# Patient Record
Sex: Female | Born: 1965
Health system: Southern US, Community
[De-identification: ages and names within clinical notes are randomized; demographics above are authoritative.]

## PROBLEM LIST (undated history)

## (undated) ENCOUNTER — Encounter: Attending: Hematology & Oncology | Primary: Hematology & Oncology

## (undated) ENCOUNTER — Encounter

## (undated) ENCOUNTER — Encounter: Attending: Internal Medicine | Primary: Internal Medicine

## (undated) ENCOUNTER — Encounter: Attending: Obstetrics & Gynecology | Primary: Obstetrics & Gynecology

## (undated) ENCOUNTER — Telehealth

## (undated) ENCOUNTER — Encounter: Attending: Rheumatology | Primary: Rheumatology

## (undated) ENCOUNTER — Telehealth: Attending: Hematology & Oncology | Primary: Hematology & Oncology

## (undated) ENCOUNTER — Telehealth: Attending: Obstetrics & Gynecology | Primary: Obstetrics & Gynecology

## (undated) ENCOUNTER — Encounter: Attending: Adult Health | Primary: Adult Health

## (undated) ENCOUNTER — Encounter: Payer: MEDICARE | Attending: Pharmacist | Primary: Pharmacist

## (undated) ENCOUNTER — Encounter: Attending: Cardiovascular Disease | Primary: Cardiovascular Disease

## (undated) ENCOUNTER — Telehealth: Attending: Adult Health | Primary: Adult Health

## (undated) ENCOUNTER — Ambulatory Visit: Payer: MEDICARE

## (undated) ENCOUNTER — Encounter: Payer: MEDICARE | Attending: Hematology & Oncology | Primary: Hematology & Oncology

## (undated) ENCOUNTER — Encounter: Attending: Pharmacist | Primary: Pharmacist

## (undated) ENCOUNTER — Encounter: Payer: MEDICARE | Attending: Internal Medicine | Primary: Internal Medicine

## (undated) ENCOUNTER — Telehealth: Attending: Rheumatology | Primary: Rheumatology

## (undated) ENCOUNTER — Ambulatory Visit

## (undated) ENCOUNTER — Encounter: Payer: MEDICARE | Attending: Adult Health | Primary: Adult Health

## (undated) ENCOUNTER — Ambulatory Visit: Payer: MEDICARE | Attending: Internal Medicine | Primary: Internal Medicine

## (undated) ENCOUNTER — Encounter: Payer: MEDICARE | Attending: Physical Medicine & Rehabilitation | Primary: Physical Medicine & Rehabilitation

## (undated) ENCOUNTER — Encounter: Attending: Oncology | Primary: Oncology

## (undated) ENCOUNTER — Ambulatory Visit: Attending: Obstetrics & Gynecology | Primary: Obstetrics & Gynecology

## (undated) ENCOUNTER — Encounter: Attending: Nurse Practitioner | Primary: Nurse Practitioner

## (undated) ENCOUNTER — Ambulatory Visit: Payer: MEDICARE | Attending: Hematology & Oncology | Primary: Hematology & Oncology

## (undated) ENCOUNTER — Encounter: Payer: MEDICARE | Attending: Obstetrics & Gynecology | Primary: Obstetrics & Gynecology

## (undated) ENCOUNTER — Encounter: Payer: MEDICARE | Attending: Cardiovascular Disease | Primary: Cardiovascular Disease

## (undated) ENCOUNTER — Telehealth: Attending: Pharmacist | Primary: Pharmacist

## (undated) DIAGNOSIS — E049 Nontoxic goiter, unspecified: Secondary | ICD-10-CM

## (undated) DIAGNOSIS — C9590 Leukemia, unspecified not having achieved remission: Secondary | ICD-10-CM

## (undated) DIAGNOSIS — Z8619 Personal history of other infectious and parasitic diseases: Secondary | ICD-10-CM

## (undated) DIAGNOSIS — K219 Gastro-esophageal reflux disease without esophagitis: Secondary | ICD-10-CM

## (undated) DIAGNOSIS — K279 Peptic ulcer, site unspecified, unspecified as acute or chronic, without hemorrhage or perforation: Secondary | ICD-10-CM

## (undated) DIAGNOSIS — M329 Systemic lupus erythematosus, unspecified: Secondary | ICD-10-CM

## (undated) DIAGNOSIS — L409 Psoriasis, unspecified: Secondary | ICD-10-CM

## (undated) DIAGNOSIS — E119 Type 2 diabetes mellitus without complications: Secondary | ICD-10-CM

## (undated) DIAGNOSIS — M8580 Other specified disorders of bone density and structure, unspecified site: Secondary | ICD-10-CM

## (undated) DIAGNOSIS — C9210 Chronic myeloid leukemia, BCR/ABL-positive, not having achieved remission: Secondary | ICD-10-CM

## (undated) DIAGNOSIS — I10 Essential (primary) hypertension: Secondary | ICD-10-CM

## (undated) DIAGNOSIS — M069 Rheumatoid arthritis, unspecified: Secondary | ICD-10-CM

## (undated) HISTORY — DX: Psoriasis, unspecified: L40.9

## (undated) HISTORY — PX: BOTOX INJECTION: SHX5754

## (undated) HISTORY — DX: Rheumatoid arthritis, unspecified: M06.9

## (undated) HISTORY — DX: Type 2 diabetes mellitus without complications: E11.9

## (undated) HISTORY — DX: Nontoxic goiter, unspecified: E04.9

## (undated) HISTORY — DX: Systemic lupus erythematosus, unspecified: M32.9

## (undated) HISTORY — DX: Chronic myeloid leukemia, BCR/ABL-positive, not having achieved remission: C92.10

## (undated) HISTORY — DX: Other specified disorders of bone density and structure, unspecified site: M85.80

## (undated) HISTORY — DX: Gastro-esophageal reflux disease without esophagitis: K21.9

## (undated) HISTORY — DX: Essential (primary) hypertension: I10

## (undated) HISTORY — DX: Peptic ulcer, site unspecified, unspecified as acute or chronic, without hemorrhage or perforation: K27.9

## (undated) HISTORY — DX: Personal history of other infectious and parasitic diseases: Z86.19

## (undated) MED ORDER — CONJUGATED ESTROGENS 0.625 MG/GRAM VAGINAL CREAM: VAGINAL | 0 days | Status: SS

## (undated) MED ORDER — DOXYCYCLINE MONOHYDRATE 50 MG CAPSULE: Freq: Two times a day (BID) | ORAL | 0 days | Status: SS

## (undated) MED ORDER — TACROLIMUS 0.03 % TOPICAL OINTMENT: Freq: Two times a day (BID) | TOPICAL | 0 days | Status: SS

## (undated) MED ORDER — TRAMADOL 50 MG TABLET: Freq: Four times a day (QID) | ORAL | 0 days | Status: SS

## (undated) MED ORDER — KETOCONAZOLE 2 % SHAMPOO: TOPICAL | 0 days | Status: SS

## (undated) MED ORDER — TRIAMCINOLONE ACETONIDE 0.1 % TOPICAL OINTMENT: Freq: Two times a day (BID) | TOPICAL | 0 days | Status: SS

## (undated) MED ORDER — MEDROXYPROGESTERONE 10 MG TABLET: Freq: Every day | ORAL | 0 days | Status: SS

## (undated) MED ORDER — ALBUTEROL SULFATE HFA 90 MCG/ACTUATION AEROSOL INHALER: Freq: Four times a day (QID) | RESPIRATORY_TRACT | 0 days | Status: SS | PRN

---
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
## 1988-02-21 DIAGNOSIS — Z8619 Personal history of other infectious and parasitic diseases: Secondary | ICD-10-CM

## 1988-02-21 HISTORY — DX: Personal history of other infectious and parasitic diseases: Z86.19

## 2005-04-13 ENCOUNTER — Emergency Department: Payer: Self-pay | Admitting: Emergency Medicine

## 2005-04-14 ENCOUNTER — Other Ambulatory Visit: Payer: Self-pay

## 2006-02-20 HISTORY — PX: MYOMECTOMY: SHX85

## 2006-05-07 ENCOUNTER — Ambulatory Visit: Payer: Self-pay | Admitting: Unknown Physician Specialty

## 2006-05-10 ENCOUNTER — Ambulatory Visit: Payer: Self-pay | Admitting: Unknown Physician Specialty

## 2008-02-21 HISTORY — PX: OTHER SURGICAL HISTORY: SHX169

## 2008-05-31 ENCOUNTER — Emergency Department: Payer: Self-pay | Admitting: Emergency Medicine

## 2008-07-03 LAB — HM DEXA SCAN

## 2008-07-03 LAB — HM MAMMOGRAPHY

## 2008-11-25 ENCOUNTER — Emergency Department: Payer: Self-pay | Admitting: Emergency Medicine

## 2008-12-02 ENCOUNTER — Emergency Department: Payer: Self-pay

## 2008-12-29 ENCOUNTER — Ambulatory Visit: Payer: Self-pay | Admitting: Family Medicine

## 2009-02-20 DIAGNOSIS — C9210 Chronic myeloid leukemia, BCR/ABL-positive, not having achieved remission: Secondary | ICD-10-CM

## 2009-02-20 HISTORY — PX: OTHER SURGICAL HISTORY: SHX169

## 2009-02-20 HISTORY — DX: Chronic myeloid leukemia, BCR/ABL-positive, not having achieved remission: C92.10

## 2009-09-20 DIAGNOSIS — M329 Systemic lupus erythematosus, unspecified: Secondary | ICD-10-CM

## 2009-09-20 HISTORY — DX: Systemic lupus erythematosus, unspecified: M32.9

## 2009-10-14 ENCOUNTER — Encounter: Payer: Self-pay | Admitting: Family Medicine

## 2009-10-14 LAB — CONVERTED CEMR LAB
ALT: 28 units/L
AST: 24 units/L
Alkaline Phosphatase: 68 units/L
BUN: 7 mg/dL
CO2: 25 meq/L
Chloride: 112 meq/L
Creatinine, Ser: 0.87 mg/dL
HCT: 43.2 %
Hemoglobin: 13.6 g/dL
MCV: 74 fL
Platelets: 782 10*3/uL
Potassium: 5.1 meq/L
RBC: 5.81 M/uL
Sodium: 144 meq/L
Vit D, 25-Hydroxy: 35 ng/mL
WBC: 13.7 10*3/uL

## 2009-11-20 ENCOUNTER — Ambulatory Visit: Payer: Self-pay | Admitting: Internal Medicine

## 2009-12-16 ENCOUNTER — Ambulatory Visit: Payer: Self-pay | Admitting: Oncology

## 2009-12-21 ENCOUNTER — Ambulatory Visit: Payer: Self-pay | Admitting: Internal Medicine

## 2009-12-21 ENCOUNTER — Ambulatory Visit: Payer: Self-pay | Admitting: Oncology

## 2009-12-24 ENCOUNTER — Emergency Department: Payer: Self-pay | Admitting: Emergency Medicine

## 2010-01-20 ENCOUNTER — Ambulatory Visit: Payer: Self-pay | Admitting: Oncology

## 2010-01-20 ENCOUNTER — Ambulatory Visit: Payer: Self-pay | Admitting: Internal Medicine

## 2010-02-09 ENCOUNTER — Encounter: Payer: Self-pay | Admitting: Family Medicine

## 2010-02-20 ENCOUNTER — Ambulatory Visit: Payer: Self-pay | Admitting: Internal Medicine

## 2010-02-20 ENCOUNTER — Ambulatory Visit: Payer: Self-pay | Admitting: Oncology

## 2010-02-20 HISTORY — PX: TRANSTHORACIC ECHOCARDIOGRAM: SHX275

## 2010-03-23 ENCOUNTER — Ambulatory Visit: Payer: Self-pay | Admitting: Oncology

## 2010-03-23 ENCOUNTER — Encounter: Payer: Self-pay | Admitting: Family Medicine

## 2010-03-23 ENCOUNTER — Ambulatory Visit: Payer: Self-pay | Admitting: Internal Medicine

## 2010-03-23 LAB — CONVERTED CEMR LAB
ALT: 49 units/L
AST: 19 units/L
Albumin: 3.6 g/dL
Alkaline Phosphatase: 98 units/L
BUN: 14 mg/dL
CO2: 22 meq/L
Calcium: 8.9 mg/dL
Chloride: 104 meq/L
Creatinine, Ser: 0.9 mg/dL
Glucose, Bld: 95 mg/dL
HCT: 39.8 %
Hemoglobin: 12.7 g/dL
MCV: 76 fL
Platelets: 472 10*3/uL
Potassium: 4.5 meq/L
Sodium: 139 meq/L
Total Bilirubin: 0.1 mg/dL
Total Protein: 7.2 g/dL
WBC: 9 10*3/uL

## 2010-03-24 ENCOUNTER — Encounter: Payer: Self-pay | Admitting: Family Medicine

## 2010-03-24 ENCOUNTER — Ambulatory Visit (INDEPENDENT_AMBULATORY_CARE_PROVIDER_SITE_OTHER): Payer: PRIVATE HEALTH INSURANCE | Admitting: Family Medicine

## 2010-03-24 DIAGNOSIS — M069 Rheumatoid arthritis, unspecified: Secondary | ICD-10-CM | POA: Insufficient documentation

## 2010-03-24 DIAGNOSIS — C9210 Chronic myeloid leukemia, BCR/ABL-positive, not having achieved remission: Secondary | ICD-10-CM | POA: Insufficient documentation

## 2010-03-24 DIAGNOSIS — J45909 Unspecified asthma, uncomplicated: Secondary | ICD-10-CM

## 2010-03-24 DIAGNOSIS — R143 Flatulence: Secondary | ICD-10-CM

## 2010-03-24 DIAGNOSIS — R142 Eructation: Secondary | ICD-10-CM

## 2010-03-24 DIAGNOSIS — M329 Systemic lupus erythematosus, unspecified: Secondary | ICD-10-CM | POA: Insufficient documentation

## 2010-03-24 DIAGNOSIS — R141 Gas pain: Secondary | ICD-10-CM

## 2010-03-24 DIAGNOSIS — K219 Gastro-esophageal reflux disease without esophagitis: Secondary | ICD-10-CM | POA: Insufficient documentation

## 2010-03-30 NOTE — Assessment & Plan Note (Signed)
Summary: NEW PT TO EST/45 MIN/CLE [facility] MAILED NPP   Vital Signs:  Patient profile:   45 year old female Height:      65 inches Weight:      228.25 pounds BMI:     38.12 Temp:     98.2 degrees F oral Pulse rate:   72 / minute Pulse rhythm:   regular BP sitting:   144 / 84  (left arm) Cuff size:   large  Vitals Entered By: Selena Batten Dance CMA Duncan Dull) (March 24, 2010 10:32 AM) CC: New patient to establish care   History of Present Illness: CC: new patient, establish  Dr. Burnett Sheng at [facility] previous PCP.  wanted switch 2/2 all issues she's going through with medical problems.  concern today - keeping gas/bloating, feel like bubbles.  Tums and protonix daily not helping.  Has tried gasx which didn't help.  wonders if could be due to dairy ashas recently increased milk consumption corerlates with gassiness.  h/o RA, lupus, and CML about to change to gleevec and on predisone.  SLE not well controlled currently.  Knee pain.  To see Rheum 2/23 to discuss furhter treatments.  saw onc yesterday, about to change tusigna to gleevec.    told HTN due to pain, not really HTNive.  Sees Rheum, [facility], GYN  Preventative: tetanus - thinks >10 years ago, unsure. flu shot - had at [facility] Pap - 06/2009 at GYN, had low risk HPV, rpt 1 year. mammo - normal 07/2009, gets yearly.  Current Medications (verified): 1)  Lybrel 90-20 Mcg Tabs (Levonorgestrel-Ethinyl Estrad) .Marland Kitchen.. 1 By Mouth Once Daily 2)  Amiloride Hcl 5 Mg Tabs (Amiloride Hcl) .... 10mg  By Mouth Once Daily 3)  Pantoprazole Sodium 40 Mg Tbec (Pantoprazole Sodium) .Marland Kitchen.. 1 By Mouth Once Daily 4)  Generlac 10 Gm/56ml Soln (Lactulose Encephalopathy) .Marland KitchenMarland KitchenMarland Kitchen 15-30 Ml By Mouth At Bedtime 5)  Prednisone 20 Mg Tabs (Prednisone) .Marland Kitchen.. 1 By Mouth Once Daily 6)  Oxycodone Hcl 5 Mg Tabs (Oxycodone Hcl) .Marland Kitchen.. 1 By Mouth Every 4-6 As Needed For Pain 7)  Prochlorperazine Maleate 10 Mg Tabs (Prochlorperazine Maleate) .Marland Kitchen.. 1 By Mouth For  Nausea 8)  Vitamin D 1000 Unit Tabs (Cholecalciferol) .Marland Kitchen.. 1 By Mouth Once Daily 9)  Calcium 1200 1200-1000 Mg-Unit Chew (Calcium Carbonate-Vit D-Min) .Marland Kitchen.. 1 By Mouth Once Daily 10)  Ventolin Hfa 108 (90 Base) Mcg/act Aers (Albuterol Sulfate) .... 2 Puffs Q4 Hours As Needed Sob  Allergies (verified): 1)  ! Sulfa  Past History:  Past Medical History: Rheumatoid arthritis (1990s) Lupus (09/2009) CML (11/2009) currently undergoing chemotherapy Asthma GERD/PUD Thyroid goiter h/o syphilis s/p treatment (1990)  Rheum - Dr. Higinio Plan Onc - Dr. Orlie Dakin [facility] GYN - Dr. Genice Rouge Endo - Dr. 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  The patient denies anorexia, weight loss, decreased hearing, hoarseness, chest pain, syncope, dyspnea on exertion, prolonged cough, headaches, hemoptysis, abdominal pain, melena, hematochezia, hematuria, depression, and breast masses.         low grade fever part of rheum conditions  per pt. to see eye doc today. just had echo done, told ok.  Physical Exam  General:  Well-developed,well-nourished,in no acute distress; alert,appropriate and cooperative throughout examination Head:  Normocephalic and atraumatic without obvious abnormalities. No apparent alopecia or balding. Eyes:  PERRLA, EOMI, no injection Ears:  TMs clear bilaterally Nose:  nares clear bilaterally Mouth:  MMM, no pharyngeal erythema/edema Neck:  No deformities, masses, or tenderness noted.  no LAD appreciated Lungs:  Normal respiratory effort, chest expands  symmetrically. Lungs are clear to auscultation, no crackles or wheezes. Heart:  Normal rate and regular rhythm. S1 and S2 normal without gallop, murmur, click, rub or other extra sounds. Abdomen:  Bowel sounds positive,abdomen soft and non-tender without masses, organomegaly or hernias noted. Msk:  bilateral hands with boutonniere deformity, no warmth or pain of IPs Pulses:  2+ rad pulses Extremities:  no pedal edema Neurologic:  CN grossly intact.  slowed and antalgic gait.  station intact Skin:  Intact without suspicious lesions or rashes Psych:  full affect, pleasant and cooperative   Impression & Recommendations:  Problem # 1:  FLATULENCE ERUCTATION AND GAS PAIN (ICD-787.3) could very well be dairy products.  rec try beano first, if not better may need to slowly decrease amt dairy in diet.  already on calcium/vit D supplementation daily.  Problem # 2:  CML (ICD-205.10) requested records from previous provider as well as onc.  Problem # 3:  GERD (ICD-530.81)  continue current therapy. Her updated medication list for this problem includes:    Pantoprazole Sodium 40 Mg Tbec (Pantoprazole sodium) .Marland Kitchen... 1 by mouth once daily  Problem # 4:  ARTHRITIS, RHEUMATOID (ICD-714.0) requested records from rheum. Her updated medication list for this problem includes:    Prednisone 20 Mg Tabs (Prednisone) .Marland Kitchen... 1 by mouth once daily  Problem # 5:  SYSTEMIC LUPUS ERYTHEMATOSUS (ICD-710.0) requested records from rheum. Her updated medication list for this problem includes:    Prednisone 20 Mg Tabs (Prednisone) .Marland Kitchen... 1 by mouth once daily  Problem # 6:  ASTHMA (ICD-493.90) controlled on albuterol as needed, no controller med currently Her updated medication list for this problem includes:    Prednisone 20 Mg Tabs (Prednisone) .Marland Kitchen... 1 by mouth once daily    Ventolin Hfa 108 (90 Base) Mcg/act Aers (Albuterol sulfate) .Marland Kitchen... 2 puffs q4 hours as needed sob  Complete Medication List: 1)  Lybrel  90-20 Mcg Tabs (Levonorgestrel-ethinyl estrad) .Marland Kitchen.. 1 by mouth once daily 2)  Amiloride Hcl 5 Mg Tabs (Amiloride hcl) .... 10mg  by mouth once daily 3)  Pantoprazole Sodium 40 Mg Tbec (Pantoprazole sodium) .Marland Kitchen.. 1 by mouth once daily 4)  Generlac 10 Gm/61ml Soln (Lactulose encephalopathy) .Marland KitchenMarland KitchenMarland Kitchen 15-30 ml by mouth at bedtime 5)  Prednisone 20 Mg Tabs (Prednisone) .Marland Kitchen.. 1 by mouth once daily 6)  Oxycodone Hcl 5 Mg Tabs (Oxycodone hcl) .Marland Kitchen.. 1 by mouth every 4-6 as needed for pain 7)  Prochlorperazine Maleate 10 Mg Tabs (Prochlorperazine maleate) .Marland Kitchen.. 1 by mouth for nausea 8)  Vitamin D 1000 Unit Tabs (Cholecalciferol) .Marland Kitchen.. 1 by mouth once daily 9)  Calcium 1200 1200-1000 Mg-unit Chew (Calcium carbonate-vit d-min) .Marland Kitchen.. 1 by mouth once daily 10)  Ventolin Hfa 108 (90 Base) Mcg/act Aers (Albuterol sulfate) .... 2 puffs q4 hours as needed sob  Patient Instructions: 1)  Try beano for bloating/gas.  may be milk and may need to back down on this. 2)  We will get records from other doctors to review. 3)  Return as needed or  in 1 year for complete physical. 4)  good to meet you today, call clinic with questions.   Orders Added: 1)  New Patient Level III [04540]    Current Allergies (reviewed today): ! SULFA

## 2010-04-10 ENCOUNTER — Encounter: Payer: Self-pay | Admitting: Family Medicine

## 2010-04-19 NOTE — Miscellaneous (Signed)
Summary: new pt input  Clinical Lists Changes  Observations: Added new observation of PAST SURG HX: Fibroid removed 2007  echo - normal systolic/diastolic fxn, EF 04%, mild MR, normal PA pressures 02/2010 (04/10/2010 13:37) Added new observation of PLATELETK/UL: 472 K/uL (03/23/2010 13:37) Added new observation of MCV: 76 fL (03/23/2010 13:37) Added new observation of HCT: 39.8 % (03/23/2010 13:37) Added new observation of HGB: 12.7 g/dL (54/10/8117 14:78) Added new observation of WBC COUNT: 9.0 10*3/microliter (03/23/2010 13:37) Added new observation of CALCIUM: 8.9 mg/dL (29/56/2130 86:57) Added new observation of ALBUMIN: 3.6 g/dL (84/69/6295 28:41) Added new observation of PROTEIN, TOT: 7.2 g/dL (32/44/0102 72:53) Added new observation of SGPT (ALT): 49 units/L (03/23/2010 13:37) Added new observation of SGOT (AST): 19 units/L (03/23/2010 13:37) Added new observation of ALK PHOS: 98 units/L (03/23/2010 13:37) Added new observation of BILI TOTAL: 0.1 mg/dL (66/44/0347 42:59) Added new observation of CREATININE: 0.90 mg/dL (56/38/7564 33:29) Added new observation of BUN: 14 mg/dL (51/88/4166 06:30) Added new observation of BG RANDOM: 95 mg/dL (16/02/930 35:57) Added new observation of CO2 PLSM/SER: 22 meq/L (03/23/2010 13:37) Added new observation of CL SERUM: 104 meq/L (03/23/2010 13:37) Added new observation of K SERUM: 4.5 meq/L (03/23/2010 13:37) Added new observation of NA: 139 meq/L (03/23/2010 13:37)        Past History:  Past Surgical History: Fibroid removed 2007  echo - normal systolic/diastolic fxn, EF 32%, mild MR, normal PA pressures 02/2010

## 2010-04-21 ENCOUNTER — Ambulatory Visit: Payer: Self-pay | Admitting: Oncology

## 2010-04-21 ENCOUNTER — Ambulatory Visit: Payer: Self-pay | Admitting: Internal Medicine

## 2010-05-01 ENCOUNTER — Encounter: Payer: Self-pay | Admitting: Family Medicine

## 2010-05-11 DIAGNOSIS — M1710 Unilateral primary osteoarthritis, unspecified knee: Secondary | ICD-10-CM | POA: Insufficient documentation

## 2010-05-22 ENCOUNTER — Ambulatory Visit: Payer: Self-pay | Admitting: Internal Medicine

## 2010-05-22 ENCOUNTER — Ambulatory Visit: Payer: Self-pay | Admitting: Oncology

## 2010-05-22 HISTORY — PX: OTHER SURGICAL HISTORY: SHX169

## 2010-05-31 ENCOUNTER — Encounter: Payer: Self-pay | Admitting: Family Medicine

## 2010-05-31 LAB — LIPID PANEL
Cholesterol, Total: 187
HDL: 71 mg/dL — AB (ref 35–70)
LDL Cholesterol: 103 mg/dL
Triglycerides: 66

## 2010-06-01 ENCOUNTER — Encounter: Payer: Self-pay | Admitting: Family Medicine

## 2010-06-06 ENCOUNTER — Encounter: Payer: Self-pay | Admitting: Family Medicine

## 2010-06-07 ENCOUNTER — Encounter: Payer: Self-pay | Admitting: Family Medicine

## 2010-06-07 ENCOUNTER — Ambulatory Visit (INDEPENDENT_AMBULATORY_CARE_PROVIDER_SITE_OTHER): Payer: PRIVATE HEALTH INSURANCE | Admitting: Family Medicine

## 2010-06-07 DIAGNOSIS — F17200 Nicotine dependence, unspecified, uncomplicated: Secondary | ICD-10-CM | POA: Insufficient documentation

## 2010-06-07 DIAGNOSIS — Z720 Tobacco use: Secondary | ICD-10-CM | POA: Insufficient documentation

## 2010-06-07 DIAGNOSIS — R21 Rash and other nonspecific skin eruption: Secondary | ICD-10-CM

## 2010-06-07 DIAGNOSIS — R609 Edema, unspecified: Secondary | ICD-10-CM

## 2010-06-07 MED ORDER — CLOTRIMAZOLE 1 % EX CREA: TOPICAL_CREAM | Freq: Two times a day (BID) | CUTANEOUS | Status: DC

## 2010-06-07 MED ORDER — AMILORIDE HCL 5 MG PO TABS: 5.0000 mg | ORAL_TABLET | Freq: Two times a day (BID) | ORAL | Status: DC

## 2010-06-07 NOTE — Assessment & Plan Note (Signed)
Not interested in chantix, wellbutrin currently. Nicotine gum caused cough. Discussed sandwich bag method, pt seems motivated ot try this.

## 2010-06-07 NOTE — Progress Notes (Signed)
  Subjective:    Patient ID: Laura Chen, female    DOB: 1965/05/15, 45 y.o.   MRN: [id number]  HPI CC: here for 2 concerns.  1. Amiloride not working, takes twice daily.  States voiding equally with or without.  Feels like is retaining fluid all over, especially around middle.  Notes chnages 5 lbs in one day.  No SOB, leg swelling, chest pain/tightness, HA, vision changes, dizziness.  2. Skin spot check - bilateral abdomen, noticed for several weeks.  Dr. Sueanne Margarita unsure what it is.  Also separate rash on abdomen peeling which is improving on 50mg  prednisone.  Recently treated with diflucan x1.  Recent increase in prednisone to 50mg .  3. Smoking - 1/4 ppd.  Doesn't want chantix, doesn't want wellbutrin.  Has tried gum - made her cough.  Has not tried patch or lozenge.  Feels depressed.  Has put on significant amout of weight.    Stopped gleevec because of side effects.  Medications and allergies reviewed and updated as above. PMHx reviewed for relevance.  Review of Systems Per HPI    Objective:   Physical Exam  Constitutional: She appears well-developed and well-nourished. No distress.  HENT:  Head: Normocephalic and atraumatic.  Mouth/Throat: Oropharynx is clear and moist. No oropharyngeal exudate.  Eyes: Conjunctivae are normal. Pupils are equal, round, and reactive to light. No scleral icterus.  Neck: Normal range of motion. Neck supple.  Cardiovascular: Normal rate, regular rhythm, normal heart sounds and intact distal pulses.   No murmur heard. Pulmonary/Chest: Effort normal and breath sounds normal. No respiratory distress. She has no wheezes. She has no rales.  Abdominal: Soft. Bowel sounds are normal. She exhibits no distension and no mass. There is no tenderness. There is no rebound and no guarding.  Musculoskeletal: She exhibits no edema (no pedal edema bilaterally).  Lymphadenopathy:    She has no cervical adenopathy.  Skin: Skin is warm and dry. Rash (scaly rash mid  abdomen at beltline.  2nd rash on bilateral upper abdomen, annular about 2cm diameter, raised papules at  periphery and central clearing, mild scale) noted.          Assessment & Plan:

## 2010-06-07 NOTE — Assessment & Plan Note (Signed)
Seem like 2 separate rashes. One on upper abdomen possible tinea corporis, treat with clotrimazole (recently had diflucan x 1, should have helped). If not better, let us know.

## 2010-06-07 NOTE — Assessment & Plan Note (Signed)
Actually seems more due to obesity as no significant edema on exam - no pedal edema, no UE or periorbital edema. Discussed low salt, increase water. No change to diuretic currently (amiloride because sulfa allergy so no HCTZ).  Discussed lasix option, would want to hold off on this med for now, consider prn dosing in future.

## 2010-06-07 NOTE — Patient Instructions (Addendum)
Push water and try to limit salt. Try to incorporate more walking into the routine. If not getting better, let me know. Try cream for skin rash on abdomen, may be a bit of ringworm. Try sandwich bag method for cutting back slowly on smoking. Good to see you today.  Call us with questions.

## 2010-06-21 ENCOUNTER — Ambulatory Visit: Payer: Self-pay | Admitting: Oncology

## 2010-06-21 ENCOUNTER — Ambulatory Visit: Payer: Self-pay | Admitting: Internal Medicine

## 2010-06-23 ENCOUNTER — Encounter: Payer: Self-pay | Admitting: Family Medicine

## 2010-06-23 ENCOUNTER — Ambulatory Visit (INDEPENDENT_AMBULATORY_CARE_PROVIDER_SITE_OTHER): Payer: PRIVATE HEALTH INSURANCE | Admitting: Family Medicine

## 2010-06-23 DIAGNOSIS — R5383 Other fatigue: Secondary | ICD-10-CM

## 2010-06-23 DIAGNOSIS — R21 Rash and other nonspecific skin eruption: Secondary | ICD-10-CM

## 2010-06-23 DIAGNOSIS — C9210 Chronic myeloid leukemia, BCR/ABL-positive, not having achieved remission: Secondary | ICD-10-CM

## 2010-06-23 DIAGNOSIS — M791 Myalgia, unspecified site: Secondary | ICD-10-CM

## 2010-06-23 DIAGNOSIS — L299 Pruritus, unspecified: Secondary | ICD-10-CM

## 2010-06-23 DIAGNOSIS — IMO0001 Reserved for inherently not codable concepts without codable children: Secondary | ICD-10-CM

## 2010-06-23 DIAGNOSIS — R5381 Other malaise: Secondary | ICD-10-CM

## 2010-06-23 LAB — COMPREHENSIVE METABOLIC PANEL
ALT: 28 U/L (ref 0–35)
AST: 24 U/L (ref 0–37)
Albumin: 3.7 g/dL (ref 3.5–5.2)
Alkaline Phosphatase: 63 U/L (ref 39–117)
BUN: 10 mg/dL (ref 6–23)
CO2: 23 mEq/L (ref 19–32)
Calcium: 8.9 mg/dL (ref 8.4–10.5)
Chloride: 105 mEq/L (ref 96–112)
Creatinine, Ser: 0.8 mg/dL (ref 0.4–1.2)
GFR: 103.85 mL/min (ref >60.00)
Glucose, Bld: 111 mg/dL — ABNORMAL HIGH (ref 70–99)
Potassium: 4.5 mEq/L (ref 3.5–5.1)
Sodium: 138 mEq/L (ref 135–145)
Total Bilirubin: 0.2 mg/dL — ABNORMAL LOW (ref 0.3–1.2)
Total Protein: 6.5 g/dL (ref 6.0–8.3)

## 2010-06-23 LAB — CBC WITH DIFFERENTIAL/PLATELET
Basophils Absolute: 0 10*3/uL (ref 0.0–0.1)
Basophils Relative: 0.3 % (ref 0.0–3.0)
Eosinophils Absolute: 0 10*3/uL (ref 0.0–0.7)
Eosinophils Relative: 0.5 % (ref 0.0–5.0)
HCT: 35 % — ABNORMAL LOW (ref 36.0–46.0)
Hemoglobin: 11.5 g/dL — ABNORMAL LOW (ref 12.0–15.0)
Lymphocytes Relative: 14.1 % (ref 12.0–46.0)
Lymphs Abs: 1.2 10*3/uL (ref 0.7–4.0)
MCHC: 32.8 g/dL (ref 30.0–36.0)
MCV: 78.4 fl (ref 78.0–100.0)
Monocytes Absolute: 0.1 10*3/uL (ref 0.1–1.0)
Monocytes Relative: 1.6 % — ABNORMAL LOW (ref 3.0–12.0)
Neutro Abs: 6.9 10*3/uL (ref 1.4–7.7)
Neutrophils Relative %: 83.5 % — ABNORMAL HIGH (ref 43.0–77.0)
Platelets: 356 10*3/uL (ref 150.0–400.0)
RBC: 4.47 Mil/uL (ref 3.87–5.11)
RDW: 23.9 % — ABNORMAL HIGH (ref 11.5–14.6)
WBC: 8.2 10*3/uL (ref 4.5–10.5)

## 2010-06-23 LAB — TSH: TSH: 0.44 u[IU]/mL (ref 0.35–5.50)

## 2010-06-23 LAB — VITAMIN B12: Vitamin B-12: 452 pg/mL (ref 211–911)

## 2010-06-23 LAB — CK: Total CK: 140 U/L (ref 7–177)

## 2010-06-23 MED ORDER — CLOTRIMAZOLE-BETAMETHASONE 1-0.05 % EX CREA: TOPICAL_CREAM | Freq: Two times a day (BID) | CUTANEOUS | Status: DC

## 2010-06-23 NOTE — Assessment & Plan Note (Signed)
Doubt due to lyme disease as no noted tick per pt report, occurred 8 months ago. Check titers, if positive could be accounting for some of her sxs, would treat w doxy x 21d. Check other blood work as well.

## 2010-06-23 NOTE — Patient Instructions (Signed)
For skin rash - try lotrisone twice daily for 2 wks.  Give me an update if cream not helping skin rash.  For hands - continue moisturizing with curelle. I don't think you have lyme disease.   Blood work today to check on things. We will call you with results of blood work.

## 2010-06-23 NOTE — Progress Notes (Signed)
  Subjective:    Patient ID: Laura Chen, female    DOB: 1965/06/03, 45 y.o.   MRN: [id number]  HPI CC: check tick bite.  Last year 10/2009 thinks had tick bite.  Happened R posterior leg then lesion developed with rash around bite.  Actually only noticed red spot after itching/burning started.  No tick seen.  Co worker said looked like tick bite.    Worried about Lyme disease because since then doesn't feel well.  Gets fatigue spells.  Happening more frequently.  Previously about 2-3 x/mo, now 1-2 x/wk.  From awakening to bedtime (which is earlier than usual) feels very fatigued.  Episodes last from a few hours to 1 day.    Not due to gleevec because hasn't been on it in over a month.  Feeling nauseated every morning.  On loestrin daily.  + chills (99.9).  + joint pain.  + muscle pain (new).  + HA (more frequently).  + body itching throughout.  No fevers, abd pain.  No recent tick bites.  Thinks March blood work done at onc, told looking ok.  Skin rash - present for several months now.  On prednisone for years, but recently went down to 10mg  (05/2010) for RA and SLE (was up to 50mg  a few months back which seemed to help rash).  Rash noticed on back, underarms, patches on abd and dry skin on hands.  Initially treated with 2 wk course clotrimazole as though some tinea corporis, and abd rash initially scaly, now just hyperpigmented.  Continues pruritic.  Medications and allergies reviewed and updated as above. PMHx reviewed for relevance.   Review of Systems Per HPI    Objective:   Physical Exam  Constitutional: She appears well-developed and well-nourished. No distress.  HENT:  Head: Normocephalic and atraumatic.  Mouth/Throat: Oropharynx is clear and moist. No oropharyngeal exudate.  Eyes: Conjunctivae and EOM are normal. Pupils are equal, round, and reactive to light. No scleral icterus.  Neck: Normal range of motion. Neck supple.  Cardiovascular: Normal rate, regular rhythm, normal  heart sounds and intact distal pulses.   No murmur heard. Pulmonary/Chest: Effort normal and breath sounds normal. No respiratory distress. She has no wheezes. She has no rales.  Lymphadenopathy:    She has no cervical adenopathy.  Skin: Skin is warm and dry.       Dry patches dorsal hands at MCP joints and some on interdigital webs, hyperpigmented erythematous rash underarms and inferior to breasts, hyperpigmented pruritic macule R abdomen (no longer scaly), back with central hyperpigmented pruritic macule.  All are pruritic.  R posterior calf with nevus, no rash, no evidence of bite (months ago).  Psychiatric: She has a normal mood and affect.          Assessment & Plan:

## 2010-06-23 NOTE — Assessment & Plan Note (Addendum)
Below breasts pruritic, consistent with intertrigo, treat with lotrisone for 2 wk max, update me if not improved for referral to derm. On hands ? eczema although as pt on oral steroids would expect improvement.  Continue moisturizer, curelle.

## 2010-06-24 LAB — B. BURGDORFI ANTIBODIES: B burgdorferi Ab IgG+IgM: 0.39 {ISR}

## 2010-06-30 LAB — COMPREHENSIVE METABOLIC PANEL
ALT: 34 U/L (ref 7–35)
AST: 25 U/L
Alkaline Phosphatase: 98 U/L
CO2: 24 mmol/L
Creat: 0.74
Potassium: 4.9 mmol/L

## 2010-06-30 LAB — CBC AND DIFFERENTIAL
Hemoglobin: 11.6 g/dL — AB (ref 12.0–16.0)
WBC: 10.5
platelet count: 383

## 2010-06-30 LAB — VITAMIN D 25 HYDROXY (VIT D DEFICIENCY, FRACTURES): Vit D, 25-Hydroxy: 24

## 2010-06-30 LAB — C-REACTIVE PROTEIN: CRP: 4.7 mg/dL

## 2010-07-06 LAB — LACTATE DEHYDROGENASE: LDH: 893 U/L — AB (ref 80–250)

## 2010-07-22 ENCOUNTER — Ambulatory Visit: Payer: Self-pay | Admitting: Oncology

## 2010-07-22 ENCOUNTER — Ambulatory Visit: Payer: Self-pay | Admitting: Internal Medicine

## 2010-08-21 ENCOUNTER — Ambulatory Visit: Payer: Self-pay | Admitting: Oncology

## 2010-08-21 ENCOUNTER — Ambulatory Visit: Payer: Self-pay | Admitting: Internal Medicine

## 2010-08-29 ENCOUNTER — Emergency Department: Payer: Self-pay | Admitting: Emergency Medicine

## 2010-10-06 ENCOUNTER — Ambulatory Visit: Payer: Self-pay | Admitting: Oncology

## 2010-10-11 LAB — CBC AND DIFFERENTIAL
HCT: 28 %
Hemoglobin: 9.3 g/dL — AB (ref 12.0–16.0)
MCV: 81 fL
RDW: 18.9
WBC: 4.1
platelet count: 106

## 2010-10-11 LAB — COMPREHENSIVE METABOLIC PANEL
ALT: 58 U/L — AB (ref 7–35)
AST: 25 U/L
Albumin: 3.3
Alkaline Phosphatase: 73 U/L
BUN: 6 mg/dL (ref 4–21)
CO2: 25 mmol/L
Calcium: 8 mg/dL
Chloride: 106 mmol/L
Creat: 0.83
Glucose: 150
Potassium: 3.8 mmol/L
Sodium: 140 mmol/L (ref 137–147)
Total Bilirubin: 0.1 mg/dL
Total Protein: 6.8 g/dL

## 2010-10-12 ENCOUNTER — Encounter: Payer: Self-pay | Admitting: Family Medicine

## 2010-10-12 ENCOUNTER — Ambulatory Visit (INDEPENDENT_AMBULATORY_CARE_PROVIDER_SITE_OTHER): Payer: PRIVATE HEALTH INSURANCE | Admitting: Family Medicine

## 2010-10-12 ENCOUNTER — Ambulatory Visit (INDEPENDENT_AMBULATORY_CARE_PROVIDER_SITE_OTHER)
Admission: RE | Admit: 2010-10-12 | Discharge: 2010-10-12 | Disposition: A | Discharge status: Home / Self Care | Payer: PRIVATE HEALTH INSURANCE | Source: Ambulatory Visit | Attending: Family Medicine | Admitting: Family Medicine

## 2010-10-12 VITALS — BP 132/80 | HR 84 | Temp 98.3°F | Wt 235.0 lb

## 2010-10-12 DIAGNOSIS — R109 Unspecified abdominal pain: Secondary | ICD-10-CM

## 2010-10-12 LAB — POCT URINALYSIS DIPSTICK
Bilirubin, UA: NEGATIVE
Glucose, UA: NEGATIVE
Ketones, UA: NEGATIVE
Leukocytes, UA: NEGATIVE
Nitrite, UA: NEGATIVE
Protein, UA: NEGATIVE
Spec Grav, UA: 1.01
Urobilinogen, UA: 0.2
pH, UA: 6

## 2010-10-12 MED ORDER — TAMSULOSIN HCL 0.4 MG PO CAPS: 0.4000 mg | ORAL_CAPSULE | Freq: Every day | ORAL | Status: DC

## 2010-10-12 NOTE — Patient Instructions (Addendum)
I'd like to get records of blood work done yesterday - we will try and obtain.  After I review, may want more blood work or will contact you with what I think. Urine showing some blood.  I would like you to return in 2 weeks to recheck urine.  If not resolved, may set you up with urologist. Will treat for now as possible kidney stone.  I have also sent urine culture to rule out infection. For possible stone - take flomax for 5-7 days, strain urine. Return sooner if getting worse.

## 2010-10-12 NOTE — Progress Notes (Signed)
  Subjective:    Patient ID: Laura Chen, female    DOB: 1965-10-12, 45 y.o.   MRN: [id number]  HPI CC: R sided pain for months, recently worsening.  Pain in flank as well as right side and shooting down into groin.  Sharp pulsating pain.  Started months ago, recently worse this past week.  Hasn't tried anything for this.  Staying well hydrated.  Denies hematuria, dysuria.  + urgency and frequency.  Had some chills initially.  No fevers/chills, nausea/vomiting.  Pain constant, not positional.  When sitting feels pressure.  At its worse 6/10, currently 4/10.  Never had kidney stones.  No dizziness or bleeding from anywhere.  On SpryCell for leukemia, new in last few months. Off OCPs, previously for irregular cycles, LMP 09/25/2009.  Off since 03/2010. No longer has OBGYN, would like to do paps here from now on.  H/o HPV in past, none currently.  Last pap 06/2010 normal, HPV not checked.  Brings copy. To see [facility] Sept 24th, Rheum tomorrow.  Had blood work done yesterday.  Review of Systems Per HPI    Objective:   Physical Exam  Nursing note and vitals reviewed. Constitutional: She appears well-developed and well-nourished. No distress.       Stiff movement from RA  HENT:  Head: Normocephalic and atraumatic.  Mouth/Throat: Oropharynx is clear and moist. No oropharyngeal exudate.  Eyes: Conjunctivae and EOM are normal. Pupils are equal, round, and reactive to light. No scleral icterus.  Neck: Normal range of motion. Neck supple.  Cardiovascular: Normal rate, regular rhythm and intact distal pulses.   Murmur (3/6 SEM) heard. Pulmonary/Chest: Effort normal and breath sounds normal. No respiratory distress. She has no wheezes. She has no rales.  Abdominal: Soft. Bowel sounds are normal. She exhibits no distension and no mass. There is tenderness (mild). There is CVA tenderness (mild R sided). There is no rebound and no guarding.       Mild R sided abd tenderness, no guarding    Musculoskeletal: She exhibits no edema.  Lymphadenopathy:    She has no cervical adenopathy.  Skin: Skin is warm and dry. No rash noted.  Psychiatric: She has a normal mood and affect.          Assessment & Plan:

## 2010-10-12 NOTE — Assessment & Plan Note (Signed)
UA with blood, micro with 3-5 RBC/hpf. ?kidney stone, although atypical if going on for several months now. Will treat as such regardless with continued mobic, oxycodone prn, and add flomax for voiding.  Provided with strainer today. If not improving, return sooner otherwise return in 2 wks for rpt UA to ensure microhematuria resolved. Not positional, doubt MSK.  No RUQ pain.  Doubt hematoma but want to check Hgb. I will review blood work from yesterday and then notify pt if I want any futher labs.  KUB today - no evidence of stone.

## 2010-10-13 ENCOUNTER — Encounter: Payer: Self-pay | Admitting: Family Medicine

## 2010-10-13 ENCOUNTER — Telehealth: Payer: Self-pay | Admitting: Family Medicine

## 2010-10-13 DIAGNOSIS — C9210 Chronic myeloid leukemia, BCR/ABL-positive, not having achieved remission: Secondary | ICD-10-CM

## 2010-10-13 DIAGNOSIS — R109 Unspecified abdominal pain: Secondary | ICD-10-CM

## 2010-10-13 LAB — CBC AND DIFFERENTIAL
HCT: 32 %
Hemoglobin: 9.8 g/dL — AB (ref 12.0–16.0)
MCV: 83 fL
RDW: 19
WBC: 3.8
platelet count: 111

## 2010-10-13 LAB — C3 AND C4
C3 Complement: 122
C4 Complement: 24

## 2010-10-13 LAB — COMPREHENSIVE METABOLIC PANEL
ALT: 50 U/L — AB (ref 7–35)
AST: 29 U/L
Alkaline Phosphatase: 89 U/L
BUN: 9 mg/dL (ref 4–21)
Creat: 0.77

## 2010-10-13 LAB — C-REACTIVE PROTEIN: CRP: 2.7 mg/dL

## 2010-10-13 LAB — SEDIMENTATION RATE: Sed Rate: 36 mm

## 2010-10-13 NOTE — Telephone Encounter (Signed)
CBC, CMP obtained. WBC normal, HGB down at 9.3 from 11.5 3 mo ago, plt down to 106 from 356.    Please call and notify I reviewed blood work from this week.  Normal kidneys, liver.  Ensure eating well as protein level was a bit low.  If fasting sugar too high. Blood counts (red cells and platelets) down some, white count normal.  Could be sprycel - how long has she been on this agent?  Will want to recheck CBC when returns for rpt UA to ensure not dropping lower. How is abd pain doing?  If any worsening in meantime to let me know.

## 2010-10-13 NOTE — Telephone Encounter (Signed)
Called patient and she was at her appointment at [facility]. She will call me back for results. She did ask that I fax results to Dr. Terie Purser at [facility]. Results faxed as requested.

## 2010-10-14 LAB — URINE CULTURE
Colony Count: NO GROWTH
Organism ID, Bacteria: NO GROWTH

## 2010-10-14 NOTE — Telephone Encounter (Signed)
Noted. Thanks.

## 2010-10-14 NOTE — Telephone Encounter (Signed)
Advised pt of results, she has been on sprycel since June.  She states the abd pain is about the same.  Appt for repeat UA has been scheduled.  She will call if abd pain worsens before that lab appt.

## 2010-10-22 ENCOUNTER — Ambulatory Visit: Payer: Self-pay | Admitting: Oncology

## 2010-10-27 ENCOUNTER — Ambulatory Visit (INDEPENDENT_AMBULATORY_CARE_PROVIDER_SITE_OTHER): Payer: PRIVATE HEALTH INSURANCE | Admitting: Family Medicine

## 2010-10-27 ENCOUNTER — Encounter: Payer: Self-pay | Admitting: Family Medicine

## 2010-10-27 DIAGNOSIS — H60399 Other infective otitis externa, unspecified ear: Secondary | ICD-10-CM

## 2010-10-27 DIAGNOSIS — R319 Hematuria, unspecified: Secondary | ICD-10-CM

## 2010-10-27 DIAGNOSIS — R109 Unspecified abdominal pain: Secondary | ICD-10-CM

## 2010-10-27 DIAGNOSIS — N840 Polyp of corpus uteri: Secondary | ICD-10-CM | POA: Insufficient documentation

## 2010-10-27 DIAGNOSIS — D649 Anemia, unspecified: Secondary | ICD-10-CM

## 2010-10-27 DIAGNOSIS — C9210 Chronic myeloid leukemia, BCR/ABL-positive, not having achieved remission: Secondary | ICD-10-CM

## 2010-10-27 DIAGNOSIS — N920 Excessive and frequent menstruation with regular cycle: Secondary | ICD-10-CM | POA: Insufficient documentation

## 2010-10-27 DIAGNOSIS — H6090 Unspecified otitis externa, unspecified ear: Secondary | ICD-10-CM

## 2010-10-27 LAB — POCT URINALYSIS DIPSTICK
Bilirubin, UA: NEGATIVE
Glucose, UA: NEGATIVE
Ketones, UA: NEGATIVE
Leukocytes, UA: NEGATIVE
Nitrite, UA: NEGATIVE
Protein, UA: NEGATIVE
Spec Grav, UA: 1.005
Urobilinogen, UA: 0.2
pH, UA: 7.5

## 2010-10-27 LAB — CBC WITH DIFFERENTIAL/PLATELET
HCT: 27.9 % — ABNORMAL LOW (ref 36.0–46.0)
Hemoglobin: 8.8 g/dL — ABNORMAL LOW (ref 12.0–15.0)
MCHC: 31.7 g/dL (ref 30.0–36.0)
MCV: 82 fl (ref 78.0–100.0)
Platelets: 134 10*3/uL — ABNORMAL LOW (ref 150.0–400.0)
RBC: 3.4 Mil/uL — ABNORMAL LOW (ref 3.87–5.11)
RDW: 21.6 % — ABNORMAL HIGH (ref 11.5–14.6)
WBC: 4.6 10*3/uL (ref 4.5–10.5)

## 2010-10-27 MED ORDER — HYDROCORTISONE-ACETIC ACID 1-2 % OT SOLN: 4.0000 [drp] | Freq: Three times a day (TID) | OTIC | Status: AC

## 2010-10-27 NOTE — Patient Instructions (Addendum)
Urine overall clear today.  Blood work today to check on red cell count. For left ear, could be mild outer ear infection.  Use vosol hc several times a day for 5-7 days.  If not better, let me know. Let me know results of CT scan.  If no explanation for irregular periods, we will want to set you up with local OBGYN to follow irregular periods. Good to see you today, call us with questions.

## 2010-10-27 NOTE — Progress Notes (Signed)
Subjective:    Patient ID: Laura Chen, female    DOB: 10/18/1965, 45 y.o.   MRN: [id number]  HPI CC: f/u R flank pain  Pleasant 44yo with h/o CML followed by Dr. Haydee Monica, Rheumatoid arthritis and SLE followed by Dr. Terie Purser [facility]), smoker presents for 2wk f/u.  Seen 8/22 with progressively worsening R flank pain.  UA with blood, question of kidney stone so treated with flomax, mobic and oxycodone prn, strainer.  Also reviewed blood work from rheum - normal WBC, RBC dropped from 11.5 to 9.3.  Did start sprycell 07/2010.  Presents today to rpt UA and f/u.  States R sided abd/back pain some improved, now intermittent.  Today not painful at all.  Never passed stone.  Some concern for hematoma but overall improved, no skin changes.  In interim, saw [facility] Dr. Terie Purser (Rheum).  Tapering off prednisone.  Has been on sprycel since June 2012.  Since July 20th, endorsing period every 2 wks.  No spotting or bleeding in between.  Last saw Dr. Barnabas Lister OBGYN 06/2010.  Pelvic US/HSG done 05/2010 showing endometrial polyp, plan was to treat with OCPs and reassess in 8 wks, but in interim OBGYN left. Stopped OCPs for several months now, never had rpt [place].  Has been set up with abd/pelvic CT scan at [facility], to have this done Saturday 10/29/2010. DEXA scan set up for January 31, 2011.  2010 with DEXA showing osteopenia (chronic daily steroids).  Denies noting blood in urine.  No fevers/chills.  Fatigue remains.  No lightheadedness or SOB.  Also endorses L ear pain - constant throbbing pain in ear started 1 wk ago.  No tinnitus, fevers, discharge.  + pruritis.  + more congestion than usual.  Started allegra D.  No hearing changes, muffled hearing.  Medications and allergies reviewed and updated in chart.  Past histories reviewed and updated if relevant as below. Patient Active Problem List  Diagnoses  . CML  . ASTHMA  . GERD  . SYSTEMIC LUPUS ERYTHEMATOSUS  . ARTHRITIS, RHEUMATOID  . FLATULENCE ERUCTATION AND  GAS PAIN  . Skin rash  . Edema  . Tobacco abuse  . Fatigue  . Right flank pain  . External otitis   Past Medical History  Diagnosis Date  . Rheumatoid arthritis 1990s    Jacoud's arthropathy, previously treated with MTX, TNFa (remicade, enbrel, humira, orencia, rituximab, and actemra)  . Hypertension   . CML (chronic myeloid leukemia) 2011    (Dr. Yisroel Ramming) stopped gleevec 2/2 side effects  . History of shingles   . Osteopenia     due to prednisone use, was on reclast  . SLE (systemic lupus erythematosus) 09/2009    Rheum-Dr. Higinio Plan  . Asthma   . GERD (gastroesophageal reflux disease)   . PUD (peptic ulcer disease)   . Thyroid goiter     Endo-Dr. Renae Fickle  . History of syphilis 1990    s/p treatment   Past Surgical History  Procedure Date  . Us/hsg 05/2010    possible endometrial polyp, rec rpt 8 wks continue loestrin 24 [facility])  . Myomectomy 2008  . Dexa 2010    osteopenia  . Right ankle afo 2011  . Transthoracic echocardiogram 02/2010    nl systolic/diastolic fxn, EF 11%, mild MR, normal PA pressures   History  Substance Use Topics  . Smoking status: Current Everyday Smoker -- 0.3 packs/day    Types: Cigarettes  . Smokeless tobacco: Not on file   Comment: 6 cigarettes/day  .  Alcohol Use: No   Family History  Problem Relation Age of Onset  . Hypertension Mother   . Hypertension      Aunt  . Heart attack Maternal Grandmother   . Coronary artery disease Maternal Grandmother    Allergies  Allergen Reactions  . Sulfonamide Derivatives     REACTION: Burns to feet   Current Outpatient Prescriptions on File Prior to Visit  Medication Sig Dispense Refill  . albuterol (VENTOLIN HFA) 108 (90 BASE) MCG/ACT inhaler Inhale 2 puffs into the lungs every 4 (four) hours as needed.        Marland Kitchen aMILoride (MIDAMOR) 5 MG tablet Take 1 tablet (5 mg total) by mouth 2 (two) times daily.  60 tablet  6  . Calcium Carbonate-Vit D-Min (CALCIUM 1200) 1200-1000 MG-UNIT CHEW  Chew 1 tablet by mouth daily.        . Certolizumab Pegol (CIMZIA Orwin) Inject 400 mg into the skin every 30 (thirty) days.        . clotrimazole-betamethasone (LOTRISONE) cream Apply topically 2 (two) times daily. For 2 wks.  30 g  0  . lactulose, encephalopathy, (GENERLAC) 10 GM/15ML SOLN Take 10 g by mouth at bedtime.        . meloxicam (MOBIC) 15 MG tablet Take 1 tablet by mouth Daily.      Marland Kitchen oxyCODONE (OXY IR/ROXICODONE) 5 MG immediate release tablet Take 5 mg by mouth every 4 (four) hours as needed.        . predniSONE (DELTASONE) 10 MG tablet Take 12.5 mg by mouth daily.       Marland Kitchen PRESCRIPTION MEDICATION 1 tablet daily. Spry-Cell 100 mg       . prochlorperazine (COMPAZINE) 10 MG tablet Take 10 mg by mouth every 6 (six) hours as needed.        . VOLTAREN 1 % GEL Apply 1 application topically Twice daily as needed.      . pantoprazole (PROTONIX) 40 MG tablet Take 40 mg by mouth daily.         Review of Systems per HPI    Objective:   Physical Exam  Nursing note and vitals reviewed. Constitutional: She appears well-developed and well-nourished. No distress.       Stiff movements from RA  HENT:  Head: Normocephalic and atraumatic.  Right Ear: Hearing, tympanic membrane, external ear and ear canal normal.  Left Ear: Hearing, tympanic membrane and external ear normal.  Nose: Nose normal. No mucosal edema.  Mouth/Throat: Uvula is midline, oropharynx is clear and moist and mucous membranes are normal. No oropharyngeal exudate, posterior oropharyngeal edema, posterior oropharyngeal erythema or tonsillar abscesses.       Slight erythema L ear canal  Eyes: Conjunctivae and EOM are normal. Pupils are equal, round, and reactive to light. No scleral icterus.  Neck: Normal range of motion. Neck supple.  Cardiovascular: Normal rate, regular rhythm and intact distal pulses.   Murmur (mild SEM) heard. Pulmonary/Chest: Effort normal and breath sounds normal. No respiratory distress. She has no  wheezes. She has no rales.  Abdominal: Soft. Bowel sounds are normal. She exhibits no distension. There is no hepatosplenomegaly. There is no tenderness. There is no rigidity, no rebound, no guarding, no CVA tenderness and negative Murphy's sign.  Musculoskeletal:       Swollen hand joints, swan neck deformities  Skin: Skin is warm and dry. No rash noted.  Psychiatric: She has a normal mood and affect.  Assessment & Plan:

## 2010-10-27 NOTE — Assessment & Plan Note (Addendum)
On [place] 05/2010.  Discussed how majority of these are benign, but good idea to follow. Due for rpt [place].  Has pelvic CT scheduled Sat.  Await results, likely refer to set up with OBGYN to follow.

## 2010-10-27 NOTE — Assessment & Plan Note (Signed)
Treat with vosol HC for 1 wk.  Update if not improving.  Mild erythema deep to external canal.

## 2010-10-27 NOTE — Assessment & Plan Note (Signed)
R flank pain overall improving, currently intermittent, today pain free.  Never passed stone. Has abd/pelvic CT scheduled for this weekend.  Await results of that. micro today with 0-3 RBC/hpf. Recheck CBC today to follow recently decreasing cell lines (? due to sprycell).  Hgb 11.5 (06/2010) -> 9.3 (09/2010).

## 2010-10-27 NOTE — Assessment & Plan Note (Addendum)
See above.  With endometrial polyp found on [place] 05/2010.  Await CT results, then likely refer to OBGYN. Started by OBGYN on OCPs, currently off.

## 2010-10-28 ENCOUNTER — Telehealth: Payer: Self-pay | Admitting: Family Medicine

## 2010-10-28 DIAGNOSIS — D649 Anemia, unspecified: Secondary | ICD-10-CM

## 2010-10-28 DIAGNOSIS — C9210 Chronic myeloid leukemia, BCR/ABL-positive, not having achieved remission: Secondary | ICD-10-CM

## 2010-10-28 DIAGNOSIS — M069 Rheumatoid arthritis, unspecified: Secondary | ICD-10-CM

## 2010-10-28 DIAGNOSIS — M329 Systemic lupus erythematosus, unspecified: Secondary | ICD-10-CM

## 2010-10-28 DIAGNOSIS — N920 Excessive and frequent menstruation with regular cycle: Secondary | ICD-10-CM | POA: Insufficient documentation

## 2010-10-28 DIAGNOSIS — N840 Polyp of corpus uteri: Secondary | ICD-10-CM

## 2010-10-28 NOTE — Telephone Encounter (Signed)
Patient notified. She hasn't noticed any other bleeding. Shirlee Limerick has already gotten GYN appt scheduled. Patient will come by next week and pick up iFOB kit. She will notify us if any dizziness, SOB, etc. Labs faxed as directed.

## 2010-10-28 NOTE — Telephone Encounter (Signed)
Please notify red cells returned lower at 8.8.  Other than periods, has she noted any other bleeding?  I wonder if coming from sprycell?  I would like to set up with OBGYN referral for polymenorrhea and to follow endometrial polyp, as well as please fax over results to Dr. Duane Lope (see phone note). To notify us if any dizziness, shortness of breath, or other indication of symptoms from low blood count. I would also like to set her up with iFOB to check for blood in stool (letter in system).

## 2010-11-21 ENCOUNTER — Ambulatory Visit: Payer: Self-pay | Admitting: Oncology

## 2010-11-22 DIAGNOSIS — M20029 Boutonniere deformity of unspecified finger(s): Secondary | ICD-10-CM | POA: Insufficient documentation

## 2010-11-22 DIAGNOSIS — M25439 Effusion, unspecified wrist: Secondary | ICD-10-CM | POA: Insufficient documentation

## 2010-11-22 DIAGNOSIS — M25549 Pain in joints of unspecified hand: Secondary | ICD-10-CM | POA: Insufficient documentation

## 2010-12-30 ENCOUNTER — Ambulatory Visit: Payer: Self-pay | Admitting: Oncology

## 2011-01-21 ENCOUNTER — Ambulatory Visit: Payer: Self-pay | Admitting: Oncology

## 2011-01-23 ENCOUNTER — Emergency Department: Payer: Self-pay | Admitting: Emergency Medicine

## 2011-02-21 ENCOUNTER — Ambulatory Visit: Payer: Self-pay | Admitting: Oncology

## 2011-04-10 ENCOUNTER — Other Ambulatory Visit: Payer: Self-pay | Admitting: *Deleted

## 2011-04-10 MED ORDER — AMILORIDE HCL 5 MG PO TABS: 5.0000 mg | ORAL_TABLET | Freq: Two times a day (BID) | ORAL | Status: DC

## 2011-05-05 ENCOUNTER — Ambulatory Visit: Payer: Self-pay | Admitting: Oncology

## 2011-05-05 LAB — CBC CANCER CENTER
Bands: 11 %
HCT: 33.2 % — ABNORMAL LOW (ref 35.0–47.0)
HGB: 10.7 g/dL — ABNORMAL LOW (ref 12.0–16.0)
Lymphocytes: 33 %
MCH: 25.8 pg — ABNORMAL LOW (ref 26.0–34.0)
MCHC: 32.3 g/dL (ref 32.0–36.0)
MCV: 80 fL (ref 80–100)
Metamyelocyte: 2 %
Monocytes: 14 %
Platelet: 164 x10 3/mm (ref 150–440)
RBC: 4.16 10*6/uL (ref 3.80–5.20)
RDW: 22.8 % — ABNORMAL HIGH (ref 11.5–14.5)
Segmented Neutrophils: 40 %
WBC: 5.2 x10 3/mm (ref 3.6–11.0)

## 2011-05-05 LAB — COMPREHENSIVE METABOLIC PANEL
Albumin: 3.2 g/dL — ABNORMAL LOW (ref 3.4–5.0)
Alkaline Phosphatase: 115 U/L (ref 50–136)
Anion Gap: 9 (ref 7–16)
BUN: 10 mg/dL (ref 7–18)
Bilirubin,Total: 0.1 mg/dL — ABNORMAL LOW (ref 0.2–1.0)
Calcium, Total: 8.7 mg/dL (ref 8.5–10.1)
Chloride: 105 mmol/L (ref 98–107)
Co2: 25 mmol/L (ref 21–32)
Creatinine: 0.84 mg/dL (ref 0.60–1.30)
EGFR (African American): >60
EGFR (Non-African Amer.): >60
Glucose: 79 mg/dL (ref 65–99)
Osmolality: 276 (ref 275–301)
Potassium: 4.4 mmol/L (ref 3.5–5.1)
SGOT(AST): 58 U/L — ABNORMAL HIGH (ref 15–37)
SGPT (ALT): 119 U/L — ABNORMAL HIGH
Sodium: 139 mmol/L (ref 136–145)
Total Protein: 7.3 g/dL (ref 6.4–8.2)

## 2011-05-05 LAB — MAGNESIUM: Magnesium: 2.2 mg/dL

## 2011-05-05 LAB — PHOSPHORUS: Phosphorus: 2.8 mg/dL (ref 2.5–4.9)

## 2011-05-22 ENCOUNTER — Ambulatory Visit: Payer: Self-pay | Admitting: Oncology

## 2011-07-20 ENCOUNTER — Ambulatory Visit: Payer: Self-pay | Admitting: Internal Medicine

## 2011-07-24 ENCOUNTER — Other Ambulatory Visit: Payer: Self-pay | Admitting: Family Medicine

## 2011-07-27 ENCOUNTER — Ambulatory Visit: Payer: Self-pay | Admitting: Internal Medicine

## 2011-08-04 ENCOUNTER — Ambulatory Visit: Payer: Self-pay | Admitting: Oncology

## 2011-08-04 LAB — COMPREHENSIVE METABOLIC PANEL
Albumin: 3.5 g/dL (ref 3.4–5.0)
Alkaline Phosphatase: 97 U/L (ref 50–136)
Anion Gap: 9 (ref 7–16)
BUN: 16 mg/dL (ref 7–18)
Bilirubin,Total: 0.2 mg/dL (ref 0.2–1.0)
Calcium, Total: 9.4 mg/dL (ref 8.5–10.1)
Chloride: 109 mmol/L — ABNORMAL HIGH (ref 98–107)
Co2: 23 mmol/L (ref 21–32)
Creatinine: 0.79 mg/dL (ref 0.60–1.30)
EGFR (African American): >60
EGFR (Non-African Amer.): >60
Glucose: 88 mg/dL (ref 65–99)
Osmolality: 282 (ref 275–301)
Potassium: 4.5 mmol/L (ref 3.5–5.1)
SGOT(AST): 22 U/L (ref 15–37)
SGPT (ALT): 41 U/L
Sodium: 141 mmol/L (ref 136–145)
Total Protein: 7.1 g/dL (ref 6.4–8.2)

## 2011-08-04 LAB — MAGNESIUM: Magnesium: 1.9 mg/dL

## 2011-08-05 LAB — CBC CANCER CENTER
Comment - H1-Com3: NORMAL
HCT: 35.7 % (ref 35.0–47.0)
HGB: 11 g/dL — ABNORMAL LOW (ref 12.0–16.0)
Lymphocytes: 31 %
MCH: 27 pg (ref 26.0–34.0)
MCHC: 30.7 g/dL — ABNORMAL LOW (ref 32.0–36.0)
MCV: 88 fL (ref 80–100)
Monocytes: 9 %
Myelocyte: 1 %
NRBC/100 WBC: 2 /100
Platelet: 171 x10 3/mm (ref 150–440)
RBC: 4.05 10*6/uL (ref 3.80–5.20)
RDW: 18.6 % — ABNORMAL HIGH (ref 11.5–14.5)
Segmented Neutrophils: 59 %
WBC: 6.5 x10 3/mm (ref 3.6–11.0)

## 2011-08-21 ENCOUNTER — Ambulatory Visit: Payer: Self-pay | Admitting: Oncology

## 2011-09-19 DIAGNOSIS — M75120 Complete rotator cuff tear or rupture of unspecified shoulder, not specified as traumatic: Secondary | ICD-10-CM | POA: Insufficient documentation

## 2011-10-01 ENCOUNTER — Emergency Department: Payer: Self-pay | Admitting: Emergency Medicine

## 2011-10-01 LAB — COMPREHENSIVE METABOLIC PANEL
Albumin: 3.2 g/dL — ABNORMAL LOW (ref 3.4–5.0)
Alkaline Phosphatase: 128 U/L (ref 50–136)
Anion Gap: 11 (ref 7–16)
BUN: 11 mg/dL (ref 7–18)
Bilirubin,Total: 0.2 mg/dL (ref 0.2–1.0)
Calcium, Total: 9.2 mg/dL (ref 8.5–10.1)
Chloride: 106 mmol/L (ref 98–107)
Co2: 24 mmol/L (ref 21–32)
Creatinine: 0.97 mg/dL (ref 0.60–1.30)
EGFR (African American): >60
EGFR (Non-African Amer.): >60
Glucose: 108 mg/dL — ABNORMAL HIGH (ref 65–99)
Osmolality: 281 (ref 275–301)
Potassium: 3.9 mmol/L (ref 3.5–5.1)
SGOT(AST): 37 U/L (ref 15–37)
SGPT (ALT): 70 U/L (ref 12–78)
Sodium: 141 mmol/L (ref 136–145)
Total Protein: 7.4 g/dL (ref 6.4–8.2)

## 2011-10-01 LAB — TROPONIN I: Troponin-I: <0.02 ng/mL

## 2011-10-01 LAB — CBC
HCT: 37.8 % (ref 35.0–47.0)
HGB: 11.7 g/dL — ABNORMAL LOW (ref 12.0–16.0)
MCH: 26 pg (ref 26.0–34.0)
MCHC: 31 g/dL — ABNORMAL LOW (ref 32.0–36.0)
MCV: 84 fL (ref 80–100)
Platelet: 334 10*3/uL (ref 150–440)
RBC: 4.49 10*6/uL (ref 3.80–5.20)
RDW: 16.7 % — ABNORMAL HIGH (ref 11.5–14.5)
WBC: 8.2 10*3/uL (ref 3.6–11.0)

## 2011-10-09 ENCOUNTER — Ambulatory Visit: Payer: Self-pay | Admitting: Oncology

## 2011-10-09 LAB — COMPREHENSIVE METABOLIC PANEL
Albumin: 3.2 g/dL — ABNORMAL LOW (ref 3.4–5.0)
Alkaline Phosphatase: 146 U/L — ABNORMAL HIGH (ref 50–136)
Anion Gap: 11 (ref 7–16)
BUN: 15 mg/dL (ref 7–18)
Bilirubin,Total: 0.2 mg/dL (ref 0.2–1.0)
Calcium, Total: 9.3 mg/dL (ref 8.5–10.1)
Chloride: 103 mmol/L (ref 98–107)
Co2: 23 mmol/L (ref 21–32)
Creatinine: 1.18 mg/dL (ref 0.60–1.30)
EGFR (African American): >60
EGFR (Non-African Amer.): 56 — ABNORMAL LOW
Glucose: 126 mg/dL — ABNORMAL HIGH (ref 65–99)
Osmolality: 276 (ref 275–301)
Potassium: 4.6 mmol/L (ref 3.5–5.1)
SGOT(AST): 31 U/L (ref 15–37)
SGPT (ALT): 77 U/L (ref 12–78)
Sodium: 137 mmol/L (ref 136–145)
Total Protein: 7.4 g/dL (ref 6.4–8.2)

## 2011-10-09 LAB — CBC CANCER CENTER
Bands: 2 %
Eosinophil: 1 %
HCT: 37.2 % (ref 35.0–47.0)
HGB: 11.2 g/dL — ABNORMAL LOW (ref 12.0–16.0)
Lymphocytes: 18 %
MCH: 25.2 pg — ABNORMAL LOW (ref 26.0–34.0)
MCHC: 30 g/dL — ABNORMAL LOW (ref 32.0–36.0)
MCV: 84 fL (ref 80–100)
Monocytes: 6 %
Platelet: 295 x10 3/mm (ref 150–440)
RBC: 4.44 10*6/uL (ref 3.80–5.20)
RDW: 16.6 % — ABNORMAL HIGH (ref 11.5–14.5)
Segmented Neutrophils: 72 %
Variant Lymphocyte: 1 %
WBC: 9.1 x10 3/mm (ref 3.6–11.0)

## 2011-10-22 ENCOUNTER — Ambulatory Visit: Payer: Self-pay | Admitting: Oncology

## 2012-01-30 ENCOUNTER — Ambulatory Visit: Payer: Self-pay | Admitting: Oncology

## 2012-01-30 LAB — COMPREHENSIVE METABOLIC PANEL
Albumin: 3.2 g/dL — ABNORMAL LOW (ref 3.4–5.0)
Alkaline Phosphatase: 109 U/L (ref 50–136)
Anion Gap: 9 (ref 7–16)
BUN: 11 mg/dL (ref 7–18)
Bilirubin,Total: 0.1 mg/dL — ABNORMAL LOW (ref 0.2–1.0)
Calcium, Total: 8.9 mg/dL (ref 8.5–10.1)
Chloride: 105 mmol/L (ref 98–107)
Co2: 26 mmol/L (ref 21–32)
Creatinine: 1.08 mg/dL (ref 0.60–1.30)
EGFR (African American): >60
EGFR (Non-African Amer.): >60
Glucose: 147 mg/dL — ABNORMAL HIGH (ref 65–99)
Osmolality: 281 (ref 275–301)
Potassium: 3.9 mmol/L (ref 3.5–5.1)
SGOT(AST): 20 U/L (ref 15–37)
SGPT (ALT): 38 U/L (ref 12–78)
Sodium: 140 mmol/L (ref 136–145)
Total Protein: 6.8 g/dL (ref 6.4–8.2)

## 2012-01-30 LAB — CBC CANCER CENTER
Bands: 2 %
HCT: 29.7 % — ABNORMAL LOW (ref 35.0–47.0)
HGB: 9.1 g/dL — ABNORMAL LOW (ref 12.0–16.0)
Lymphocytes: 17 %
MCH: 25.4 pg — ABNORMAL LOW (ref 26.0–34.0)
MCHC: 30.8 g/dL — ABNORMAL LOW (ref 32.0–36.0)
MCV: 82 fL (ref 80–100)
Metamyelocyte: 1 %
Monocytes: 11 %
NRBC/100 WBC: 3 /100
Platelet: 183 x10 3/mm (ref 150–440)
RBC: 3.6 10*6/uL — ABNORMAL LOW (ref 3.80–5.20)
RDW: 22.2 % — ABNORMAL HIGH (ref 11.5–14.5)
Segmented Neutrophils: 69 %
Variant Lymphocyte: 1 %
WBC: 6.1 x10 3/mm (ref 3.6–11.0)

## 2012-01-30 LAB — T4, FREE: Free Thyroxine: 0.77 ng/dL (ref 0.76–1.46)

## 2012-01-30 LAB — FERRITIN: Ferritin (ARMC): 68 ng/mL (ref 8–388)

## 2012-01-30 LAB — LACTATE DEHYDROGENASE: LDH: 398 U/L — ABNORMAL HIGH (ref 81–246)

## 2012-01-30 LAB — IRON AND TIBC
Iron Bind.Cap.(Total): 302 ug/dL (ref 250–450)
Iron Saturation: 15 %
Iron: 45 ug/dL — ABNORMAL LOW (ref 50–170)
Unbound Iron-Bind.Cap.: 257 ug/dL

## 2012-01-30 LAB — TSH: Thyroid Stimulating Horm: 0.463 u[IU]/mL

## 2012-02-20 LAB — CBC CANCER CENTER
Bands: 2 %
HCT: 34.2 % — ABNORMAL LOW (ref 35.0–47.0)
HGB: 10.8 g/dL — ABNORMAL LOW (ref 12.0–16.0)
Lymphocytes: 22 %
MCH: 25.7 pg — ABNORMAL LOW (ref 26.0–34.0)
MCHC: 31.6 g/dL — ABNORMAL LOW (ref 32.0–36.0)
MCV: 81 fL (ref 80–100)
Metamyelocyte: 1 %
Monocytes: 7 %
Platelet: 173 x10 3/mm (ref 150–440)
RBC: 4.2 10*6/uL (ref 3.80–5.20)
RDW: 20.9 % — ABNORMAL HIGH (ref 11.5–14.5)
Segmented Neutrophils: 66 %
Variant Lymphocyte: 2 %
WBC: 7.5 x10 3/mm (ref 3.6–11.0)

## 2012-02-21 ENCOUNTER — Ambulatory Visit: Payer: Self-pay | Admitting: Oncology

## 2012-04-20 ENCOUNTER — Ambulatory Visit: Payer: Self-pay | Admitting: Oncology

## 2012-05-08 DIAGNOSIS — K581 Irritable bowel syndrome with constipation: Secondary | ICD-10-CM | POA: Insufficient documentation

## 2012-05-08 DIAGNOSIS — K5900 Constipation, unspecified: Secondary | ICD-10-CM | POA: Insufficient documentation

## 2012-05-20 LAB — CBC CANCER CENTER
Basophil #: 0.2 x10 3/mm — ABNORMAL HIGH (ref 0.0–0.1)
Basophil %: 1.7 %
Eosinophil #: 0.1 x10 3/mm (ref 0.0–0.7)
Eosinophil %: 1.3 %
HCT: 30.3 % — ABNORMAL LOW (ref 35.0–47.0)
HGB: 9.3 g/dL — ABNORMAL LOW (ref 12.0–16.0)
Lymphocyte #: 0.8 x10 3/mm — ABNORMAL LOW (ref 1.0–3.6)
Lymphocyte %: 8.2 %
MCH: 27.2 pg (ref 26.0–34.0)
MCHC: 30.9 g/dL — ABNORMAL LOW (ref 32.0–36.0)
MCV: 88 fL (ref 80–100)
Monocyte #: 0.5 x10 3/mm (ref 0.2–0.9)
Monocyte %: 5 %
Neutrophil #: 7.7 x10 3/mm — ABNORMAL HIGH (ref 1.4–6.5)
Neutrophil %: 83.8 %
Platelet: 145 x10 3/mm — ABNORMAL LOW (ref 150–440)
RBC: 3.43 10*6/uL — ABNORMAL LOW (ref 3.80–5.20)
RDW: 20.6 % — ABNORMAL HIGH (ref 11.5–14.5)
WBC: 9.2 x10 3/mm (ref 3.6–11.0)

## 2012-05-20 LAB — IRON AND TIBC
Iron Bind.Cap.(Total): 278 ug/dL (ref 250–450)
Iron Saturation: 13 %
Iron: 37 ug/dL — ABNORMAL LOW (ref 50–170)
Unbound Iron-Bind.Cap.: 241 ug/dL

## 2012-05-20 LAB — FERRITIN: Ferritin (ARMC): 196 ng/mL (ref 8–388)

## 2012-05-21 ENCOUNTER — Ambulatory Visit: Payer: Self-pay | Admitting: Oncology

## 2012-06-17 ENCOUNTER — Ambulatory Visit: Payer: Self-pay | Admitting: Physician Assistant

## 2012-06-19 ENCOUNTER — Ambulatory Visit: Payer: Self-pay | Admitting: Internal Medicine

## 2012-07-22 ENCOUNTER — Ambulatory Visit: Payer: Self-pay | Admitting: Oncology

## 2012-07-23 ENCOUNTER — Ambulatory Visit: Payer: Self-pay | Admitting: Oncology

## 2012-07-30 ENCOUNTER — Ambulatory Visit: Payer: Self-pay | Admitting: Internal Medicine

## 2012-07-30 LAB — HCG, QUANTITATIVE, PREGNANCY: Beta Hcg, Quant.: <1 m[IU]/mL — ABNORMAL LOW

## 2012-08-01 ENCOUNTER — Ambulatory Visit: Payer: Self-pay | Admitting: Obstetrics and Gynecology

## 2012-08-01 LAB — BASIC METABOLIC PANEL
Anion Gap: 7 (ref 7–16)
BUN: 14 mg/dL (ref 7–18)
Calcium, Total: 8.7 mg/dL (ref 8.5–10.1)
Chloride: 108 mmol/L — ABNORMAL HIGH (ref 98–107)
Co2: 22 mmol/L (ref 21–32)
Creatinine: 1.25 mg/dL (ref 0.60–1.30)
EGFR (African American): >60
EGFR (Non-African Amer.): 52 — ABNORMAL LOW
Glucose: 107 mg/dL — ABNORMAL HIGH (ref 65–99)
Osmolality: 275 (ref 275–301)
Potassium: 4.8 mmol/L (ref 3.5–5.1)
Sodium: 137 mmol/L (ref 136–145)

## 2012-08-01 LAB — CBC
HCT: 29.4 % — ABNORMAL LOW (ref 35.0–47.0)
HGB: 8.8 g/dL — ABNORMAL LOW (ref 12.0–16.0)
MCH: 25 pg — ABNORMAL LOW (ref 26.0–34.0)
MCHC: 29.9 g/dL — ABNORMAL LOW (ref 32.0–36.0)
MCV: 84 fL (ref 80–100)
Platelet: 168 10*3/uL (ref 150–440)
RBC: 3.52 10*6/uL — ABNORMAL LOW (ref 3.80–5.20)
RDW: 21.5 % — ABNORMAL HIGH (ref 11.5–14.5)
WBC: 11.2 10*3/uL — ABNORMAL HIGH (ref 3.6–11.0)

## 2012-08-01 LAB — BRONCHIAL WASH CULTURE

## 2012-08-06 ENCOUNTER — Ambulatory Visit: Payer: Self-pay | Admitting: Obstetrics and Gynecology

## 2012-08-09 LAB — PATHOLOGY REPORT

## 2012-08-12 LAB — CBC CANCER CENTER
Bands: 2 %
Comment - H1-Com3: NORMAL
HCT: 27.1 % — ABNORMAL LOW (ref 35.0–47.0)
HGB: 8.3 g/dL — ABNORMAL LOW (ref 12.0–16.0)
Lymphocytes: 10 %
MCH: 25.6 pg — ABNORMAL LOW (ref 26.0–34.0)
MCHC: 30.8 g/dL — ABNORMAL LOW (ref 32.0–36.0)
MCV: 83 fL (ref 80–100)
Metamyelocyte: 1 %
Monocytes: 3 %
Platelet: 166 x10 3/mm (ref 150–440)
RBC: 3.25 10*6/uL — ABNORMAL LOW (ref 3.80–5.20)
RDW: 21.8 % — ABNORMAL HIGH (ref 11.5–14.5)
Segmented Neutrophils: 84 %
WBC: 10.8 x10 3/mm (ref 3.6–11.0)

## 2012-08-20 ENCOUNTER — Ambulatory Visit: Payer: Self-pay | Admitting: Oncology

## 2012-08-20 LAB — CULTURE, FUNGUS WITHOUT SMEAR

## 2012-09-05 ENCOUNTER — Inpatient Hospital Stay: Payer: Self-pay

## 2012-09-05 LAB — HEPATIC FUNCTION PANEL A (ARMC)
Albumin: 3 g/dL — ABNORMAL LOW (ref 3.4–5.0)
Alkaline Phosphatase: 341 U/L — ABNORMAL HIGH (ref 50–136)
Bilirubin, Direct: <0.1 mg/dL (ref 0.00–0.20)
Bilirubin,Total: 0.2 mg/dL (ref 0.2–1.0)
SGOT(AST): 81 U/L — ABNORMAL HIGH (ref 15–37)
SGPT (ALT): 124 U/L — ABNORMAL HIGH (ref 12–78)
Total Protein: 7.8 g/dL (ref 6.4–8.2)

## 2012-09-05 LAB — LIPASE, BLOOD: Lipase: 57 U/L — ABNORMAL LOW (ref 73–393)

## 2012-09-05 LAB — CK TOTAL AND CKMB (NOT AT ARMC)
CK, Total: 54 U/L (ref 21–215)
CK-MB: 3.5 ng/mL (ref 0.5–3.6)

## 2012-09-05 LAB — BASIC METABOLIC PANEL
Anion Gap: 13 (ref 7–16)
BUN: 17 mg/dL (ref 7–18)
Calcium, Total: 9.5 mg/dL (ref 8.5–10.1)
Chloride: 108 mmol/L — ABNORMAL HIGH (ref 98–107)
Co2: 19 mmol/L — ABNORMAL LOW (ref 21–32)
Creatinine: 1.58 mg/dL — ABNORMAL HIGH (ref 0.60–1.30)
EGFR (African American): 46 — ABNORMAL LOW
EGFR (Non-African Amer.): 39 — ABNORMAL LOW
Glucose: 134 mg/dL — ABNORMAL HIGH (ref 65–99)
Osmolality: 283 (ref 275–301)
Potassium: 4.4 mmol/L (ref 3.5–5.1)
Sodium: 140 mmol/L (ref 136–145)

## 2012-09-05 LAB — CBC
HCT: 30.4 % — ABNORMAL LOW (ref 35.0–47.0)
HGB: 8.9 g/dL — ABNORMAL LOW (ref 12.0–16.0)
MCH: 23.2 pg — ABNORMAL LOW (ref 26.0–34.0)
MCHC: 29.4 g/dL — ABNORMAL LOW (ref 32.0–36.0)
MCV: 79 fL — ABNORMAL LOW (ref 80–100)
Platelet: 227 10*3/uL (ref 150–440)
RBC: 3.84 10*6/uL (ref 3.80–5.20)
RDW: 22.1 % — ABNORMAL HIGH (ref 11.5–14.5)
WBC: 15.5 10*3/uL — ABNORMAL HIGH (ref 3.6–11.0)

## 2012-09-05 LAB — TROPONIN I: Troponin-I: <0.02 ng/mL

## 2012-09-05 LAB — APTT: Activated PTT: 28.7 secs (ref 23.6–35.9)

## 2012-09-05 LAB — LACTATE DEHYDROGENASE: LDH: 437 U/L — ABNORMAL HIGH (ref 81–246)

## 2012-09-05 LAB — TSH: Thyroid Stimulating Horm: 1.89 u[IU]/mL

## 2012-09-06 LAB — BASIC METABOLIC PANEL
Anion Gap: 6 — ABNORMAL LOW (ref 7–16)
BUN: 15 mg/dL (ref 7–18)
Calcium, Total: 8.9 mg/dL (ref 8.5–10.1)
Chloride: 108 mmol/L — ABNORMAL HIGH (ref 98–107)
Co2: 24 mmol/L (ref 21–32)
Creatinine: 1.38 mg/dL — ABNORMAL HIGH (ref 0.60–1.30)
EGFR (African American): 54 — ABNORMAL LOW
EGFR (Non-African Amer.): 46 — ABNORMAL LOW
Glucose: 124 mg/dL — ABNORMAL HIGH (ref 65–99)
Osmolality: 278 (ref 275–301)
Potassium: 4.2 mmol/L (ref 3.5–5.1)
Sodium: 138 mmol/L (ref 136–145)

## 2012-09-06 LAB — PREGNANCY, URINE: Pregnancy Test, Urine: NEGATIVE m[IU]/mL

## 2012-09-06 LAB — CBC WITH DIFFERENTIAL/PLATELET
HCT: 28.5 % — ABNORMAL LOW (ref 35.0–47.0)
HGB: 8.6 g/dL — ABNORMAL LOW (ref 12.0–16.0)
Lymphocytes: 41 %
MCH: 23.8 pg — ABNORMAL LOW (ref 26.0–34.0)
MCHC: 30 g/dL — ABNORMAL LOW (ref 32.0–36.0)
MCV: 79 fL — ABNORMAL LOW (ref 80–100)
Monocytes: 15 %
Platelet: 221 10*3/uL (ref 150–440)
RBC: 3.6 10*6/uL — ABNORMAL LOW (ref 3.80–5.20)
RDW: 21.9 % — ABNORMAL HIGH (ref 11.5–14.5)
Segmented Neutrophils: 44 %
WBC: 13.9 10*3/uL — ABNORMAL HIGH (ref 3.6–11.0)

## 2012-09-07 LAB — COMPREHENSIVE METABOLIC PANEL
Albumin: 2.2 g/dL — ABNORMAL LOW (ref 3.4–5.0)
Alkaline Phosphatase: 220 U/L — ABNORMAL HIGH (ref 50–136)
Anion Gap: 6 — ABNORMAL LOW (ref 7–16)
BUN: 12 mg/dL (ref 7–18)
Bilirubin,Total: 0.3 mg/dL (ref 0.2–1.0)
Calcium, Total: 8.4 mg/dL — ABNORMAL LOW (ref 8.5–10.1)
Chloride: 111 mmol/L — ABNORMAL HIGH (ref 98–107)
Co2: 23 mmol/L (ref 21–32)
Creatinine: 1.2 mg/dL (ref 0.60–1.30)
EGFR (African American): >60
EGFR (Non-African Amer.): 55 — ABNORMAL LOW
Glucose: 155 mg/dL — ABNORMAL HIGH (ref 65–99)
Osmolality: 282 (ref 275–301)
Potassium: 4.7 mmol/L (ref 3.5–5.1)
SGOT(AST): 40 U/L — ABNORMAL HIGH (ref 15–37)
SGPT (ALT): 63 U/L (ref 12–78)
Sodium: 140 mmol/L (ref 136–145)
Total Protein: 6.3 g/dL — ABNORMAL LOW (ref 6.4–8.2)

## 2012-09-07 LAB — CBC WITH DIFFERENTIAL/PLATELET
Bands: 7 %
HCT: 26.4 % — ABNORMAL LOW (ref 35.0–47.0)
HGB: 7.2 g/dL — ABNORMAL LOW (ref 12.0–16.0)
Lymphocytes: 4 %
MCH: 21.5 pg — ABNORMAL LOW (ref 26.0–34.0)
MCHC: 27.3 g/dL — ABNORMAL LOW (ref 32.0–36.0)
MCV: 79 fL — ABNORMAL LOW (ref 80–100)
Metamyelocyte: 1 %
Monocytes: 5 %
NRBC/100 WBC: 1 /
Platelet: 189 10*3/uL (ref 150–440)
RBC: 3.35 10*6/uL — ABNORMAL LOW (ref 3.80–5.20)
RDW: 22.3 % — ABNORMAL HIGH (ref 11.5–14.5)
Segmented Neutrophils: 83 %
WBC: 14.2 10*3/uL — ABNORMAL HIGH (ref 3.6–11.0)

## 2012-09-10 LAB — CBC WITH DIFFERENTIAL/PLATELET
Bands: 2 %
HCT: 22.2 % — ABNORMAL LOW (ref 35.0–47.0)
HGB: 6.5 g/dL — ABNORMAL LOW (ref 12.0–16.0)
Lymphocytes: 9 %
MCH: 23 pg — ABNORMAL LOW (ref 26.0–34.0)
MCHC: 29.3 g/dL — ABNORMAL LOW (ref 32.0–36.0)
MCV: 79 fL — ABNORMAL LOW (ref 80–100)
Metamyelocyte: 4 %
Monocytes: 6 %
Myelocyte: 1 %
NRBC/100 WBC: 2 /
Platelet: 164 10*3/uL (ref 150–440)
RBC: 2.82 10*6/uL — ABNORMAL LOW (ref 3.80–5.20)
RDW: 22.1 % — ABNORMAL HIGH (ref 11.5–14.5)
Segmented Neutrophils: 78 %
WBC: 9.2 10*3/uL (ref 3.6–11.0)

## 2012-09-10 LAB — WOUND CULTURE

## 2012-09-10 LAB — PATHOLOGY REPORT

## 2012-09-11 LAB — HEMOGLOBIN: HGB: 8.4 g/dL — ABNORMAL LOW (ref 12.0–16.0)

## 2012-09-20 ENCOUNTER — Ambulatory Visit: Payer: Self-pay | Admitting: Oncology

## 2012-09-25 LAB — CULTURE, FUNGUS WITHOUT SMEAR

## 2012-09-26 LAB — CULTURE, FUNGUS WITHOUT SMEAR

## 2012-10-05 ENCOUNTER — Inpatient Hospital Stay: Payer: Self-pay | Admitting: Internal Medicine

## 2012-10-05 LAB — COMPREHENSIVE METABOLIC PANEL
Albumin: 2.3 g/dL — ABNORMAL LOW (ref 3.4–5.0)
Alkaline Phosphatase: 248 U/L — ABNORMAL HIGH (ref 50–136)
Anion Gap: 8 (ref 7–16)
BUN: 12 mg/dL (ref 7–18)
Bilirubin,Total: 0.5 mg/dL (ref 0.2–1.0)
Calcium, Total: 8.9 mg/dL (ref 8.5–10.1)
Chloride: 100 mmol/L (ref 98–107)
Co2: 30 mmol/L (ref 21–32)
Creatinine: 1.06 mg/dL (ref 0.60–1.30)
EGFR (African American): >60
EGFR (Non-African Amer.): >60
Glucose: 138 mg/dL — ABNORMAL HIGH (ref 65–99)
Osmolality: 278 (ref 275–301)
Potassium: 3.7 mmol/L (ref 3.5–5.1)
SGOT(AST): 65 U/L — ABNORMAL HIGH (ref 15–37)
SGPT (ALT): 83 U/L — ABNORMAL HIGH (ref 12–78)
Sodium: 138 mmol/L (ref 136–145)
Total Protein: 7.1 g/dL (ref 6.4–8.2)

## 2012-10-05 LAB — CBC
HCT: 28.8 % — ABNORMAL LOW (ref 35.0–47.0)
HGB: 8.9 g/dL — ABNORMAL LOW (ref 12.0–16.0)
MCH: 23.6 pg — ABNORMAL LOW (ref 26.0–34.0)
MCHC: 30.9 g/dL — ABNORMAL LOW (ref 32.0–36.0)
MCV: 76 fL — ABNORMAL LOW (ref 80–100)
Platelet: 143 10*3/uL — ABNORMAL LOW (ref 150–440)
RBC: 3.78 10*6/uL — ABNORMAL LOW (ref 3.80–5.20)
RDW: 22.2 % — ABNORMAL HIGH (ref 11.5–14.5)
WBC: 11 10*3/uL (ref 3.6–11.0)

## 2012-10-05 LAB — CK TOTAL AND CKMB (NOT AT ARMC)
CK, Total: 114 U/L (ref 21–215)
CK-MB: 2.3 ng/mL (ref 0.5–3.6)

## 2012-10-05 LAB — PROTIME-INR
INR: 1
Prothrombin Time: 13.1 secs (ref 11.5–14.7)

## 2012-10-05 LAB — HEMATOCRIT: HCT: 28.9 % — ABNORMAL LOW (ref 35.0–47.0)

## 2012-10-05 LAB — HEMOGLOBIN: HGB: 9.1 g/dL — ABNORMAL LOW (ref 12.0–16.0)

## 2012-10-05 LAB — TROPONIN I: Troponin-I: <0.02 ng/mL

## 2012-10-05 LAB — PREGNANCY, URINE: Pregnancy Test, Urine: NEGATIVE m[IU]/mL

## 2012-10-06 LAB — BASIC METABOLIC PANEL
Anion Gap: 8 (ref 7–16)
BUN: 9 mg/dL (ref 7–18)
Calcium, Total: 9.1 mg/dL (ref 8.5–10.1)
Chloride: 99 mmol/L (ref 98–107)
Co2: 31 mmol/L (ref 21–32)
Creatinine: 0.91 mg/dL (ref 0.60–1.30)
EGFR (African American): >60
EGFR (Non-African Amer.): >60
Glucose: 82 mg/dL (ref 65–99)
Osmolality: 273 (ref 275–301)
Potassium: 2.4 mmol/L — CL (ref 3.5–5.1)
Sodium: 138 mmol/L (ref 136–145)

## 2012-10-06 LAB — CBC WITH DIFFERENTIAL/PLATELET
Basophil #: 0.1 10*3/uL (ref 0.0–0.1)
Basophil %: 0.9 %
Eosinophil #: 0.1 10*3/uL (ref 0.0–0.7)
Eosinophil %: 0.5 %
HCT: 26.4 % — ABNORMAL LOW (ref 35.0–47.0)
HGB: 8.3 g/dL — ABNORMAL LOW (ref 12.0–16.0)
Lymphocyte #: 2.7 10*3/uL (ref 1.0–3.6)
Lymphocyte %: 26.8 %
MCH: 23.9 pg — ABNORMAL LOW (ref 26.0–34.0)
MCHC: 31.5 g/dL — ABNORMAL LOW (ref 32.0–36.0)
MCV: 76 fL — ABNORMAL LOW (ref 80–100)
Monocyte #: 1 x10 3/mm — ABNORMAL HIGH (ref 0.2–0.9)
Monocyte %: 10 %
Neutrophil #: 6.3 10*3/uL (ref 1.4–6.5)
Neutrophil %: 61.8 %
Platelet: 132 10*3/uL — ABNORMAL LOW (ref 150–440)
RBC: 3.47 10*6/uL — ABNORMAL LOW (ref 3.80–5.20)
RDW: 22.7 % — ABNORMAL HIGH (ref 11.5–14.5)
WBC: 10.1 10*3/uL (ref 3.6–11.0)

## 2012-10-06 LAB — POTASSIUM: Potassium: 3.4 mmol/L — ABNORMAL LOW (ref 3.5–5.1)

## 2012-10-06 LAB — SEDIMENTATION RATE: Erythrocyte Sed Rate: >140 mm/hr — ABNORMAL HIGH (ref 0–20)

## 2012-10-07 LAB — BASIC METABOLIC PANEL
Anion Gap: 6 — ABNORMAL LOW (ref 7–16)
BUN: 8 mg/dL (ref 7–18)
Calcium, Total: 9.1 mg/dL (ref 8.5–10.1)
Chloride: 101 mmol/L (ref 98–107)
Co2: 30 mmol/L (ref 21–32)
Creatinine: 0.88 mg/dL (ref 0.60–1.30)
EGFR (African American): >60
EGFR (Non-African Amer.): >60
Glucose: 88 mg/dL (ref 65–99)
Osmolality: 272 (ref 275–301)
Potassium: 3 mmol/L — ABNORMAL LOW (ref 3.5–5.1)
Sodium: 137 mmol/L (ref 136–145)

## 2012-10-10 LAB — CULTURE, BLOOD (SINGLE)

## 2012-11-02 DIAGNOSIS — R042 Hemoptysis: Secondary | ICD-10-CM | POA: Insufficient documentation

## 2012-11-11 ENCOUNTER — Ambulatory Visit: Payer: Self-pay | Admitting: Oncology

## 2012-11-11 LAB — CBC CANCER CENTER
Basophil #: 0 x10 3/mm (ref 0.0–0.1)
Basophil %: 0.4 %
Eosinophil #: 0 x10 3/mm (ref 0.0–0.7)
Eosinophil %: 0.2 %
HCT: 33.3 % — ABNORMAL LOW (ref 35.0–47.0)
HGB: 10 g/dL — ABNORMAL LOW (ref 12.0–16.0)
Lymphocyte #: 2.2 x10 3/mm (ref 1.0–3.6)
Lymphocyte %: 20.7 %
MCH: 22.5 pg — ABNORMAL LOW (ref 26.0–34.0)
MCHC: 30.1 g/dL — ABNORMAL LOW (ref 32.0–36.0)
MCV: 75 fL — ABNORMAL LOW (ref 80–100)
Monocyte #: 0.4 x10 3/mm (ref 0.2–0.9)
Monocyte %: 3.8 %
Neutrophil #: 8.1 x10 3/mm — ABNORMAL HIGH (ref 1.4–6.5)
Neutrophil %: 74.9 %
Platelet: 156 x10 3/mm (ref 150–440)
RBC: 4.47 10*6/uL (ref 3.80–5.20)
RDW: 24.7 % — ABNORMAL HIGH (ref 11.5–14.5)
WBC: 10.8 x10 3/mm (ref 3.6–11.0)

## 2012-11-11 LAB — COMPREHENSIVE METABOLIC PANEL
Albumin: 2.9 g/dL — ABNORMAL LOW (ref 3.4–5.0)
Alkaline Phosphatase: 317 U/L — ABNORMAL HIGH (ref 50–136)
Anion Gap: 10 (ref 7–16)
BUN: 10 mg/dL (ref 7–18)
Bilirubin,Total: 0.2 mg/dL (ref 0.2–1.0)
Calcium, Total: 9.2 mg/dL (ref 8.5–10.1)
Chloride: 102 mmol/L (ref 98–107)
Co2: 27 mmol/L (ref 21–32)
Creatinine: 1.19 mg/dL (ref 0.60–1.30)
EGFR (African American): >60
EGFR (Non-African Amer.): 55 — ABNORMAL LOW
Glucose: 149 mg/dL — ABNORMAL HIGH (ref 65–99)
Osmolality: 279 (ref 275–301)
Potassium: 4.5 mmol/L (ref 3.5–5.1)
SGOT(AST): 51 U/L — ABNORMAL HIGH (ref 15–37)
SGPT (ALT): 91 U/L — ABNORMAL HIGH (ref 12–78)
Sodium: 139 mmol/L (ref 136–145)
Total Protein: 7.5 g/dL (ref 6.4–8.2)

## 2012-11-11 LAB — IRON AND TIBC
Iron Bind.Cap.(Total): 330 ug/dL (ref 250–450)
Iron Saturation: 15 %
Iron: 50 ug/dL (ref 50–170)
Unbound Iron-Bind.Cap.: 280 ug/dL

## 2012-11-11 LAB — FERRITIN: Ferritin (ARMC): 132 ng/mL (ref 8–388)

## 2012-11-20 ENCOUNTER — Ambulatory Visit: Payer: Self-pay | Admitting: Oncology

## 2013-02-05 ENCOUNTER — Ambulatory Visit: Payer: Self-pay | Admitting: Internal Medicine

## 2013-03-03 ENCOUNTER — Ambulatory Visit: Payer: Self-pay | Admitting: Oncology

## 2013-03-03 LAB — CBC CANCER CENTER
Basophil #: 0.1 x10 3/mm (ref 0.0–0.1)
Basophil %: 1 %
Eosinophil #: 0 x10 3/mm (ref 0.0–0.7)
Eosinophil %: 0.1 %
HCT: 35.5 % (ref 35.0–47.0)
HGB: 10.6 g/dL — ABNORMAL LOW (ref 12.0–16.0)
Lymphocyte #: 2.3 x10 3/mm (ref 1.0–3.6)
Lymphocyte %: 22.2 %
MCH: 24.8 pg — ABNORMAL LOW (ref 26.0–34.0)
MCHC: 29.8 g/dL — ABNORMAL LOW (ref 32.0–36.0)
MCV: 83 fL (ref 80–100)
Monocyte #: 0.8 x10 3/mm (ref 0.2–0.9)
Monocyte %: 7.8 %
Neutrophil #: 7 x10 3/mm — ABNORMAL HIGH (ref 1.4–6.5)
Neutrophil %: 68.9 %
Platelet: 136 x10 3/mm — ABNORMAL LOW (ref 150–440)
RBC: 4.26 10*6/uL (ref 3.80–5.20)
RDW: 21.1 % — ABNORMAL HIGH (ref 11.5–14.5)
WBC: 10.2 x10 3/mm (ref 3.6–11.0)

## 2013-03-03 LAB — IRON AND TIBC
Iron Bind.Cap.(Total): 203 ug/dL — ABNORMAL LOW (ref 250–450)
Iron Saturation: 21 %
Iron: 43 ug/dL — ABNORMAL LOW (ref 50–170)
Unbound Iron-Bind.Cap.: 160 ug/dL

## 2013-03-03 LAB — FERRITIN: Ferritin (ARMC): 642 ng/mL — ABNORMAL HIGH (ref 8–388)

## 2013-03-17 ENCOUNTER — Ambulatory Visit: Payer: Self-pay | Admitting: Internal Medicine

## 2013-03-23 ENCOUNTER — Ambulatory Visit: Payer: Self-pay | Admitting: Oncology

## 2013-04-30 DIAGNOSIS — H209 Unspecified iridocyclitis: Secondary | ICD-10-CM | POA: Insufficient documentation

## 2013-04-30 DIAGNOSIS — H40043 Steroid responder, bilateral: Secondary | ICD-10-CM | POA: Insufficient documentation

## 2013-06-27 ENCOUNTER — Ambulatory Visit: Payer: Self-pay | Admitting: Oncology

## 2013-06-27 LAB — CBC CANCER CENTER
Basophil #: 0.2 x10 3/mm — ABNORMAL HIGH (ref 0.0–0.1)
Basophil %: 1.3 %
Eosinophil #: 0.1 x10 3/mm (ref 0.0–0.7)
Eosinophil %: 0.4 %
HCT: 43.8 % (ref 35.0–47.0)
HGB: 13.4 g/dL (ref 12.0–16.0)
Lymphocyte #: 2.2 x10 3/mm (ref 1.0–3.6)
Lymphocyte %: 18.4 %
MCH: 25.2 pg — ABNORMAL LOW (ref 26.0–34.0)
MCHC: 30.6 g/dL — ABNORMAL LOW (ref 32.0–36.0)
MCV: 82 fL (ref 80–100)
Monocyte #: 0.7 x10 3/mm (ref 0.2–0.9)
Monocyte %: 6.1 %
Neutrophil #: 9 x10 3/mm — ABNORMAL HIGH (ref 1.4–6.5)
Neutrophil %: 73.8 %
Platelet: 187 x10 3/mm (ref 150–440)
RBC: 5.32 10*6/uL — ABNORMAL HIGH (ref 3.80–5.20)
RDW: 20 % — ABNORMAL HIGH (ref 11.5–14.5)
WBC: 12.1 x10 3/mm — ABNORMAL HIGH (ref 3.6–11.0)

## 2013-07-21 ENCOUNTER — Ambulatory Visit: Payer: Self-pay | Admitting: Oncology

## 2013-10-02 ENCOUNTER — Ambulatory Visit: Payer: Self-pay | Admitting: Oncology

## 2013-10-02 LAB — CBC CANCER CENTER
Basophil #: 0.1 x10 3/mm (ref 0.0–0.1)
Basophil %: 1 %
Eosinophil #: 0.2 x10 3/mm (ref 0.0–0.7)
Eosinophil %: 1.5 %
HCT: 42.5 % (ref 35.0–47.0)
HGB: 13.1 g/dL (ref 12.0–16.0)
Lymphocyte #: 2.5 x10 3/mm (ref 1.0–3.6)
Lymphocyte %: 20.5 %
MCH: 25.8 pg — ABNORMAL LOW (ref 26.0–34.0)
MCHC: 30.8 g/dL — ABNORMAL LOW (ref 32.0–36.0)
MCV: 84 fL (ref 80–100)
Monocyte #: 1.6 x10 3/mm — ABNORMAL HIGH (ref 0.2–0.9)
Monocyte %: 12.8 %
Neutrophil #: 8 x10 3/mm — ABNORMAL HIGH (ref 1.4–6.5)
Neutrophil %: 64.2 %
Platelet: 227 x10 3/mm (ref 150–440)
RBC: 5.07 10*6/uL (ref 3.80–5.20)
RDW: 21.4 % — ABNORMAL HIGH (ref 11.5–14.5)
WBC: 12.4 x10 3/mm — ABNORMAL HIGH (ref 3.6–11.0)

## 2013-10-06 ENCOUNTER — Emergency Department: Payer: Self-pay | Admitting: Student

## 2013-10-06 LAB — URINALYSIS, COMPLETE
Bacteria: NONE SEEN
Bilirubin,UR: NEGATIVE
Blood: NEGATIVE
Glucose,UR: NEGATIVE mg/dL (ref 0–75)
Ketone: NEGATIVE
Leukocyte Esterase: NEGATIVE
Nitrite: NEGATIVE
Ph: 7 (ref 4.5–8.0)
Protein: NEGATIVE
RBC,UR: <1 /HPF (ref 0–5)
Specific Gravity: 1.013 (ref 1.003–1.030)
Squamous Epithelial: 6
WBC UR: 1 /HPF (ref 0–5)

## 2013-10-06 LAB — COMPREHENSIVE METABOLIC PANEL
Albumin: 3.4 g/dL (ref 3.4–5.0)
Alkaline Phosphatase: 292 U/L — ABNORMAL HIGH
Anion Gap: 10 (ref 7–16)
BUN: 6 mg/dL — ABNORMAL LOW (ref 7–18)
Bilirubin,Total: 0.4 mg/dL (ref 0.2–1.0)
Calcium, Total: 9.5 mg/dL (ref 8.5–10.1)
Chloride: 97 mmol/L — ABNORMAL LOW (ref 98–107)
Co2: 30 mmol/L (ref 21–32)
Creatinine: 0.93 mg/dL (ref 0.60–1.30)
EGFR (African American): >60
EGFR (Non-African Amer.): >60
Glucose: 119 mg/dL — ABNORMAL HIGH (ref 65–99)
Osmolality: 273 (ref 275–301)
Potassium: 2.9 mmol/L — ABNORMAL LOW (ref 3.5–5.1)
SGOT(AST): 47 U/L — ABNORMAL HIGH (ref 15–37)
SGPT (ALT): 75 U/L — ABNORMAL HIGH
Sodium: 137 mmol/L (ref 136–145)
Total Protein: 7.5 g/dL (ref 6.4–8.2)

## 2013-10-06 LAB — PROTIME-INR
INR: 1
Prothrombin Time: 12.9 secs (ref 11.5–14.7)

## 2013-10-06 LAB — CBC
HCT: 44.9 % (ref 35.0–47.0)
HGB: 13.7 g/dL (ref 12.0–16.0)
MCH: 25.9 pg — ABNORMAL LOW (ref 26.0–34.0)
MCHC: 30.6 g/dL — ABNORMAL LOW (ref 32.0–36.0)
MCV: 85 fL (ref 80–100)
Platelet: 233 10*3/uL (ref 150–440)
RBC: 5.3 10*6/uL — ABNORMAL HIGH (ref 3.80–5.20)
RDW: 21.4 % — ABNORMAL HIGH (ref 11.5–14.5)
WBC: 11.9 10*3/uL — ABNORMAL HIGH (ref 3.6–11.0)

## 2013-10-06 LAB — APTT: Activated PTT: 33.2 secs (ref 23.6–35.9)

## 2013-10-06 LAB — TROPONIN I
Troponin-I: <0.02 ng/mL
Troponin-I: <0.02 ng/mL

## 2013-10-06 LAB — PREGNANCY, URINE: Pregnancy Test, Urine: NEGATIVE m[IU]/mL

## 2013-10-06 LAB — LIPASE, BLOOD: Lipase: 42 U/L — ABNORMAL LOW (ref 73–393)

## 2013-10-21 ENCOUNTER — Ambulatory Visit: Payer: Self-pay | Admitting: Oncology

## 2014-01-02 ENCOUNTER — Ambulatory Visit: Payer: Self-pay | Admitting: Oncology

## 2014-01-02 LAB — COMPREHENSIVE METABOLIC PANEL
Albumin: 3.2 g/dL — ABNORMAL LOW (ref 3.4–5.0)
Alkaline Phosphatase: 183 U/L — ABNORMAL HIGH
Anion Gap: 11 (ref 7–16)
BUN: 12 mg/dL (ref 7–18)
Bilirubin,Total: 0.2 mg/dL (ref 0.2–1.0)
Calcium, Total: 8.9 mg/dL (ref 8.5–10.1)
Chloride: 99 mmol/L (ref 98–107)
Co2: 30 mmol/L (ref 21–32)
Creatinine: 1.22 mg/dL (ref 0.60–1.30)
EGFR (African American): >60
EGFR (Non-African Amer.): 50 — ABNORMAL LOW
Glucose: 105 mg/dL — ABNORMAL HIGH (ref 65–99)
Osmolality: 280 (ref 275–301)
Potassium: 2.5 mmol/L — CL (ref 3.5–5.1)
SGOT(AST): 34 U/L (ref 15–37)
SGPT (ALT): 83 U/L — ABNORMAL HIGH
Sodium: 140 mmol/L (ref 136–145)
Total Protein: 7.1 g/dL (ref 6.4–8.2)

## 2014-01-02 LAB — CBC CANCER CENTER
Basophil #: 0.1 x10 3/mm (ref 0.0–0.1)
Basophil %: 1 %
Eosinophil #: 0.2 x10 3/mm (ref 0.0–0.7)
Eosinophil %: 1.7 %
HCT: 42 % (ref 35.0–47.0)
HGB: 13.2 g/dL (ref 12.0–16.0)
Lymphocyte #: 1.7 x10 3/mm (ref 1.0–3.6)
Lymphocyte %: 16.9 %
MCH: 25.5 pg — ABNORMAL LOW (ref 26.0–34.0)
MCHC: 31.4 g/dL — ABNORMAL LOW (ref 32.0–36.0)
MCV: 81 fL (ref 80–100)
Monocyte #: 0.8 x10 3/mm (ref 0.2–0.9)
Monocyte %: 8.3 %
Neutrophil #: 7.1 x10 3/mm — ABNORMAL HIGH (ref 1.4–6.5)
Neutrophil %: 72.1 %
Platelet: 257 x10 3/mm (ref 150–440)
RBC: 5.18 10*6/uL (ref 3.80–5.20)
RDW: 19 % — ABNORMAL HIGH (ref 11.5–14.5)
WBC: 9.8 x10 3/mm (ref 3.6–11.0)

## 2014-01-02 LAB — PHOSPHORUS: Phosphorus: 3 mg/dL (ref 2.5–4.9)

## 2014-01-02 LAB — MAGNESIUM: Magnesium: 2.1 mg/dL

## 2014-01-20 ENCOUNTER — Ambulatory Visit: Payer: Self-pay | Admitting: Oncology

## 2014-03-27 DIAGNOSIS — R918 Other nonspecific abnormal finding of lung field: Secondary | ICD-10-CM | POA: Diagnosis not present

## 2014-03-27 DIAGNOSIS — M05742 Rheumatoid arthritis with rheumatoid factor of left hand without organ or systems involvement: Secondary | ICD-10-CM | POA: Diagnosis not present

## 2014-03-27 DIAGNOSIS — Z7952 Long term (current) use of systemic steroids: Secondary | ICD-10-CM | POA: Diagnosis not present

## 2014-03-27 DIAGNOSIS — I5030 Unspecified diastolic (congestive) heart failure: Secondary | ICD-10-CM | POA: Diagnosis not present

## 2014-03-27 DIAGNOSIS — M329 Systemic lupus erythematosus, unspecified: Secondary | ICD-10-CM | POA: Diagnosis not present

## 2014-03-27 DIAGNOSIS — M0510 Rheumatoid lung disease with rheumatoid arthritis of unspecified site: Secondary | ICD-10-CM | POA: Diagnosis not present

## 2014-03-27 DIAGNOSIS — E785 Hyperlipidemia, unspecified: Secondary | ICD-10-CM | POA: Diagnosis not present

## 2014-03-27 DIAGNOSIS — M810 Age-related osteoporosis without current pathological fracture: Secondary | ICD-10-CM | POA: Diagnosis not present

## 2014-03-27 DIAGNOSIS — I10 Essential (primary) hypertension: Secondary | ICD-10-CM | POA: Diagnosis not present

## 2014-03-27 DIAGNOSIS — M05741 Rheumatoid arthritis with rheumatoid factor of right hand without organ or systems involvement: Secondary | ICD-10-CM | POA: Diagnosis not present

## 2014-03-27 DIAGNOSIS — M179 Osteoarthritis of knee, unspecified: Secondary | ICD-10-CM | POA: Diagnosis not present

## 2014-04-03 DIAGNOSIS — M329 Systemic lupus erythematosus, unspecified: Secondary | ICD-10-CM | POA: Diagnosis not present

## 2014-04-03 DIAGNOSIS — Z79899 Other long term (current) drug therapy: Secondary | ICD-10-CM | POA: Diagnosis not present

## 2014-04-03 DIAGNOSIS — C9290 Myeloid leukemia, unspecified, not having achieved remission: Secondary | ICD-10-CM | POA: Diagnosis not present

## 2014-04-03 DIAGNOSIS — C9210 Chronic myeloid leukemia, BCR/ABL-positive, not having achieved remission: Secondary | ICD-10-CM | POA: Diagnosis not present

## 2014-04-03 DIAGNOSIS — M351 Other overlap syndromes: Secondary | ICD-10-CM | POA: Diagnosis not present

## 2014-04-03 DIAGNOSIS — M069 Rheumatoid arthritis, unspecified: Secondary | ICD-10-CM | POA: Diagnosis not present

## 2014-04-09 DIAGNOSIS — M0579 Rheumatoid arthritis with rheumatoid factor of multiple sites without organ or systems involvement: Secondary | ICD-10-CM | POA: Diagnosis not present

## 2014-04-16 DIAGNOSIS — K5900 Constipation, unspecified: Secondary | ICD-10-CM | POA: Diagnosis not present

## 2014-04-16 DIAGNOSIS — I10 Essential (primary) hypertension: Secondary | ICD-10-CM | POA: Diagnosis not present

## 2014-04-16 DIAGNOSIS — F321 Major depressive disorder, single episode, moderate: Secondary | ICD-10-CM | POA: Diagnosis not present

## 2014-04-16 DIAGNOSIS — C9210 Chronic myeloid leukemia, BCR/ABL-positive, not having achieved remission: Secondary | ICD-10-CM | POA: Diagnosis not present

## 2014-04-16 DIAGNOSIS — M329 Systemic lupus erythematosus, unspecified: Secondary | ICD-10-CM | POA: Diagnosis not present

## 2014-04-23 DIAGNOSIS — Z79899 Other long term (current) drug therapy: Secondary | ICD-10-CM | POA: Diagnosis not present

## 2014-04-23 DIAGNOSIS — M0579 Rheumatoid arthritis with rheumatoid factor of multiple sites without organ or systems involvement: Secondary | ICD-10-CM | POA: Diagnosis not present

## 2014-06-01 DIAGNOSIS — I10 Essential (primary) hypertension: Secondary | ICD-10-CM | POA: Diagnosis not present

## 2014-06-01 DIAGNOSIS — M329 Systemic lupus erythematosus, unspecified: Secondary | ICD-10-CM | POA: Diagnosis not present

## 2014-06-01 DIAGNOSIS — Z0001 Encounter for general adult medical examination with abnormal findings: Secondary | ICD-10-CM | POA: Diagnosis not present

## 2014-06-01 DIAGNOSIS — Z124 Encounter for screening for malignant neoplasm of cervix: Secondary | ICD-10-CM | POA: Diagnosis not present

## 2014-06-01 DIAGNOSIS — R300 Dysuria: Secondary | ICD-10-CM | POA: Diagnosis not present

## 2014-06-01 DIAGNOSIS — C9210 Chronic myeloid leukemia, BCR/ABL-positive, not having achieved remission: Secondary | ICD-10-CM | POA: Diagnosis not present

## 2014-06-01 DIAGNOSIS — M150 Primary generalized (osteo)arthritis: Secondary | ICD-10-CM | POA: Diagnosis not present

## 2014-06-01 DIAGNOSIS — F321 Major depressive disorder, single episode, moderate: Secondary | ICD-10-CM | POA: Diagnosis not present

## 2014-06-08 ENCOUNTER — Ambulatory Visit
Admit: 2014-06-08 | Disposition: A | Discharge status: Home / Self Care | Payer: Self-pay | Attending: Oncology | Admitting: Oncology

## 2014-06-08 DIAGNOSIS — E042 Nontoxic multinodular goiter: Secondary | ICD-10-CM | POA: Diagnosis not present

## 2014-06-08 DIAGNOSIS — I10 Essential (primary) hypertension: Secondary | ICD-10-CM | POA: Diagnosis not present

## 2014-06-08 DIAGNOSIS — M069 Rheumatoid arthritis, unspecified: Secondary | ICD-10-CM | POA: Diagnosis not present

## 2014-06-08 DIAGNOSIS — Z8711 Personal history of peptic ulcer disease: Secondary | ICD-10-CM | POA: Diagnosis not present

## 2014-06-08 DIAGNOSIS — C9210 Chronic myeloid leukemia, BCR/ABL-positive, not having achieved remission: Secondary | ICD-10-CM | POA: Diagnosis not present

## 2014-06-08 DIAGNOSIS — M329 Systemic lupus erythematosus, unspecified: Secondary | ICD-10-CM | POA: Diagnosis not present

## 2014-06-08 DIAGNOSIS — Z7952 Long term (current) use of systemic steroids: Secondary | ICD-10-CM | POA: Diagnosis not present

## 2014-06-08 DIAGNOSIS — Z8541 Personal history of malignant neoplasm of cervix uteri: Secondary | ICD-10-CM | POA: Diagnosis not present

## 2014-06-08 DIAGNOSIS — Z79899 Other long term (current) drug therapy: Secondary | ICD-10-CM | POA: Diagnosis not present

## 2014-06-08 DIAGNOSIS — R918 Other nonspecific abnormal finding of lung field: Secondary | ICD-10-CM | POA: Diagnosis not present

## 2014-06-08 DIAGNOSIS — E876 Hypokalemia: Secondary | ICD-10-CM | POA: Diagnosis not present

## 2014-06-08 DIAGNOSIS — Z1231 Encounter for screening mammogram for malignant neoplasm of breast: Secondary | ICD-10-CM | POA: Diagnosis not present

## 2014-06-08 LAB — LIPID PANEL
Cholesterol: 253 mg/dL — ABNORMAL HIGH
HDL Cholesterol: 71 mg/dL
Ldl Cholesterol, Calc: 155 mg/dL — ABNORMAL HIGH
Triglycerides: 134 mg/dL
VLDL Cholesterol, Calc: 27 mg/dL

## 2014-06-08 LAB — COMPREHENSIVE METABOLIC PANEL
Albumin: 3.7 g/dL
Alkaline Phosphatase: 118 U/L
Anion Gap: 8 (ref 7–16)
BUN: 13 mg/dL
Bilirubin,Total: 0.5 mg/dL
Calcium, Total: 8.8 mg/dL — ABNORMAL LOW
Chloride: 97 mmol/L — ABNORMAL LOW
Co2: 30 mmol/L
Creatinine: 1.12 mg/dL — ABNORMAL HIGH
EGFR (African American): >60
EGFR (Non-African Amer.): 59 — ABNORMAL LOW
Glucose: 120 mg/dL — ABNORMAL HIGH
Potassium: 2.9 mmol/L — ABNORMAL LOW
SGOT(AST): 27 U/L
SGPT (ALT): 39 U/L
Sodium: 135 mmol/L
Total Protein: 6.9 g/dL

## 2014-06-08 LAB — CBC CANCER CENTER
Basophil #: 0.1 x10 3/mm (ref 0.0–0.1)
Basophil %: 1.4 %
Eosinophil #: 0.1 x10 3/mm (ref 0.0–0.7)
Eosinophil %: 1 %
HCT: 43 % (ref 35.0–47.0)
HGB: 13.7 g/dL (ref 12.0–16.0)
Lymphocyte #: 1.5 x10 3/mm (ref 1.0–3.6)
Lymphocyte %: 16.2 %
MCH: 25.6 pg — ABNORMAL LOW (ref 26.0–34.0)
MCHC: 31.9 g/dL — ABNORMAL LOW (ref 32.0–36.0)
MCV: 80 fL (ref 80–100)
Monocyte #: 0.9 x10 3/mm (ref 0.2–0.9)
Monocyte %: 9.3 %
Neutrophil #: 6.7 x10 3/mm — ABNORMAL HIGH (ref 1.4–6.5)
Neutrophil %: 72.1 %
Platelet: 242 x10 3/mm (ref 150–440)
RBC: 5.37 10*6/uL — ABNORMAL HIGH (ref 3.80–5.20)
RDW: 19.9 % — ABNORMAL HIGH (ref 11.5–14.5)
WBC: 9.3 x10 3/mm (ref 3.6–11.0)

## 2014-06-08 LAB — MAGNESIUM: Magnesium: 2.1 mg/dL

## 2014-06-08 LAB — TSH: Thyroid Stimulating Horm: 1.196 u[IU]/mL

## 2014-06-08 LAB — PHOSPHORUS: Phosphorus: 2.7 mg/dL

## 2014-06-12 NOTE — Op Note (Signed)
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  PROCEDURE:  1.  Wide local excision of left vulva and mass and laser ablation of clitoral hood, HPV.  2.  Colposcopic evaluation of the vulvar and outer vaginal area.    ESTIMATED BLOOD LOSS: Approximately 100 mL.   SURGEON: Delsa Sale, M.D.   ASSISTANT: Jacquelyne Balint, MD   FINDINGS: On colposcopy,  some acetowhite areas on the vulva of minor infection with a large, approximately 3 cm mass on the left labia and then the clitoral hood, with wide excision of approximately 12 cm x 4 cm cut, removed and approximated.   DESCRIPTION OF PROCEDURE: The patient was taken to the operating room and placed in supine position. After adequate general endotracheal anesthesia was instilled, the patient was prepped and draped in the usual sterile fashion. The patient's vagina was prepped and the perineum was prepped and acetic acid was placed on the perineum and on soaked 4 x 4's, which were allowed to sit in the vagina and on the skin. After these soaked pads had been there for approximately 5 minutes, they were removed and a colposcope was used to look at the area.  The areas of infection were identified and a scalpel was then used to do wide local excision on the left labia, missing the clitoral hood to the vulva to the internal hymenal ring, out back over the edge with undermining of the tissues to remove the underlying subcutaneous fat. Multiple sutures were used to approximate the underlying tissue as well as the skin edges, in interrupted fashion. The laser was then turned on and the clitoral hood was lasered on continuous stream with intermittent pulses to be sure to burn the tissue. Good hemostasis was identified where the skin edges were  approximated.  No other areas were seen that needed attention. The patient was then laid supine and taken to recovery after having tolerated the procedure well.    ____________________________ Delsa Sale, MD cck:cc D: 08/08/2012 15:54:00 ET T: 08/08/2012 21:58:24 ET JOB#: [id number]  cc: Delsa Sale, MD, <Dictator> Delsa Sale MD ELECTRONICALLY SIGNED 08/24/2012 10:04

## 2014-06-12 NOTE — H&P (Signed)
PATIENT NAME:  Laura Chen, Laura Chen MR#:  [id number] DATE OF BIRTH:  04/06/1965  DATE OF ADMISSION:  09/05/2012  CHIEF COMPLAINT: Right chest pain and shortness of breath.   HISTORY OF PRESENT ILLNESS: I was called to the Emergency Room emergently for placement of a chest tube in a patient with a complete pneumothorax. She is a 49 year old obese female patient with multiple medical problems including CML and rheumatoid arthritis who presented with acute onset of chest pain and shortness of breath approximately 12 hours prior to coming to the Emergency Room. I was told that she was hemodynamically stable by Dr. Robet Leu. Dr. Robet Leu was not able to place a chest tube and she asked me to come to the Emergency Room and place a chest tube, which was performed.  See separate dictation.   PAST MEDICAL HISTORY: Rheumatoid arthritis, hypertension, morbid obesity, CML.   PAST SURGICAL HISTORY: None.   ALLERGIES: SULFA.   MEDICATIONS: Multiple, see chart.   FAMILY HISTORY: Noncontributory.   SOCIAL HISTORY: The patient stopped smoking several years ago. Does not drink alcohol.   REVIEW OF SYSTEMS:  Ten system review was performed and negative with the exception of that mentioned in the HPI.  PHYSICAL EXAMINATION: GENERAL: Morbidly obese, African American female patient with a BMI of 41, 248 pounds, 65 inches tall.  VITAL SIGNS: Temperature of 98.7, pulse 78, respirations 18, blood pressure 151/77, 97% room air room sats and pain scale of 5.  HEENT: No scleral icterus.  NECK: No palpable neck nodes.  LUNGS:  Chest is clear on the left side, near absent breath sounds on the right.  HEART:  Regular rate and rhythm.  ABDOMEN: Soft, nontender.  EXTREMITIES: Without edema.  NEUROLOGIC: Grossly intact.  INTEGUMENT: No jaundice.  EXTREMITIES: Calves are nontender. She is morbidly obese.   LABORATORY AND DIAGNOSTICS:  Chest x-ray is personally reviewed showing a right near complete pneumothorax with  slight shift of the mediastinum.   White blood cell count is 15.5, H and H of 8.9 and 30.4, creatinine 1.58 and a CO2 of 19.   ASSESSMENT AND PLAN: This is a patient who required emergency placement of a chest tube on the right side for near complete collapse of the right lobe.   Review of her records shows that she has seen Dr. Genevive Bi in the past for cavitary lesions, and I called Dr. Genevive Bi and asked him to see the patient, which he will do concerning the pneumothorax and the cavitary lesions. She is to be admitted to the hospital on suction and repeat chest x-rays will be performed. ____________________________ Jerrol Banana. Burt Knack, MD rec:sb D: 09/06/2012 14:44:39 ET T: 09/06/2012 15:32:29 ET JOB#: [id number]  cc: Jerrol Banana. Burt Knack, MD, <Dictator> Florene Glen MD ELECTRONICALLY SIGNED 09/07/2012 7:04

## 2014-06-12 NOTE — Consult Note (Signed)
PATIENT NAME:  Laura Chen, Laura Chen MR#:  [id number] DATE OF BIRTH:  1966/01/03  DATE OF CONSULTATION:  09/05/2012  REQUESTING PHYSICIAN: Dr. Jackquline Bosch.   CONSULTING PHYSICIAN: Louis Matte, M.D.   REASON FOR CONSULTATION: Management of chest tube and right lung pneumothorax.   I have personally seen and examined the patient. I have discussed her care with Dr. Delight Hoh her primary oncologist. I have discussed her care with Dr. Jackquline Bosch.   The patient is a 49 year old African American woman with a long-standing history of rheumatoid arthritis and a recent diagnosis of multiple enlarging bilateral pulmonary nodules. She was seen by me approximately two weeks ago in the office where we had recommend that she undergo lung biopsy for definitive diagnosis. She also has a history of CML and there was some concern that these may represent leukemic deposits in the lung or some opportunistic infection secondary to her protracted immunosuppressive therapy. In any event, she was thinking about her options when she experienced acute onset of shortness of breath. She came into the Emergency Department and Dr. Jackquline Bosch urgently placed a right-sided chest tube with full expansion of her right lung.   PHYSICAL EXAMINATION: She is an obese [place] American female in no distress at this time.   LUNGS: Markedly diminished bilaterally.   HEART: Regular.   ABDOMEN: Soft and nontender. She does have trace pedal edema bilaterally.   Her chest tube has a small air leak and is appropriately positioned on x-ray. Interrogation of the system reveals all connectors to be intact.   ASSESSMENT AND PLAN: I believe that she most likely suffered a pneumothorax from rupture of one of these parenchymal cavitary lesions. I explained to the patient the indications and risks of right thoracoscopy, probable thoracotomy with lung biopsy and talc pleurodesis. I told her that we would send the specimen for all the necessary  materials to rule out any infectious or malignant process. The risks of bleeding, infection, air leak and death were reviewed in the past. She would like [place] to proceed.   Thank you very much your kind referral.    ____________________________ Lew Dawes. Genevive Bi, MD teo:rw D: 09/05/2012 17:20:14 ET T: 09/05/2012 17:57:19 ET JOB#: [id number]  cc: Christia Reading E. Genevive Bi, MD, <Dictator> Louis Matte MD ELECTRONICALLY SIGNED 09/13/2012 8:08

## 2014-06-12 NOTE — Consult Note (Signed)
Chief Complaint:  Subjective/Chief Complaint Laura Chen seen by Dr Mortimer Fries over the weekend. She is known to me from the office and has a h/o cavitary lung nodules. i did a bronch on her which was negative and cultures for AFB were also negative she came in for PTX and has had a Chest tube placed and peurodesis done at this time   VITAL SIGNS/ANCILLARY NOTES: **Vital Signs.:   22-Jul-14 14:46  Vital Signs Type Routine  Temperature Temperature (F) 98  Celsius 36.6  Temperature Source oral  Pulse Pulse 62  Respirations Respirations 18  Systolic BP Systolic BP 627  Diastolic BP (mmHg) Diastolic BP (mmHg) 64  Mean BP 81  Pulse Ox % Pulse Ox % 100  Pulse Ox Activity Level  At rest  Oxygen Delivery 2L  *Intake and Output.:   Shift 22-Jul-14 15:00  Grand Totals Intake:  120 Output:  650    Net:  -530 24 Hr.:  -530  Oral Intake      In:  120  Urine ml     Out:  650  Length of Stay Totals Intake:  8235.53 Output:  4871    Net:  3364.53   Brief Assessment:  GEN no acute distress, obese   Cardiac Regular  -- thrills  -- JVD   Respiratory normal resp effort  clear BS  no use of accessory muscles   Gastrointestinal details normal Soft  Bowel sounds normal  No organomegaly   EXTR negative cyanosis/clubbing   Lab Results: Hepatic:  19-Jul-14 04:05   Bilirubin, Total 0.3  Alkaline Phosphatase  220  SGPT (ALT) 63  SGOT (AST)  40  Total Protein, Serum  6.3  Albumin, Serum  2.2  Routine Chem:  19-Jul-14 04:05   Glucose, Serum  155  BUN 12  Creatinine (comp) 1.20  Sodium, Serum 140  Potassium, Serum 4.7  Chloride, Serum  111  CO2, Serum 23  Calcium (Total), Serum  8.4  Osmolality (calc) 282  eGFR (African American) >60  eGFR (Non-African American)  55 (eGFR values <86mL/min/1.73 m2 may be an indication of chronic kidney disease (CKD). Calculated eGFR is useful in patients with stable renal function. The eGFR calculation will not be reliable in acutely ill patients when  serum creatinine is changing rapidly. It is not useful in  patients on dialysis. The eGFR calculation may not be applicable to patients at the low and high extremes of body sizes, pregnant women, and vegetarians.)  Anion Gap  6  Routine Hem:  19-Jul-14 04:05   WBC (CBC)  14.2  RBC (CBC)  3.35  Hemoglobin (CBC)  7.2  Hematocrit (CBC)  26.4  Platelet Count (CBC) 189 (Result(s) reported on 07 Sep 2012 at 05:21AM.)  MCV  79  MCH  21.5  MCHC  27.3  RDW  22.3  Bands 7  Segmented Neutrophils 83  Lymphocytes 4  Monocytes 5  Metamyelocyte 1  NRBC 1  Diff Comment 1 ANISOCYTOSIS  Diff Comment 2 POLYCHROMASIA  Diff Comment 3 MACROCYTES PRESENT  Diff Comment 4 TOXIC GRANULATION  Diff Comment 5 PLTS VARIED IN SIZE  Result(s) reported on 07 Sep 2012 at 05:21AM.   Radiology Results: XRay:    22-Jul-14 11:11, Chest PA and Lateral  Chest PA and Lateral   REASON FOR EXAM:    assess for pleural effusion  COMMENTS:       PROCEDURE: DXR - DXR CHEST PA (OR AP) AND LATERAL  - Sep 10 2012 11:11AM  RESULT: Comparison is made to the study of September 09, 2012.    The chest tube is unchanged in position. There is no evidence of a   significant residual pneumothorax. There remain increased perihilar lung   markings and increased density just above the right hemidiaphragm. The   left lung is hypoinflated. There may be minimal atelectasis just above   the hemidiaphragm. The cardiac silhouette is top normal in size and the   pulmonary vascularity is indistinct.    IMPRESSION:  I do not see evidence of a significant pleural effusion or     pneumothorax on the right. The chest tube is unchanged in position.   Overall there has not been significant interval change in the appearance   of the chest since yesterday's study.     Dictation Site: 2        Verified By: DAVID A. Martinique, M.D., MD   Assessment/Plan:  Assessment/Plan:  Assessment 1. Cavitary pulmonary nodules.  -PTX likely related to  rupture of the underlying cavitary lesion. She still has a leak with cough seems to be more pronounced. -she is back on steroids -also has some dark sputum noted. will send this for cultures -if leak persists would be concerned about BP fistula   Electronic Signatures: Allyne Gee (MD)  (Signed 22-Jul-14 15:08)  Authored: Chief Complaint, VITAL SIGNS/ANCILLARY NOTES, Brief Assessment, Lab Results, Radiology Results, Assessment/Plan   Last Updated: 22-Jul-14 15:08 by Allyne Gee (MD)

## 2014-06-12 NOTE — Consult Note (Signed)
Chief Complaint:  Subjective/Chief Complaint she is sitting up in chair. denies any pain. the leak seems to be much improved. No fevers   VITAL SIGNS/ANCILLARY NOTES: **Vital Signs.:   23-Jul-14 10:25  Pulse Pulse 71    14:49  Vital Signs Type Routine  Temperature Temperature (F) 98.2  Celsius 36.7  Temperature Source oral  Pulse Pulse 66  Respirations Respirations 18  Systolic BP Systolic BP 734  Diastolic BP (mmHg) Diastolic BP (mmHg) 79  Mean BP 99  Pulse Ox % Pulse Ox % 98  Pulse Ox Activity Level  At rest  Oxygen Delivery 2L  *Intake and Output.:   Shift 23-Jul-14 15:00  Grand Totals Intake:   Output:  400    Net:  -400 24 Hr.:  -400  Urine ml     Out:  400  Length of Stay Totals Intake:  9285.53 Output:  1937    Net:  3114.53   Brief Assessment:  GEN no acute distress, obese   Cardiac Regular  -- thrills  -- JVD   Respiratory normal resp effort  clear BS  no use of accessory muscles   Gastrointestinal details normal Soft  Nontender  Bowel sounds normal   EXTR negative cyanosis/clubbing   Lab Results: Routine Chem:  23-Jul-14 05:15   Result Comment NRBCs - NRBCs seen on scan. Order an AUTOMATED  - DIFFERENTIAL IF CLINICALLY INDICATED.  - TPL  Result(s) reported on 11 Sep 2012 at 05:54AM.  Routine Hem:  23-Jul-14 05:15   Hemoglobin (CBC)  8.4   Radiology Results: XRay:    23-Jul-14 06:14, Chest Portable Single View  Chest Portable Single View   REASON FOR EXAM:    assess for pleural effusion  COMMENTS:       PROCEDURE: DXR - DXR PORTABLE CHEST SINGLE VIEW  - Sep 11 2012  6:14AM     RESULT: Comparison: 09/10/2012, 09/09/2012    Findings:     Single portable AP chest radiograph is provided. There is a right-sided   chest tube directed towards the apex. There is no right pneumothorax. The   left lung is clear. There is mild right lung atelectasis. There is no   pleural effusion. Normal cardiomediastinal silhouette. The osseous   structures are  unremarkable.  IMPRESSION:     Right-sided chest tube without pneumothorax or pleural effusion.    Dictation Site: 1        Verified By: Jennette Banker, M.D., MD   Assessment/Plan:  Assessment/Plan:  Assessment 1. Cavitary pulmonary nodules with Pneumothorax -she is clinically doing better.  -it is encouraging that there is no leak today on water seal -f/u CXR looks good hopefully tube can be taken out soon   Electronic Signatures: Allyne Gee (MD)  (Signed 23-Jul-14 15:06)  Authored: Chief Complaint, VITAL SIGNS/ANCILLARY NOTES, Brief Assessment, Lab Results, Radiology Results, Assessment/Plan   Last Updated: 23-Jul-14 15:06 by Allyne Gee (MD)

## 2014-06-12 NOTE — H&P (Signed)
PATIENT NAME:  Laura Chen, Laura Chen MR#:  [id number] DATE OF BIRTH:  [birth year]  DATE OF ADMISSION:  10/05/2012  PRIMARY CARE PROVIDER: Dr. Clayborn Bigness   ED REFERRING PHYSICIAN:  Dr. Benjaman Lobe.   CHIEF COMPLAINT: Coughing up blood.   HISTORY OF PRESENT ILLNESS: The patient is a 49 year old African American female with a history of rheumatoid arthritis. History of lupus, history of shingles history of peptic ulcer disease, CML, history of having multiple nodules in the right lower lung felt to be due to rheumatoid arthritis, who was hospitalized here a few months prior with pneumothorax. She states that she was in good health up until this morning. When she got up, she started coughing, which is new for her and she started off coughing up bright red blood. She reports that she had a few episodes filling up a half cup of blood.  Then by the time she got to the ED it stopped. She reports that she has not been coughing recently, has not had any fevers or chills. Is chronically on 2 liters of oxygen. Her rheumatoid arthritis continues to be under labile control. She otherwise denies any chest pains, palpitations. No syncope. No abdominal pain, nausea, vomiting or diarrhea or any urinary symptoms.   PAST MEDICAL HISTORY:   1.  Significant for rheumatoid arthritis.  2.  Lupus, history of shingles.  3.  History of for bleeding peptic ulcer.  4.  History of multinodular goiter.  5.  History of CML. Currently on oral chemotherapy on a daily basis.  6.  History of fibroid resection.  7.  VIN-3 status post resection.   ALLERGIES: SULFA DRUGS, TYLENOL AND TAPE.  CURRENT MEDICATIONS: Aspirin 81 mg 1 tab p.o. daily, atenolol 25 p.o. daily, bumetanide 1 mg 1 tab p.o. b.i.d., calcium carbonate 600, 1 tab p.o. b.i.d., estradiol norephedrine 0.5 to -0.1 mg 1 tab p.o. daily, fexofenadine 180 daily, ibuprofen 800 one tab p.o. t.i.d., Iclusig 15 mg daily, omeprazole 20 daily, oxycodone 10 mg 1 tab p.o. t.i.d., prednisone  15 mg daily, tramadol 50, two tabs 3 times a day, vitamin D3 2000 international units daily, Voltaren topical 1% apply to affected area 2 times a day.   SOCIAL HISTORY: History of smoking in the past,  quit 2 years ago. Denies any alcohol or drug use.   FAMILY HISTORY: Positive for hypertension and heart disease.   REVIEW OF SYSTEMS:    CONSTITUTIONAL: Denies any fevers. Complains of chronic fatigueness, weakness, pain related to her rheumatoid arthritis. Complains of weight loss.  EYES: No blurred or double vision. No pain. No redness. No inflammation. No glaucoma. No cataracts.  ENT: No tinnitus. No ear pain. No hearing loss. No seasonal or year-round allergies. No epistaxis. No nasal discharge. No snoring. No postnasal drip. No difficulty swallowing.  RESPIRATORY: Complains of cough, but no wheezing complains any hemoptysis. Complains of dyspnea. No COPD or TB.  CARDIOVASCULAR: Denies any chest pain or orthopnea. Has chronic edema related to her prednisone. She reports no arrhythmia. No palpitations.  GASTROINTESTINAL: No nausea, vomiting, diarrhea. No abdominal pain. No hematemesis. No melena. No OBS. No jaundice.  GENITOURINARY: Denies any dysuria, hematuria, renal calculus or frequency.  ENDOCRINE: Denies any polyuria, nocturia or thyroid problems. HEMATOLOGIC/LYMPHATIC: Has history of iron deficiency anemia requiring transfusion in the past. Denies any easy bruising of bleeding.  SKIN: No acne. No rash. No changes in mole, hair or skin.  MUSCULOSKELETAL: Relates pain related to her rheumatoid arthritis.  NEUROLOGIC:  No numbness. No  CVA. No TIA. No seizures.  PSYCHIATRIC: No anxiety. No insomnia. No ADD. No OCD.   PHYSICAL EXAMINATION: VITAL SIGNS: Temperature 99.5, pulse 77, respirations 18, blood pressure 120/59, O2 100% on 2 liters.  GENERAL: The patient is an obese [place] American female who appears comfortable, in no acute distress.  HEENT: Head atraumatic, normocephalic. Pupils  are equal, round, reactive to light and accommodation. There is no conjunctival pallor. No scleral icterus. Extraocular movements intact. Nasal exam shows no lesion or drainage.  Ears:  There is no drainage or external lesions. Mouth: No exudates.   NECK: Supple and symmetric. No masses. Thyroid midline, not enlarged. No JVD.  RESPIRATORY: She has bilateral rhonchi throughout both lungs. There is no wheezing. No rales or crackles. No accessory muscle usage.  CARDIOVASCULAR: Regular rate and rhythm. No murmurs, gallops, clicks or heaves. PMI is not displaced. ABDOMEN: Soft, nontender, nondistended. Positive bowel sounds x4. No hepatosplenomegaly.  GENITOURINARY: Deferred.  MUSCULOSKELETAL: She has arthritic changes related to her rheumatoid arthritis without any active swelling or erythema.  SKIN: There is no rash.  LYMPHATICS: No lymph nodes palpable in the cervical lymph nodes.  VASCULAR: Good DP, PT pulses.  NEUROLOGICAL: Cranial nerves II through XII grossly intact. No focal deficits. Strength 5 out of 5 in both extremities.  PSYCHIATRIC: Not anxious or depressed.   Evaluations of CT scan of the chest showed abnormal appearance in the right lower lobe bronchus, multiple cavitary lesions. No PE. Urine pregnancy negative. BMP: Glucose 138, BUN 12, creatinine 1.06, sodium 138, potassium 3.7, chloride 100, CO2 is 30, calcium 8.9. LFTs: Bilirubin total 0.5, alkaline phosphatase of 248, ALT is 83, AST is 65, total protein 7.1, albumin is 2.3. WBC 11.0, hemoglobin 8.9, platelet count is 143. INR is 1.0.   ASSESSMENT AND PLAN: The patient is a 49 year old African American female with rheumatoid lung disease, lupus, history of chronic myelogenous leukemia, peptic ulcer disease presents with coughing up blood.  1.  Hemoptysis likely related to her rheumatoid lung disease. The patient has had a lung biopsy in the past, which confirmed rheumatoid lung.  At that time a few months prior, AFB from the lung  biopsy was negative as well as findings were negative for malignancy, so this could be just hemoptysis related to her rheumatoid lung. At this time, we will monitor her hematocrit and monitor and monitor for any large volume hemoptysis. We will hold off on her aspirin and ibuprofen. I will start on empiric Levaquin. This is unlikely tuberculosis, but in light of her having new cough and hemoptysis, we will place her on isolation until seen by Dr. Humphrey Rolls, who is her regular pulmonologist.  2.  Rheumatoid, arthritis. We will continue prednisone as taking at home.  3.  Chronic myelogenous leukemia. We will continue her oral regimen, that she has been taking at home.  4.  Gastroesophageal reflux disease. We will continue omeprazole.  5.  In light of her hemoptysis, I will hold any type of anticoagulation for deep vein thrombosis prophylaxis.   NOTE:  50 minutes spent on this H and P.  ____________________________ Lafonda Mosses. Posey Pronto, MD shp:dp D: 10/05/2012 13:50:44 ET T: 10/05/2012 14:19:58 ET JOB#: [id number]  cc: Nishawn Rotan H. Posey Pronto, MD, <Dictator> Alric Seton MD ELECTRONICALLY SIGNED 10/07/2012 16:18

## 2014-06-12 NOTE — Discharge Summary (Signed)
PATIENT NAME:  Laura Chen, Laura Chen MR#:  [id number] DATE OF BIRTH:  October 10, 1965  DATE OF ADMISSION:  09/05/2012 DATE OF DISCHARGE:  09/13/2012  ADMITTING DIAGNOSIS:  Spontaneous pneumothorax, right side.   DISCHARGE DIAGNOSES:  1.  Spontaneous pneumothorax, right side.  2.  Rheumatoid arthritis.  3.  Chronic myelogenous leukemia.  4.  Morbid obesity.  5.  Lupus erythematosus.  6.  Asthma.   OPERATION PERFORMED:  1.  Insertion of right-sided chest tube.  2.  Right thoracotomy with biopsy of right lower lobe and talc pleurodesis.   HOSPITAL COURSE:  Laura Chen is a 49 year old African American woman with a long-standing history of rheumatoid arthritis.  She has had multiple pulmonary nodules develop over the last several months and she was being worked up as an outpatient for this.  Many of these were cavitary and subpleural in location and she was admitted to the hospital on the 17th of July after sustaining a spontaneous pneumothorax.  The patient presented with shortness of breath and a chest tube was inserted with prompt re-expansion of the lung.  The patient was taken to the operating room the next day where she underwent a right thoracoscopy with lung biopsy and talc pleurodesis.  The final pathology was all consistent with rheumatoid lung disease without any evidence of malignancy or granulomatous infectious process.  The patient's hospital course was essentially uncomplicated.  She had a small air leak for several days, but this resolved.  On the day of her discharge, her chest tube was clamped for 6 hours and a chest x-ray was obtained prior to and after clamping the tube.  There was no evidence of a pneumothorax and the chest tube was removed.  At the time of discharge, her incisions were healing as expected.  Her discharge medications are as listed.  A single prescription was written for oxycodone 10 mg tablets to take 10 mg by mouth every 8 hours around the clock, #60, without refills  was prescribed.  The patient's wounds were free of any infection and she was given instructions on wound care management.  She will come back to see me in the clinic in 5 to 7 days for a routine follow-up.  She will also see Dr. Grayland Ormond at that time.   Thank you very much for allowing me to participate in her care today.    ____________________________ Lew Dawes. Genevive Bi, MD teo:ea D: 09/13/2012 15:46:41 ET T: 09/13/2012 22:46:31 ET JOB#: [id number]  cc: Christia Reading E. Genevive Bi, MD, <Dictator> Louis Matte MD ELECTRONICALLY SIGNED 09/19/2012 8:46

## 2014-06-12 NOTE — Discharge Summary (Signed)
PATIENT NAME:  Laura Chen, Laura Chen MR#:  [id number] DATE OF BIRTH:  07/30/1965  ADMITTING PHYSICIAN: Dr. Dustin Flock.   DISCHARGING PHYSICIAN: Dr. Gladstone Lighter, M.D.   PRIMARY CARE PHYSICIAN: Dr. Clayborn Bigness.   CONSULTATIONS IN THE HOSPITAL: Pulmonary consultation with Dr. Devona Konig.   DISCHARGE DIAGNOSES:  1.  Hemoptysis.  2.  Progressively worsening rheumatoid lung disease with multiple cavitary lung lesions.  3.  Rheumatoid arthritis, seems refractory.  4.  History of chronic myeloblastic leukemia.  5.  Anemia of chronic disease.   DISCHARGE MEDICATIONS:  1.  Vitamin D3, 2000 international units p.o. daily.  2.  Tramadol 100 mg p.o. t.i.d.  3.  Oxycodone 10 mg p.o. t.i.d.  4.  Calcium carbonate 600 mg p.o. b.i.d.  5.  Prednisone 15 mg p.o. daily.  6.  Bumex 1 mg p.o. b.i.d.  7.  Atenolol 25 mg p.o. daily.  8.  Estradiol norephedrine 0.5 mg/0.1 mg p.o. daily.  9.  Fexofenadine 180 mg p.o. daily.  10.  Iclusig 15 mg p.o. daily.  11.  Omeprazole 20 mg p.o. daily.  12.  Voltaren gel topically twice a day as needed for pain.  13.  Augmentin 875 mg tablet 1 tablet p.o. b.i.d. for five more days.   DISCHARGE DIET: Regular diet.   DISCHARGE ACTIVITY: As tolerated.    FOLLOWUP INSTRUCTIONS:  1.  Follow up with [facility] pulmonary medicine in two weeks.  2.  Follow up with the rheumatologist at [facility], Dr. Sarina Ill, at first available appointment.  3.  Oncology follow-up with Dr. Grayland Ormond in 3 to 4 weeks.   LABORATORY AND IMAGING STUDIES PRIOR TO DISCHARGE: Sodium 137, potassium 3.0, chloride 101, bicarbonate 30, BUN 8, creatinine 0.88, glucose 88, calcium of 9.1. ESR was greater than 140. WBC 10.1, hemoglobin 8.3, hematocrit 26.4, platelet count 132.   Blood cultures remain negative.   CT of the chest showing abnormal appearance of the right lower lobe bronchus with multiple cavitary lesions. Infectious or inflammatory process also  possible. However, the cavitary lesions are similar  to her old CT scan done from June 2014.   Troponins remain negative and INR is 1.0.  BRIEF HOSPITAL COURSE: Laura Chen is an unfortunate 49 year old obese African American female with multiple medical problems including refractory rheumatoid arthritis, worsening rheumatoid lung disease, CML refractory to multiple treatments, who presents to the hospital secondary to hemoptysis. The patient has a recent hospitalization about a month ago for  spontaneous pneumothorax at which time bronchial biopsies have revealed rheumatoid lung disease.  She was supposed to follow up with her rheumatologist and she was in the process of being started on a new study drug as she has failed multiple immunomodulator treatments in the past.    Hemoptysis. Initially placed in isolation until TB was ruled out. However, seen by Dr. Humphrey Rolls from pulmonary who said that recent bronch cultures have been negative for AFB stain and also cultures. However, the patient already had 2 AFB sputums which were negative during this hospitalization. Isolation was discontinued. Most likely cause of her hemoptysis is secondary ti  worsening of her rheumatoid lung disease. She had a CT chest which showed multiple cavitary lesions and which was stable from previous CT from June 2014. The patient was taking aspirin at home for her CML chemo to prevent clots.   Also of note, she mentioned that she was taking more ibuprofen to help with her rheumatoid arthritis pain. So it could be a combination of ibuprofen with aspirin  and already having cavitary  lesions in the lung that could have precipitated her hemoptysis. She had only 1 or 2 episodes of hemoptysis on the day of admission here in the hospital and later improved.   All her other home medications were being continued. She was advised to stay away from ibuprofen but she will need to take the aspirin for her CML medicine Iclusig. She was advised to follow up with Dr. Grayland Ormond for the same. Also  strongly advised to follow up with her rheumatologist, Dr. Sarina Ill at [facility], at the first available visit. Dr. Humphrey Rolls recommended that [facility] with her worsening rheumatologic lung disease.   Her course has been otherwise uneventful in hospital.   DISCHARGE CONDITION: Stable.   DISCHARGE DISPOSITION: Home.   TIME SPENT ON DISCHARGE: 45 minutes.    ____________________________ Gladstone Lighter, MD rk:np D: 10/09/2012 14:53:19 ET T: 10/09/2012 20:07:21 ET JOB#: [id number]  cc: Gladstone Lighter, MD, <Dictator> Kathlene November. Grayland Ormond, MD  Gladstone Lighter MD ELECTRONICALLY SIGNED 10/14/2012 22:16

## 2014-06-12 NOTE — Op Note (Signed)
PATIENT NAME:  Laura Chen, Laura Chen MR#:  [id number] DATE OF BIRTH:  May 17, 1965  DATE OF PROCEDURE:  09/06/2012  SURGEON:  Christia Reading E. Genevive Bi, M.D.   ASSISTANT:  Jackquline Bosch, M.D.   PREOPERATIVE DIAGNOSIS:  Multiple bilateral pulmonary nodules with spontaneous pneumothorax, right side.   POSTOPERATIVE DIAGNOSIS:  Multiple bilateral pulmonary nodules with spontaneous pneumothorax, right side (frozen section consistent with rheumatoid nodule).   OPERATION PERFORMED: 1.  Preoperative bronchoscopy to assess endobronchial anatomy.  2.  Right thoracoscopy with conversion to right thoracotomy and wedge resection of two right lower lobe lesions.  3.  Talc pleurodesis.   INDICATIONS FOR PROCEDURE:  Ms. Metsker is a 49 year old African American woman with a history of CML and rheumatoid arthritis.  She has had enlarging bilateral pulmonary nodules and presented to the Emergency Department with a tension pneumothorax yesterday.  The patient had been previously seen by me two weeks prior and she was apprised of the indications and risks of thoracoscopy, possible thoracotomy and lung biopsy.  She gave her informed consent.   DESCRIPTION OF PROCEDURE:  The patient was brought to the operating suite and placed in a supine position.  General endotracheal anesthesia was given with a double-lumen tube.  Preoperative bronchoscopy was carried out.  The endobronchial anatomy was normal.  There were some white mucoid material scattered throughout the major airways.  The distal airways were clean.  There was no obvious pneumonia and there was no blood.  The patient was then turned for a right thoracoscopy.  All pressure points were carefully padded.  The patient was prepped and draped in the usual sterile fashion.  The prior chest tube had been removed.  A skin incision was made superior to the prior chest tube incision and extended for a distance of approximately 4 cm.  We attempted to do get down to the chest wall, but because  of the patient's very large size, we had to make a much longer skin incision which ultimately was approximately 10 cm in size.  The latissimus and serratus muscles were divided and ultimately the chest was entered.  A thoracoscope was introduced and we could see that there were extensive adhesions within the pleural space already.  These were swept down with a gauzed pledget.  We attempted to identify the posterior portion of the lung, but the incision was so difficult to work through we had to extend it a little bit further.  We then placed a small retractor to spread the ribs sufficiently to get the stapler in.  The inferior aspect of the lower lobe had two nodules on the periphery.  These were grasped and were excised.  Portions were sent for appropriate culture and for permanent section.  A frozen section was consistent with rheumatoid lung nodules.  The chest was copiously irrigated and the pleural space had some fibrinous debris.  This was also sent for culture as well.  The chest was then drained with a 32 French chest tubes inserted inferiorly.  The ribs were reapproximated with #2 Vicryl.  Before the ribs were brought together completely a wet pad was placed in the depths of the wound and approximately 4 grams of sterile talc were then insufflated under direct visualization coating the entire pleural space.  The ribs were then closed.  The muscles of the chest wall were closed with #2 Vicryl, the subcutaneous tissues with 2-0 Vicryl and the skin with skin clips.  The patient was then dressed with sterile dressings and was rolled  in the supine position.  She was extubated and taken to the recovery room in stable condition.     ____________________________ Lew Dawes Genevive Bi, MD teo:ea D: 09/06/2012 16:44:44 ET T: 09/06/2012 17:02:04 ET JOB#: [id number]  cc: Christia Reading E. Genevive Bi, MD, <Dictator> Allyne Gee, MD Kathlene November. Grayland Ormond, MD Louis Matte MD ELECTRONICALLY SIGNED 09/13/2012 8:08

## 2014-06-12 NOTE — Consult Note (Signed)
Brief Consult Note: Diagnosis: pneumothorax.   Patient was seen by consultant.   Consult note dictated.   Orders entered.   Comments: 49 yo female with Lupus and RA, CML,  -pneumothorax. s/p CT placement  Dr. Genevive Bi take for surgey tomorrow  npo,  -AKI: due to iv vol deplession IVF at 40ml/hr. -acute resp failure sec to pneumithorax. was on non rebreather now 4L [place]. wean off.  Electronic Signatures: James Ivanoff, Roselie Awkward (MD)  (Signed 17-Jul-14 17:58)  Authored: Brief Consult Note   Last Updated: 17-Jul-14 17:58 by James Ivanoff, Roselie Awkward (MD)

## 2014-06-12 NOTE — Op Note (Signed)
PATIENT NAME:  Laura Chen, Laura Chen MR#:  [id number] DATE OF BIRTH:  Feb 09, 1966  DATE OF PROCEDURE:  09/05/2012.  PREOPERATIVE DIAGNOSIS:  Right pneumothorax.   POSTOPERATIVE DIAGNOSIS:  Right pneumothorax.  PROCEDURE:  Right chest tube placement.   SURGEON:  Petrona Wyeth E. Burt Knack, M.D.   ANESTHESIA:  Local anesthetic.   INDICATIONS:  This is a patient with a right pneumothorax. She has multiple medical problems and presented short of breath. She requires emergent chest tube placement. She has been seen by Dr. Genevive Bi in the past for cavitary lesions of the lung, possibly related to her CML.   The risks were discussed with her. She understood and agreed to proceed.   FINDINGS:  Postoperative chest film demonstrated good chest tube placement with complete reinflation of her right lung and a large air leak.   DESCRIPTION OF PROCEDURE:  The patient was rolled to her left side and prepped and draped in a sterile fashion. Her morbid obesity made this procedure somewhat difficult. The local anesthetic was infiltrated in the skin and subcutaneous tissues. The needle would not reach the pleural space. An incision was made and the 20-French trocar-type chest tube was advanced until it entered the pleural space, and a large gush of air returned, and the trocar was removed. The chest tube was advanced and connected to the [facility] drain system. An air leak was noted. The catheter was sutured to the skin of the chest with multiple 2-0 and 0 silks, and a sterile dressing was placed.   A postoperative chest film demonstrated the above. The patient tolerated the procedure well. She was left in stable condition in the ICU to be admitted to the hospital for observation. Dr. Genevive Bi will be consulted as well.  ____________________________ Jerrol Banana. Burt Knack, MD rec:jm D: 09/05/2012 16:30:21 ET T: 09/05/2012 17:24:36 ET JOB#: [id number]  cc: Jerrol Banana. Burt Knack, MD, <Dictator> Florene Glen MD ELECTRONICALLY  SIGNED 09/06/2012 9:16

## 2014-06-13 NOTE — Consult Note (Signed)
PATIENT NAME:  Laura Chen, Laura Chen MR#:  [id number] DATE OF BIRTH:  [birth year]  DATE OF CONSULTATION:  09/05/2012  REFERRING PHYSICIAN:  Dr. Phoebe Perch  CONSULTING PHYSICIAN:  [place] Sink, MD  PRIMARY CARE PHYSICIAN:  Dr. Clayborn Bigness.   CHIEF COMPLAINT:  Chest pain and shortness of breath.   HISTORY OF PRESENT ILLNESS:  Ms. Laura Chen is a very nice 49 year old female who has history of multiple medical problems including CML currently on treatment by Dr. Grayland Ormond with Iclusig.  She also has history of rheumatoid arthritis, lupus, history of previous bleeding ulcers, peptic ulcer disease and a nodular goiter.  The patient comes today with a history of sudden onset chest pain and sudden onset shortness of breath last night while she was just sitting down watching TV.  She was not able to sleep very well.  She was very short of breath and the problem was not getting any better, for what she decided to come here to the ER today.  Apparently, the patient had never had any problems like this before.  She occasionally gets short of breath due to her weight whenever she gets around, but not as severe as this.  Her chest pain was mostly on the right side of her chest.  She had 10 out of 10 intensity when it happened, then it slowed down as she rested and for the most before the chest tube was placed she was having pain of 7 to 8 out of 10 and the pain was a pressure-like, very piercing.  No radiation.  Not getting better with changes in position, worse with deep breath.  The patient was evaluated in the ER.  Chest x-ray showed a pneumothorax, near to total of the right lung for what Dr. Burt Knack was consulted.  Dr. Burt Knack came up and put a chest tube in the patient and her oxygen levels were starting to get better.  Her oxygen saturation was 95 on the nonrebreather.  Her heart rate was around 100 and she was breathing above 20 a minute.  The patient is better now after the chest tube, just  complaining of soreness.  I was able to evaluate the patient after her procedure and I was asked to do that just for medical management of chronic conditions.   The patient is going to be taken for a thoracoscopy and possible thoracotomy with a lung biopsy tomorrow due to multiple lung nodules and cavitary lesions.   REVIEW OF SYSTEMS:  A 12 system review of systems is done.  CONSTITUTIONAL:  The patient denies any fever.  Positive fatigue.  Positive weakness on a regular basis.  No significant weight loss or weight gain.  EYES:  No double vision, blurry vision or inflammation.  EARS, NOSE, THROAT:  No difficulty swallowing, no tinnitus.  No sinus pain.  RESPIRATION:  As in H and P.  Positive shortness of breath.  Positive chest pain with respiration.  The patient denies any COPD.  She used to smoke.  No TB in the past.  No significant cough.  GASTROINTESTINAL:  No nausea, vomiting, abdominal pain, constipation or diarrhea.  GENITOURINARY:  No dysuria, hematuria, changes in frequency.   BREAST:  No breast masses.  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  ENDOCRINOLOGY:  No polyuria, polydipsia, polyphagia, cold or heat intolerance.  The patient has a goiter, apparently has been asymptomatic.  MUSCULOSKELETAL:  No significant neck pain, back pain, shoulder pain or gout.  NEUROLOGIC:  No numbness, tingling.  No CVAs, TIAs or ataxia.  PSYCHIATRIC:  No significant depression or anxiety.   SKIN:  No rashes, petechiae or new lesions.  HEMATOLOGY AND ONCOLOGY:  The patient had a history of CML for what she is currently on chemotherapy, followed by Dr. Grayland Ormond.   PAST MEDICAL HISTORY: 1.  Rheumatoid arthritis.  2.  Lupus.  3.  History of shingles.  4.  Peptic ulcer, status post GI bleeding.  5.  Multinodular goiter.  6.  Bulbar dysplasia.  7.  Steroid dependent.  8.  Hypertension.   ALLERGIES:  SULFA DRUGS GIVE HER HIVES.  OCCASIONAL  TYLENOL GIVES HER ABDOMINAL UPSET.   SOCIAL HISTORY:  The patient used to smoke.  She quit about 2012.  She used to smoke  of a pack a day for over 15 years.  She denies any alcohol use.  She is on disability.  FAMILY HISTORY:  Positive for hypertension, heart disease in parents.    PAST SURGICAL HISTORY:  Lung biopsy in the past, bulbar biopsy and fibroma removed from her uterus.   MEDICATIONS:  Oxycodone 10 mg 3 times daily as needed for pain, amiloride 5 mg, take 4 tablets once a day, vitamin D 2000 units once a day, tramadol 50 mg take 2 tablets 3 times a day, ibuprofen 800 mg 3 times a day, Allegra 24 hours once a day, MiraLAX once daily, aspirin 81 mg once daily, calcium carbonate 600 mg twice daily, prednisone 5 mg take 3 tablets once a day,  budesonide 1 mg twice daily, atenolol 25 mg once a day, Iclusig 15 mg once a day, omeprazole 20 mg once daily, Activella 1 mg/0.5 mg once a day.   PHYSICAL EXAMINATION: VITAL SIGNS:  Blood pressure 147/68, pulse 81, respirations 18, temperature 98.7.  GENERAL:  The patient is alert, oriented x 3, no acute distress.  No respiratory distress.  Hemodynamically stable.  HEENT:  Her pupils are equal and reactive.  Extraocular movements are intact.  Mucosa are moist.  Anicteric sclerae.  Pink conjunctivae.  No oral lesions.  No oropharyngeal exudates.  NECK:  Supple.  No JVD.  No thyromegaly.  No adenopathy.  Obese.  No masses.  No rigidity.  The patient's has history of goiter.  I cannot palpate it right now.  CARDIOVASCULAR:  Regular rate and rhythm.  No murmurs, rubs or gallops.  No displacement of PMI.  LUNGS:  Respiratory sounds are decreased on right lung.  Chest tube placed, no significant leak.  No use of accessory muscles at this moment.  No dullness to percussion.  Left lung sounds are clear.  ABDOMEN:  Soft, nontender, nondistended.  No hepatosplenomegaly.  No masses.  Bowel sounds are positive, obese.  GENITAL:  Negative for external lesions.   EXTREMITIES:  Positive edema of the lower extremities, +2 pitting edema which is chronic for what the patient takes budesonide.  SKIN:  Without any rashes or petechiae.  MUSCULOSKELETAL:  No significant joint abnormalities on the lower extremity.  Upper extremity has beginnings of a swan deformity from her rheumatoid arthritis and there is some mild swelling of the interphalangic and carpal phalangic joints.  LYMPHATIC:  Negative for lymphadenopathies in the neck or supraclavicular areas.  VASCULAR:  Good pulses, capillary refill less than 3 seconds.   LABORATORY, DIAGNOSTIC AND RADIOLOGICAL DATA:  EKG, normal sinus rhythm.  No ST depression or elevation.  Chest x-ray, as mentioned above, pneumothorax of the right lung.  Glucose 134, BUN  17, creatinine 1.58.  Her last creatinine was 1.25 and that was on June 12th, chloride 108, CO2 19, GFR 39, lipase is 57.  Total protein 7.8, albumin 3, alkaline phosphatase 341, AST is 81, ALT 124, white count is 15.5, hemoglobin 8.9, platelet count was 227.  The patient has had acid fast cultures done in the past; they were negative.  A biopsy of the vulva shows high-grade dysplasia.  Bronchial washing done in June showed no malignant cells, just increased macrophages.   ASSESSMENT AND PLAN:  This is a 49 year old female which I am being asked to see as a consultation to manage medical problems.  The patient comes with severe chest pain and shortness of breath.  She is found to have a pneumothorax.  Chest tube placed.  1.  Acute respiratory failure.  The patient is admitted with increased oxygen demand and decreased oxygen supply to the lungs.  The patient is being evaluated by Dr. Burt Knack, had a pneumothorax and she previously was on a nonrebreather.  After the chest tube placement she is on 4 liters nasal cannula and titrated down.  There is no leak in the tube.  2.  Pneumothorax status post chest tube placement, likely temporary.  Dr. Genevive Bi has been consulted and he  is going to take the patient tomorrow for surgery for a thoracoscopy, possible thoracotomy and lung biopsy due to her nodules and cavitary lesions.  3.  Presurgical clearance.  The patient is moderate risk for surgery, but she needs to have the procedure done.  The patient has good control of her blood pressure.  She is on a beta-blocker.  Continue beta-blocker along the surgery.  The patient is going to be nothing by mouth after midnight.  We are going to hold on on chemical prophylaxis for DVT as the patient has surgery in the morning and she has history of severe peptic ulcer bleeding.  4.  Acute kidney injury.  The patient has good elevation of her creatinine more than 4 points from baseline.  We are going to start her on IV fluids and monitor constantly.  She does have history of chronic edema for what she takes budesonide.  We are going to hold the diuretic, give her IV fluids as she looks intravascular depleted and monitor her edema.  No history of congestive heart failure.  5.  Anemia of chronic disease, stable at baseline.  6.  Increased white blood cells, likely secondary to her pneumothorax distress.  7.  No need for antibiotics at this moment unless clinically indicated.  8.  Chronic lymphocytic leukemia, stable.  9.  Lupus, rheumatoid arthritis, is stable.  10.  Continue steroids at oral regimen.   I spent about 40 minutes with this patient on medical consult.     ____________________________ [place] Sink, MD rsg:ea D: 09/05/2012 18:18:10 ET T: 09/05/2012 22:20:22 ET JOB#: [id number]  cc:  Sink, MD, <Dictator> Caia Lofaro America Brown MD ELECTRONICALLY SIGNED 09/15/2012 0:33

## 2014-07-01 DIAGNOSIS — H53451 Other localized visual field defect, right eye: Secondary | ICD-10-CM | POA: Diagnosis not present

## 2014-07-01 DIAGNOSIS — G894 Chronic pain syndrome: Secondary | ICD-10-CM | POA: Diagnosis not present

## 2014-07-01 DIAGNOSIS — H268 Other specified cataract: Secondary | ICD-10-CM | POA: Diagnosis not present

## 2014-07-22 DIAGNOSIS — I10 Essential (primary) hypertension: Secondary | ICD-10-CM | POA: Diagnosis not present

## 2014-07-22 DIAGNOSIS — R7301 Impaired fasting glucose: Secondary | ICD-10-CM | POA: Diagnosis not present

## 2014-07-22 DIAGNOSIS — M069 Rheumatoid arthritis, unspecified: Secondary | ICD-10-CM | POA: Diagnosis not present

## 2014-07-22 DIAGNOSIS — C9210 Chronic myeloid leukemia, BCR/ABL-positive, not having achieved remission: Secondary | ICD-10-CM | POA: Diagnosis not present

## 2014-07-22 DIAGNOSIS — E876 Hypokalemia: Secondary | ICD-10-CM | POA: Diagnosis not present

## 2014-07-29 DIAGNOSIS — H209 Unspecified iridocyclitis: Secondary | ICD-10-CM | POA: Diagnosis not present

## 2014-07-29 DIAGNOSIS — M069 Rheumatoid arthritis, unspecified: Secondary | ICD-10-CM | POA: Diagnosis not present

## 2014-07-29 DIAGNOSIS — C9290 Myeloid leukemia, unspecified, not having achieved remission: Secondary | ICD-10-CM | POA: Diagnosis not present

## 2014-07-29 DIAGNOSIS — H2513 Age-related nuclear cataract, bilateral: Secondary | ICD-10-CM | POA: Diagnosis not present

## 2014-08-16 IMAGING — CR DG CHEST 1V PORT
1 series · 1 of 1 positions shown · non-contrast
Comparison: none

REASON FOR EXAM: PTX
COMMENTS:

[ap]
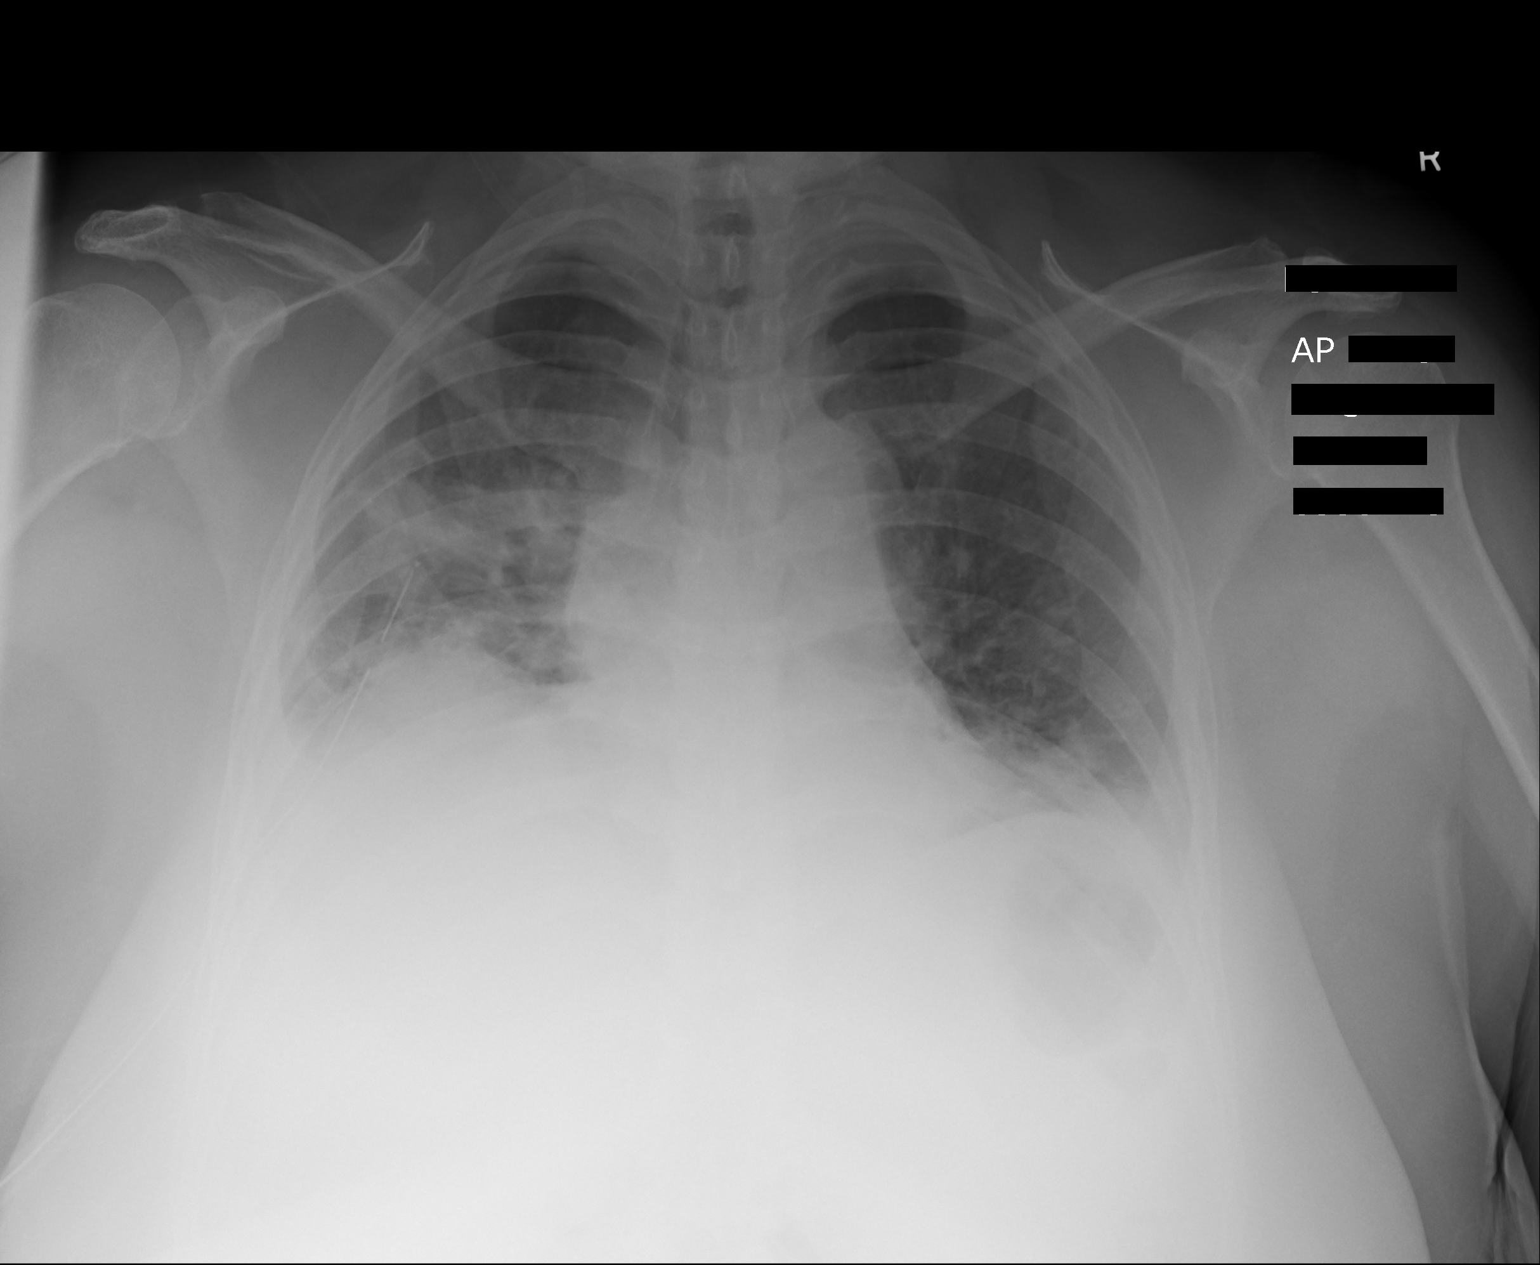

[1 of 1 positions shown; findings below may reference images not displayed]

PROCEDURE:     DXR - DXR PORTABLE CHEST SINGLE VIEW  - September 06, 2012  [DATE]

RESULT:     Comparison is made to study September 05, 2012.

The lungs remain hypoinflated. The pneumothorax on the right is not clearly
evident. The chest tube tip is at the level of the undersurface of the right
sixth rib. There are coarse lung markings at the left lung base and in the
right mid and lower lung consistent with atelectasis the The cardiac
silhouette is largely obscured. The pulmonary vascularity is not clearly
engorged. The visualized portions of the bony thorax appear normal.
IMPRESSION: There is persistent bilateral hypoinflation. Increased
interstitial markings at the left lung base and in the right mid and lower
lung likely reflect atelectasis. No residual pneumothorax is demonstrated on
the right. with no residual pneumothorax is demonstrated on the right.

Followup films with deep inspiration would be of value.

[REDACTED]

## 2014-08-25 DIAGNOSIS — R918 Other nonspecific abnormal finding of lung field: Secondary | ICD-10-CM | POA: Diagnosis not present

## 2014-08-25 DIAGNOSIS — M810 Age-related osteoporosis without current pathological fracture: Secondary | ICD-10-CM | POA: Diagnosis not present

## 2014-08-25 DIAGNOSIS — M069 Rheumatoid arthritis, unspecified: Secondary | ICD-10-CM | POA: Diagnosis not present

## 2014-08-25 DIAGNOSIS — M0609 Rheumatoid arthritis without rheumatoid factor, multiple sites: Secondary | ICD-10-CM | POA: Diagnosis not present

## 2014-08-25 DIAGNOSIS — I5032 Chronic diastolic (congestive) heart failure: Secondary | ICD-10-CM | POA: Diagnosis not present

## 2014-08-25 DIAGNOSIS — Z87891 Personal history of nicotine dependence: Secondary | ICD-10-CM | POA: Diagnosis not present

## 2014-08-25 DIAGNOSIS — I10 Essential (primary) hypertension: Secondary | ICD-10-CM | POA: Diagnosis not present

## 2014-08-25 DIAGNOSIS — M05741 Rheumatoid arthritis with rheumatoid factor of right hand without organ or systems involvement: Secondary | ICD-10-CM | POA: Diagnosis not present

## 2014-08-25 DIAGNOSIS — M170 Bilateral primary osteoarthritis of knee: Secondary | ICD-10-CM | POA: Diagnosis not present

## 2014-08-25 DIAGNOSIS — M329 Systemic lupus erythematosus, unspecified: Secondary | ICD-10-CM | POA: Diagnosis not present

## 2014-08-26 DIAGNOSIS — R911 Solitary pulmonary nodule: Secondary | ICD-10-CM | POA: Insufficient documentation

## 2014-09-03 DIAGNOSIS — Z01818 Encounter for other preprocedural examination: Secondary | ICD-10-CM | POA: Diagnosis not present

## 2014-09-03 DIAGNOSIS — M069 Rheumatoid arthritis, unspecified: Secondary | ICD-10-CM | POA: Diagnosis not present

## 2014-09-03 DIAGNOSIS — K219 Gastro-esophageal reflux disease without esophagitis: Secondary | ICD-10-CM | POA: Diagnosis not present

## 2014-09-03 DIAGNOSIS — C9290 Myeloid leukemia, unspecified, not having achieved remission: Secondary | ICD-10-CM | POA: Diagnosis not present

## 2014-09-14 DIAGNOSIS — I700 Atherosclerosis of aorta: Secondary | ICD-10-CM | POA: Diagnosis not present

## 2014-09-14 DIAGNOSIS — M329 Systemic lupus erythematosus, unspecified: Secondary | ICD-10-CM | POA: Diagnosis not present

## 2014-09-14 DIAGNOSIS — J479 Bronchiectasis, uncomplicated: Secondary | ICD-10-CM | POA: Diagnosis not present

## 2014-09-14 DIAGNOSIS — J90 Pleural effusion, not elsewhere classified: Secondary | ICD-10-CM | POA: Diagnosis not present

## 2014-09-14 DIAGNOSIS — R918 Other nonspecific abnormal finding of lung field: Secondary | ICD-10-CM | POA: Diagnosis not present

## 2014-09-15 DIAGNOSIS — C9210 Chronic myeloid leukemia, BCR/ABL-positive, not having achieved remission: Secondary | ICD-10-CM | POA: Diagnosis not present

## 2014-09-15 DIAGNOSIS — I5030 Unspecified diastolic (congestive) heart failure: Secondary | ICD-10-CM | POA: Diagnosis not present

## 2014-09-15 DIAGNOSIS — M810 Age-related osteoporosis without current pathological fracture: Secondary | ICD-10-CM | POA: Diagnosis not present

## 2014-09-15 DIAGNOSIS — E785 Hyperlipidemia, unspecified: Secondary | ICD-10-CM | POA: Diagnosis not present

## 2014-09-15 DIAGNOSIS — M069 Rheumatoid arthritis, unspecified: Secondary | ICD-10-CM | POA: Diagnosis not present

## 2014-09-15 DIAGNOSIS — Z79899 Other long term (current) drug therapy: Secondary | ICD-10-CM | POA: Diagnosis not present

## 2014-09-15 DIAGNOSIS — H2511 Age-related nuclear cataract, right eye: Secondary | ICD-10-CM | POA: Diagnosis not present

## 2014-09-15 DIAGNOSIS — K219 Gastro-esophageal reflux disease without esophagitis: Secondary | ICD-10-CM | POA: Diagnosis not present

## 2014-09-15 DIAGNOSIS — H268 Other specified cataract: Secondary | ICD-10-CM | POA: Diagnosis not present

## 2014-09-15 DIAGNOSIS — Z7982 Long term (current) use of aspirin: Secondary | ICD-10-CM | POA: Diagnosis not present

## 2014-09-15 DIAGNOSIS — I110 Hypertensive heart disease with heart failure: Secondary | ICD-10-CM | POA: Diagnosis not present

## 2014-09-15 DIAGNOSIS — Z87891 Personal history of nicotine dependence: Secondary | ICD-10-CM | POA: Diagnosis not present

## 2014-09-15 DIAGNOSIS — F329 Major depressive disorder, single episode, unspecified: Secondary | ICD-10-CM | POA: Diagnosis not present

## 2014-09-29 DIAGNOSIS — M150 Primary generalized (osteo)arthritis: Secondary | ICD-10-CM | POA: Diagnosis not present

## 2014-09-29 DIAGNOSIS — Z7982 Long term (current) use of aspirin: Secondary | ICD-10-CM | POA: Diagnosis not present

## 2014-09-29 DIAGNOSIS — Z882 Allergy status to sulfonamides status: Secondary | ICD-10-CM | POA: Diagnosis not present

## 2014-09-29 DIAGNOSIS — M069 Rheumatoid arthritis, unspecified: Secondary | ICD-10-CM | POA: Diagnosis not present

## 2014-09-29 DIAGNOSIS — C9210 Chronic myeloid leukemia, BCR/ABL-positive, not having achieved remission: Secondary | ICD-10-CM | POA: Diagnosis not present

## 2014-09-29 DIAGNOSIS — K5900 Constipation, unspecified: Secondary | ICD-10-CM | POA: Diagnosis not present

## 2014-09-29 DIAGNOSIS — I110 Hypertensive heart disease with heart failure: Secondary | ICD-10-CM | POA: Diagnosis not present

## 2014-09-29 DIAGNOSIS — I10 Essential (primary) hypertension: Secondary | ICD-10-CM | POA: Diagnosis not present

## 2014-09-29 DIAGNOSIS — E785 Hyperlipidemia, unspecified: Secondary | ICD-10-CM | POA: Diagnosis not present

## 2014-09-29 DIAGNOSIS — M810 Age-related osteoporosis without current pathological fracture: Secondary | ICD-10-CM | POA: Diagnosis not present

## 2014-09-29 DIAGNOSIS — K219 Gastro-esophageal reflux disease without esophagitis: Secondary | ICD-10-CM | POA: Diagnosis not present

## 2014-09-29 DIAGNOSIS — H25042 Posterior subcapsular polar age-related cataract, left eye: Secondary | ICD-10-CM | POA: Diagnosis not present

## 2014-09-29 DIAGNOSIS — H268 Other specified cataract: Secondary | ICD-10-CM | POA: Diagnosis not present

## 2014-09-29 DIAGNOSIS — M329 Systemic lupus erythematosus, unspecified: Secondary | ICD-10-CM | POA: Diagnosis not present

## 2014-09-29 DIAGNOSIS — I5032 Chronic diastolic (congestive) heart failure: Secondary | ICD-10-CM | POA: Diagnosis not present

## 2014-09-29 DIAGNOSIS — R601 Generalized edema: Secondary | ICD-10-CM | POA: Diagnosis not present

## 2014-09-29 DIAGNOSIS — Z87891 Personal history of nicotine dependence: Secondary | ICD-10-CM | POA: Diagnosis not present

## 2014-10-05 DIAGNOSIS — Z5111 Encounter for antineoplastic chemotherapy: Secondary | ICD-10-CM | POA: Diagnosis not present

## 2014-10-05 DIAGNOSIS — M069 Rheumatoid arthritis, unspecified: Secondary | ICD-10-CM | POA: Diagnosis not present

## 2014-10-08 DIAGNOSIS — Z79899 Other long term (current) drug therapy: Secondary | ICD-10-CM | POA: Diagnosis not present

## 2014-10-08 DIAGNOSIS — C9290 Myeloid leukemia, unspecified, not having achieved remission: Secondary | ICD-10-CM | POA: Diagnosis not present

## 2014-10-19 DIAGNOSIS — M069 Rheumatoid arthritis, unspecified: Secondary | ICD-10-CM | POA: Diagnosis not present

## 2014-11-10 DIAGNOSIS — B354 Tinea corporis: Secondary | ICD-10-CM | POA: Diagnosis not present

## 2014-11-10 DIAGNOSIS — I10 Essential (primary) hypertension: Secondary | ICD-10-CM | POA: Diagnosis not present

## 2014-11-10 DIAGNOSIS — M150 Primary generalized (osteo)arthritis: Secondary | ICD-10-CM | POA: Diagnosis not present

## 2014-11-10 DIAGNOSIS — E782 Mixed hyperlipidemia: Secondary | ICD-10-CM | POA: Diagnosis not present

## 2014-11-10 DIAGNOSIS — I7090 Unspecified atherosclerosis: Secondary | ICD-10-CM | POA: Diagnosis not present

## 2014-12-02 DIAGNOSIS — I7090 Unspecified atherosclerosis: Secondary | ICD-10-CM | POA: Diagnosis not present

## 2014-12-02 DIAGNOSIS — I2510 Atherosclerotic heart disease of native coronary artery without angina pectoris: Secondary | ICD-10-CM | POA: Diagnosis not present

## 2014-12-17 DIAGNOSIS — E559 Vitamin D deficiency, unspecified: Secondary | ICD-10-CM | POA: Diagnosis not present

## 2014-12-17 DIAGNOSIS — I10 Essential (primary) hypertension: Secondary | ICD-10-CM | POA: Diagnosis not present

## 2014-12-17 DIAGNOSIS — R7301 Impaired fasting glucose: Secondary | ICD-10-CM | POA: Diagnosis not present

## 2014-12-17 DIAGNOSIS — Z0001 Encounter for general adult medical examination with abnormal findings: Secondary | ICD-10-CM | POA: Diagnosis not present

## 2014-12-17 DIAGNOSIS — E782 Mixed hyperlipidemia: Secondary | ICD-10-CM | POA: Diagnosis not present

## 2014-12-22 DIAGNOSIS — C9210 Chronic myeloid leukemia, BCR/ABL-positive, not having achieved remission: Secondary | ICD-10-CM | POA: Diagnosis not present

## 2014-12-22 DIAGNOSIS — R7301 Impaired fasting glucose: Secondary | ICD-10-CM | POA: Diagnosis not present

## 2014-12-22 DIAGNOSIS — Z23 Encounter for immunization: Secondary | ICD-10-CM | POA: Diagnosis not present

## 2014-12-22 DIAGNOSIS — M329 Systemic lupus erythematosus, unspecified: Secondary | ICD-10-CM | POA: Diagnosis not present

## 2014-12-22 DIAGNOSIS — J019 Acute sinusitis, unspecified: Secondary | ICD-10-CM | POA: Diagnosis not present

## 2014-12-22 DIAGNOSIS — M150 Primary generalized (osteo)arthritis: Secondary | ICD-10-CM | POA: Diagnosis not present

## 2014-12-29 DIAGNOSIS — M0579 Rheumatoid arthritis with rheumatoid factor of multiple sites without organ or systems involvement: Secondary | ICD-10-CM | POA: Diagnosis not present

## 2015-01-12 ENCOUNTER — Other Ambulatory Visit: Payer: Self-pay | Admitting: *Deleted

## 2015-01-12 DIAGNOSIS — C9210 Chronic myeloid leukemia, BCR/ABL-positive, not having achieved remission: Secondary | ICD-10-CM

## 2015-01-18 ENCOUNTER — Inpatient Hospital Stay (HOSPITAL_BASED_OUTPATIENT_CLINIC_OR_DEPARTMENT_OTHER): Payer: Medicare Other | Admitting: Oncology

## 2015-01-18 ENCOUNTER — Inpatient Hospital Stay: Payer: Medicare Other | Attending: Oncology

## 2015-01-18 VITALS — BP 124/85 | HR 70 | Temp 98.2°F | Wt 222.2 lb

## 2015-01-18 DIAGNOSIS — F17210 Nicotine dependence, cigarettes, uncomplicated: Secondary | ICD-10-CM | POA: Diagnosis not present

## 2015-01-18 DIAGNOSIS — Z7982 Long term (current) use of aspirin: Secondary | ICD-10-CM | POA: Insufficient documentation

## 2015-01-18 DIAGNOSIS — E876 Hypokalemia: Secondary | ICD-10-CM | POA: Diagnosis not present

## 2015-01-18 DIAGNOSIS — M329 Systemic lupus erythematosus, unspecified: Secondary | ICD-10-CM | POA: Insufficient documentation

## 2015-01-18 DIAGNOSIS — K219 Gastro-esophageal reflux disease without esophagitis: Secondary | ICD-10-CM | POA: Diagnosis not present

## 2015-01-18 DIAGNOSIS — I10 Essential (primary) hypertension: Secondary | ICD-10-CM

## 2015-01-18 DIAGNOSIS — Z79899 Other long term (current) drug therapy: Secondary | ICD-10-CM

## 2015-01-18 DIAGNOSIS — C9210 Chronic myeloid leukemia, BCR/ABL-positive, not having achieved remission: Secondary | ICD-10-CM | POA: Insufficient documentation

## 2015-01-18 DIAGNOSIS — M069 Rheumatoid arthritis, unspecified: Secondary | ICD-10-CM | POA: Diagnosis not present

## 2015-01-18 DIAGNOSIS — J45909 Unspecified asthma, uncomplicated: Secondary | ICD-10-CM | POA: Diagnosis not present

## 2015-01-18 LAB — COMPREHENSIVE METABOLIC PANEL
ALT: 32 U/L (ref 14–54)
AST: 28 U/L (ref 15–41)
Albumin: 3.7 g/dL (ref 3.5–5.0)
Alkaline Phosphatase: 126 U/L (ref 38–126)
Anion gap: 12 (ref 5–15)
BUN: 14 mg/dL (ref 6–20)
CO2: 27 mmol/L (ref 22–32)
Calcium: 9 mg/dL (ref 8.9–10.3)
Chloride: 98 mmol/L — ABNORMAL LOW (ref 101–111)
Creatinine, Ser: 1.25 mg/dL — ABNORMAL HIGH (ref 0.44–1.00)
GFR calc Af Amer: 58 mL/min — ABNORMAL LOW (ref >60)
GFR calc non Af Amer: 50 mL/min — ABNORMAL LOW (ref >60)
Glucose, Bld: 140 mg/dL — ABNORMAL HIGH (ref 65–99)
Potassium: 2.6 mmol/L — CL (ref 3.5–5.1)
Sodium: 137 mmol/L (ref 135–145)
Total Bilirubin: 0.4 mg/dL (ref 0.3–1.2)
Total Protein: 6.9 g/dL (ref 6.5–8.1)

## 2015-01-18 LAB — PHOSPHORUS: Phosphorus: 2.9 mg/dL (ref 2.5–4.6)

## 2015-01-18 LAB — CBC WITH DIFFERENTIAL/PLATELET
Basophils Absolute: 0.3 10*3/uL — ABNORMAL HIGH (ref 0–0.1)
Basophils Relative: 3 %
Eosinophils Absolute: 0.2 10*3/uL (ref 0–0.7)
Eosinophils Relative: 2 %
HCT: 46.1 % (ref 35.0–47.0)
Hemoglobin: 14.4 g/dL (ref 12.0–16.0)
Lymphocytes Relative: 29 %
Lymphs Abs: 2.6 10*3/uL (ref 1.0–3.6)
MCH: 25.1 pg — ABNORMAL LOW (ref 26.0–34.0)
MCHC: 31.3 g/dL — ABNORMAL LOW (ref 32.0–36.0)
MCV: 80.3 fL (ref 80.0–100.0)
Monocytes Absolute: 1 10*3/uL — ABNORMAL HIGH (ref 0.2–0.9)
Monocytes Relative: 11 %
Neutro Abs: 4.7 10*3/uL (ref 1.4–6.5)
Neutrophils Relative %: 55 %
Platelets: 223 10*3/uL (ref 150–440)
RBC: 5.74 MIL/uL — ABNORMAL HIGH (ref 3.80–5.20)
RDW: 19 % — ABNORMAL HIGH (ref 11.5–14.5)
WBC: 8.8 10*3/uL (ref 3.6–11.0)

## 2015-01-18 LAB — MAGNESIUM: Magnesium: 1.9 mg/dL (ref 1.7–2.4)

## 2015-01-24 NOTE — Progress Notes (Signed)
[facility]  [phone] [phone]  ID: Laura Chen OB: [birth year]  MR#: [id number]  [id number]  Patient Care Team: Ria Bush, MD as PCP - General (Family Medicine)  CHIEF COMPLAINT:  Chief Complaint  Patient presents with  . CML    INTERVAL HISTORY: Patient returns to clinic today for repeat laboratory work and further evaluation.  She continues to tolerate Iclusig without significant side effects. She denies any fevers, night sweats, or weight loss.  She does continue to have pain from her rheumatoid arthritis, but this is stable and unchanged. Her bilateral knee pain is worse and she is considering knee replacement surgery. She denies any chest pain or shortness of breath. She has a fair appetite. She has no nausea, vomiting, constipation, or diarrhea.  She has no urinary complaints. She offers no further specific complaints today.  REVIEW OF SYSTEMS:   Review of Systems  Constitutional: Positive for malaise/fatigue.  Respiratory: Negative.   Cardiovascular: Negative.   Gastrointestinal: Negative.   Musculoskeletal: Positive for joint pain.  Neurological: Positive for weakness.    As per HPI. Otherwise, a complete review of systems is negatve.  PAST MEDICAL HISTORY: Past Medical History  Diagnosis Date  . Rheumatoid arthritis 1990s    Jacoud's arthropathy, previously treated with MTX, TNFa (remicade, enbrel, humira, orencia, rituximab, and actemra)  . Hypertension   . CML (chronic myeloid leukemia) 2011    (Dr. Alyse Low) stopped gleevec 2/2 side effects  . History of shingles   . Osteopenia     due to prednisone use, was on reclast  . SLE (systemic lupus erythematosus) 09/2009    Rheum-Dr. Joan Mayans  . Asthma   . GERD (gastroesophageal reflux disease)   . PUD (peptic ulcer disease)   . Thyroid goiter     Endo-Dr. Eddie Dibbles  . History of syphilis 1990    s/p treatment    PAST SURGICAL HISTORY: Past Surgical  History  Procedure Laterality Date  . Us/hsg  05/2010    possible endometrial polyp, rec rpt 8 wks continue loestrin 24 [facility])  . Myomectomy  2008  . Dexa  2010    osteopenia  . Right ankle afo  2011  . Transthoracic echocardiogram  123456    nl systolic/diastolic fxn, EF [id number], mild MR, normal PA pressures    FAMILY HISTORY Family History  Problem Relation Age of Onset  . Hypertension Mother   . Hypertension      Aunt  . Heart attack Maternal Grandmother   . Coronary artery disease Maternal Grandmother        ADVANCED DIRECTIVES:    HEALTH MAINTENANCE: Social History  Substance Use Topics  . Smoking status: Current Every Day Smoker -- 0.30 packs/day    Types: Cigarettes  . Smokeless tobacco: Not on file     Comment: 6 cigarettes/day  . Alcohol Use: No     Colonoscopy:  PAP:  Bone density:  Lipid panel:  Allergies  Allergen Reactions  . Ibuprofen Swelling  . Sulfonamide Derivatives     REACTION: Burns to feet  . Tape Other (See Comments) and Itching    skin reaction r/t tegaderm and tape  . Sulfa Antibiotics Rash    Other reaction(s): RASH  . Thiazide-Type Diuretics Rash    Other reaction(s): RASH    Current Outpatient Prescriptions  Medication Sig Dispense Refill  . albuterol (VENTOLIN HFA) 108 (90 BASE) MCG/ACT inhaler Inhale 2 puffs into the lungs every 4 (four) hours as needed.      Marland Kitchen  AMETHYST 90-20 MCG tablet TK UTD  5  . aspirin 81 MG tablet Take 81 mg by mouth.    . Calcium Carbonate-Vit D-Min (CALCIUM 1200) 1200-1000 MG-UNIT CHEW Chew 1 tablet by mouth daily.      . Certolizumab Pegol (CIMZIA [place]) Inject 400 mg into the skin every 30 (thirty) days.      . clotrimazole (LOTRIMIN) 1 % cream APP EXT AA BID FOR 2 WKS FOR RASH THEN PRN  0  . clotrimazole-betamethasone (LOTRISONE) cream Apply topically 2 (two) times daily. For 2 wks. 30 g 0  . DULoxetine (CYMBALTA) 20 MG capsule     . DULoxetine (CYMBALTA) 20 MG capsule TK 1 C PO D FOR DEPRESSION  3   . fluticasone (FLONASE) 50 MCG/ACT nasal spray 2 sprays daily.    . hydrocortisone cream 1 % APPLY TO ALL AFFECTED AREAS 3 TO 4 TIMES D PRN FOR RASH/ITCHING  0  . ICLUSIG 15 MG tablet     . lactulose, encephalopathy, (GENERLAC) 10 GM/15ML SOLN Take 10 g by mouth at bedtime.      Marland Kitchen LINZESS 290 MCG CAPS capsule TK 1 C PO QD  3  . metolazone (ZAROXOLYN) 10 MG tablet TK 1 T PO QOD ALTERNATING WITH 2 TS QOD  3  . mupirocin ointment (BACTROBAN) 2 % Apply topically.    Marland Kitchen omeprazole (PRILOSEC) 20 MG capsule Take 20 mg by mouth.    . oxyCODONE (OXY IR/ROXICODONE) 5 MG immediate release tablet Take 5 mg by mouth every 4 (four) hours as needed.      . pantoprazole (PROTONIX) 40 MG tablet Take 40 mg by mouth daily.      . potassium chloride (K-DUR) 10 MEQ tablet TK 2 TS PO BID  3  . predniSONE (DELTASONE) 5 MG tablet Take 5 mg by mouth daily with breakfast.    . prochlorperazine (COMPAZINE) 10 MG tablet Take 10 mg by mouth every 6 (six) hours as needed.      . rosuvastatin (CRESTOR) 5 MG tablet TK 1 T PO QD WITH SUPPER  3  . traMADol (ULTRAM) 50 MG tablet TK 2 TS PO TID PRF PAIN  3  . VOLTAREN 1 % GEL Apply 1 application topically Twice daily as needed.    Marland Kitchen aMILoride (MIDAMOR) 5 MG tablet TAKE 1 TABLET BY MOUTH TWICE DAILY (Patient not taking: Reported on 01/18/2015) 180 tablet 1  . fexofenadine-pseudoephedrine (ALLEGRA-D) 60-120 MG per tablet Take 1 tablet by mouth daily.      . meloxicam (MOBIC) 15 MG tablet Take 1 tablet by mouth Daily.    . predniSONE (DELTASONE) 10 MG tablet Take 12.5 mg by mouth daily.     Marland Kitchen PRESCRIPTION MEDICATION 1 tablet daily. Spry-Cell 100 mg      No current facility-administered medications for this visit.    OBJECTIVE: Filed Vitals:   01/18/15 1459  BP: 124/85  Pulse: 70  Temp: 98.2 F (36.8 C)     Body mass index is 36.98 kg/(m^2).    ECOG FS:1 - Symptomatic but completely ambulatory  General: Well-developed, well-nourished, no acute distress. Eyes: Pink  conjunctiva, anicteric sclera. Lungs: Clear to auscultation bilaterally. Heart: Regular rate and rhythm. No rubs, murmurs, or gallops. Abdomen: Soft, nontender, nondistended. No organomegaly noted, normoactive bowel sounds. Musculoskeletal: No edema, cyanosis, or clubbing. Neuro: Alert, answering all questions appropriately. Cranial nerves grossly intact. Skin: No rashes or petechiae noted. Psych: Normal affect.   LAB RESULTS:  Lab Results  Component Value Date  NA 137 01/18/2015   K 2.6* 01/18/2015   CL 98* 01/18/2015   CO2 27 01/18/2015   GLUCOSE 140* 01/18/2015   BUN 14 01/18/2015   CREATININE 1.25* 01/18/2015   CALCIUM 9.0 01/18/2015   PROT 6.9 01/18/2015   ALBUMIN 3.7 01/18/2015   AST 28 01/18/2015   ALT 32 01/18/2015   ALKPHOS 126 01/18/2015   BILITOT 0.4 01/18/2015   GFRNONAA 50* 01/18/2015   GFRAA 58* 01/18/2015    Lab Results  Component Value Date   WBC 8.8 01/18/2015   NEUTROABS 4.7 01/18/2015   HGB 14.4 01/18/2015   HCT 46.1 01/18/2015   MCV 80.3 01/18/2015   PLT 223 01/18/2015     STUDIES: No results found.  ASSESSMENT: CML  PLAN:    1.  CML:  Previously, patient could not tolerate Gleevec, Sprycel, Tasigna, and Bosulif.  She is able to tolerate Ponatinib (Iclusig), which she will continue with this daily or until progression of disease.  She has acknowledged the blackbox warning and risks of this medication.  Recent BCR-ABL transcript recorded at [facility] was improved. Today's result is pending. Return to clinic in 6 months with repeat laboratory work and further evaluation. This scheduling will allow her to alternate visits with Dr. Neldon Labella from [facility] and [facility] every 3 months.  2.  Rheumatoid arthritis: Rituxan every 6 months per [facility], Dr. Stann Mainland.   3.  Hypertension: Blood pressure is within normal limits today, continue current treatment regimen. 4.  Cavitary lung lesions: Rheumatologic nodules, treatment as above. 5.  Hypokalemia:  Continue oral potassium supplementation. Patient does not wish potassium infusion today. 6. Knee replacement: Okay to proceed with bilateral knee placement from a hematologic standpoint. Patient does not require interruption in her treatment for CML.   Patient expressed understanding and was in agreement with this plan. She also understands that She can call clinic at any time with any questions, concerns, or complaints.     Lloyd Huger, MD   01/24/2015 8:19 AM

## 2015-02-25 ENCOUNTER — Ambulatory Visit
Admission: RE | Admit: 2015-02-25 | Discharge: 2015-02-25 | Disposition: A | Discharge status: Home / Self Care | Payer: Medicare Other | Source: Ambulatory Visit | Attending: Internal Medicine | Admitting: Internal Medicine

## 2015-02-25 ENCOUNTER — Other Ambulatory Visit: Payer: Self-pay | Admitting: Internal Medicine

## 2015-02-25 DIAGNOSIS — R918 Other nonspecific abnormal finding of lung field: Secondary | ICD-10-CM | POA: Insufficient documentation

## 2015-02-25 DIAGNOSIS — R05 Cough: Secondary | ICD-10-CM | POA: Diagnosis not present

## 2015-02-25 DIAGNOSIS — Z87891 Personal history of nicotine dependence: Secondary | ICD-10-CM | POA: Diagnosis not present

## 2015-02-25 DIAGNOSIS — R0989 Other specified symptoms and signs involving the circulatory and respiratory systems: Secondary | ICD-10-CM

## 2015-02-25 DIAGNOSIS — R059 Cough, unspecified: Secondary | ICD-10-CM

## 2015-02-25 DIAGNOSIS — R2681 Unsteadiness on feet: Secondary | ICD-10-CM | POA: Diagnosis not present

## 2015-02-25 DIAGNOSIS — M0549 Rheumatoid myopathy with rheumatoid arthritis of multiple sites: Secondary | ICD-10-CM | POA: Diagnosis not present

## 2015-02-25 DIAGNOSIS — J9690 Respiratory failure, unspecified, unspecified whether with hypoxia or hypercapnia: Secondary | ICD-10-CM | POA: Diagnosis not present

## 2015-02-25 DIAGNOSIS — J45909 Unspecified asthma, uncomplicated: Secondary | ICD-10-CM | POA: Insufficient documentation

## 2015-02-25 DIAGNOSIS — J029 Acute pharyngitis, unspecified: Secondary | ICD-10-CM | POA: Diagnosis not present

## 2015-02-25 DIAGNOSIS — J45991 Cough variant asthma: Secondary | ICD-10-CM | POA: Diagnosis not present

## 2015-03-01 DIAGNOSIS — M0579 Rheumatoid arthritis with rheumatoid factor of multiple sites without organ or systems involvement: Secondary | ICD-10-CM | POA: Diagnosis not present

## 2015-03-01 DIAGNOSIS — M069 Rheumatoid arthritis, unspecified: Secondary | ICD-10-CM | POA: Diagnosis not present

## 2015-03-01 DIAGNOSIS — Z23 Encounter for immunization: Secondary | ICD-10-CM | POA: Diagnosis not present

## 2015-03-01 DIAGNOSIS — M329 Systemic lupus erythematosus, unspecified: Secondary | ICD-10-CM | POA: Diagnosis not present

## 2015-03-01 DIAGNOSIS — M05742 Rheumatoid arthritis with rheumatoid factor of left hand without organ or systems involvement: Secondary | ICD-10-CM | POA: Diagnosis not present

## 2015-03-01 DIAGNOSIS — M05741 Rheumatoid arthritis with rheumatoid factor of right hand without organ or systems involvement: Secondary | ICD-10-CM | POA: Diagnosis not present

## 2015-03-10 DIAGNOSIS — M069 Rheumatoid arthritis, unspecified: Secondary | ICD-10-CM | POA: Diagnosis not present

## 2015-03-10 DIAGNOSIS — Z7952 Long term (current) use of systemic steroids: Secondary | ICD-10-CM | POA: Diagnosis not present

## 2015-03-10 DIAGNOSIS — M351 Other overlap syndromes: Secondary | ICD-10-CM | POA: Diagnosis not present

## 2015-03-10 DIAGNOSIS — C9210 Chronic myeloid leukemia, BCR/ABL-positive, not having achieved remission: Secondary | ICD-10-CM | POA: Diagnosis not present

## 2015-03-10 DIAGNOSIS — M329 Systemic lupus erythematosus, unspecified: Secondary | ICD-10-CM | POA: Diagnosis not present

## 2015-03-10 DIAGNOSIS — R0602 Shortness of breath: Secondary | ICD-10-CM | POA: Diagnosis not present

## 2015-03-10 DIAGNOSIS — M25442 Effusion, left hand: Secondary | ICD-10-CM | POA: Diagnosis not present

## 2015-03-10 DIAGNOSIS — M25441 Effusion, right hand: Secondary | ICD-10-CM | POA: Diagnosis not present

## 2015-03-10 DIAGNOSIS — M2548 Effusion, other site: Secondary | ICD-10-CM | POA: Diagnosis not present

## 2015-03-10 DIAGNOSIS — M25561 Pain in right knee: Secondary | ICD-10-CM | POA: Diagnosis not present

## 2015-03-10 DIAGNOSIS — M25562 Pain in left knee: Secondary | ICD-10-CM | POA: Diagnosis not present

## 2015-03-10 DIAGNOSIS — Z79899 Other long term (current) drug therapy: Secondary | ICD-10-CM | POA: Diagnosis not present

## 2015-03-10 DIAGNOSIS — R140 Abdominal distension (gaseous): Secondary | ICD-10-CM | POA: Diagnosis not present

## 2015-03-18 DIAGNOSIS — M069 Rheumatoid arthritis, unspecified: Secondary | ICD-10-CM | POA: Diagnosis not present

## 2015-03-24 DIAGNOSIS — M150 Primary generalized (osteo)arthritis: Secondary | ICD-10-CM | POA: Diagnosis not present

## 2015-03-24 DIAGNOSIS — K5900 Constipation, unspecified: Secondary | ICD-10-CM | POA: Diagnosis not present

## 2015-03-24 DIAGNOSIS — M329 Systemic lupus erythematosus, unspecified: Secondary | ICD-10-CM | POA: Diagnosis not present

## 2015-03-24 DIAGNOSIS — R7301 Impaired fasting glucose: Secondary | ICD-10-CM | POA: Diagnosis not present

## 2015-03-24 DIAGNOSIS — C9210 Chronic myeloid leukemia, BCR/ABL-positive, not having achieved remission: Secondary | ICD-10-CM | POA: Diagnosis not present

## 2015-03-24 DIAGNOSIS — I10 Essential (primary) hypertension: Secondary | ICD-10-CM | POA: Diagnosis not present

## 2015-04-19 ENCOUNTER — Emergency Department: Payer: Medicare Other

## 2015-04-19 ENCOUNTER — Encounter: Payer: Self-pay | Admitting: Emergency Medicine

## 2015-04-19 ENCOUNTER — Emergency Department
Admission: EM | Admit: 2015-04-19 | Discharge: 2015-04-19 | Disposition: A | Discharge status: Home / Self Care | Payer: Medicare Other | Attending: Emergency Medicine | Admitting: Emergency Medicine

## 2015-04-19 DIAGNOSIS — R059 Cough, unspecified: Secondary | ICD-10-CM

## 2015-04-19 DIAGNOSIS — R05 Cough: Secondary | ICD-10-CM

## 2015-04-19 DIAGNOSIS — Z87891 Personal history of nicotine dependence: Secondary | ICD-10-CM | POA: Insufficient documentation

## 2015-04-19 DIAGNOSIS — I10 Essential (primary) hypertension: Secondary | ICD-10-CM | POA: Diagnosis not present

## 2015-04-19 DIAGNOSIS — J45909 Unspecified asthma, uncomplicated: Secondary | ICD-10-CM | POA: Diagnosis not present

## 2015-04-19 DIAGNOSIS — R509 Fever, unspecified: Secondary | ICD-10-CM | POA: Diagnosis not present

## 2015-04-19 HISTORY — DX: Leukemia, unspecified not having achieved remission: C95.90

## 2015-04-19 MED ORDER — HYDROCOD POLST-CPM POLST ER 10-8 MG/5ML PO SUER: 5.0000 mL | Freq: Two times a day (BID) | ORAL | Status: DC

## 2015-04-19 MED ORDER — HYDROCOD POLST-CPM POLST ER 10-8 MG/5ML PO SUER
5.0000 mL | Freq: Once | ORAL | Status: AC
  Administered 2015-04-19: 5 mL via ORAL
  Filled 2015-04-19: qty 5

## 2015-04-19 MED ORDER — AZITHROMYCIN 250 MG PO TABS: ORAL_TABLET | ORAL | Status: DC

## 2015-04-19 NOTE — ED Notes (Signed)
States has cough since yesterday, currently getting oral chemo for leukemia.

## 2015-04-19 NOTE — Discharge Instructions (Signed)

## 2015-04-19 NOTE — ED Provider Notes (Signed)
[facility] Emergency Department Provider Note     Time seen: ----------------------------------------- 3:39 PM on 04/19/2015 -----------------------------------------    I have reviewed the triage vital signs and the nursing notes.   HISTORY  Chief Complaint Cough    HPI Laura Chen is a 50 y.o. female who presents the ER for cough since chest today. She denies fevers, chills, chest pain, vomiting or diarrhea. Patient states she is on oral chemotherapy for leukemia and she has rheumatoid arthritis.   Past Medical History  Diagnosis Date  . Rheumatoid arthritis(714.0) 1990s    Jacoud's arthropathy, previously treated with MTX, TNFa (remicade, enbrel, humira, orencia, rituximab, and actemra)  . Hypertension   . CML (chronic myeloid leukemia) ([place]) 2011    (Dr. Alyse Low) stopped gleevec 2/2 side effects  . History of shingles   . Osteopenia     due to prednisone use, was on reclast  . SLE (systemic lupus erythematosus) ([place]) 09/2009    Rheum-Dr. Joan Mayans  . Asthma   . GERD (gastroesophageal reflux disease)   . PUD (peptic ulcer disease)   . Thyroid goiter     Endo-Dr. Eddie Dibbles  . History of syphilis 1990    s/p treatment  . Leukemia [facility])     Patient Active Problem List   Diagnosis Date Noted  . Polymenorrhea 10/28/2010  . Anemia 10/28/2010  . External otitis 10/27/2010  . Endometrial polyp 10/27/2010  . Menorrhagia 10/27/2010  . Right flank pain 10/12/2010  . Fatigue 06/23/2010  . Skin rash 06/07/2010  . Edema 06/07/2010  . Tobacco abuse 06/07/2010  . Chronic myeloid leukemia ([place]) 03/24/2010  . ASTHMA 03/24/2010  . GERD 03/24/2010  . SYSTEMIC LUPUS ERYTHEMATOSUS 03/24/2010  . ARTHRITIS, RHEUMATOID 03/24/2010  . FLATULENCE ERUCTATION AND GAS PAIN 03/24/2010    Past Surgical History  Procedure Laterality Date  . Us/hsg  05/2010    possible endometrial polyp, rec rpt 8 wks continue loestrin 24 [facility])  . Myomectomy   2008  . Dexa  2010    osteopenia  . Right ankle afo  2011  . Transthoracic echocardiogram  123456    nl systolic/diastolic fxn, EF XX123456, mild MR, normal PA pressures    Allergies Ibuprofen; Sulfonamide derivatives; Tape; Sulfa antibiotics; and Thiazide-type diuretics  Social History Social History  Substance Use Topics  . Smoking status: Former Smoker -- 0.30 packs/day    Types: Cigarettes  . Smokeless tobacco: None     Comment: 6 cigarettes/day  . Alcohol Use: No    Review of Systems Constitutional: Negative for fever. Eyes: Negative for visual changes. ENT: Negative for sore throat. Cardiovascular: Negative for chest pain. Respiratory: Positive for cough Gastrointestinal: Negative for abdominal pain, vomiting and diarrhea. Genitourinary: Negative for dysuria. Musculoskeletal: Negative for back pain. Skin: Negative for rash. Neurological: Negative for headaches, focal weakness or numbness.  10-point ROS otherwise negative.  ____________________________________________   PHYSICAL EXAM:  VITAL SIGNS: ED Triage Vitals  Enc Vitals Group     BP 04/19/15 1441 134/101 mmHg     Pulse Rate 04/19/15 1441 112     Resp 04/19/15 1441 20     Temp 04/19/15 1441 98.7 F (37.1 C)     Temp Source 04/19/15 1441 Oral     SpO2 04/19/15 1441 100 %     Weight 04/19/15 1441 221 lb (100.245 kg)     Height 04/19/15 1441 5\' 5"  (1.651 m)     Head Cir --      Peak Flow --  Pain Score 04/19/15 1442 4     Pain Loc --      Pain Edu? --      Excl. in [place]? --     Constitutional: Alert and oriented. Well appearing and in no distress. Eyes: Conjunctivae are normal. PERRL. Normal extraocular movements. ENT   Head: Normocephalic and atraumatic.   Nose: No congestion/rhinnorhea.   Mouth/Throat: Mucous membranes are moist.   Neck: No stridor. Cardiovascular: Normal rate, regular rhythm. Normal and symmetric distal pulses are present in all extremities. No murmurs, rubs, or  gallops. Respiratory: Normal respiratory effort without tachypnea nor retractions. Breath sounds are clear and equal bilaterally. No wheezes/rales/rhonchi. Gastrointestinal: Soft and nontender. No distention. No abdominal bruits.  Musculoskeletal: Nontender with normal range of motion in all extremities. No joint effusions.  No lower extremity tenderness nor edema. Neurologic:  Normal speech and language. No gross focal neurologic deficits are appreciated. Speech is normal. No gait instability. Skin:  Skin is warm, dry and intact. No rash noted. Psychiatric: Mood and affect are normal. Speech and behavior are normal. Patient exhibits appropriate insight and judgment. ____________________________________________  ED COURSE:  Pertinent labs & imaging results that were available during my care of the patient were reviewed by me and considered in my medical decision making (see chart for details). Patient is in no acute distress, does not appear dyspneic. We'll obtain chest x-ray and reevaluate. ____________________________________________   RADIOLOGY Images were viewed by me  Chest x-ray is unremarkable  ____________________________________________  FINAL ASSESSMENT AND PLAN  Cough  Plan: Patient with imaging as dictated above. Patient is in no acute distress, she does states it hurts when she coughs. She does not have any shortness of breath or pleuritic pain to suggest PE. Because she is prone to infection and will place her on a Z-Pak and give her Tussionex for cough. She stable for outpatient follow-up with her doctor.   Earleen Newport, MD   Earleen Newport, MD 04/19/15 [phone]

## 2015-04-23 DIAGNOSIS — Z888 Allergy status to other drugs, medicaments and biological substances status: Secondary | ICD-10-CM | POA: Diagnosis not present

## 2015-04-23 DIAGNOSIS — M069 Rheumatoid arthritis, unspecified: Secondary | ICD-10-CM | POA: Diagnosis not present

## 2015-04-23 DIAGNOSIS — M810 Age-related osteoporosis without current pathological fracture: Secondary | ICD-10-CM | POA: Diagnosis not present

## 2015-04-23 DIAGNOSIS — C9210 Chronic myeloid leukemia, BCR/ABL-positive, not having achieved remission: Secondary | ICD-10-CM | POA: Diagnosis not present

## 2015-04-23 DIAGNOSIS — M329 Systemic lupus erythematosus, unspecified: Secondary | ICD-10-CM | POA: Diagnosis not present

## 2015-04-23 DIAGNOSIS — I110 Hypertensive heart disease with heart failure: Secondary | ICD-10-CM | POA: Diagnosis not present

## 2015-04-23 DIAGNOSIS — K219 Gastro-esophageal reflux disease without esophagitis: Secondary | ICD-10-CM | POA: Diagnosis not present

## 2015-04-23 DIAGNOSIS — E876 Hypokalemia: Secondary | ICD-10-CM | POA: Diagnosis not present

## 2015-04-23 DIAGNOSIS — N179 Acute kidney failure, unspecified: Secondary | ICD-10-CM | POA: Diagnosis not present

## 2015-04-23 DIAGNOSIS — G8929 Other chronic pain: Secondary | ICD-10-CM | POA: Diagnosis not present

## 2015-04-23 DIAGNOSIS — E871 Hypo-osmolality and hyponatremia: Secondary | ICD-10-CM | POA: Diagnosis not present

## 2015-04-23 DIAGNOSIS — Z7951 Long term (current) use of inhaled steroids: Secondary | ICD-10-CM | POA: Diagnosis not present

## 2015-04-23 DIAGNOSIS — Z7982 Long term (current) use of aspirin: Secondary | ICD-10-CM | POA: Diagnosis not present

## 2015-04-23 DIAGNOSIS — Z79899 Other long term (current) drug therapy: Secondary | ICD-10-CM | POA: Diagnosis not present

## 2015-04-23 DIAGNOSIS — Z79891 Long term (current) use of opiate analgesic: Secondary | ICD-10-CM | POA: Diagnosis not present

## 2015-04-23 DIAGNOSIS — E785 Hyperlipidemia, unspecified: Secondary | ICD-10-CM | POA: Diagnosis not present

## 2015-04-23 DIAGNOSIS — B370 Candidal stomatitis: Secondary | ICD-10-CM | POA: Diagnosis not present

## 2015-04-23 DIAGNOSIS — J101 Influenza due to other identified influenza virus with other respiratory manifestations: Secondary | ICD-10-CM | POA: Diagnosis not present

## 2015-04-23 DIAGNOSIS — R7989 Other specified abnormal findings of blood chemistry: Secondary | ICD-10-CM | POA: Diagnosis not present

## 2015-04-23 DIAGNOSIS — Z7952 Long term (current) use of systemic steroids: Secondary | ICD-10-CM | POA: Diagnosis not present

## 2015-04-23 DIAGNOSIS — J45909 Unspecified asthma, uncomplicated: Secondary | ICD-10-CM | POA: Diagnosis not present

## 2015-04-23 DIAGNOSIS — Z882 Allergy status to sulfonamides status: Secondary | ICD-10-CM | POA: Diagnosis not present

## 2015-04-23 DIAGNOSIS — Z87891 Personal history of nicotine dependence: Secondary | ICD-10-CM | POA: Diagnosis not present

## 2015-04-23 DIAGNOSIS — I5030 Unspecified diastolic (congestive) heart failure: Secondary | ICD-10-CM | POA: Diagnosis not present

## 2015-04-24 DIAGNOSIS — R7989 Other specified abnormal findings of blood chemistry: Secondary | ICD-10-CM | POA: Insufficient documentation

## 2015-04-24 DIAGNOSIS — E871 Hypo-osmolality and hyponatremia: Secondary | ICD-10-CM | POA: Diagnosis not present

## 2015-04-24 DIAGNOSIS — J101 Influenza due to other identified influenza virus with other respiratory manifestations: Secondary | ICD-10-CM | POA: Insufficient documentation

## 2015-04-24 DIAGNOSIS — B370 Candidal stomatitis: Secondary | ICD-10-CM | POA: Insufficient documentation

## 2015-04-24 DIAGNOSIS — N179 Acute kidney failure, unspecified: Secondary | ICD-10-CM | POA: Diagnosis not present

## 2015-04-24 DIAGNOSIS — J9811 Atelectasis: Secondary | ICD-10-CM | POA: Diagnosis not present

## 2015-04-24 DIAGNOSIS — R918 Other nonspecific abnormal finding of lung field: Secondary | ICD-10-CM | POA: Diagnosis not present

## 2015-04-24 DIAGNOSIS — B379 Candidiasis, unspecified: Secondary | ICD-10-CM | POA: Diagnosis not present

## 2015-04-24 DIAGNOSIS — R05 Cough: Secondary | ICD-10-CM | POA: Diagnosis not present

## 2015-04-24 DIAGNOSIS — R945 Abnormal results of liver function studies: Secondary | ICD-10-CM

## 2015-04-25 DIAGNOSIS — B379 Candidiasis, unspecified: Secondary | ICD-10-CM | POA: Diagnosis not present

## 2015-04-25 DIAGNOSIS — J101 Influenza due to other identified influenza virus with other respiratory manifestations: Secondary | ICD-10-CM | POA: Diagnosis not present

## 2015-04-25 DIAGNOSIS — N179 Acute kidney failure, unspecified: Secondary | ICD-10-CM | POA: Diagnosis not present

## 2015-04-25 DIAGNOSIS — E871 Hypo-osmolality and hyponatremia: Secondary | ICD-10-CM | POA: Diagnosis not present

## 2015-04-30 ENCOUNTER — Encounter: Payer: Self-pay | Admitting: Emergency Medicine

## 2015-04-30 ENCOUNTER — Inpatient Hospital Stay
Admission: EM | Admit: 2015-04-30 | Discharge: 2015-05-02 | DRG: 194 | Disposition: A | Discharge status: Home / Self Care | Payer: Medicare Other | Attending: Internal Medicine | Admitting: Internal Medicine

## 2015-04-30 ENCOUNTER — Emergency Department: Payer: Medicare Other

## 2015-04-30 DIAGNOSIS — I10 Essential (primary) hypertension: Secondary | ICD-10-CM | POA: Diagnosis present

## 2015-04-30 DIAGNOSIS — M069 Rheumatoid arthritis, unspecified: Secondary | ICD-10-CM | POA: Diagnosis present

## 2015-04-30 DIAGNOSIS — Z7982 Long term (current) use of aspirin: Secondary | ICD-10-CM

## 2015-04-30 DIAGNOSIS — Z8249 Family history of ischemic heart disease and other diseases of the circulatory system: Secondary | ICD-10-CM

## 2015-04-30 DIAGNOSIS — R1111 Vomiting without nausea: Secondary | ICD-10-CM | POA: Diagnosis not present

## 2015-04-30 DIAGNOSIS — Z791 Long term (current) use of non-steroidal anti-inflammatories (NSAID): Secondary | ICD-10-CM | POA: Diagnosis not present

## 2015-04-30 DIAGNOSIS — Z886 Allergy status to analgesic agent status: Secondary | ICD-10-CM | POA: Diagnosis not present

## 2015-04-30 DIAGNOSIS — Z91048 Other nonmedicinal substance allergy status: Secondary | ICD-10-CM | POA: Diagnosis not present

## 2015-04-30 DIAGNOSIS — Z8711 Personal history of peptic ulcer disease: Secondary | ICD-10-CM | POA: Diagnosis not present

## 2015-04-30 DIAGNOSIS — M329 Systemic lupus erythematosus, unspecified: Secondary | ICD-10-CM | POA: Diagnosis present

## 2015-04-30 DIAGNOSIS — R05 Cough: Secondary | ICD-10-CM | POA: Diagnosis not present

## 2015-04-30 DIAGNOSIS — C9590 Leukemia, unspecified not having achieved remission: Secondary | ICD-10-CM | POA: Diagnosis present

## 2015-04-30 DIAGNOSIS — Z882 Allergy status to sulfonamides status: Secondary | ICD-10-CM | POA: Diagnosis not present

## 2015-04-30 DIAGNOSIS — E876 Hypokalemia: Secondary | ICD-10-CM | POA: Diagnosis not present

## 2015-04-30 DIAGNOSIS — Z87891 Personal history of nicotine dependence: Secondary | ICD-10-CM | POA: Diagnosis not present

## 2015-04-30 DIAGNOSIS — K219 Gastro-esophageal reflux disease without esophagitis: Secondary | ICD-10-CM | POA: Diagnosis not present

## 2015-04-30 DIAGNOSIS — J189 Pneumonia, unspecified organism: Secondary | ICD-10-CM | POA: Diagnosis not present

## 2015-04-30 DIAGNOSIS — J45909 Unspecified asthma, uncomplicated: Secondary | ICD-10-CM | POA: Diagnosis present

## 2015-04-30 DIAGNOSIS — Z8619 Personal history of other infectious and parasitic diseases: Secondary | ICD-10-CM

## 2015-04-30 DIAGNOSIS — Z7952 Long term (current) use of systemic steroids: Secondary | ICD-10-CM | POA: Diagnosis not present

## 2015-04-30 DIAGNOSIS — Z888 Allergy status to other drugs, medicaments and biological substances status: Secondary | ICD-10-CM | POA: Diagnosis not present

## 2015-04-30 DIAGNOSIS — Z79899 Other long term (current) drug therapy: Secondary | ICD-10-CM

## 2015-04-30 DIAGNOSIS — M8580 Other specified disorders of bone density and structure, unspecified site: Secondary | ICD-10-CM | POA: Diagnosis not present

## 2015-04-30 DIAGNOSIS — R000 Tachycardia, unspecified: Secondary | ICD-10-CM | POA: Diagnosis present

## 2015-04-30 DIAGNOSIS — R112 Nausea with vomiting, unspecified: Secondary | ICD-10-CM | POA: Diagnosis not present

## 2015-04-30 LAB — COMPREHENSIVE METABOLIC PANEL
ALT: 46 U/L (ref 14–54)
AST: 44 U/L — ABNORMAL HIGH (ref 15–41)
Albumin: 3.6 g/dL (ref 3.5–5.0)
Alkaline Phosphatase: 172 U/L — ABNORMAL HIGH (ref 38–126)
Anion gap: 15 (ref 5–15)
BUN: 7 mg/dL (ref 6–20)
CO2: 23 mmol/L (ref 22–32)
Calcium: 9.1 mg/dL (ref 8.9–10.3)
Chloride: 101 mmol/L (ref 101–111)
Creatinine, Ser: 1.01 mg/dL — ABNORMAL HIGH (ref 0.44–1.00)
GFR calc Af Amer: >60 mL/min (ref >60)
GFR calc non Af Amer: >60 mL/min (ref >60)
Glucose, Bld: 104 mg/dL — ABNORMAL HIGH (ref 65–99)
Potassium: 2.7 mmol/L — CL (ref 3.5–5.1)
Sodium: 139 mmol/L (ref 135–145)
Total Bilirubin: 0.8 mg/dL (ref 0.3–1.2)
Total Protein: 6.9 g/dL (ref 6.5–8.1)

## 2015-04-30 LAB — URINALYSIS COMPLETE WITH MICROSCOPIC (ARMC ONLY)
Bilirubin Urine: NEGATIVE
Glucose, UA: NEGATIVE mg/dL
Hgb urine dipstick: NEGATIVE
Leukocytes, UA: NEGATIVE
Nitrite: NEGATIVE
Protein, ur: NEGATIVE mg/dL
Specific Gravity, Urine: 1.006 (ref 1.005–1.030)
pH: 7 (ref 5.0–8.0)

## 2015-04-30 LAB — CBC
HCT: 43.1 % (ref 35.0–47.0)
Hemoglobin: 13.5 g/dL (ref 12.0–16.0)
MCH: 24.4 pg — ABNORMAL LOW (ref 26.0–34.0)
MCHC: 31.4 g/dL — ABNORMAL LOW (ref 32.0–36.0)
MCV: 77.8 fL — ABNORMAL LOW (ref 80.0–100.0)
Platelets: 333 10*3/uL (ref 150–440)
RBC: 5.54 MIL/uL — ABNORMAL HIGH (ref 3.80–5.20)
RDW: 19.9 % — ABNORMAL HIGH (ref 11.5–14.5)
WBC: 7.5 10*3/uL (ref 3.6–11.0)

## 2015-04-30 LAB — LIPASE, BLOOD: Lipase: 34 U/L (ref 11–51)

## 2015-04-30 LAB — INFLUENZA PANEL BY PCR (TYPE A & B)
H1N1 flu by pcr: NOT DETECTED
Influenza A By PCR: NEGATIVE
Influenza B By PCR: NEGATIVE

## 2015-04-30 LAB — TROPONIN I: Troponin I: <0.03 ng/mL (ref <0.031)

## 2015-04-30 MED ORDER — BISACODYL 5 MG PO TBEC: 5.0000 mg | DELAYED_RELEASE_TABLET | Freq: Every day | ORAL | Status: DC | PRN

## 2015-04-30 MED ORDER — LEVOFLOXACIN 750 MG PO TABS
750.0000 mg | ORAL_TABLET | Freq: Every day | ORAL | Status: DC
  Administered 2015-05-01: 750 mg via ORAL
  Filled 2015-04-30: qty 1

## 2015-04-30 MED ORDER — SODIUM CHLORIDE 0.9 % IV BOLUS (SEPSIS)
1000.0000 mL | Freq: Once | INTRAVENOUS | Status: AC
  Administered 2015-04-30: 1000 mL via INTRAVENOUS

## 2015-04-30 MED ORDER — ONDANSETRON HCL 4 MG PO TABS: 4.0000 mg | ORAL_TABLET | Freq: Three times a day (TID) | ORAL | Status: DC | PRN

## 2015-04-30 MED ORDER — LEVOFLOXACIN 750 MG PO TABS: 750.0000 mg | ORAL_TABLET | Freq: Every day | ORAL | Status: DC

## 2015-04-30 MED ORDER — MEDROXYPROGESTERONE ACETATE 10 MG PO TABS
10.0000 mg | ORAL_TABLET | Freq: Every evening | ORAL | Status: DC
  Administered 2015-04-30 – 2015-05-01 (×2): 10 mg via ORAL
  Filled 2015-04-30 (×3): qty 1

## 2015-04-30 MED ORDER — IPRATROPIUM-ALBUTEROL 0.5-2.5 (3) MG/3ML IN SOLN
3.0000 mL | Freq: Once | RESPIRATORY_TRACT | Status: AC
  Administered 2015-04-30: 3 mL via RESPIRATORY_TRACT
  Filled 2015-04-30: qty 3

## 2015-04-30 MED ORDER — POTASSIUM CHLORIDE 10 MEQ/100ML IV SOLN
10.0000 meq | Freq: Once | INTRAVENOUS | Status: DC
  Filled 2015-04-30: qty 100

## 2015-04-30 MED ORDER — FLUTICASONE PROPIONATE 50 MCG/ACT NA SUSP
2.0000 | Freq: Every day | NASAL | Status: DC
  Administered 2015-05-01 – 2015-05-02 (×2): 2 via NASAL
  Filled 2015-04-30: qty 16

## 2015-04-30 MED ORDER — ONDANSETRON 4 MG PO TBDP
4.0000 mg | ORAL_TABLET | Freq: Two times a day (BID) | ORAL | Status: DC | PRN
  Filled 2015-04-30: qty 1

## 2015-04-30 MED ORDER — POTASSIUM CHLORIDE CRYS ER 20 MEQ PO TBCR
40.0000 meq | EXTENDED_RELEASE_TABLET | ORAL | Status: AC
  Administered 2015-04-30: 40 meq via ORAL
  Filled 2015-04-30: qty 2

## 2015-04-30 MED ORDER — PANTOPRAZOLE SODIUM 40 MG PO TBEC
40.0000 mg | DELAYED_RELEASE_TABLET | Freq: Every day | ORAL | Status: DC
  Administered 2015-04-30 – 2015-05-02 (×3): 40 mg via ORAL
  Filled 2015-04-30 (×3): qty 1

## 2015-04-30 MED ORDER — POTASSIUM CHLORIDE CRYS ER 20 MEQ PO TBCR
40.0000 meq | EXTENDED_RELEASE_TABLET | Freq: Once | ORAL | Status: AC
  Administered 2015-04-30: 40 meq via ORAL
  Filled 2015-04-30: qty 2

## 2015-04-30 MED ORDER — PREDNISONE 5 MG PO TABS
5.0000 mg | ORAL_TABLET | Freq: Every day | ORAL | Status: DC
  Administered 2015-05-01 – 2015-05-02 (×2): 5 mg via ORAL
  Filled 2015-04-30 (×3): qty 1

## 2015-04-30 MED ORDER — AMILORIDE HCL 5 MG PO TABS
5.0000 mg | ORAL_TABLET | Freq: Two times a day (BID) | ORAL | Status: DC
  Filled 2015-04-30 (×5): qty 1

## 2015-04-30 MED ORDER — LORATADINE 10 MG PO TABS
10.0000 mg | ORAL_TABLET | Freq: Every day | ORAL | Status: DC
  Administered 2015-04-30 – 2015-05-02 (×3): 10 mg via ORAL
  Filled 2015-04-30 (×3): qty 1

## 2015-04-30 MED ORDER — ACETAMINOPHEN 325 MG PO TABS: 650.0000 mg | ORAL_TABLET | Freq: Four times a day (QID) | ORAL | Status: DC | PRN

## 2015-04-30 MED ORDER — PONATINIB HCL 15 MG PO TABS
15.0000 mg | ORAL_TABLET | Freq: Every day | ORAL | Status: DC
  Administered 2015-05-01: 15 mg via ORAL
  Filled 2015-04-30: qty 1

## 2015-04-30 MED ORDER — ASPIRIN EC 81 MG PO TBEC
81.0000 mg | DELAYED_RELEASE_TABLET | Freq: Every evening | ORAL | Status: DC
  Administered 2015-04-30 – 2015-05-01 (×2): 81 mg via ORAL
  Filled 2015-04-30 (×2): qty 1

## 2015-04-30 MED ORDER — HYDROCODONE-ACETAMINOPHEN 5-325 MG PO TABS: 1.0000 | ORAL_TABLET | ORAL | Status: DC | PRN

## 2015-04-30 MED ORDER — IPRATROPIUM-ALBUTEROL 0.5-2.5 (3) MG/3ML IN SOLN
3.0000 mL | Freq: Once | RESPIRATORY_TRACT | Status: AC
  Administered 2015-04-30: 3 mL via RESPIRATORY_TRACT
  Filled 2015-04-30: qty 6

## 2015-04-30 MED ORDER — ONDANSETRON HCL 4 MG/2ML IJ SOLN
4.0000 mg | Freq: Once | INTRAMUSCULAR | Status: AC | PRN
  Administered 2015-04-30: 4 mg via INTRAVENOUS
  Filled 2015-04-30: qty 2

## 2015-04-30 MED ORDER — DULOXETINE HCL 20 MG PO CPEP
20.0000 mg | ORAL_CAPSULE | Freq: Every day | ORAL | Status: DC
  Administered 2015-04-30 – 2015-05-02 (×3): 20 mg via ORAL
  Filled 2015-04-30 (×3): qty 1

## 2015-04-30 MED ORDER — OXYCODONE HCL 5 MG PO TABS: 10.0000 mg | ORAL_TABLET | Freq: Three times a day (TID) | ORAL | Status: DC | PRN

## 2015-04-30 MED ORDER — ALUM & MAG HYDROXIDE-SIMETH 200-200-20 MG/5ML PO SUSP: 30.0000 mL | Freq: Four times a day (QID) | ORAL | Status: DC | PRN

## 2015-04-30 MED ORDER — ONDANSETRON 4 MG PO TBDP
4.0000 mg | ORAL_TABLET | Freq: Once | ORAL | Status: AC
  Administered 2015-04-30: 4 mg via ORAL
  Filled 2015-04-30: qty 1

## 2015-04-30 MED ORDER — TRAZODONE HCL 50 MG PO TABS: 25.0000 mg | ORAL_TABLET | Freq: Every evening | ORAL | Status: DC | PRN

## 2015-04-30 MED ORDER — ONDANSETRON HCL 4 MG/2ML IJ SOLN: 4.0000 mg | Freq: Four times a day (QID) | INTRAMUSCULAR | Status: DC | PRN

## 2015-04-30 MED ORDER — TRAMADOL HCL 50 MG PO TABS
100.0000 mg | ORAL_TABLET | Freq: Two times a day (BID) | ORAL | Status: DC
Start: 2015-04-30 — End: 2015-05-02
  Administered 2015-04-30 – 2015-05-02 (×4): 100 mg via ORAL
  Filled 2015-04-30 (×5): qty 2

## 2015-04-30 MED ORDER — ROSUVASTATIN CALCIUM 10 MG PO TABS
5.0000 mg | ORAL_TABLET | Freq: Every day | ORAL | Status: DC
  Administered 2015-05-01: 5 mg via ORAL
  Filled 2015-04-30: qty 1

## 2015-04-30 MED ORDER — ACETAMINOPHEN 650 MG RE SUPP: 650.0000 mg | Freq: Four times a day (QID) | RECTAL | Status: DC | PRN

## 2015-04-30 MED ORDER — SODIUM CHLORIDE 0.9 % IV SOLN: INTRAVENOUS | Status: DC

## 2015-04-30 MED ORDER — HYDROCOD POLST-CPM POLST ER 10-8 MG/5ML PO SUER
5.0000 mL | Freq: Two times a day (BID) | ORAL | Status: DC
Start: 2015-04-30 — End: 2015-05-02
  Administered 2015-04-30 – 2015-05-02 (×4): 5 mL via ORAL
  Filled 2015-04-30 (×4): qty 5

## 2015-04-30 MED ORDER — LINACLOTIDE 290 MCG PO CAPS
290.0000 ug | ORAL_CAPSULE | Freq: Every day | ORAL | Status: DC
  Administered 2015-04-30 – 2015-05-02 (×3): 290 ug via ORAL
  Filled 2015-04-30 (×3): qty 1

## 2015-04-30 MED ORDER — DOCUSATE SODIUM 100 MG PO CAPS
100.0000 mg | ORAL_CAPSULE | Freq: Two times a day (BID) | ORAL | Status: DC
  Administered 2015-05-01 – 2015-05-02 (×3): 100 mg via ORAL
  Filled 2015-04-30 (×3): qty 1

## 2015-04-30 MED ORDER — LEVOFLOXACIN 750 MG PO TABS
750.0000 mg | ORAL_TABLET | Freq: Once | ORAL | Status: AC
  Administered 2015-04-30: 750 mg via ORAL
  Filled 2015-04-30: qty 1

## 2015-04-30 MED ORDER — ONDANSETRON 8 MG PO TBDP
8.0000 mg | ORAL_TABLET | Freq: Once | ORAL | Status: DC
  Filled 2015-04-30: qty 1

## 2015-04-30 MED ORDER — ONDANSETRON HCL 4 MG PO TABS: 4.0000 mg | ORAL_TABLET | Freq: Four times a day (QID) | ORAL | Status: DC | PRN

## 2015-04-30 MED ORDER — POTASSIUM CHLORIDE 10 MEQ/100ML IV SOLN
10.0000 meq | Freq: Once | INTRAVENOUS | Status: AC
  Administered 2015-04-30: 10 meq via INTRAVENOUS
  Filled 2015-04-30: qty 100

## 2015-04-30 MED ORDER — ENOXAPARIN SODIUM 40 MG/0.4ML ~~LOC~~ SOLN
40.0000 mg | SUBCUTANEOUS | Status: DC
  Administered 2015-04-30 – 2015-05-01 (×2): 40 mg via SUBCUTANEOUS
  Filled 2015-04-30 (×2): qty 0.4

## 2015-04-30 MED ORDER — SODIUM CHLORIDE 0.9 % IV SOLN
INTRAVENOUS | Status: DC
  Administered 2015-04-30 – 2015-05-02 (×3): via INTRAVENOUS

## 2015-04-30 MED ORDER — VITAMIN D (ERGOCALCIFEROL) 1.25 MG (50000 UNIT) PO CAPS: 50000.0000 [IU] | ORAL_CAPSULE | ORAL | Status: DC

## 2015-04-30 NOTE — H&P (Signed)
[place] [facility] [place] NAME: Laura Chen    MR#:  [id number]  DATE OF BIRTH:  1965-05-22  DATE OF ADMISSION:  04/30/2015  PRIMARY CARE PHYSICIAN: Lavera Guise, MD   REQUESTING/REFERRING PHYSICIAN: Dr. Lisa Roca  CHIEF COMPLAINT: cough   Chief Complaint  Patient presents with  . Emesis    HISTORY OF PRESENT ILLNESS:  Laura Chen  is a 50 y.o. female with a known history of  recent diagnosis of flu came in because of nausea, vomiting unable to keep anything down. Patient continues to have cough. Patient was at [facility]  Two days ago and was diagnosed with flu and was given Tamiflu. Patient comes in here today with the nausea, vomiting persistently feeling weak, cough and low-grade temperature. Chest x-ray showed right-sided pneumonia. Patient the not hypoxic but that she is persistently tachycardic with heart rate up to 1 14 bpm. Show persistent hypokalemia. She has history of CML, lupus, rheumatoid arthritis.  PAST MEDICAL HISTORY:   Past Medical History  Diagnosis Date  . Rheumatoid arthritis(714.0) 1990s    Jacoud's arthropathy, previously treated with MTX, TNFa (remicade, enbrel, humira, orencia, rituximab, and actemra)  . Hypertension   . CML (chronic myeloid leukemia) ([place]) 2011    (Dr. Alyse Low) stopped gleevec 2/2 side effects  . History of shingles   . Osteopenia     due to prednisone use, was on reclast  . SLE (systemic lupus erythematosus) ([place]) 09/2009    Rheum-Dr. Joan Mayans  . Asthma   . GERD (gastroesophageal reflux disease)   . PUD (peptic ulcer disease)   . Thyroid goiter     Endo-Dr. Eddie Dibbles  . History of syphilis 1990    s/p treatment  . Leukemia ([place])     PAST SURGICAL HISTOIRY:   Past Surgical History  Procedure Laterality Date  . Us/hsg  05/2010    possible endometrial polyp, rec rpt 8 wks continue loestrin 24 [facility])  . Myomectomy  2008  . Dexa  2010    osteopenia  . Right  ankle afo  2011  . Transthoracic echocardiogram  123456    nl systolic/diastolic fxn, EF [id number], mild MR, normal PA pressures    SOCIAL HISTORY:   Social History  Substance Use Topics  . Smoking status: Former Smoker -- 0.30 packs/day    Types: Cigarettes  . Smokeless tobacco: Not on file     Comment: 6 cigarettes/day  . Alcohol Use: No    FAMILY HISTORY:   Family History  Problem Relation Age of Onset  . Hypertension Mother   . Hypertension      Aunt  . Heart attack Maternal Grandmother   . Coronary artery disease Maternal Grandmother     DRUG ALLERGIES:   Allergies  Allergen Reactions  . Ibuprofen Swelling  . Sulfa Antibiotics Rash and Other (See Comments)    Reaction:  Burning of feet   . Tape Itching and Rash  . Thiazide-Type Diuretics Rash    REVIEW OF SYSTEMS:  CONSTITUTIONAL: low Grade temperature, fatigue.S: No blurred or double vision.  EARS, NOSE, AND THROAT: No tinnitus or ear pain.  RESPIRATORY:Has some cough., shortness of breath, wheezing or hemoptysis.  CARDIOVASCULAR: No chest pain, orthopnea, edema.  GASTROINTESTINAL: No nausea, vomiting, diarrhea or abdominal pain.  GENITOURINARY: No dysuria, hematuria.  ENDOCRINE: No polyuria, nocturia,  HEMATOLOGY: No anemia, easy bruising or bleeding SKIN: No rash or lesion. MUSCULOSKELETAL: No joint pain or arthritis.  NEUROLOGIC: No tingling, numbness, weakness.  PSYCHIATRY: No anxiety or depression.   MEDICATIONS AT HOME:   Prior to Admission medications   Medication Sig Start Date End Date Taking? Authorizing Provider  aspirin EC 81 MG tablet Take 81 mg by mouth every evening.    Yes Historical Provider, MD  DULoxetine (CYMBALTA) 20 MG capsule Take 20 mg by mouth daily.    Yes Historical Provider, MD  ICLUSIG 15 MG tablet Take 15 mg by mouth at bedtime.    Yes Historical Provider, MD  omeprazole (PRILOSEC) 20 MG capsule Take 20 mg by mouth daily.    Yes Historical Provider, MD  potassium chloride  (K-DUR,KLOR-CON) 10 MEQ tablet Take 20 mEq by mouth 2 (two) times daily.   Yes Historical Provider, MD  predniSONE (DELTASONE) 5 MG tablet Take 5 mg by mouth daily with breakfast.   Yes Historical Provider, MD  rosuvastatin (CRESTOR) 5 MG tablet Take 5 mg by mouth daily with supper.   Yes Historical Provider, MD  traMADol (ULTRAM) 50 MG tablet Take 100 mg by mouth 2 (two) times daily.   Yes Historical Provider, MD  albuterol (VENTOLIN HFA) 108 (90 BASE) MCG/ACT inhaler Inhale 2 puffs into the lungs every 4 (four) hours as needed.      Historical Provider, MD  aMILoride (MIDAMOR) 5 MG tablet TAKE 1 TABLET BY MOUTH TWICE DAILY Patient not taking: Reported on 01/18/2015 07/24/11   Ria Bush, MD  Calcium Carbonate-Vit D-Min (CALCIUM 1200) 1200-1000 MG-UNIT CHEW Chew 1 tablet by mouth daily.      Historical Provider, MD  Certolizumab Pegol (CIMZIA [place]) Inject 400 mg into the skin every 30 (thirty) days.      Historical Provider, MD  chlorpheniramine-HYDROcodone (TUSSIONEX PENNKINETIC ER) 10-8 MG/5ML SUER Take 5 mLs by mouth 2 (two) times daily. Patient not taking: Reported on 04/30/2015 04/19/15   Earleen Newport, MD  fexofenadine-pseudoephedrine (ALLEGRA-D) 60-120 MG per tablet Take 1 tablet by mouth daily.      Historical Provider, MD  fluticasone (FLONASE) 50 MCG/ACT nasal spray 2 sprays daily. 10/29/13   Historical Provider, MD  hydrocortisone cream 1 % APPLY TO ALL AFFECTED AREAS 3 TO 4 TIMES D PRN FOR RASH/ITCHING 12/22/14   Historical Provider, MD  lactulose, encephalopathy, (GENERLAC) 10 GM/15ML SOLN Take 10 g by mouth at bedtime.      Historical Provider, MD  levofloxacin (LEVAQUIN) 750 MG tablet Take 1 tablet (750 mg total) by mouth daily. 04/30/15   Lisa Roca, MD  levonorgestrel-ethinyl estradiol (LYBREL,AMETHYST) 90-20 MCG tablet Take 1 tablet by mouth daily.    Historical Provider, MD  LINZESS 290 MCG CAPS capsule TK 1 C PO QD 01/01/15   Historical Provider, MD  meloxicam (MOBIC) 15 MG  tablet Take 1 tablet by mouth Daily. 05/10/10   Historical Provider, MD  mupirocin ointment (BACTROBAN) 2 % Apply topically. 03/18/14   Historical Provider, MD  ondansetron (ZOFRAN) 4 MG tablet Take 1 tablet (4 mg total) by mouth every 8 (eight) hours as needed for nausea or vomiting. 04/30/15   Lisa Roca, MD  oxyCODONE (OXY IR/ROXICODONE) 5 MG immediate release tablet Take 5 mg by mouth every 4 (four) hours as needed.      Historical Provider, MD  pantoprazole (PROTONIX) 40 MG tablet Take 40 mg by mouth daily.      Historical Provider, MD  prochlorperazine (COMPAZINE) 10 MG tablet Take 10 mg by mouth every 6 (six) hours as needed.      Historical Provider, MD  VOLTAREN 1 % GEL Apply 1 application topically Twice daily as needed. 03/28/10   Historical Provider, MD      VITAL SIGNS:  Blood pressure 129/46, pulse 97, temperature 98.3 F (36.8 C), temperature source Oral, resp. rate 16, height 5\' 5"  (1.651 m), weight 100.699 kg (222 lb), SpO2 100 %.  PHYSICAL EXAMINATION:  GENERAL:  51 y.o.-year-old patient lying in the bed with no acute distress.  EYES: Pupils equal, round, reactive to light and accommodation. No scleral icterus. Extraocular muscles intact.  HEENT: Head atraumatic, normocephalic. Oropharynx and nasopharynx clear.  NECK:  Supple, no jugular venous distention. No thyroid enlargement, no tenderness.  LUNGS: Normal breath sounds bilaterally, no wheezing, rales,rhonchi or crepitation. No use of accessory muscles of respiration.  CARDIOVASCULAR: S1, S2 normal. No murmurs, rubs, or gallops.  ABDOMEN: Soft, nontender, nondistended. Bowel sounds present. No organomegaly or mass.  EXTREMITIES: No pedal edema, cyanosis, or clubbing.  NEUROLOGIC: Cranial nerves II through XII are intact. Muscle strength 5/5 in all extremities. Sensation intact. Gait not checked.  PSYCHIATRIC: The patient is alert and oriented x 3.  SKIN: No obvious rash, lesion, or ulcer.   LABORATORY PANEL:    CBC  Recent Labs Lab 04/30/15 1322  WBC 7.5  HGB 13.5  HCT 43.1  PLT 333   ------------------------------------------------------------------------------------------------------------------  Chemistries   Recent Labs Lab 04/30/15 1322  NA 139  K 2.7*  CL 101  CO2 23  GLUCOSE 104*  BUN 7  CREATININE 1.01*  CALCIUM 9.1  AST 44*  ALT 46  ALKPHOS 172*  BILITOT 0.8   ------------------------------------------------------------------------------------------------------------------  Cardiac Enzymes  Recent Labs Lab 04/30/15 1322  TROPONINI <0.03   ------------------------------------------------------------------------------------------------------------------  RADIOLOGY:  Dg Chest 2 View  04/30/2015  CLINICAL DATA:  Cough and congestion. Pneumonia and bronchitis. Leukemia. EXAM: CHEST - 2 VIEW COMPARISON:  Two-view chest x-ray a 04/19/2015. FINDINGS: The heart size is normal. Chronic elevation of the right hemidiaphragm is stable. Mild density projected posteriorly is concerning for early right lower lobe infection. The left lung is clear. The visualized soft tissues and bony thorax are unremarkable. IMPRESSION: 1. New right lower lobe airspace disease concerning for pneumonia. 2. Stable elevation of the right hemidiaphragm. Electronically Signed   By: San Morelle M.D.   On: 04/30/2015 15:17    EKG:   Orders placed or performed during the hospital encounter of 04/30/15  . EKG 12-Lead  . EKG 12-Lead   sinus tachycardia at a rate 1 10 bpm no ST-T changes.  IMPRESSION AND PLAN:   #1 nausea and vomiting, cough likely secondary to right-sided pneumonia: Continue Levaquin, IV hydration, IV nausea medicine. #2 hypokalemia replace the potassium. She  has chronic hypokalemia with potassium baseline is 2.6. Oral potassium supplements. #3 recent diagnosis of flu: Patient may have post influenza fatigue at this time. 4..tachyardiac likely secondary to cough and  pneumonia. Monitor  On off unit telemetry continue hydration and, continue cough medicine with  Tussionex. . #5 CML: Follows up Dr. Grayland Ormond. #6 rheumatoid arthritis: follows up with the [facility] rheumatology Hypertension: Continue current medicine #7 cavitary lung lesion: rheumatological  lung nodules. Medical  records are reviewed and case discussed with ED provider. Management plans discussed with the patient, family and they are in agreement.  CODE STATUS: full  TOTAL TIME TAKING CARE OF THIS PATIENT: 55 minutes.    Epifanio Lesches M.D on 04/30/2015 at 7:54 PM  Between 7am to 6pm - Pager - [phone]  After 6pm go to [email or web address] - password [email or web address]  [facility]  Office  [phone]  CC: Primary care physician; Lavera Guise, MD  Note: This dictation was prepared with Dragon dictation along with smaller phrase technology. Any transcriptional errors that result from this process are unintentional.

## 2015-04-30 NOTE — Discharge Instructions (Signed)
You were evaluated for vomiting and treated with nausea medication called Zofran. You're being discharged with a prescription for Zofran. Your chest x-ray showed a right-sided pneumonia you're being placed on antibiotic, Levaquin.  Return to the emergency department for any worsening condition including trouble breathing, fever, concern for dehydration, or any other symptoms concerning to you.     Nausea and Vomiting Nausea means you feel sick to your stomach. Throwing up (vomiting) is a reflex where stomach contents come out of your mouth. HOME CARE   Take medicine as told by your doctor.  Do not force yourself to eat. However, you do need to drink fluids.  If you feel like eating, eat a normal diet as told by your doctor.  Eat rice, wheat, potatoes, bread, lean meats, yogurt, fruits, and vegetables.  Avoid high-fat foods.  Drink enough fluids to keep your pee (urine) clear or pale yellow.  Ask your doctor how to replace body fluid losses (rehydrate). Signs of body fluid loss (dehydration) include:  Feeling very thirsty.  Dry lips and mouth.  Feeling dizzy.  Dark pee.  Peeing less than normal.  Feeling confused.  Fast breathing or heart rate. GET HELP RIGHT AWAY IF:   You have blood in your throw up.  You have black or bloody poop (stool).  You have a bad headache or stiff neck.  You feel confused.  You have bad belly (abdominal) pain.  You have chest pain or trouble breathing.  You do not pee at least once every 8 hours.  You have cold, clammy skin.  You keep throwing up after 24 to 48 hours.  You have a fever. MAKE SURE YOU:   Understand these instructions.  Will watch your condition.  Will get help right away if you are not doing well or get worse.   This information is not intended to replace advice given to you by your health care provider. Make sure you discuss any questions you have with your health care provider.   Document Released:  07/26/2007 Document Revised: 05/01/2011 Document Reviewed: 07/08/2010 Elsevier Interactive Patient Education 2016 [place] Pneumonia, Adult Pneumonia is an infection of the lungs. One type of pneumonia can happen while a person is in a hospital. A different type can happen when a person is not in a hospital (community-acquired pneumonia). It is easy for this kind to spread from person to person. It can spread to you if you breathe near an infected person who coughs or sneezes. Some symptoms include:  A dry cough.  A wet (productive) cough.  Fever.  Sweating.  Chest pain. HOME CARE  Take over-the-counter and prescription medicines only as told by your doctor.  Only take cough medicine if you are losing sleep.  If you were prescribed an antibiotic medicine, take it as told by your doctor. Do not stop taking the antibiotic even if you start to feel better.  Sleep with your head and neck raised (elevated). You can do this by putting a few pillows under your head, or you can sleep in a recliner.  Do not use tobacco products. These include cigarettes, chewing tobacco, and e-cigarettes. If you need help quitting, ask your doctor.  Drink enough water to keep your pee (urine) clear or pale yellow. A shot (vaccine) can help prevent pneumonia. Shots are often suggested for:  People older than 48 years of age.  People older than 48 years of age:  Who are having cancer treatment.  Who have long-term (  chronic) lung disease.  Who have problems with their body's defense system (immune system). You may also prevent pneumonia if you take these actions:  Get the flu (influenza) shot every year.  Go to the dentist as often as told.  Wash your hands often. If soap and water are not available, use hand sanitizer. GET HELP IF:  You have a fever.  You lose sleep because your cough medicine does not help. GET HELP RIGHT AWAY IF:  You are short of breath and it  gets worse.  You have more chest pain.  Your sickness gets worse. This is very serious if:  You are an older adult.  Your body's defense system is weak.  You cough up blood.   This information is not intended to replace advice given to you by your health care provider. Make sure you discuss any questions you have with your health care provider.   Document Released: 07/26/2007 Document Revised: 10/28/2014 Document Reviewed: 06/03/2014 Elsevier Interactive Patient Education [employer].

## 2015-04-30 NOTE — ED Notes (Signed)
Seen in ED a few days ago and diagnosed with flu.  Patient was given medicine for flu, but could not keep anything down.  Patient then went to [facility] and was admitted for 2 days.  Patient states that she is still vomiting and unable to tolerate PO intake.

## 2015-04-30 NOTE — ED Notes (Signed)
Patient finished ED sandwich tray. No nausea or vomiting noted.

## 2015-04-30 NOTE — ED Provider Notes (Cosign Needed)
[facility] Emergency Department Provider Note   ____________________________________________  Time seen:  I have reviewed the triage vital signs and the triage nursing note.  HISTORY  Chief Complaint Emesis   Historian Patient  HPI Laura Chen is a 50 y.o. female with a history of CML, lupus, rheumatoid arthritis, is here in the EDfor vomiting and not tolerating by mouth for about 2 days. She states that she actually has been feeling bad all over for about a week. She was preceded diagnosed with upper respiratory infection and completed a course of azithromycin. She later was admitted to [facility] for 2 days for what sounds like influenza per the patient.  She's been at home since Sunday and has just had essentially muscle aches and body aches. No fever today. She reports that she is has had a bit of cough but nonproductive.  The main reason she came in is vomiting. No specific abdominal pain.  Nothing makes it worse or better. Symptoms are moderate.    Past Medical History  Diagnosis Date  . Rheumatoid arthritis(714.0) 1990s    Jacoud's arthropathy, previously treated with MTX, TNFa (remicade, enbrel, humira, orencia, rituximab, and actemra)  . Hypertension   . CML (chronic myeloid leukemia) ([place]) 2011    (Dr. Alyse Low) stopped gleevec 2/2 side effects  . History of shingles   . Osteopenia     due to prednisone use, was on reclast  . SLE (systemic lupus erythematosus) ([place]) 09/2009    Rheum-Dr. Joan Mayans  . Asthma   . GERD (gastroesophageal reflux disease)   . PUD (peptic ulcer disease)   . Thyroid goiter     Endo-Dr. Eddie Dibbles  . History of syphilis 1990    s/p treatment  . Leukemia [facility])     Patient Active Problem List   Diagnosis Date Noted  . Polymenorrhea 10/28/2010  . Anemia 10/28/2010  . External otitis 10/27/2010  . Endometrial polyp 10/27/2010  . Menorrhagia 10/27/2010  . Right flank pain 10/12/2010  . Fatigue  06/23/2010  . Skin rash 06/07/2010  . Edema 06/07/2010  . Tobacco abuse 06/07/2010  . Chronic myeloid leukemia ([place]) 03/24/2010  . ASTHMA 03/24/2010  . GERD 03/24/2010  . SYSTEMIC LUPUS ERYTHEMATOSUS 03/24/2010  . ARTHRITIS, RHEUMATOID 03/24/2010  . FLATULENCE ERUCTATION AND GAS PAIN 03/24/2010    Past Surgical History  Procedure Laterality Date  . Us/hsg  05/2010    possible endometrial polyp, rec rpt 8 wks continue loestrin 24 [facility])  . Myomectomy  2008  . Dexa  2010    osteopenia  . Right ankle afo  2011  . Transthoracic echocardiogram  123456    nl systolic/diastolic fxn, EF [id number], mild MR, normal PA pressures    Current Outpatient Rx  Name  Route  Sig  Dispense  Refill  . aspirin EC 81 MG tablet   Oral   Take 81 mg by mouth daily.         . ICLUSIG 15 MG tablet   Oral   Take 15 mg by mouth at bedtime.            Dispense as written.   Marland Kitchen levonorgestrel-ethinyl estradiol (LYBREL,AMETHYST) 90-20 MCG tablet   Oral   Take 1 tablet by mouth daily.         . rosuvastatin (CRESTOR) 5 MG tablet   Oral   Take 5 mg by mouth daily with supper.         . traMADol (ULTRAM) 50 MG tablet  Oral   Take 100 mg by mouth 2 (two) times daily.         Marland Kitchen albuterol (VENTOLIN HFA) 108 (90 BASE) MCG/ACT inhaler   Inhalation   Inhale 2 puffs into the lungs every 4 (four) hours as needed.           Marland Kitchen aMILoride (MIDAMOR) 5 MG tablet      TAKE 1 TABLET BY MOUTH TWICE DAILY Patient not taking: Reported on 01/18/2015   180 tablet   1   . Calcium Carbonate-Vit D-Min (CALCIUM 1200) 1200-1000 MG-UNIT CHEW   Oral   Chew 1 tablet by mouth daily.           . Certolizumab Pegol (CIMZIA [place])   Subcutaneous   Inject 400 mg into the skin every 30 (thirty) days.           . chlorpheniramine-HYDROcodone (TUSSIONEX PENNKINETIC ER) 10-8 MG/5ML SUER   Oral   Take 5 mLs by mouth 2 (two) times daily. Patient not taking: Reported on 04/30/2015   140 mL   0   . DULoxetine  (CYMBALTA) 20 MG capsule               . fexofenadine-pseudoephedrine (ALLEGRA-D) 60-120 MG per tablet   Oral   Take 1 tablet by mouth daily.           . fluticasone (FLONASE) 50 MCG/ACT nasal spray      2 sprays daily.         . hydrocortisone cream 1 %      APPLY TO ALL AFFECTED AREAS 3 TO 4 TIMES D PRN FOR RASH/ITCHING      0   . lactulose, encephalopathy, (GENERLAC) 10 GM/15ML SOLN   Oral   Take 10 g by mouth at bedtime.           Marland Kitchen levofloxacin (LEVAQUIN) 750 MG tablet   Oral   Take 1 tablet (750 mg total) by mouth daily.   4 tablet   0   . LINZESS 290 MCG CAPS capsule      TK 1 C PO QD      3     Dispense as written.   . meloxicam (MOBIC) 15 MG tablet   Oral   Take 1 tablet by mouth Daily.         . metolazone (ZAROXOLYN) 10 MG tablet      TK 1 T PO QOD ALTERNATING WITH 2 TS QOD      3   . mupirocin ointment (BACTROBAN) 2 %   Topical   Apply topically.         Marland Kitchen omeprazole (PRILOSEC) 20 MG capsule   Oral   Take 20 mg by mouth.         . ondansetron (ZOFRAN) 4 MG tablet   Oral   Take 1 tablet (4 mg total) by mouth every 8 (eight) hours as needed for nausea or vomiting.   10 tablet   0   . oxyCODONE (OXY IR/ROXICODONE) 5 MG immediate release tablet   Oral   Take 5 mg by mouth every 4 (four) hours as needed.           . pantoprazole (PROTONIX) 40 MG tablet   Oral   Take 40 mg by mouth daily.           . predniSONE (DELTASONE) 10 MG tablet   Oral   Take 12.5 mg by mouth daily.          Marland Kitchen  predniSONE (DELTASONE) 5 MG tablet   Oral   Take 5 mg by mouth daily with breakfast.         . PRESCRIPTION MEDICATION      1 tablet daily. Spry-Cell 100 mg          . prochlorperazine (COMPAZINE) 10 MG tablet   Oral   Take 10 mg by mouth every 6 (six) hours as needed.           . VOLTAREN 1 % GEL   Topical   Apply 1 application topically Twice daily as needed.           Allergies Ibuprofen; Sulfa antibiotics;  Tape; and Thiazide-type diuretics  Family History  Problem Relation Age of Onset  . Hypertension Mother   . Hypertension      Aunt  . Heart attack Maternal Grandmother   . Coronary artery disease Maternal Grandmother     Social History Social History  Substance Use Topics  . Smoking status: Former Smoker -- 0.30 packs/day    Types: Cigarettes  . Smokeless tobacco: None     Comment: 6 cigarettes/day  . Alcohol Use: No    Review of Systems  Constitutional: Negative for fever. Eyes: Negative for visual changes. ENT: Negative for sore throat. Cardiovascular: Negative for chest pain. Respiratory: Positive for mild cough.. Gastrointestinal: Negative for diarrhea Genitourinary: Negative for dysuria. Musculoskeletal: Negative for back pain. Skin: Negative for rash. Neurological: Negative for headache. 10 point Review of Systems otherwise negative ____________________________________________   PHYSICAL EXAM:  VITAL SIGNS: ED Triage Vitals  Enc Vitals Group     BP 04/30/15 1318 111/40 mmHg     Pulse Rate 04/30/15 1318 118     Resp 04/30/15 1318 22     Temp 04/30/15 1318 98.3 F (36.8 C)     Temp Source 04/30/15 1318 Oral     SpO2 04/30/15 1318 97 %     Weight 04/30/15 1318 222 lb (100.699 kg)     Height 04/30/15 1318 5\' 5"  (1.651 m)     Head Cir --      Peak Flow --      Pain Score 04/30/15 1320 0     Pain Loc --      Pain Edu? --      Excl. in [place]? --      Constitutional: Alert and oriented. Well appearing and in no distress. HEENT   Head: Normocephalic and atraumatic.      Eyes: Conjunctivae are normal. PERRL. Normal extraocular movements.      Ears:         Nose: No congestion/rhinnorhea.   Mouth/Throat: Mucous membranes are mildly dry.   Neck: No stridor. Cardiovascular/Chest: Normal rate, regular rhythm.  No murmurs, rubs, or gallops. Respiratory: Normal respiratory effort without tachypnea nor retractions. Breath sounds are clear and equal  bilaterally. No wheezes/rales/rhonchi. Gastrointestinal: Soft. No distention, no guarding, no rebound. Nontender.    Genitourinary/rectal:Deferred Musculoskeletal: Nontender with normal range of motion in all extremities. No joint effusions.  No lower extremity tenderness.  No edema. Neurologic:  Normal speech and language. No gross or focal neurologic deficits are appreciated. Skin:  Skin is warm, dry and intact. No rash noted. Psychiatric: Mood and affect are normal. Speech and behavior are normal. Patient exhibits appropriate insight and judgment.  ____________________________________________   EKG I, Lisa Roca, MD, the attending physician have personally viewed and interpreted all ECGs.  Sinus tachycardia. Narrow QRS. Normal axis. Nonspecific ST and T-wave ____________________________________________  LABS (pertinent positives/negatives)  Lipase 34 Comprehensive metabolic panel significant for potassium 2.7 and cranny 1.01 and was without significant abnormality White blood count 7.5, hemoglobin 13.5 and platelet count 233 Troponin less than 0.03  ____________________________________________  RADIOLOGY All Xrays were viewed by me. Imaging interpreted by Radiologist.  Chest x-ray two-view:   IMPRESSION: 1. New right lower lobe airspace disease concerning for pneumonia. 2. Stable elevation of the right hemidiaphragm. __________________________________________  PROCEDURES  Procedure(s) performed: None  Critical Care performed: None  ____________________________________________   ED COURSE / ASSESSMENT AND PLAN  Pertinent labs & imaging results that were available during my care of the patient were reviewed by me and considered in my medical decision making (see chart for details).   Patient's initial concern was vomiting and inability to tolerate by mouth. Fluids and Zofran seemed to help with this. She gives a history though of being ill with upper respiratory  symptoms for almost a week, previously treated with a five-day course of azithromycin, then followed by a positive flu test and hospitalization for 2 days at [facility], followed by for 5 days at home before coming back here to the emergency department today.  Chest x-ray shows new right lower lobe airspace disease concerning for pneumonia. Patient is coughing and she states this is new from about 2 days ago. No fever here. No hypotension. She has had some intermittent tachycardia. Even after fluids she is still tachycardic to about 117 when she stands up. She also becomes a little short of breath. I think she does warrant observation overnight with the hypokalemia, and the pneumonia with persistent tachycardia.   CONSULTATIONS:   Dr. Vianne Bulls, hospitalist for observation admission   Patient / Family / Caregiver informed of clinical course, medical decision-making process, and agree with plan.     ___________________________________________   FINAL CLINICAL IMPRESSION(S) / ED DIAGNOSES   Final diagnoses:  Pneumonia involving right lung, unspecified part of lung  Non-intractable vomiting without nausea, vomiting of unspecified type  Hypokalemia  Tachycardia              Note: This dictation was prepared with Dragon dictation. Any transcriptional errors that result from this process are unintentional   Lisa Roca, MD 04/30/15 [id number]

## 2015-04-30 NOTE — ED Notes (Signed)
Patient was given ED sandwich tray when given medication. NAD.

## 2015-04-30 NOTE — ED Notes (Signed)
Pt. Was checked on and pt asked for a beverage. Pt was given item as requested. Nothing else was need by staff member per pt.

## 2015-04-30 NOTE — ED Notes (Signed)
Pt. Asked for a drink and with Rn permission, patient was given one. Pt. Verbalized that they did not need anything at this time from staff.

## 2015-04-30 NOTE — ED Notes (Signed)
Patient transported to x-ray. ?

## 2015-05-01 LAB — BASIC METABOLIC PANEL
Anion gap: 9 (ref 5–15)
BUN: 6 mg/dL (ref 6–20)
CO2: 24 mmol/L (ref 22–32)
Calcium: 8.4 mg/dL — ABNORMAL LOW (ref 8.9–10.3)
Chloride: 108 mmol/L (ref 101–111)
Creatinine, Ser: 0.92 mg/dL (ref 0.44–1.00)
GFR calc Af Amer: >60 mL/min (ref >60)
GFR calc non Af Amer: >60 mL/min (ref >60)
Glucose, Bld: 105 mg/dL — ABNORMAL HIGH (ref 65–99)
Potassium: 3.7 mmol/L (ref 3.5–5.1)
Sodium: 141 mmol/L (ref 135–145)

## 2015-05-01 LAB — EXPECTORATED SPUTUM ASSESSMENT W REFEX TO RESP CULTURE: Special Requests: NORMAL

## 2015-05-01 LAB — CBC
HCT: 36 % (ref 35.0–47.0)
Hemoglobin: 11.4 g/dL — ABNORMAL LOW (ref 12.0–16.0)
MCH: 24.6 pg — ABNORMAL LOW (ref 26.0–34.0)
MCHC: 31.8 g/dL — ABNORMAL LOW (ref 32.0–36.0)
MCV: 77.2 fL — ABNORMAL LOW (ref 80.0–100.0)
Platelets: 271 10*3/uL (ref 150–440)
RBC: 4.66 MIL/uL (ref 3.80–5.20)
RDW: 19.9 % — ABNORMAL HIGH (ref 11.5–14.5)
WBC: 6.6 10*3/uL (ref 3.6–11.0)

## 2015-05-01 LAB — GLUCOSE, CAPILLARY: Glucose-Capillary: 99 mg/dL (ref 65–99)

## 2015-05-01 LAB — EXPECTORATED SPUTUM ASSESSMENT W GRAM STAIN, RFLX TO RESP C

## 2015-05-01 NOTE — Progress Notes (Signed)
[place] [facility] [place] NAME: Laura Chen    MR#:  [id number]  DATE OF BIRTH:  05-23-1965  SUBJECTIVE:  CHIEF COMPLAINT:   Chief Complaint  Patient presents with  . Emesis    3 days ago was admitted to [facility] and found to have Flu- given tamiflu. Still had cough and nausea,found to have right lower lobe infiltrate.  REVIEW OF SYSTEMS:  CONSTITUTIONAL: No fever,positive for fatigue or weakness.  EYES: No blurred or double vision.  EARS, NOSE, AND THROAT: No tinnitus or ear pain.  RESPIRATORY: positive for cough, shortness of breath, no wheezing or hemoptysis.  CARDIOVASCULAR: No chest pain, orthopnea, edema.  GASTROINTESTINAL: No nausea, vomiting, diarrhea or abdominal pain.  GENITOURINARY: No dysuria, hematuria.  ENDOCRINE: No polyuria, nocturia,  HEMATOLOGY: No anemia, easy bruising or bleeding SKIN: No rash or lesion. MUSCULOSKELETAL: No joint pain or arthritis.   NEUROLOGIC: No tingling, numbness, weakness.  PSYCHIATRY: No anxiety or depression.   ROS  DRUG ALLERGIES:   Allergies  Allergen Reactions  . Ibuprofen Swelling  . Sulfa Antibiotics Rash and Other (See Comments)    Reaction:  Burning of feet   . Tape Itching and Rash  . Thiazide-Type Diuretics Rash    VITALS:  Blood pressure 112/60, pulse 91, temperature 98.2 F (36.8 C), temperature source Oral, resp. rate 16, height 5\' 5"  (1.651 m), weight 98.158 kg (216 lb 6.4 oz), SpO2 100 %.  PHYSICAL EXAMINATION:  GENERAL:  50 y.o.-year-old patient lying in the bed with no acute distress.  EYES: Pupils equal, round, reactive to light and accommodation. No scleral icterus. Extraocular muscles intact.  HEENT: Head atraumatic, normocephalic. Oropharynx and nasopharynx clear.  NECK:  Supple, no jugular venous distention. No thyroid enlargement, no tenderness.  LUNGS: Normal breath sounds bilaterally, no wheezing, some crepitation. No use of accessory muscles of respiration.   CARDIOVASCULAR: S1, S2 normal. No murmurs, rubs, or gallops.  ABDOMEN: Soft, nontender, nondistended. Bowel sounds present. No organomegaly or mass.  EXTREMITIES: No pedal edema, cyanosis, or clubbing.  NEUROLOGIC: Cranial nerves II through XII are intact. Muscle strength 5/5 in all extremities. Sensation intact. Gait not checked.  PSYCHIATRIC: The patient is alert and oriented x 3.  SKIN: No obvious rash, lesion, or ulcer.   Physical Exam LABORATORY PANEL:   CBC  Recent Labs Lab 05/01/15 0348  WBC 6.6  HGB 11.4*  HCT 36.0  PLT 271   ------------------------------------------------------------------------------------------------------------------  Chemistries   Recent Labs Lab 04/30/15 1322 05/01/15 0348  NA 139 141  K 2.7* 3.7  CL 101 108  CO2 23 24  GLUCOSE 104* 105*  BUN 7 6  CREATININE 1.01* 0.92  CALCIUM 9.1 8.4*  AST 44*  --   ALT 46  --   ALKPHOS 172*  --   BILITOT 0.8  --    ------------------------------------------------------------------------------------------------------------------  Cardiac Enzymes  Recent Labs Lab 04/30/15 1322  TROPONINI <0.03   ------------------------------------------------------------------------------------------------------------------  RADIOLOGY:  Dg Chest 2 View  04/30/2015  CLINICAL DATA:  Cough and congestion. Pneumonia and bronchitis. Leukemia. EXAM: CHEST - 2 VIEW COMPARISON:  Two-view chest x-ray a 04/19/2015. FINDINGS: The heart size is normal. Chronic elevation of the right hemidiaphragm is stable. Mild density projected posteriorly is concerning for early right lower lobe infection. The left lung is clear. The visualized soft tissues and bony thorax are unremarkable. IMPRESSION: 1. New right lower lobe airspace disease concerning for pneumonia. 2. Stable elevation of the right hemidiaphragm. Electronically Signed  By: San Morelle M.D.   On: 04/30/2015 15:17    ASSESSMENT AND PLAN:   Active  Problems:   Pneumonia  #1 nausea and vomiting, cough likely secondary to right-sided pneumonia: Continue Levaquin, IV hydration, IV nausea medicine. #2 hypokalemia replace the potassium. She has chronic hypokalemia with potassium baseline is 2.6. Oral potassium supplements. #3 recent diagnosis of flu: Patient may have post influenza fatigue at this time. 4..tachyardiac likely secondary to cough and pneumonia. Monitor On off unit telemetry continue hydration and, continue cough medicine with Tussionex. . #5 CML: Follows up Dr. Grayland Ormond. #6 rheumatoid arthritis: follows up with the [facility] rheumatology Hypertension: Continue current medicine #7 cavitary lung lesion: rheumatological lung nodules.   All the records are reviewed and case discussed with Care Management/Social Workerr. Management plans discussed with the patient, family and they are in agreement.  CODE STATUS: full  TOTAL TIME TAKING CARE OF THIS PATIENT: 35 minutes.   POSSIBLE D/C IN 1-2 DAYS, DEPENDING ON CLINICAL CONDITION.   Vaughan Basta M.D on 05/01/2015   Between 7am to 6pm - Pager - [phone]  After 6pm go to [email or web address] - password [email or web address] [facility]  Office  [phone]  CC: Primary care physician; Lavera Guise, MD  Note: This dictation was prepared with Dragon dictation along with smaller phrase technology. Any transcriptional errors that result from this process are unintentional.

## 2015-05-02 LAB — GLUCOSE, CAPILLARY: Glucose-Capillary: 79 mg/dL (ref 65–99)

## 2015-05-02 MED ORDER — HYDROCODONE-ACETAMINOPHEN 5-325 MG PO TABS: 1.0000 | ORAL_TABLET | Freq: Four times a day (QID) | ORAL | Status: DC | PRN

## 2015-05-02 MED ORDER — LEVOFLOXACIN 750 MG PO TABS: 750.0000 mg | ORAL_TABLET | Freq: Every day | ORAL | Status: DC

## 2015-05-02 NOTE — Discharge Summary (Signed)
[place] [facility] [place] NAME: Laura Chen    MR#:  [id number]  DATE OF BIRTH:  September 21, 1965  DATE OF ADMISSION:  04/30/2015 ADMITTING PHYSICIAN: Epifanio Lesches, MD  DATE OF DISCHARGE: 05/02/2015  PRIMARY CARE PHYSICIAN: Lavera Guise, MD    ADMISSION DIAGNOSIS:  Hypokalemia [E87.6] Tachycardia [R00.0] Pneumonia involving right lung, unspecified part of lung [J18.9] Non-intractable vomiting without nausea, vomiting of unspecified type [R11.11]  DISCHARGE DIAGNOSIS:  Active Problems:   Pneumonia   SECONDARY DIAGNOSIS:   Past Medical History  Diagnosis Date  . Rheumatoid arthritis(714.0) 1990s    Jacoud's arthropathy, previously treated with MTX, TNFa (remicade, enbrel, humira, orencia, rituximab, and actemra)  . Hypertension   . CML (chronic myeloid leukemia) ([place]) 2011    (Dr. Alyse Low) stopped gleevec 2/2 side effects  . History of shingles   . Osteopenia     due to prednisone use, was on reclast  . SLE (systemic lupus erythematosus) ([place]) 09/2009    Rheum-Dr. Joan Mayans  . Asthma   . GERD (gastroesophageal reflux disease)   . PUD (peptic ulcer disease)   . Thyroid goiter     Endo-Dr. Eddie Dibbles  . History of syphilis 1990    s/p treatment  . Leukemia [facility])     HOSPITAL COURSE:   #1 nausea and vomiting, cough likely secondary to right-sided pneumonia: Continue Levaquin, IV hydration, IV nausea medicine. fgelt much better , give 4 more days oral levaquin. #2 hypokalemia replace the potassium. She has chronic hypokalemia with potassium baseline is 2.6. Oral potassium supplements. #3 recent diagnosis of flu: Patient may have post influenza fatigue at this time. 4..tachyardiac likely secondary to cough and pneumonia. Monitor On off unit telemetry continue hydration and, continue cough medicine with Tussionex. . #5 CML: Follows up Dr. Grayland Ormond. #6 rheumatoid arthritis: follows up with the [facility]  rheumatology Hypertension: Continue current medicine #7 cavitary lung lesion: rheumatological lung nodules.   DISCHARGE CONDITIONS:   Stable.  CONSULTS OBTAINED:     DRUG ALLERGIES:   Allergies  Allergen Reactions  . Ibuprofen Swelling  . Sulfa Antibiotics Rash and Other (See Comments)    Reaction:  Burning of feet   . Tape Itching and Rash  . Thiazide-Type Diuretics Rash    DISCHARGE MEDICATIONS:   Current Discharge Medication List    START taking these medications   Details  HYDROcodone-acetaminophen (NORCO/VICODIN) 5-325 MG tablet Take 1 tablet by mouth every 6 (six) hours as needed for moderate pain or severe pain. Qty: 10 tablet, Refills: 0    !! levofloxacin (LEVAQUIN) 750 MG tablet Take 1 tablet (750 mg total) by mouth daily. Qty: 4 tablet, Refills: 0    !! levofloxacin (LEVAQUIN) 750 MG tablet Take 1 tablet (750 mg total) by mouth daily. Qty: 4 tablet, Refills: 0    ondansetron (ZOFRAN) 4 MG tablet Take 1 tablet (4 mg total) by mouth every 8 (eight) hours as needed for nausea or vomiting. Qty: 10 tablet, Refills: 0     !! - Potential duplicate medications found. Please discuss with provider.    CONTINUE these medications which have NOT CHANGED   Details  aspirin EC 81 MG tablet Take 81 mg by mouth every evening.     DULoxetine (CYMBALTA) 20 MG capsule Take 20 mg by mouth daily.     ICLUSIG 15 MG tablet Take 15 mg by mouth at bedtime.     Linaclotide (LINZESS) 290 MCG CAPS capsule Take 290 mcg by  mouth daily.    medroxyPROGESTERone (PROVERA) 10 MG tablet Take 10 mg by mouth every evening.    metolazone (ZAROXOLYN) 10 MG tablet Take 10-20 mg by mouth daily. Pt alternates between one and two tablets on a every other day basis.    omeprazole (PRILOSEC) 20 MG capsule Take 20 mg by mouth daily.     ondansetron (ZOFRAN-ODT) 4 MG disintegrating tablet Take 4 mg by mouth every 12 (twelve) hours as needed for nausea or vomiting.    Oxycodone HCl 10 MG TABS  Take 10 mg by mouth 3 (three) times daily as needed (for pain).    potassium chloride (K-DUR,KLOR-CON) 10 MEQ tablet Take 20 mEq by mouth 2 (two) times daily.    predniSONE (DELTASONE) 5 MG tablet Take 5 mg by mouth daily with breakfast.    rosuvastatin (CRESTOR) 5 MG tablet Take 5 mg by mouth daily with supper.    traMADol (ULTRAM) 50 MG tablet Take 100 mg by mouth 2 (two) times daily.    Vitamin D, Ergocalciferol, (DRISDOL) 50000 units CAPS capsule Take 50,000 Units by mouth every 7 (seven) days. Pt takes on Thursday.    aMILoride (MIDAMOR) 5 MG tablet TAKE 1 TABLET BY MOUTH TWICE DAILY Qty: 180 tablet, Refills: 1    chlorpheniramine-HYDROcodone (TUSSIONEX PENNKINETIC ER) 10-8 MG/5ML SUER Take 5 mLs by mouth 2 (two) times daily. Qty: 140 mL, Refills: 0      STOP taking these medications     hydrocortisone cream 1 %      azithromycin (ZITHROMAX Z-PAK) 250 MG tablet          DISCHARGE INSTRUCTIONS:    Follow with PMD in 1-2 weeks.  If you experience worsening of your admission symptoms, develop shortness of breath, life threatening emergency, suicidal or homicidal thoughts you must seek medical attention immediately by calling 911 or calling your MD immediately  if symptoms less severe.  You Must read complete instructions/literature along with all the possible adverse reactions/side effects for all the Medicines you take and that have been prescribed to you. Take any new Medicines after you have completely understood and accept all the possible adverse reactions/side effects.   Please note  You were cared for by a hospitalist during your hospital stay. If you have any questions about your discharge medications or the care you received while you were in the hospital after you are discharged, you can call the unit and asked to speak with the hospitalist on call if the hospitalist that took care of you is not available. Once you are discharged, your primary care physician will  handle any further medical issues. Please note that NO REFILLS for any discharge medications will be authorized once you are discharged, as it is imperative that you return to your primary care physician (or establish a relationship with a primary care physician if you do not have one) for your aftercare needs so that they can reassess your need for medications and monitor your lab values.    Today   CHIEF COMPLAINT:   Chief Complaint  Patient presents with  . Emesis    HISTORY OF PRESENT ILLNESS:  Laura Chen  is a 50 y.o. female with a known history of recent diagnosis of flu came in because of nausea, vomiting unable to keep anything down. Patient continues to have cough. Patient was at [facility] days ago and was diagnosed with flu and was given Tamiflu. Patient comes in here today with the nausea, vomiting persistently  feeling weak, cough and low-grade temperature. Chest x-ray showed right-sided pneumonia. Patient the not hypoxic but that she is persistently tachycardic with heart rate up to 1 14 bpm. Show persistent hypokalemia. She has history of CML, lupus, rheumatoid arthritis.   VITAL SIGNS:  Blood pressure 100/65, pulse 78, temperature 98.2 F (36.8 C), temperature source Oral, resp. rate 16, height 5\' 5"  (1.651 m), weight 102.785 kg (226 lb 9.6 oz), SpO2 99 %.  I/O:   Intake/Output Summary (Last 24 hours) at 05/02/15 1213 Last data filed at 05/02/15 1206  Gross per 24 hour  Intake 2496.75 ml  Output    600 ml  Net 1896.75 ml    PHYSICAL EXAMINATION:   GENERAL: 50 y.o.-year-old patient lying in the bed with no acute distress.  EYES: Pupils equal, round, reactive to light and accommodation. No scleral icterus. Extraocular muscles intact.  HEENT: Head atraumatic, normocephalic. Oropharynx and nasopharynx clear.  NECK: Supple, no jugular venous distention. No thyroid enlargement, no tenderness.  LUNGS: Normal breath sounds bilaterally, no wheezing, some  crepitation. No use of accessory muscles of respiration.  CARDIOVASCULAR: S1, S2 normal. No murmurs, rubs, or gallops.  ABDOMEN: Soft, nontender, nondistended. Bowel sounds present. No organomegaly or mass.  EXTREMITIES: No pedal edema, cyanosis, or clubbing.  NEUROLOGIC: Cranial nerves II through XII are intact. Muscle strength 5/5 in all extremities. Sensation intact. Gait not checked.  PSYCHIATRIC: The patient is alert and oriented x 3.  SKIN: No obvious rash, lesion, or ulcer.   DATA REVIEW:   CBC  Recent Labs Lab 05/01/15 0348  WBC 6.6  HGB 11.4*  HCT 36.0  PLT 271    Chemistries   Recent Labs Lab 04/30/15 1322 05/01/15 0348  NA 139 141  K 2.7* 3.7  CL 101 108  CO2 23 24  GLUCOSE 104* 105*  BUN 7 6  CREATININE 1.01* 0.92  CALCIUM 9.1 8.4*  AST 44*  --   ALT 46  --   ALKPHOS 172*  --   BILITOT 0.8  --     Cardiac Enzymes  Recent Labs Lab 04/30/15 1322  TROPONINI <0.03    Microbiology Results  Results for orders placed or performed during the hospital encounter of 04/30/15  Culture, blood (routine x 2)     Status: None (Preliminary result)   Collection Time: 04/30/15  8:03 PM  Result Value Ref Range Status   Specimen Description BLOOD RIGHT HAND  Final   Special Requests BOTTLES DRAWN AEROBIC AND ANAEROBIC [place]  Final   Culture NO GROWTH 2 DAYS  Final   Report Status PENDING  Incomplete  Culture, blood (routine x 2)     Status: None (Preliminary result)   Collection Time: 04/30/15  8:15 PM  Result Value Ref Range Status   Specimen Description BLOOD RIGHT HAND  Final   Special Requests BOTTLES DRAWN AEROBIC AND ANAEROBIC [place]  Final   Culture NO GROWTH 2 DAYS  Final   Report Status PENDING  Incomplete  Culture, expectorated sputum-assessment     Status: None   Collection Time: 05/01/15  8:02 AM  Result Value Ref Range Status   Specimen Description INDUCED SPUTUM  Final   Special Requests Normal  Final   Sputum  evaluation THIS SPECIMEN IS ACCEPTABLE FOR SPUTUM CULTURE  Final   Report Status 05/01/2015 FINAL  Final  Culture, respiratory (NON-Expectorated)     Status: None (Preliminary result)   Collection Time: 05/01/15  8:02 AM  Result Value Ref Range Status  Specimen Description INDUCED SPUTUM  Final   Special Requests Normal Reflexed from [id number]  Final   Gram Stain PENDING  Incomplete   Culture Consistent with normal respiratory flora.  Final   Report Status PENDING  Incomplete    RADIOLOGY:  Dg Chest 2 View  04/30/2015  CLINICAL DATA:  Cough and congestion. Pneumonia and bronchitis. Leukemia. EXAM: CHEST - 2 VIEW COMPARISON:  Two-view chest x-ray a 04/19/2015. FINDINGS: The heart size is normal. Chronic elevation of the right hemidiaphragm is stable. Mild density projected posteriorly is concerning for early right lower lobe infection. The left lung is clear. The visualized soft tissues and bony thorax are unremarkable. IMPRESSION: 1. New right lower lobe airspace disease concerning for pneumonia. 2. Stable elevation of the right hemidiaphragm. Electronically Signed   By: San Morelle M.D.   On: 04/30/2015 15:17      Management plans discussed with the patient, family and they are in agreement.  CODE STATUS:     Code Status Orders        Start     Ordered   04/30/15 1952  Full code   Continuous     04/30/15 1954    Code Status History    Date Active Date Inactive Code Status Order ID Comments User Context   This patient has a current code status but no historical code status.      TOTAL TIME TAKING CARE OF THIS PATIENT: 35 minutes.    Vaughan Basta M.D on 05/02/2015 at 12:13 PM  Between 7am to 6pm - Pager - [phone]  After 6pm go to [email or web address] - password [email or web address] [place] [facility]  Office  [phone]  CC: Primary care physician; Lavera Guise, MD   Note: This dictation was prepared with Dragon dictation along with smaller phrase  technology. Any transcriptional errors that result from this process are unintentional.

## 2015-05-02 NOTE — Progress Notes (Signed)
Patient's home medication returned back  - ponatinib.

## 2015-05-02 NOTE — Progress Notes (Signed)
Order to discharge patient to home today, mom at the bedside.  Discharge instructions given per MD order, home and new medication reviewed, rx.slip given for norco and levaquin.  Patient verbalized understanding of discharge instructions given.  Discharge via wheelchair

## 2015-05-03 LAB — CULTURE, RESPIRATORY W GRAM STAIN
Culture: NORMAL
Special Requests: NORMAL

## 2015-05-05 LAB — CULTURE, BLOOD (ROUTINE X 2)
Culture: NO GROWTH
Culture: NO GROWTH

## 2015-05-18 DIAGNOSIS — J309 Allergic rhinitis, unspecified: Secondary | ICD-10-CM | POA: Diagnosis not present

## 2015-05-18 DIAGNOSIS — M329 Systemic lupus erythematosus, unspecified: Secondary | ICD-10-CM | POA: Diagnosis not present

## 2015-05-18 DIAGNOSIS — J168 Pneumonia due to other specified infectious organisms: Secondary | ICD-10-CM | POA: Diagnosis not present

## 2015-05-18 DIAGNOSIS — B370 Candidal stomatitis: Secondary | ICD-10-CM | POA: Diagnosis not present

## 2015-05-18 DIAGNOSIS — C9210 Chronic myeloid leukemia, BCR/ABL-positive, not having achieved remission: Secondary | ICD-10-CM | POA: Diagnosis not present

## 2015-06-11 DIAGNOSIS — E782 Mixed hyperlipidemia: Secondary | ICD-10-CM | POA: Diagnosis not present

## 2015-06-11 DIAGNOSIS — M069 Rheumatoid arthritis, unspecified: Secondary | ICD-10-CM | POA: Diagnosis not present

## 2015-06-11 DIAGNOSIS — C9210 Chronic myeloid leukemia, BCR/ABL-positive, not having achieved remission: Secondary | ICD-10-CM | POA: Diagnosis not present

## 2015-06-11 DIAGNOSIS — Z124 Encounter for screening for malignant neoplasm of cervix: Secondary | ICD-10-CM | POA: Diagnosis not present

## 2015-06-11 DIAGNOSIS — R87810 Cervical high risk human papillomavirus (HPV) DNA test positive: Secondary | ICD-10-CM | POA: Diagnosis not present

## 2015-06-11 DIAGNOSIS — M329 Systemic lupus erythematosus, unspecified: Secondary | ICD-10-CM | POA: Diagnosis not present

## 2015-06-11 DIAGNOSIS — I10 Essential (primary) hypertension: Secondary | ICD-10-CM | POA: Diagnosis not present

## 2015-06-11 DIAGNOSIS — Z0001 Encounter for general adult medical examination with abnormal findings: Secondary | ICD-10-CM | POA: Diagnosis not present

## 2015-06-17 DIAGNOSIS — N939 Abnormal uterine and vaginal bleeding, unspecified: Secondary | ICD-10-CM | POA: Diagnosis not present

## 2015-06-23 DIAGNOSIS — Z1231 Encounter for screening mammogram for malignant neoplasm of breast: Secondary | ICD-10-CM | POA: Diagnosis not present

## 2015-06-24 DIAGNOSIS — N858 Other specified noninflammatory disorders of uterus: Secondary | ICD-10-CM | POA: Diagnosis not present

## 2015-06-24 DIAGNOSIS — N888 Other specified noninflammatory disorders of cervix uteri: Secondary | ICD-10-CM | POA: Diagnosis not present

## 2015-06-24 DIAGNOSIS — N939 Abnormal uterine and vaginal bleeding, unspecified: Secondary | ICD-10-CM | POA: Diagnosis not present

## 2015-06-24 DIAGNOSIS — R938 Abnormal findings on diagnostic imaging of other specified body structures: Secondary | ICD-10-CM | POA: Diagnosis not present

## 2015-07-07 DIAGNOSIS — N939 Abnormal uterine and vaginal bleeding, unspecified: Secondary | ICD-10-CM | POA: Diagnosis not present

## 2015-07-07 DIAGNOSIS — N888 Other specified noninflammatory disorders of cervix uteri: Secondary | ICD-10-CM | POA: Diagnosis not present

## 2015-07-12 DIAGNOSIS — M069 Rheumatoid arthritis, unspecified: Secondary | ICD-10-CM | POA: Diagnosis not present

## 2015-07-12 DIAGNOSIS — C9210 Chronic myeloid leukemia, BCR/ABL-positive, not having achieved remission: Secondary | ICD-10-CM | POA: Diagnosis not present

## 2015-07-12 DIAGNOSIS — M170 Bilateral primary osteoarthritis of knee: Secondary | ICD-10-CM | POA: Diagnosis not present

## 2015-07-12 DIAGNOSIS — M329 Systemic lupus erythematosus, unspecified: Secondary | ICD-10-CM | POA: Diagnosis not present

## 2015-07-12 DIAGNOSIS — Z79899 Other long term (current) drug therapy: Secondary | ICD-10-CM | POA: Diagnosis not present

## 2015-07-12 DIAGNOSIS — M810 Age-related osteoporosis without current pathological fracture: Secondary | ICD-10-CM | POA: Diagnosis not present

## 2015-07-12 DIAGNOSIS — M25559 Pain in unspecified hip: Secondary | ICD-10-CM | POA: Diagnosis not present

## 2015-07-20 ENCOUNTER — Ambulatory Visit: Payer: Medicare Other | Admitting: Oncology

## 2015-07-20 ENCOUNTER — Other Ambulatory Visit: Payer: Medicare Other

## 2015-07-26 ENCOUNTER — Inpatient Hospital Stay (HOSPITAL_BASED_OUTPATIENT_CLINIC_OR_DEPARTMENT_OTHER): Payer: Medicare Other | Admitting: Oncology

## 2015-07-26 ENCOUNTER — Inpatient Hospital Stay: Payer: Medicare Other | Attending: Oncology

## 2015-07-26 VITALS — BP 117/81 | HR 86 | Temp 98.6°F | Resp 16 | Wt 221.1 lb

## 2015-07-26 DIAGNOSIS — M329 Systemic lupus erythematosus, unspecified: Secondary | ICD-10-CM | POA: Diagnosis not present

## 2015-07-26 DIAGNOSIS — Z202 Contact with and (suspected) exposure to infections with a predominantly sexual mode of transmission: Secondary | ICD-10-CM | POA: Diagnosis not present

## 2015-07-26 DIAGNOSIS — M8580 Other specified disorders of bone density and structure, unspecified site: Secondary | ICD-10-CM | POA: Diagnosis not present

## 2015-07-26 DIAGNOSIS — K219 Gastro-esophageal reflux disease without esophagitis: Secondary | ICD-10-CM | POA: Diagnosis not present

## 2015-07-26 DIAGNOSIS — Z79899 Other long term (current) drug therapy: Secondary | ICD-10-CM

## 2015-07-26 DIAGNOSIS — I10 Essential (primary) hypertension: Secondary | ICD-10-CM | POA: Diagnosis not present

## 2015-07-26 DIAGNOSIS — K279 Peptic ulcer, site unspecified, unspecified as acute or chronic, without hemorrhage or perforation: Secondary | ICD-10-CM | POA: Insufficient documentation

## 2015-07-26 DIAGNOSIS — Z87891 Personal history of nicotine dependence: Secondary | ICD-10-CM | POA: Diagnosis not present

## 2015-07-26 DIAGNOSIS — Z8619 Personal history of other infectious and parasitic diseases: Secondary | ICD-10-CM | POA: Insufficient documentation

## 2015-07-26 DIAGNOSIS — M069 Rheumatoid arthritis, unspecified: Secondary | ICD-10-CM | POA: Insufficient documentation

## 2015-07-26 DIAGNOSIS — E876 Hypokalemia: Secondary | ICD-10-CM | POA: Diagnosis not present

## 2015-07-26 DIAGNOSIS — C9210 Chronic myeloid leukemia, BCR/ABL-positive, not having achieved remission: Secondary | ICD-10-CM | POA: Diagnosis not present

## 2015-07-26 LAB — MAGNESIUM: Magnesium: 2.1 mg/dL (ref 1.7–2.4)

## 2015-07-26 LAB — CBC WITH DIFFERENTIAL/PLATELET
Basophils Absolute: 0.1 10*3/uL (ref 0–0.1)
Basophils Relative: 1 %
Eosinophils Absolute: 0.2 10*3/uL (ref 0–0.7)
Eosinophils Relative: 2 %
HCT: 41.1 % (ref 35.0–47.0)
Hemoglobin: 13.1 g/dL (ref 12.0–16.0)
Lymphocytes Relative: 24 %
Lymphs Abs: 2.3 10*3/uL (ref 1.0–3.6)
MCH: 25.5 pg — ABNORMAL LOW (ref 26.0–34.0)
MCHC: 31.8 g/dL — ABNORMAL LOW (ref 32.0–36.0)
MCV: 80.2 fL (ref 80.0–100.0)
Monocytes Absolute: 0.8 10*3/uL (ref 0.2–0.9)
Monocytes Relative: 9 %
Neutro Abs: 6.1 10*3/uL (ref 1.4–6.5)
Neutrophils Relative %: 64 %
Platelets: 253 10*3/uL (ref 150–440)
RBC: 5.13 MIL/uL (ref 3.80–5.20)
RDW: 20.3 % — ABNORMAL HIGH (ref 11.5–14.5)
WBC: 9.6 10*3/uL (ref 3.6–11.0)

## 2015-07-26 LAB — COMPREHENSIVE METABOLIC PANEL
ALT: 84 U/L — ABNORMAL HIGH (ref 14–54)
AST: 42 U/L — ABNORMAL HIGH (ref 15–41)
Albumin: 3.9 g/dL (ref 3.5–5.0)
Alkaline Phosphatase: 261 U/L — ABNORMAL HIGH (ref 38–126)
Anion gap: 8 (ref 5–15)
BUN: 12 mg/dL (ref 6–20)
CO2: 30 mmol/L (ref 22–32)
Calcium: 8.7 mg/dL — ABNORMAL LOW (ref 8.9–10.3)
Chloride: 99 mmol/L — ABNORMAL LOW (ref 101–111)
Creatinine, Ser: 1 mg/dL (ref 0.44–1.00)
GFR calc Af Amer: >60 mL/min (ref >60)
GFR calc non Af Amer: >60 mL/min (ref >60)
Glucose, Bld: 192 mg/dL — ABNORMAL HIGH (ref 65–99)
Potassium: 2.4 mmol/L — CL (ref 3.5–5.1)
Sodium: 137 mmol/L (ref 135–145)
Total Bilirubin: 0.4 mg/dL (ref 0.3–1.2)
Total Protein: 7 g/dL (ref 6.5–8.1)

## 2015-07-26 MED ORDER — SODIUM CHLORIDE 0.9 % IV SOLN: Freq: Once | INTRAVENOUS | Status: DC

## 2015-07-26 NOTE — Progress Notes (Signed)
Patient does not offer any problems today.  

## 2015-07-26 NOTE — Progress Notes (Signed)
[place]  [phone] [phone]) [phone]  ID: [name] OB: 1965/08/08  MR#: [id number]  [id number]  Patient Care Team: Lavera Guise, MD as PCP - General (Internal Medicine)  CHIEF COMPLAINT:  Chief Complaint  Patient presents with  . CML    INTERVAL HISTORY: Patient returns to clinic today for repeat laboratory work and further evaluation.  She continues to tolerate Iclusig without significant side effects. She denies any fevers, night sweats, or weight loss.  She does not complain of pain today. She denies any chest pain or shortness of breath. She has a fair appetite. She has no nausea, vomiting, constipation, or diarrhea.  She has no urinary complaints. She offers no further specific complaints today.  REVIEW OF SYSTEMS:   Review of Systems  Constitutional: Negative for malaise/fatigue.  Respiratory: Negative.   Cardiovascular: Negative.   Gastrointestinal: Negative.   Musculoskeletal: Negative for joint pain.  Neurological: Negative for weakness.    As per HPI. Otherwise, a complete review of systems is negatve.  PAST MEDICAL HISTORY: Past Medical History  Diagnosis Date  . Rheumatoid arthritis(714.0) 1990s    Jacoud's arthropathy, previously treated with MTX, TNFa (remicade, enbrel, humira, orencia, rituximab, and actemra)  . Hypertension   . CML (chronic myeloid leukemia) ([place]) 2011    (Dr. Alyse Low) stopped gleevec 2/2 side effects  . History of shingles   . Osteopenia     due to prednisone use, was on reclast  . SLE (systemic lupus erythematosus) ([place]) 09/2009    Rheum-Dr. Joan Mayans  . Asthma   . GERD (gastroesophageal reflux disease)   . PUD (peptic ulcer disease)   . Thyroid goiter     Endo-Dr. Eddie Dibbles  . History of syphilis 1990    s/p treatment  . Leukemia ([place])     PAST SURGICAL HISTORY: Past Surgical History  Procedure Laterality Date  . Us/hsg  05/2010    possible endometrial polyp, rec rpt 8 wks  continue loestrin 24 [facility])  . Myomectomy  2008  . Dexa  2010    osteopenia  . Right ankle afo  2011  . Transthoracic echocardiogram  123456    nl systolic/diastolic fxn, EF [id number], mild MR, normal PA pressures    FAMILY HISTORY Family History  Problem Relation Age of Onset  . Hypertension Mother   . Hypertension      Aunt  . Heart attack Maternal Grandmother   . Coronary artery disease Maternal Grandmother        ADVANCED DIRECTIVES:    HEALTH MAINTENANCE: Social History  Substance Use Topics  . Smoking status: Former Smoker -- 0.30 packs/day    Types: Cigarettes  . Smokeless tobacco: Not on file     Comment: 6 cigarettes/day  . Alcohol Use: No    Allergies  Allergen Reactions  . Ibuprofen Swelling  . Sulfa Antibiotics Rash and Other (See Comments)    Reaction:  Burning of feet   . Tape Itching and Rash  . Thiazide-Type Diuretics Rash    Current Outpatient Prescriptions  Medication Sig Dispense Refill  . aspirin EC 81 MG tablet Take 81 mg by mouth every evening.     . DULoxetine (CYMBALTA) 20 MG capsule Take 20 mg by mouth daily.     Marland Kitchen HYDROcodone-acetaminophen (NORCO/VICODIN) 5-325 MG tablet Take 1 tablet by mouth every 6 (six) hours as needed for moderate pain or severe pain. 10 tablet 0  . ICLUSIG 15 MG tablet Take 15 mg by mouth at  bedtime.     . Linaclotide (LINZESS) 290 MCG CAPS capsule Take 290 mcg by mouth daily.    . medroxyPROGESTERone (PROVERA) 10 MG tablet Take 10 mg by mouth every evening.    . metolazone (ZAROXOLYN) 10 MG tablet Take 10-20 mg by mouth daily. Pt alternates between one and two tablets on a every other day basis.    Marland Kitchen omeprazole (PRILOSEC) 20 MG capsule Take 20 mg by mouth daily.     . Oxycodone HCl 10 MG TABS Take 10 mg by mouth 3 (three) times daily as needed (for pain).    . potassium chloride (K-DUR,KLOR-CON) 10 MEQ tablet Take 20 mEq by mouth 2 (two) times daily.    . predniSONE (DELTASONE) 5 MG tablet Take 5 mg by mouth daily  with breakfast.    . rosuvastatin (CRESTOR) 5 MG tablet Take 5 mg by mouth daily with supper.    . traMADol (ULTRAM) 50 MG tablet Take 100 mg by mouth 2 (two) times daily.    . Vitamin D, Ergocalciferol, (DRISDOL) 50000 units CAPS capsule Take 50,000 Units by mouth every 7 (seven) days. Pt takes on Thursday.     Current Facility-Administered Medications  Medication Dose Route Frequency Provider Last Rate Last Dose  . [START ON 07/27/2015] sodium chloride 0.9 % 250 mL with potassium chloride 40 mEq infusion   Intravenous Once Mayra Reel, NP        OBJECTIVE: Filed Vitals:   07/26/15 1517  BP: 117/81  Pulse: 86  Temp: 98.6 F (37 C)  Resp: 16     Body mass index is 36.8 kg/(m^2).    ECOG FS:1 - Symptomatic but completely ambulatory  General: Well-developed, well-nourished, no acute distress. Eyes: Pink conjunctiva, anicteric sclera. Lungs: Clear to auscultation bilaterally. Heart: Regular rate and rhythm. No rubs, murmurs, or gallops. Abdomen: Soft, nontender, nondistended. No organomegaly noted, normoactive bowel sounds. Musculoskeletal: No edema, cyanosis, or clubbing. Neuro: Alert, answering all questions appropriately. Cranial nerves grossly intact. Skin: No rashes or petechiae noted. Psych: Normal affect.   LAB RESULTS:  Lab Results  Component Value Date   NA 137 07/26/2015   K 2.4* 07/26/2015   CL 99* 07/26/2015   CO2 30 07/26/2015   GLUCOSE 192* 07/26/2015   BUN 12 07/26/2015   CREATININE 1.00 07/26/2015   CALCIUM 8.7* 07/26/2015   PROT 7.0 07/26/2015   ALBUMIN 3.9 07/26/2015   AST 42* 07/26/2015   ALT 84* 07/26/2015   ALKPHOS 261* 07/26/2015   BILITOT 0.4 07/26/2015   GFRNONAA >60 07/26/2015   GFRAA >60 07/26/2015    Lab Results  Component Value Date   WBC 9.6 07/26/2015   NEUTROABS 6.1 07/26/2015   HGB 13.1 07/26/2015   HCT 41.1 07/26/2015   MCV 80.2 07/26/2015   PLT 253 07/26/2015     STUDIES: No results found.  ASSESSMENT: CML  PLAN:     1.  CML:  Previously, patient could not tolerate Gleevec, Sprycel, Tasigna, and Bosulif.  She is able to tolerate Ponatinib (Iclusig), which she will continue with this daily or until progression of disease.  She has acknowledged the blackbox warning and risks of this medication.  Recent BCR-ABL transcript recorded at [facility] was improved. Today's result is pending. Return to clinic in 6 months with repeat laboratory work and further evaluation. This scheduling will allow her to alternate visits with Dr. Neldon Labella from [facility] every 3 months.  2.  Rheumatoid arthritis: Rituxan every 6 months per [facility], Dr.  Stann Mainland.   3.  Hypertension: Blood pressure is within normal limits today, continue current treatment regimen. 4.  Cavitary lung lesions: Rheumatologic nodules, treatment as above. 5.  Hypokalemia: 2.4 today. It is too late to start IV potassium today. Patient will RTC tomorrow for IV potassium and she will increase her oral potassium supplementation from her current 20 meq BID to 40 meq BID. She will RTC Friday for repeat lab work to check potassium level and possible IV potassium.   Patient expressed understanding and was in agreement with this plan. She also understands that She can call clinic at any time with any questions, concerns, or complaints.     Mayra Reel, NP   07/26/2015 3:54 PM

## 2015-07-27 DIAGNOSIS — M05742 Rheumatoid arthritis with rheumatoid factor of left hand without organ or systems involvement: Secondary | ICD-10-CM | POA: Diagnosis not present

## 2015-07-27 DIAGNOSIS — Z7952 Long term (current) use of systemic steroids: Secondary | ICD-10-CM | POA: Diagnosis not present

## 2015-07-27 DIAGNOSIS — M05741 Rheumatoid arthritis with rheumatoid factor of right hand without organ or systems involvement: Secondary | ICD-10-CM | POA: Diagnosis not present

## 2015-07-27 DIAGNOSIS — M859 Disorder of bone density and structure, unspecified: Secondary | ICD-10-CM | POA: Diagnosis not present

## 2015-07-27 DIAGNOSIS — M85862 Other specified disorders of bone density and structure, left lower leg: Secondary | ICD-10-CM | POA: Diagnosis not present

## 2015-07-28 ENCOUNTER — Inpatient Hospital Stay: Payer: Medicare Other

## 2015-07-28 ENCOUNTER — Other Ambulatory Visit: Payer: Self-pay | Admitting: Oncology

## 2015-07-28 VITALS — BP 123/83 | HR 77 | Temp 98.1°F | Resp 18

## 2015-07-28 DIAGNOSIS — K279 Peptic ulcer, site unspecified, unspecified as acute or chronic, without hemorrhage or perforation: Secondary | ICD-10-CM | POA: Diagnosis not present

## 2015-07-28 DIAGNOSIS — E876 Hypokalemia: Secondary | ICD-10-CM

## 2015-07-28 DIAGNOSIS — Z8619 Personal history of other infectious and parasitic diseases: Secondary | ICD-10-CM | POA: Diagnosis not present

## 2015-07-28 DIAGNOSIS — I10 Essential (primary) hypertension: Secondary | ICD-10-CM | POA: Diagnosis not present

## 2015-07-28 DIAGNOSIS — M069 Rheumatoid arthritis, unspecified: Secondary | ICD-10-CM | POA: Diagnosis not present

## 2015-07-28 DIAGNOSIS — M8580 Other specified disorders of bone density and structure, unspecified site: Secondary | ICD-10-CM | POA: Diagnosis not present

## 2015-07-28 DIAGNOSIS — M329 Systemic lupus erythematosus, unspecified: Secondary | ICD-10-CM | POA: Diagnosis not present

## 2015-07-28 DIAGNOSIS — Z79899 Other long term (current) drug therapy: Secondary | ICD-10-CM | POA: Diagnosis not present

## 2015-07-28 DIAGNOSIS — Z87891 Personal history of nicotine dependence: Secondary | ICD-10-CM | POA: Diagnosis not present

## 2015-07-28 DIAGNOSIS — K219 Gastro-esophageal reflux disease without esophagitis: Secondary | ICD-10-CM | POA: Diagnosis not present

## 2015-07-28 DIAGNOSIS — C9210 Chronic myeloid leukemia, BCR/ABL-positive, not having achieved remission: Secondary | ICD-10-CM | POA: Diagnosis not present

## 2015-07-28 MED ORDER — SODIUM CHLORIDE 0.9 % IV SOLN
Freq: Once | INTRAVENOUS | Status: AC
  Administered 2015-07-28: 11:00:00 via INTRAVENOUS
  Filled 2015-07-28: qty 20

## 2015-07-29 LAB — BCR-ABL1, CML/ALL, PCR, QUANT: b3a2 transcript: 0.01 %

## 2015-07-30 ENCOUNTER — Inpatient Hospital Stay: Payer: Medicare Other

## 2015-07-30 ENCOUNTER — Other Ambulatory Visit: Payer: Self-pay | Admitting: Oncology

## 2015-07-30 ENCOUNTER — Other Ambulatory Visit: Payer: Self-pay | Admitting: *Deleted

## 2015-07-30 DIAGNOSIS — E876 Hypokalemia: Secondary | ICD-10-CM

## 2015-07-30 DIAGNOSIS — Z8619 Personal history of other infectious and parasitic diseases: Secondary | ICD-10-CM | POA: Diagnosis not present

## 2015-07-30 DIAGNOSIS — M329 Systemic lupus erythematosus, unspecified: Secondary | ICD-10-CM | POA: Diagnosis not present

## 2015-07-30 DIAGNOSIS — M069 Rheumatoid arthritis, unspecified: Secondary | ICD-10-CM | POA: Diagnosis not present

## 2015-07-30 DIAGNOSIS — M8580 Other specified disorders of bone density and structure, unspecified site: Secondary | ICD-10-CM | POA: Diagnosis not present

## 2015-07-30 DIAGNOSIS — K219 Gastro-esophageal reflux disease without esophagitis: Secondary | ICD-10-CM | POA: Diagnosis not present

## 2015-07-30 DIAGNOSIS — Z79899 Other long term (current) drug therapy: Secondary | ICD-10-CM | POA: Diagnosis not present

## 2015-07-30 DIAGNOSIS — C9210 Chronic myeloid leukemia, BCR/ABL-positive, not having achieved remission: Secondary | ICD-10-CM | POA: Diagnosis not present

## 2015-07-30 DIAGNOSIS — I10 Essential (primary) hypertension: Secondary | ICD-10-CM | POA: Diagnosis not present

## 2015-07-30 DIAGNOSIS — K279 Peptic ulcer, site unspecified, unspecified as acute or chronic, without hemorrhage or perforation: Secondary | ICD-10-CM | POA: Diagnosis not present

## 2015-07-30 DIAGNOSIS — Z87891 Personal history of nicotine dependence: Secondary | ICD-10-CM | POA: Diagnosis not present

## 2015-07-30 LAB — POTASSIUM: Potassium: 3 mmol/L — ABNORMAL LOW (ref 3.5–5.1)

## 2015-07-30 MED ORDER — SODIUM CHLORIDE 0.9 % IV SOLN
INTRAVENOUS | Status: DC
  Administered 2015-07-30: 11:00:00 via INTRAVENOUS
  Filled 2015-07-30: qty 1000

## 2015-07-30 MED ORDER — POTASSIUM CHLORIDE CRYS ER 10 MEQ PO TBCR: 20.0000 meq | EXTENDED_RELEASE_TABLET | Freq: Two times a day (BID) | ORAL | Status: DC

## 2015-07-30 MED ORDER — SODIUM CHLORIDE 0.9 % IV SOLN
Freq: Once | INTRAVENOUS | Status: AC
  Administered 2015-07-30: 11:00:00 via INTRAVENOUS
  Filled 2015-07-30: qty 10

## 2015-07-30 NOTE — Telephone Encounter (Signed)
Do you want to refill Potassium for her?

## 2015-08-02 MED ORDER — POTASSIUM CHLORIDE CRYS ER 20 MEQ PO TBCR: 40.0000 meq | EXTENDED_RELEASE_TABLET | Freq: Two times a day (BID) | ORAL | Status: DC

## 2015-08-02 NOTE — Addendum Note (Signed)
Addended by: Betti Cruz on: 08/02/2015 02:53 PM   Modules accepted: Orders

## 2015-08-03 NOTE — Progress Notes (Signed)
07/26/2015 visit - Dr. Grayland Ormond was available for consultation and review of plan of care for this patient

## 2015-09-21 DIAGNOSIS — M069 Rheumatoid arthritis, unspecified: Secondary | ICD-10-CM | POA: Diagnosis not present

## 2015-09-21 DIAGNOSIS — Z5112 Encounter for antineoplastic immunotherapy: Secondary | ICD-10-CM | POA: Diagnosis not present

## 2015-09-21 DIAGNOSIS — C9210 Chronic myeloid leukemia, BCR/ABL-positive, not having achieved remission: Secondary | ICD-10-CM | POA: Diagnosis not present

## 2015-09-24 DIAGNOSIS — C9210 Chronic myeloid leukemia, BCR/ABL-positive, not having achieved remission: Secondary | ICD-10-CM | POA: Diagnosis not present

## 2015-09-24 DIAGNOSIS — M069 Rheumatoid arthritis, unspecified: Secondary | ICD-10-CM | POA: Diagnosis not present

## 2015-09-24 DIAGNOSIS — M329 Systemic lupus erythematosus, unspecified: Secondary | ICD-10-CM | POA: Diagnosis not present

## 2015-10-05 DIAGNOSIS — M320 Drug-induced systemic lupus erythematosus: Secondary | ICD-10-CM | POA: Diagnosis not present

## 2015-10-05 DIAGNOSIS — M069 Rheumatoid arthritis, unspecified: Secondary | ICD-10-CM | POA: Diagnosis not present

## 2015-10-07 DIAGNOSIS — M069 Rheumatoid arthritis, unspecified: Secondary | ICD-10-CM | POA: Diagnosis not present

## 2015-10-07 DIAGNOSIS — E876 Hypokalemia: Secondary | ICD-10-CM | POA: Diagnosis not present

## 2015-10-07 DIAGNOSIS — J309 Allergic rhinitis, unspecified: Secondary | ICD-10-CM | POA: Diagnosis not present

## 2015-10-07 DIAGNOSIS — E782 Mixed hyperlipidemia: Secondary | ICD-10-CM | POA: Diagnosis not present

## 2015-10-07 DIAGNOSIS — C9210 Chronic myeloid leukemia, BCR/ABL-positive, not having achieved remission: Secondary | ICD-10-CM | POA: Diagnosis not present

## 2015-11-05 DIAGNOSIS — C9210 Chronic myeloid leukemia, BCR/ABL-positive, not having achieved remission: Secondary | ICD-10-CM | POA: Diagnosis not present

## 2015-11-15 DIAGNOSIS — Z7951 Long term (current) use of inhaled steroids: Secondary | ICD-10-CM | POA: Diagnosis not present

## 2015-11-15 DIAGNOSIS — E119 Type 2 diabetes mellitus without complications: Secondary | ICD-10-CM | POA: Diagnosis not present

## 2015-11-15 DIAGNOSIS — M069 Rheumatoid arthritis, unspecified: Secondary | ICD-10-CM | POA: Diagnosis not present

## 2015-11-15 DIAGNOSIS — M7072 Other bursitis of hip, left hip: Secondary | ICD-10-CM | POA: Diagnosis not present

## 2015-11-15 DIAGNOSIS — Z7902 Long term (current) use of antithrombotics/antiplatelets: Secondary | ICD-10-CM | POA: Diagnosis not present

## 2015-11-15 DIAGNOSIS — R740 Nonspecific elevation of levels of transaminase and lactic acid dehydrogenase [LDH]: Secondary | ICD-10-CM | POA: Diagnosis not present

## 2015-11-15 DIAGNOSIS — I110 Hypertensive heart disease with heart failure: Secondary | ICD-10-CM | POA: Diagnosis not present

## 2015-11-15 DIAGNOSIS — M1711 Unilateral primary osteoarthritis, right knee: Secondary | ICD-10-CM | POA: Diagnosis not present

## 2015-11-15 DIAGNOSIS — M810 Age-related osteoporosis without current pathological fracture: Secondary | ICD-10-CM | POA: Diagnosis not present

## 2015-11-15 DIAGNOSIS — Z87891 Personal history of nicotine dependence: Secondary | ICD-10-CM | POA: Diagnosis not present

## 2015-11-15 DIAGNOSIS — M7071 Other bursitis of hip, right hip: Secondary | ICD-10-CM | POA: Diagnosis not present

## 2015-11-15 DIAGNOSIS — C9211 Chronic myeloid leukemia, BCR/ABL-positive, in remission: Secondary | ICD-10-CM | POA: Diagnosis not present

## 2015-11-15 DIAGNOSIS — R918 Other nonspecific abnormal finding of lung field: Secondary | ICD-10-CM | POA: Diagnosis not present

## 2015-11-15 DIAGNOSIS — M7062 Trochanteric bursitis, left hip: Secondary | ICD-10-CM | POA: Diagnosis not present

## 2015-11-15 DIAGNOSIS — I5030 Unspecified diastolic (congestive) heart failure: Secondary | ICD-10-CM | POA: Diagnosis not present

## 2015-11-15 DIAGNOSIS — M329 Systemic lupus erythematosus, unspecified: Secondary | ICD-10-CM | POA: Diagnosis not present

## 2015-11-15 DIAGNOSIS — M7061 Trochanteric bursitis, right hip: Secondary | ICD-10-CM | POA: Diagnosis not present

## 2015-11-15 DIAGNOSIS — E785 Hyperlipidemia, unspecified: Secondary | ICD-10-CM | POA: Diagnosis not present

## 2015-11-15 DIAGNOSIS — M05741 Rheumatoid arthritis with rheumatoid factor of right hand without organ or systems involvement: Secondary | ICD-10-CM | POA: Diagnosis not present

## 2015-11-15 DIAGNOSIS — M1712 Unilateral primary osteoarthritis, left knee: Secondary | ICD-10-CM | POA: Diagnosis not present

## 2015-11-15 DIAGNOSIS — E876 Hypokalemia: Secondary | ICD-10-CM | POA: Diagnosis not present

## 2015-11-15 DIAGNOSIS — Z886 Allergy status to analgesic agent status: Secondary | ICD-10-CM | POA: Diagnosis not present

## 2015-11-15 DIAGNOSIS — K219 Gastro-esophageal reflux disease without esophagitis: Secondary | ICD-10-CM | POA: Diagnosis not present

## 2015-11-15 DIAGNOSIS — Z882 Allergy status to sulfonamides status: Secondary | ICD-10-CM | POA: Diagnosis not present

## 2015-11-15 DIAGNOSIS — M05742 Rheumatoid arthritis with rheumatoid factor of left hand without organ or systems involvement: Secondary | ICD-10-CM | POA: Diagnosis not present

## 2015-11-15 DIAGNOSIS — M0579 Rheumatoid arthritis with rheumatoid factor of multiple sites without organ or systems involvement: Secondary | ICD-10-CM | POA: Diagnosis not present

## 2015-11-22 DIAGNOSIS — M069 Rheumatoid arthritis, unspecified: Secondary | ICD-10-CM | POA: Diagnosis not present

## 2015-11-22 DIAGNOSIS — R918 Other nonspecific abnormal finding of lung field: Secondary | ICD-10-CM | POA: Diagnosis not present

## 2015-11-22 DIAGNOSIS — B354 Tinea corporis: Secondary | ICD-10-CM | POA: Diagnosis not present

## 2015-11-22 DIAGNOSIS — K760 Fatty (change of) liver, not elsewhere classified: Secondary | ICD-10-CM | POA: Diagnosis not present

## 2015-11-22 DIAGNOSIS — J9811 Atelectasis: Secondary | ICD-10-CM | POA: Diagnosis not present

## 2015-11-22 DIAGNOSIS — C9210 Chronic myeloid leukemia, BCR/ABL-positive, not having achieved remission: Secondary | ICD-10-CM | POA: Diagnosis not present

## 2015-11-22 DIAGNOSIS — R911 Solitary pulmonary nodule: Secondary | ICD-10-CM | POA: Diagnosis not present

## 2015-11-22 DIAGNOSIS — J479 Bronchiectasis, uncomplicated: Secondary | ICD-10-CM | POA: Diagnosis not present

## 2015-11-22 DIAGNOSIS — K5900 Constipation, unspecified: Secondary | ICD-10-CM | POA: Diagnosis not present

## 2015-11-22 DIAGNOSIS — R21 Rash and other nonspecific skin eruption: Secondary | ICD-10-CM | POA: Diagnosis not present

## 2015-11-22 DIAGNOSIS — E876 Hypokalemia: Secondary | ICD-10-CM | POA: Diagnosis not present

## 2015-12-29 DIAGNOSIS — R7989 Other specified abnormal findings of blood chemistry: Secondary | ICD-10-CM | POA: Diagnosis not present

## 2015-12-29 DIAGNOSIS — C9210 Chronic myeloid leukemia, BCR/ABL-positive, not having achieved remission: Secondary | ICD-10-CM | POA: Diagnosis not present

## 2016-01-21 ENCOUNTER — Other Ambulatory Visit: Payer: Self-pay | Admitting: *Deleted

## 2016-01-21 DIAGNOSIS — C9210 Chronic myeloid leukemia, BCR/ABL-positive, not having achieved remission: Secondary | ICD-10-CM

## 2016-01-25 NOTE — Progress Notes (Signed)
Laura Chen  Telephone:(336) [phone] Fax:(336) [phone]  ID: Laura Chen OB: [birth year]  MR#: [id number]  [id number]  Patient Care Team: Lavera Guise, MD as PCP - General (Internal Medicine)  CHIEF COMPLAINT: CML  INTERVAL HISTORY: Patient returns to clinic today for repeat laboratory work and further evaluation.  She continues to tolerate Iclusig without significant side effects. She denies any fevers, night sweats, or weight loss.  She has chronic pain from her arthritis. She denies any chest pain or shortness of breath. She has a fair appetite. She has no nausea, vomiting, constipation, or diarrhea.  She has no urinary complaints. She offers no further specific complaints today.  REVIEW OF SYSTEMS:   Review of Systems  Constitutional: Negative.  Negative for fever, malaise/fatigue and weight loss.  Respiratory: Negative.  Negative for cough and shortness of breath.   Cardiovascular: Negative.  Negative for chest pain and leg swelling.  Gastrointestinal: Negative.  Negative for abdominal pain.  Genitourinary: Negative.   Musculoskeletal: Positive for back pain, joint pain and neck pain.  Neurological: Positive for focal weakness. Negative for weakness.  Psychiatric/Behavioral: Negative.  The patient is not nervous/anxious.     As per HPI. Otherwise, a complete review of systems is negative.  PAST MEDICAL HISTORY: Past Medical History:  Diagnosis Date  . Asthma   . CML (chronic myeloid leukemia) ([place]) 2011   (Dr. Alyse Low) stopped gleevec 2/2 side effects  . GERD (gastroesophageal reflux disease)   . History of shingles   . History of syphilis 1990   s/p treatment  . Hypertension   . Leukemia ([place])   . Osteopenia    due to prednisone use, was on reclast  . PUD (peptic ulcer disease)   . Rheumatoid arthritis(714.0) 1990s   Jacoud's arthropathy, previously treated with MTX, TNFa (remicade, enbrel, humira, orencia, rituximab, and actemra)  .  SLE (systemic lupus erythematosus) ([place]) 09/2009   Rheum-Dr. Joan Mayans  . Thyroid goiter    Endo-Dr. Eddie Dibbles    PAST SURGICAL HISTORY: Past Surgical History:  Procedure Laterality Date  . DEXA  2010   osteopenia  . MYOMECTOMY  2008  . right ankle AFO  2011  . TRANSTHORACIC ECHOCARDIOGRAM  123456   nl systolic/diastolic fxn, EF [id number], mild MR, normal PA pressures  . US/HSG  05/2010   possible endometrial polyp, rec rpt 8 wks continue loestrin 24 ([place])    FAMILY HISTORY Family History  Problem Relation Age of Onset  . Hypertension Mother   . Hypertension      Aunt  . Heart attack Maternal Grandmother   . Coronary artery disease Maternal Grandmother        ADVANCED DIRECTIVES:    HEALTH MAINTENANCE: Social History  Substance Use Topics  . Smoking status: Former Smoker    Packs/day: 0.30    Types: Cigarettes  . Smokeless tobacco: Not on file     Comment: 6 cigarettes/day  . Alcohol use No    Allergies  Allergen Reactions  . Ibuprofen Swelling  . Sulfa Antibiotics Rash and Other (See Comments)    Reaction:  Burning of feet   . Tape Itching and Rash  . Thiazide-Type Diuretics Rash    Current Outpatient Prescriptions  Medication Sig Dispense Refill  . aspirin EC 81 MG tablet Take 81 mg by mouth every evening.     . DULoxetine (CYMBALTA) 20 MG capsule Take 20 mg by mouth daily.     . ICLUSIG 15 MG tablet Take 15 mg  by mouth at bedtime.     . Linaclotide (LINZESS) 290 MCG CAPS capsule Take 290 mcg by mouth daily.    . medroxyPROGESTERone (PROVERA) 10 MG tablet Take 10 mg by mouth every evening.    . metolazone (ZAROXOLYN) 10 MG tablet Take 10-20 mg by mouth daily. Pt alternates between one and two tablets on a every other day basis.    Marland Kitchen omeprazole (PRILOSEC) 20 MG capsule Take 20 mg by mouth daily.     . Oxycodone HCl 10 MG TABS Take 10 mg by mouth 3 (three) times daily as needed (for pain).    . potassium chloride SA (K-DUR,KLOR-CON) 20 MEQ tablet Take 2  tablets (40 mEq total) by mouth 2 (two) times daily. 120 tablet 0  . predniSONE (DELTASONE) 5 MG tablet Take 5 mg by mouth daily with breakfast.    . rosuvastatin (CRESTOR) 5 MG tablet Take 5 mg by mouth daily with supper.    . traMADol (ULTRAM) 50 MG tablet Take 100 mg by mouth 2 (two) times daily.    . Vitamin D, Ergocalciferol, (DRISDOL) 50000 units CAPS capsule Take 50,000 Units by mouth every 7 (seven) days. Pt takes on Thursday.     No current facility-administered medications for this visit.     OBJECTIVE: Vitals:   01/26/16 1116  BP: (!) 151/77  Pulse: 81  Resp: 18  Temp: 98.6 F (37 C)     Body mass index is 38.98 kg/m.    ECOG FS:1 - Symptomatic but completely ambulatory  General: Well-developed, well-nourished, no acute distress. Eyes: Pink conjunctiva, anicteric sclera. Lungs: Clear to auscultation bilaterally. Heart: Regular rate and rhythm. No rubs, murmurs, or gallops. Abdomen: Soft, nontender, nondistended. No organomegaly noted, normoactive bowel sounds. Musculoskeletal: No edema, cyanosis, or clubbing. Neuro: Alert, answering all questions appropriately. Cranial nerves grossly intact. Skin: No rashes or petechiae noted. Psych: Normal affect.   LAB RESULTS:  Lab Results  Component Value Date   NA 138 01/26/2016   K 2.7 (LL) 01/26/2016   CL 99 (L) 01/26/2016   CO2 31 01/26/2016   GLUCOSE 165 (H) 01/26/2016   BUN 13 01/26/2016   CREATININE 1.24 (H) 01/26/2016   CALCIUM 8.3 (L) 01/26/2016   PROT 6.4 (L) 01/26/2016   ALBUMIN 3.5 01/26/2016   AST 43 (H) 01/26/2016   ALT 60 (H) 01/26/2016   ALKPHOS 214 (H) 01/26/2016   BILITOT 0.3 01/26/2016   GFRNONAA 51 (L) 01/26/2016   GFRAA 59 (L) 01/26/2016    Lab Results  Component Value Date   WBC 9.6 01/26/2016   NEUTROABS 5.6 01/26/2016   HGB 13.1 01/26/2016   HCT 41.7 01/26/2016   MCV 81.5 01/26/2016   PLT 240 01/26/2016     STUDIES: No results found.  ASSESSMENT: CML  PLAN:    1.  CML:   Previously, patient could not tolerate Gleevec, Sprycel, Tasigna, and Bosulif.  She is able to tolerate Ponatinib (Iclusig), which she will continue with this daily or until progression of disease.  She has acknowledged the blackbox warning and risks of this medication.  Recent BCR-ABL transcript recorded at [facility] was improved. Today's result is pending. Return to clinic in 6 months with repeat laboratory work and further evaluation. This scheduling will allow her to alternate visits with Dr. Neldon Labella [facility] and [facility] every 3 months.  2.  Rheumatoid arthritis: Rituxan every 6 months per [facility].   3.  Hypertension: Blood pressure is elevated today, continue current treatment regimen. 4.  Cavitary lung lesions: Rheumatologic nodules, treatment as above. 5.  Hypokalemia: 2.7 today. Patient does not wish to have IV potassium today. Continue 40 meq oral supplementation BID.   Patient expressed understanding and was in agreement with this plan. She also understands that She can call clinic at any time with any questions, concerns, or complaints.     Lloyd Huger, MD   01/26/2016 11:41 AM

## 2016-01-26 ENCOUNTER — Inpatient Hospital Stay (HOSPITAL_BASED_OUTPATIENT_CLINIC_OR_DEPARTMENT_OTHER): Payer: Medicare Other | Admitting: Oncology

## 2016-01-26 ENCOUNTER — Inpatient Hospital Stay: Payer: Medicare Other | Attending: Oncology

## 2016-01-26 VITALS — BP 151/77 | HR 81 | Temp 98.6°F | Resp 18 | Wt 234.2 lb

## 2016-01-26 DIAGNOSIS — Z7982 Long term (current) use of aspirin: Secondary | ICD-10-CM | POA: Insufficient documentation

## 2016-01-26 DIAGNOSIS — E876 Hypokalemia: Secondary | ICD-10-CM | POA: Diagnosis not present

## 2016-01-26 DIAGNOSIS — Z8619 Personal history of other infectious and parasitic diseases: Secondary | ICD-10-CM | POA: Diagnosis not present

## 2016-01-26 DIAGNOSIS — C9210 Chronic myeloid leukemia, BCR/ABL-positive, not having achieved remission: Secondary | ICD-10-CM | POA: Diagnosis not present

## 2016-01-26 DIAGNOSIS — M329 Systemic lupus erythematosus, unspecified: Secondary | ICD-10-CM | POA: Insufficient documentation

## 2016-01-26 DIAGNOSIS — Z79899 Other long term (current) drug therapy: Secondary | ICD-10-CM | POA: Diagnosis not present

## 2016-01-26 DIAGNOSIS — Z048 Encounter for examination and observation for other specified reasons: Secondary | ICD-10-CM | POA: Insufficient documentation

## 2016-01-26 DIAGNOSIS — R918 Other nonspecific abnormal finding of lung field: Secondary | ICD-10-CM

## 2016-01-26 DIAGNOSIS — K219 Gastro-esophageal reflux disease without esophagitis: Secondary | ICD-10-CM | POA: Diagnosis not present

## 2016-01-26 DIAGNOSIS — I10 Essential (primary) hypertension: Secondary | ICD-10-CM | POA: Insufficient documentation

## 2016-01-26 DIAGNOSIS — Z87891 Personal history of nicotine dependence: Secondary | ICD-10-CM | POA: Diagnosis not present

## 2016-01-26 DIAGNOSIS — J45909 Unspecified asthma, uncomplicated: Secondary | ICD-10-CM | POA: Diagnosis not present

## 2016-01-26 DIAGNOSIS — M069 Rheumatoid arthritis, unspecified: Secondary | ICD-10-CM

## 2016-01-26 DIAGNOSIS — Z8711 Personal history of peptic ulcer disease: Secondary | ICD-10-CM | POA: Insufficient documentation

## 2016-01-26 LAB — CBC WITH DIFFERENTIAL/PLATELET
Basophils Absolute: 0.1 10*3/uL (ref 0–0.1)
Basophils Relative: 1 %
Eosinophils Absolute: 0.2 10*3/uL (ref 0–0.7)
Eosinophils Relative: 2 %
HCT: 41.7 % (ref 35.0–47.0)
Hemoglobin: 13.1 g/dL (ref 12.0–16.0)
Lymphocytes Relative: 23 %
Lymphs Abs: 2.2 10*3/uL (ref 1.0–3.6)
MCH: 25.7 pg — ABNORMAL LOW (ref 26.0–34.0)
MCHC: 31.5 g/dL — ABNORMAL LOW (ref 32.0–36.0)
MCV: 81.5 fL (ref 80.0–100.0)
Monocytes Absolute: 1.5 10*3/uL — ABNORMAL HIGH (ref 0.2–0.9)
Monocytes Relative: 16 %
Neutro Abs: 5.6 10*3/uL (ref 1.4–6.5)
Neutrophils Relative %: 58 %
Platelets: 240 10*3/uL (ref 150–440)
RBC: 5.12 MIL/uL (ref 3.80–5.20)
RDW: 19.8 % — ABNORMAL HIGH (ref 11.5–14.5)
WBC: 9.6 10*3/uL (ref 3.6–11.0)

## 2016-01-26 LAB — COMPREHENSIVE METABOLIC PANEL
ALT: 60 U/L — ABNORMAL HIGH (ref 14–54)
AST: 43 U/L — ABNORMAL HIGH (ref 15–41)
Albumin: 3.5 g/dL (ref 3.5–5.0)
Alkaline Phosphatase: 214 U/L — ABNORMAL HIGH (ref 38–126)
Anion gap: 8 (ref 5–15)
BUN: 13 mg/dL (ref 6–20)
CO2: 31 mmol/L (ref 22–32)
Calcium: 8.3 mg/dL — ABNORMAL LOW (ref 8.9–10.3)
Chloride: 99 mmol/L — ABNORMAL LOW (ref 101–111)
Creatinine, Ser: 1.24 mg/dL — ABNORMAL HIGH (ref 0.44–1.00)
GFR calc Af Amer: 59 mL/min — ABNORMAL LOW (ref >60)
GFR calc non Af Amer: 51 mL/min — ABNORMAL LOW (ref >60)
Glucose, Bld: 165 mg/dL — ABNORMAL HIGH (ref 65–99)
Potassium: 2.7 mmol/L — CL (ref 3.5–5.1)
Sodium: 138 mmol/L (ref 135–145)
Total Bilirubin: 0.3 mg/dL (ref 0.3–1.2)
Total Protein: 6.4 g/dL — ABNORMAL LOW (ref 6.5–8.1)

## 2016-01-26 LAB — MAGNESIUM: Magnesium: 2 mg/dL (ref 1.7–2.4)

## 2016-01-26 NOTE — Progress Notes (Signed)
Complains of chronic arthritis pain.

## 2016-02-03 LAB — BCR-ABL1, CML/ALL, PCR, QUANT

## 2016-02-07 DIAGNOSIS — M329 Systemic lupus erythematosus, unspecified: Secondary | ICD-10-CM | POA: Diagnosis not present

## 2016-02-07 DIAGNOSIS — C9210 Chronic myeloid leukemia, BCR/ABL-positive, not having achieved remission: Secondary | ICD-10-CM | POA: Diagnosis not present

## 2016-02-07 DIAGNOSIS — M069 Rheumatoid arthritis, unspecified: Secondary | ICD-10-CM | POA: Diagnosis not present

## 2016-02-07 DIAGNOSIS — K5900 Constipation, unspecified: Secondary | ICD-10-CM | POA: Diagnosis not present

## 2016-02-07 DIAGNOSIS — E876 Hypokalemia: Secondary | ICD-10-CM | POA: Diagnosis not present

## 2016-03-13 DIAGNOSIS — K760 Fatty (change of) liver, not elsewhere classified: Secondary | ICD-10-CM | POA: Diagnosis not present

## 2016-03-13 DIAGNOSIS — Z7951 Long term (current) use of inhaled steroids: Secondary | ICD-10-CM | POA: Diagnosis not present

## 2016-03-13 DIAGNOSIS — R740 Nonspecific elevation of levels of transaminase and lactic acid dehydrogenase [LDH]: Secondary | ICD-10-CM | POA: Diagnosis not present

## 2016-03-13 DIAGNOSIS — M329 Systemic lupus erythematosus, unspecified: Secondary | ICD-10-CM | POA: Diagnosis not present

## 2016-03-13 DIAGNOSIS — C9210 Chronic myeloid leukemia, BCR/ABL-positive, not having achieved remission: Secondary | ICD-10-CM | POA: Diagnosis not present

## 2016-03-13 DIAGNOSIS — M1711 Unilateral primary osteoarthritis, right knee: Secondary | ICD-10-CM | POA: Diagnosis not present

## 2016-03-13 DIAGNOSIS — K219 Gastro-esophageal reflux disease without esophagitis: Secondary | ICD-10-CM | POA: Diagnosis not present

## 2016-03-13 DIAGNOSIS — M8580 Other specified disorders of bone density and structure, unspecified site: Secondary | ICD-10-CM | POA: Diagnosis not present

## 2016-03-13 DIAGNOSIS — E785 Hyperlipidemia, unspecified: Secondary | ICD-10-CM | POA: Diagnosis not present

## 2016-03-13 DIAGNOSIS — Z7952 Long term (current) use of systemic steroids: Secondary | ICD-10-CM | POA: Diagnosis not present

## 2016-03-13 DIAGNOSIS — L659 Nonscarring hair loss, unspecified: Secondary | ICD-10-CM | POA: Diagnosis not present

## 2016-03-13 DIAGNOSIS — Z7982 Long term (current) use of aspirin: Secondary | ICD-10-CM | POA: Diagnosis not present

## 2016-03-13 DIAGNOSIS — Z882 Allergy status to sulfonamides status: Secondary | ICD-10-CM | POA: Diagnosis not present

## 2016-03-13 DIAGNOSIS — R918 Other nonspecific abnormal finding of lung field: Secondary | ICD-10-CM | POA: Diagnosis not present

## 2016-03-13 DIAGNOSIS — I110 Hypertensive heart disease with heart failure: Secondary | ICD-10-CM | POA: Diagnosis not present

## 2016-03-13 DIAGNOSIS — M810 Age-related osteoporosis without current pathological fracture: Secondary | ICD-10-CM | POA: Diagnosis not present

## 2016-03-13 DIAGNOSIS — Q681 Congenital deformity of finger(s) and hand: Secondary | ICD-10-CM | POA: Diagnosis not present

## 2016-03-13 DIAGNOSIS — Z8249 Family history of ischemic heart disease and other diseases of the circulatory system: Secondary | ICD-10-CM | POA: Diagnosis not present

## 2016-03-13 DIAGNOSIS — I5030 Unspecified diastolic (congestive) heart failure: Secondary | ICD-10-CM | POA: Diagnosis not present

## 2016-03-13 DIAGNOSIS — M0579 Rheumatoid arthritis with rheumatoid factor of multiple sites without organ or systems involvement: Secondary | ICD-10-CM | POA: Diagnosis not present

## 2016-03-13 DIAGNOSIS — E119 Type 2 diabetes mellitus without complications: Secondary | ICD-10-CM | POA: Diagnosis not present

## 2016-03-13 DIAGNOSIS — Z87891 Personal history of nicotine dependence: Secondary | ICD-10-CM | POA: Diagnosis not present

## 2016-03-20 DIAGNOSIS — N390 Urinary tract infection, site not specified: Secondary | ICD-10-CM | POA: Diagnosis not present

## 2016-03-20 DIAGNOSIS — J019 Acute sinusitis, unspecified: Secondary | ICD-10-CM | POA: Diagnosis not present

## 2016-03-20 DIAGNOSIS — M329 Systemic lupus erythematosus, unspecified: Secondary | ICD-10-CM | POA: Diagnosis not present

## 2016-03-30 DIAGNOSIS — M059 Rheumatoid arthritis with rheumatoid factor, unspecified: Secondary | ICD-10-CM | POA: Diagnosis not present

## 2016-03-30 DIAGNOSIS — Z79899 Other long term (current) drug therapy: Secondary | ICD-10-CM | POA: Diagnosis not present

## 2016-04-13 DIAGNOSIS — M059 Rheumatoid arthritis with rheumatoid factor, unspecified: Secondary | ICD-10-CM | POA: Diagnosis not present

## 2016-04-26 DIAGNOSIS — R7989 Other specified abnormal findings of blood chemistry: Secondary | ICD-10-CM | POA: Diagnosis not present

## 2016-04-26 DIAGNOSIS — R110 Nausea: Secondary | ICD-10-CM | POA: Diagnosis not present

## 2016-04-26 DIAGNOSIS — R0602 Shortness of breath: Secondary | ICD-10-CM | POA: Diagnosis not present

## 2016-04-26 DIAGNOSIS — M069 Rheumatoid arthritis, unspecified: Secondary | ICD-10-CM | POA: Diagnosis not present

## 2016-04-26 DIAGNOSIS — M329 Systemic lupus erythematosus, unspecified: Secondary | ICD-10-CM | POA: Diagnosis not present

## 2016-04-26 DIAGNOSIS — Z856 Personal history of leukemia: Secondary | ICD-10-CM | POA: Diagnosis not present

## 2016-04-26 DIAGNOSIS — R609 Edema, unspecified: Secondary | ICD-10-CM | POA: Diagnosis not present

## 2016-04-26 DIAGNOSIS — R1011 Right upper quadrant pain: Secondary | ICD-10-CM | POA: Diagnosis not present

## 2016-04-26 DIAGNOSIS — C9210 Chronic myeloid leukemia, BCR/ABL-positive, not having achieved remission: Secondary | ICD-10-CM | POA: Diagnosis not present

## 2016-05-23 DIAGNOSIS — L03115 Cellulitis of right lower limb: Secondary | ICD-10-CM | POA: Diagnosis not present

## 2016-05-23 DIAGNOSIS — I10 Essential (primary) hypertension: Secondary | ICD-10-CM | POA: Diagnosis not present

## 2016-05-24 ENCOUNTER — Encounter: Payer: Self-pay | Admitting: *Deleted

## 2016-05-30 ENCOUNTER — Ambulatory Visit: Payer: Self-pay | Admitting: General Surgery

## 2016-06-01 ENCOUNTER — Encounter: Payer: Self-pay | Admitting: *Deleted

## 2016-06-09 DIAGNOSIS — M329 Systemic lupus erythematosus, unspecified: Secondary | ICD-10-CM | POA: Diagnosis not present

## 2016-06-09 DIAGNOSIS — I10 Essential (primary) hypertension: Secondary | ICD-10-CM | POA: Diagnosis not present

## 2016-06-09 DIAGNOSIS — C9210 Chronic myeloid leukemia, BCR/ABL-positive, not having achieved remission: Secondary | ICD-10-CM | POA: Diagnosis not present

## 2016-06-09 DIAGNOSIS — R601 Generalized edema: Secondary | ICD-10-CM | POA: Diagnosis not present

## 2016-06-09 DIAGNOSIS — M150 Primary generalized (osteo)arthritis: Secondary | ICD-10-CM | POA: Diagnosis not present

## 2016-06-21 DIAGNOSIS — R0602 Shortness of breath: Secondary | ICD-10-CM | POA: Diagnosis not present

## 2016-06-26 DIAGNOSIS — Z79890 Hormone replacement therapy (postmenopausal): Secondary | ICD-10-CM | POA: Diagnosis not present

## 2016-06-26 DIAGNOSIS — E785 Hyperlipidemia, unspecified: Secondary | ICD-10-CM | POA: Diagnosis not present

## 2016-06-26 DIAGNOSIS — Z87891 Personal history of nicotine dependence: Secondary | ICD-10-CM | POA: Diagnosis not present

## 2016-06-26 DIAGNOSIS — M0510 Rheumatoid lung disease with rheumatoid arthritis of unspecified site: Secondary | ICD-10-CM | POA: Diagnosis not present

## 2016-06-26 DIAGNOSIS — Z882 Allergy status to sulfonamides status: Secondary | ICD-10-CM | POA: Diagnosis not present

## 2016-06-26 DIAGNOSIS — R740 Nonspecific elevation of levels of transaminase and lactic acid dehydrogenase [LDH]: Secondary | ICD-10-CM | POA: Diagnosis not present

## 2016-06-26 DIAGNOSIS — M05741 Rheumatoid arthritis with rheumatoid factor of right hand without organ or systems involvement: Secondary | ICD-10-CM | POA: Diagnosis not present

## 2016-06-26 DIAGNOSIS — M170 Bilateral primary osteoarthritis of knee: Secondary | ICD-10-CM | POA: Diagnosis not present

## 2016-06-26 DIAGNOSIS — M329 Systemic lupus erythematosus, unspecified: Secondary | ICD-10-CM | POA: Diagnosis not present

## 2016-06-26 DIAGNOSIS — Z79891 Long term (current) use of opiate analgesic: Secondary | ICD-10-CM | POA: Diagnosis not present

## 2016-06-26 DIAGNOSIS — K760 Fatty (change of) liver, not elsewhere classified: Secondary | ICD-10-CM | POA: Diagnosis not present

## 2016-06-26 DIAGNOSIS — Z9221 Personal history of antineoplastic chemotherapy: Secondary | ICD-10-CM | POA: Diagnosis not present

## 2016-06-26 DIAGNOSIS — L640 Drug-induced androgenic alopecia: Secondary | ICD-10-CM | POA: Diagnosis not present

## 2016-06-26 DIAGNOSIS — I110 Hypertensive heart disease with heart failure: Secondary | ICD-10-CM | POA: Diagnosis not present

## 2016-06-26 DIAGNOSIS — Z888 Allergy status to other drugs, medicaments and biological substances status: Secondary | ICD-10-CM | POA: Diagnosis not present

## 2016-06-26 DIAGNOSIS — Z886 Allergy status to analgesic agent status: Secondary | ICD-10-CM | POA: Diagnosis not present

## 2016-06-26 DIAGNOSIS — Z803 Family history of malignant neoplasm of breast: Secondary | ICD-10-CM | POA: Diagnosis not present

## 2016-06-26 DIAGNOSIS — Z8249 Family history of ischemic heart disease and other diseases of the circulatory system: Secondary | ICD-10-CM | POA: Diagnosis not present

## 2016-06-26 DIAGNOSIS — M810 Age-related osteoporosis without current pathological fracture: Secondary | ICD-10-CM | POA: Diagnosis not present

## 2016-06-26 DIAGNOSIS — I509 Heart failure, unspecified: Secondary | ICD-10-CM | POA: Diagnosis not present

## 2016-06-26 DIAGNOSIS — M069 Rheumatoid arthritis, unspecified: Secondary | ICD-10-CM | POA: Diagnosis not present

## 2016-06-26 DIAGNOSIS — T451X5A Adverse effect of antineoplastic and immunosuppressive drugs, initial encounter: Secondary | ICD-10-CM | POA: Diagnosis not present

## 2016-06-26 DIAGNOSIS — C9210 Chronic myeloid leukemia, BCR/ABL-positive, not having achieved remission: Secondary | ICD-10-CM | POA: Diagnosis not present

## 2016-06-26 DIAGNOSIS — R7989 Other specified abnormal findings of blood chemistry: Secondary | ICD-10-CM | POA: Diagnosis not present

## 2016-06-26 DIAGNOSIS — E119 Type 2 diabetes mellitus without complications: Secondary | ICD-10-CM | POA: Diagnosis not present

## 2016-06-26 DIAGNOSIS — M21949 Unspecified acquired deformity of hand, unspecified hand: Secondary | ICD-10-CM | POA: Diagnosis not present

## 2016-06-26 DIAGNOSIS — Z7982 Long term (current) use of aspirin: Secondary | ICD-10-CM | POA: Diagnosis not present

## 2016-06-28 DIAGNOSIS — Z1231 Encounter for screening mammogram for malignant neoplasm of breast: Secondary | ICD-10-CM | POA: Diagnosis not present

## 2016-06-29 DIAGNOSIS — R7989 Other specified abnormal findings of blood chemistry: Secondary | ICD-10-CM | POA: Diagnosis not present

## 2016-06-29 DIAGNOSIS — R945 Abnormal results of liver function studies: Secondary | ICD-10-CM | POA: Diagnosis not present

## 2016-06-29 DIAGNOSIS — Z79899 Other long term (current) drug therapy: Secondary | ICD-10-CM | POA: Diagnosis not present

## 2016-06-29 DIAGNOSIS — R748 Abnormal levels of other serum enzymes: Secondary | ICD-10-CM | POA: Diagnosis not present

## 2016-07-09 NOTE — Progress Notes (Signed)
[place]  Telephone:(336) [phone] Fax:(336) [phone]  ID: Laura Chen OB: 1965/12/04  MR#: [id number]  ZYS#:[id number]  Patient Care Team: Lavera Guise, MD as PCP - General (Internal Medicine) Lavera Guise, MD (Internal Medicine) Christene Lye, MD (General Surgery)  CHIEF COMPLAINT: CML  INTERVAL HISTORY: Patient returns to clinic today for repeat laboratory work and further evaluation.  Patient admits she discontinued Iclusig approximately one month ago and feels significantly improved. She denies any fevers, night sweats, or weight loss.  She has chronic pain from her arthritis. She denies any chest pain or shortness of breath. She has a fair appetite. She has no nausea, vomiting, constipation, or diarrhea.  She has no urinary complaints. She offers no further specific complaints today.  REVIEW OF SYSTEMS:   Review of Systems  Constitutional: Negative.  Negative for fever, malaise/fatigue and weight loss.  Respiratory: Negative.  Negative for cough and shortness of breath.   Cardiovascular: Negative.  Negative for chest pain and leg swelling.  Gastrointestinal: Negative.  Negative for abdominal pain.  Genitourinary: Negative.   Musculoskeletal: Positive for back pain, joint pain and neck pain.  Neurological: Negative.  Negative for focal weakness and weakness.  Psychiatric/Behavioral: Negative.  The patient is not nervous/anxious.     As per HPI. Otherwise, a complete review of systems is negative.  PAST MEDICAL HISTORY: Past Medical History:  Diagnosis Date  . Asthma   . CML (chronic myeloid leukemia) ([place]) 2011   (Dr. Alyse Low) stopped gleevec 2/2 side effects  . GERD (gastroesophageal reflux disease)   . History of shingles   . History of syphilis 1990   s/p treatment  . Hypertension   . Leukemia ([place])   . Osteopenia    due to prednisone use, was on reclast  . PUD (peptic ulcer disease)   . Rheumatoid arthritis(714.0) 1990s   Jacoud's arthropathy, previously treated with MTX, TNFa (remicade, enbrel, humira, orencia, rituximab, and actemra)  . SLE (systemic lupus erythematosus) ([place]) 09/2009   Rheum-Dr. Joan Mayans  . Thyroid goiter    Endo-Dr. Eddie Dibbles    PAST SURGICAL HISTORY: Past Surgical History:  Procedure Laterality Date  . DEXA  2010   osteopenia  . MYOMECTOMY  2008  . right ankle AFO  2011  . TRANSTHORACIC ECHOCARDIOGRAM  10/3233   nl systolic/diastolic fxn, EF 57%, mild MR, normal PA pressures  . US/HSG  05/2010   possible endometrial polyp, rec rpt 8 wks continue loestrin 24 ([place])    FAMILY HISTORY Family History  Problem Relation Age of Onset  . Hypertension Mother   . Hypertension Unknown        Aunt  . Heart attack Maternal Grandmother   . Coronary artery disease Maternal Grandmother        ADVANCED DIRECTIVES:    HEALTH MAINTENANCE: Social History  Substance Use Topics  . Smoking status: Former Smoker    Packs/day: 0.30    Types: Cigarettes  . Smokeless tobacco: Not on file     Comment: 6 cigarettes/day  . Alcohol use No    Allergies  Allergen Reactions  . Ibuprofen Swelling  . Sulfa Antibiotics Rash and Other (See Comments)    Reaction:  Burning of feet   . Tape Itching and Rash  . Thiazide-Type Diuretics Rash    Current Outpatient Prescriptions  Medication Sig Dispense Refill  . aspirin EC 81 MG tablet Take 81 mg by mouth every evening.     . DULoxetine (CYMBALTA) 20 MG  capsule Take 20 mg by mouth daily.     . ICLUSIG 15 MG tablet Take 15 mg by mouth at bedtime.     . Linaclotide (LINZESS) 290 MCG CAPS capsule Take 290 mcg by mouth daily.    . medroxyPROGESTERone (PROVERA) 10 MG tablet Take 10 mg by mouth every evening.    . metolazone (ZAROXOLYN) 10 MG tablet Take 10-20 mg by mouth daily. Pt alternates between one and two tablets on a every other day basis.    Marland Kitchen omeprazole (PRILOSEC) 20 MG capsule Take 20 mg by mouth daily.     . Oxycodone HCl 10 MG TABS Take  10 mg by mouth 3 (three) times daily as needed (for pain).    . potassium chloride SA (K-DUR,KLOR-CON) 20 MEQ tablet Take 2 tablets (40 mEq total) by mouth 2 (two) times daily. 120 tablet 0  . predniSONE (DELTASONE) 5 MG tablet Take 5 mg by mouth daily with breakfast.    . rosuvastatin (CRESTOR) 5 MG tablet Take 5 mg by mouth daily with supper.    . traMADol (ULTRAM) 50 MG tablet Take 100 mg by mouth 2 (two) times daily.    . Vitamin D, Ergocalciferol, (DRISDOL) 50000 units CAPS capsule Take 50,000 Units by mouth every 7 (seven) days. Pt takes on Thursday.     No current facility-administered medications for this visit.     OBJECTIVE: Vitals:   07/11/16 1131  BP: 124/81  Pulse: 92  Resp: 20  Temp: 98.6 F (37 C)     Body mass index is 40.49 kg/m.    ECOG FS:1 - Symptomatic but completely ambulatory  General: Well-developed, well-nourished, no acute distress. Eyes: Pink conjunctiva, anicteric sclera. Lungs: Clear to auscultation bilaterally. Heart: Regular rate and rhythm. No rubs, murmurs, or gallops. Abdomen: Soft, nontender, nondistended. No organomegaly noted, normoactive bowel sounds. Musculoskeletal: No edema, cyanosis, or clubbing. Neuro: Alert, answering all questions appropriately. Cranial nerves grossly intact. Skin: No rashes or petechiae noted. Psych: Normal affect.   LAB RESULTS:  Lab Results  Component Value Date   NA 137 07/11/2016   K 2.7 (LL) 07/11/2016   CL 100 (L) 07/11/2016   CO2 30 07/11/2016   GLUCOSE 187 (H) 07/11/2016   BUN 11 07/11/2016   CREATININE 1.22 (H) 07/11/2016   CALCIUM 8.8 (L) 07/11/2016   PROT 6.3 (L) 07/11/2016   ALBUMIN 3.5 07/11/2016   AST 30 07/11/2016   ALT 26 07/11/2016   ALKPHOS 124 07/11/2016   BILITOT 0.5 07/11/2016   GFRNONAA 52 (L) 07/11/2016   GFRAA 60 (L) 07/11/2016    Lab Results  Component Value Date   WBC 9.2 07/11/2016   NEUTROABS 6.2 07/11/2016   HGB 11.7 (L) 07/11/2016   HCT 36.8 07/11/2016   MCV 79.2  (L) 07/11/2016   PLT 272 07/11/2016     STUDIES: No results found.  ASSESSMENT: CML  PLAN:    1.  CML:  Previously, patient could not tolerate Gleevec, Sprycel, Tasigna, and Bosulif.  She is able to tolerate Ponatinib (Iclusig), which she will continue with this daily or until progression of disease.  She has acknowledged the blackbox warning and risks of this medication.  Patient admits to discontinuing treatment approximately one month ago. She feels symptomatically improved. Have instructed her to continue holding treatment until the results of her BCR-ABL are finalized from today. She has follow-up at [facility] with Dr. Katina Dung in 1-2 months for further evaluation. Return to clinic in 3 months with repeat laboratory  work and further evaluation. 2.  Rheumatoid arthritis: Rituxan every 6 months per [facility].   3.  Hypertension: Blood pressure is elevated today, continue current treatment regimen. 4.  Cavitary lung lesions: Rheumatologic nodules, treatment as above. 5.  Hypokalemia: 2.7 today. Patient does not wish to have IV potassium today. Continue oral supplementation as directed .  Patient expressed understanding and was in agreement with this plan. She also understands that She can call clinic at any time with any questions, concerns, or complaints.     Lloyd Huger, MD   07/17/2016 7:00 PM

## 2016-07-11 ENCOUNTER — Inpatient Hospital Stay (HOSPITAL_BASED_OUTPATIENT_CLINIC_OR_DEPARTMENT_OTHER): Payer: Medicare Other | Admitting: Oncology

## 2016-07-11 ENCOUNTER — Inpatient Hospital Stay: Payer: Medicare Other | Attending: Oncology

## 2016-07-11 VITALS — BP 124/81 | HR 92 | Temp 98.6°F | Resp 20 | Wt 243.3 lb

## 2016-07-11 DIAGNOSIS — M069 Rheumatoid arthritis, unspecified: Secondary | ICD-10-CM | POA: Diagnosis not present

## 2016-07-11 DIAGNOSIS — I10 Essential (primary) hypertension: Secondary | ICD-10-CM | POA: Diagnosis not present

## 2016-07-11 DIAGNOSIS — R918 Other nonspecific abnormal finding of lung field: Secondary | ICD-10-CM | POA: Diagnosis not present

## 2016-07-11 DIAGNOSIS — Z79899 Other long term (current) drug therapy: Secondary | ICD-10-CM

## 2016-07-11 DIAGNOSIS — C9210 Chronic myeloid leukemia, BCR/ABL-positive, not having achieved remission: Secondary | ICD-10-CM | POA: Diagnosis not present

## 2016-07-11 DIAGNOSIS — E876 Hypokalemia: Secondary | ICD-10-CM

## 2016-07-11 DIAGNOSIS — K219 Gastro-esophageal reflux disease without esophagitis: Secondary | ICD-10-CM | POA: Insufficient documentation

## 2016-07-11 DIAGNOSIS — Z7982 Long term (current) use of aspirin: Secondary | ICD-10-CM | POA: Diagnosis not present

## 2016-07-11 DIAGNOSIS — M329 Systemic lupus erythematosus, unspecified: Secondary | ICD-10-CM | POA: Diagnosis not present

## 2016-07-11 DIAGNOSIS — J45909 Unspecified asthma, uncomplicated: Secondary | ICD-10-CM | POA: Diagnosis not present

## 2016-07-11 DIAGNOSIS — Z8711 Personal history of peptic ulcer disease: Secondary | ICD-10-CM | POA: Diagnosis not present

## 2016-07-11 DIAGNOSIS — Z87891 Personal history of nicotine dependence: Secondary | ICD-10-CM | POA: Insufficient documentation

## 2016-07-11 LAB — CBC WITH DIFFERENTIAL/PLATELET
Basophils Absolute: 0.1 10*3/uL (ref 0–0.1)
Basophils Relative: 1 %
Eosinophils Absolute: 0.1 10*3/uL (ref 0–0.7)
Eosinophils Relative: 1 %
HCT: 36.8 % (ref 35.0–47.0)
Hemoglobin: 11.7 g/dL — ABNORMAL LOW (ref 12.0–16.0)
Lymphocytes Relative: 17 %
Lymphs Abs: 1.5 10*3/uL (ref 1.0–3.6)
MCH: 25.2 pg — ABNORMAL LOW (ref 26.0–34.0)
MCHC: 31.9 g/dL — ABNORMAL LOW (ref 32.0–36.0)
MCV: 79.2 fL — ABNORMAL LOW (ref 80.0–100.0)
Monocytes Absolute: 1.3 10*3/uL — ABNORMAL HIGH (ref 0.2–0.9)
Monocytes Relative: 14 %
Neutro Abs: 6.2 10*3/uL (ref 1.4–6.5)
Neutrophils Relative %: 67 %
Platelets: 272 10*3/uL (ref 150–440)
RBC: 4.64 MIL/uL (ref 3.80–5.20)
RDW: 20.5 % — ABNORMAL HIGH (ref 11.5–14.5)
WBC: 9.2 10*3/uL (ref 3.6–11.0)

## 2016-07-11 LAB — COMPREHENSIVE METABOLIC PANEL
ALT: 26 U/L (ref 14–54)
AST: 30 U/L (ref 15–41)
Albumin: 3.5 g/dL (ref 3.5–5.0)
Alkaline Phosphatase: 124 U/L (ref 38–126)
Anion gap: 7 (ref 5–15)
BUN: 11 mg/dL (ref 6–20)
CO2: 30 mmol/L (ref 22–32)
Calcium: 8.8 mg/dL — ABNORMAL LOW (ref 8.9–10.3)
Chloride: 100 mmol/L — ABNORMAL LOW (ref 101–111)
Creatinine, Ser: 1.22 mg/dL — ABNORMAL HIGH (ref 0.44–1.00)
GFR calc Af Amer: 60 mL/min — ABNORMAL LOW (ref >60)
GFR calc non Af Amer: 52 mL/min — ABNORMAL LOW (ref >60)
Glucose, Bld: 187 mg/dL — ABNORMAL HIGH (ref 65–99)
Potassium: 2.7 mmol/L — CL (ref 3.5–5.1)
Sodium: 137 mmol/L (ref 135–145)
Total Bilirubin: 0.5 mg/dL (ref 0.3–1.2)
Total Protein: 6.3 g/dL — ABNORMAL LOW (ref 6.5–8.1)

## 2016-07-11 LAB — MAGNESIUM: Magnesium: 1.7 mg/dL (ref 1.7–2.4)

## 2016-07-11 NOTE — Progress Notes (Signed)
Patient denies any concerns today.  

## 2016-07-18 LAB — BCR-ABL1, CML/ALL, PCR, QUANT
b2a2 transcript: 0.0644 %
b3a2 transcript: 0.1418 %

## 2016-07-26 ENCOUNTER — Other Ambulatory Visit: Payer: Medicare Other

## 2016-07-26 ENCOUNTER — Telehealth: Payer: Self-pay | Admitting: *Deleted

## 2016-07-26 ENCOUNTER — Ambulatory Visit: Payer: Medicare Other | Admitting: Oncology

## 2016-07-26 NOTE — Telephone Encounter (Signed)
Call placed to patient regarding recent lab results, patient given results of BCR/ABL. Patient states she also had BCR/ABL drawn at [facility]. We will fax office notes and lab results to Dr. Katina Dung at [facility] for further recommendations regarding need for follow up and treatment. Patient is in agreement with this plan.

## 2016-08-30 ENCOUNTER — Ambulatory Visit
Admission: RE | Admit: 2016-08-30 | Discharge: 2016-08-30 | Disposition: A | Discharge status: Home / Self Care | Payer: MEDICARE | Attending: Hematology & Oncology | Admitting: Hematology & Oncology

## 2016-08-30 ENCOUNTER — Ambulatory Visit
Admission: RE | Admit: 2016-08-30 | Discharge: 2016-08-30 | Disposition: A | Discharge status: Home / Self Care | Payer: MEDICARE

## 2016-08-30 DIAGNOSIS — C9210 Chronic myeloid leukemia, BCR/ABL-positive, not having achieved remission: Secondary | ICD-10-CM | POA: Diagnosis not present

## 2016-08-30 DIAGNOSIS — R1011 Right upper quadrant pain: Secondary | ICD-10-CM | POA: Diagnosis not present

## 2016-09-01 DIAGNOSIS — E119 Type 2 diabetes mellitus without complications: Secondary | ICD-10-CM | POA: Diagnosis not present

## 2016-09-28 ENCOUNTER — Ambulatory Visit
Admission: RE | Admit: 2016-09-28 | Discharge: 2016-09-28 | Disposition: A | Discharge status: Home / Self Care | Payer: MEDICARE

## 2016-09-28 DIAGNOSIS — M059 Rheumatoid arthritis with rheumatoid factor, unspecified: Secondary | ICD-10-CM | POA: Diagnosis not present

## 2016-09-28 DIAGNOSIS — M170 Bilateral primary osteoarthritis of knee: Secondary | ICD-10-CM | POA: Diagnosis not present

## 2016-09-28 DIAGNOSIS — Z79899 Other long term (current) drug therapy: Secondary | ICD-10-CM | POA: Diagnosis not present

## 2016-09-28 DIAGNOSIS — Z7952 Long term (current) use of systemic steroids: Secondary | ICD-10-CM | POA: Diagnosis not present

## 2016-09-28 DIAGNOSIS — M810 Age-related osteoporosis without current pathological fracture: Secondary | ICD-10-CM | POA: Diagnosis not present

## 2016-09-28 DIAGNOSIS — Z7982 Long term (current) use of aspirin: Secondary | ICD-10-CM | POA: Diagnosis not present

## 2016-09-28 DIAGNOSIS — E119 Type 2 diabetes mellitus without complications: Secondary | ICD-10-CM | POA: Diagnosis not present

## 2016-09-28 DIAGNOSIS — M3219 Other organ or system involvement in systemic lupus erythematosus: Secondary | ICD-10-CM | POA: Diagnosis not present

## 2016-09-28 DIAGNOSIS — R740 Nonspecific elevation of levels of transaminase and lactic acid dehydrogenase [LDH]: Secondary | ICD-10-CM | POA: Diagnosis not present

## 2016-09-28 DIAGNOSIS — I5030 Unspecified diastolic (congestive) heart failure: Secondary | ICD-10-CM | POA: Diagnosis not present

## 2016-09-28 DIAGNOSIS — I110 Hypertensive heart disease with heart failure: Secondary | ICD-10-CM | POA: Diagnosis not present

## 2016-09-28 DIAGNOSIS — R918 Other nonspecific abnormal finding of lung field: Secondary | ICD-10-CM | POA: Diagnosis not present

## 2016-09-28 DIAGNOSIS — E785 Hyperlipidemia, unspecified: Secondary | ICD-10-CM | POA: Diagnosis not present

## 2016-09-28 DIAGNOSIS — K219 Gastro-esophageal reflux disease without esophagitis: Secondary | ICD-10-CM | POA: Diagnosis not present

## 2016-09-28 DIAGNOSIS — M329 Systemic lupus erythematosus, unspecified: Secondary | ICD-10-CM | POA: Diagnosis not present

## 2016-09-28 DIAGNOSIS — C9210 Chronic myeloid leukemia, BCR/ABL-positive, not having achieved remission: Secondary | ICD-10-CM | POA: Diagnosis not present

## 2016-09-28 MED ORDER — OXYCODONE 10 MG TABLET
ORAL_TABLET | Freq: Three times a day (TID) | ORAL | 0 refills | 0.00000 days | Status: CP | PRN
Start: 2016-09-28 — End: 2016-11-15

## 2016-10-05 ENCOUNTER — Ambulatory Visit
Admission: RE | Admit: 2016-10-05 | Discharge: 2016-10-05 | Disposition: A | Discharge status: Home / Self Care | Payer: MEDICARE

## 2016-10-05 DIAGNOSIS — M059 Rheumatoid arthritis with rheumatoid factor, unspecified: Secondary | ICD-10-CM | POA: Diagnosis not present

## 2016-10-05 DIAGNOSIS — M069 Rheumatoid arthritis, unspecified: Secondary | ICD-10-CM

## 2016-10-09 NOTE — Progress Notes (Signed)
[place]  Telephone:(336) [phone] Fax:(336) [phone]  ID: Laura Chen OB: [birth year]  MR#: [id number]  GYB#:[id number]  Patient Care Team: Lavera Guise, MD as PCP - General (Internal Medicine) Lavera Guise, MD (Internal Medicine) Christene Lye, MD (General Surgery)  CHIEF COMPLAINT: CML  INTERVAL HISTORY: Patient returns to clinic today for repeat laboratory work and further evaluation. She recently reinitiated Iclusig, but then subsequent discontinued again secondary to persistent episodes of chest pain where she felt like she is having "a heart attack". She is now not taking any medication and feels at her baseline. She denies any fevers, night sweats, or weight loss.  She has chronic pain from her arthritis. She denies any chest pain or shortness of breath. She has a fair appetite. She has no nausea, vomiting, constipation, or diarrhea.  She has no urinary complaints. She offers no further specific complaints today.  REVIEW OF SYSTEMS:   Review of Systems  Constitutional: Negative.  Negative for fever, malaise/fatigue and weight loss.  Respiratory: Negative.  Negative for cough and shortness of breath.   Cardiovascular: Negative.  Negative for chest pain and leg swelling.  Gastrointestinal: Negative.  Negative for abdominal pain.  Genitourinary: Negative.   Musculoskeletal: Positive for back pain, joint pain and neck pain.  Neurological: Negative.  Negative for focal weakness and weakness.  Psychiatric/Behavioral: Negative.  The patient is not nervous/anxious.     As per HPI. Otherwise, a complete review of systems is negative.  PAST MEDICAL HISTORY: Past Medical History:  Diagnosis Date  . Asthma   . CML (chronic myeloid leukemia) ([place]) 2011   (Dr. Alyse Low) stopped gleevec 2/2 side effects  . GERD (gastroesophageal reflux disease)   . History of shingles   . History of syphilis 1990   s/p treatment  . Hypertension   . Leukemia  ([place])   . Osteopenia    due to prednisone use, was on reclast  . PUD (peptic ulcer disease)   . Rheumatoid arthritis(714.0) 1990s   Jacoud's arthropathy, previously treated with MTX, TNFa (remicade, enbrel, humira, orencia, rituximab, and actemra)  . SLE (systemic lupus erythematosus) ([place]) 09/2009   Rheum-Dr. Joan Mayans  . Thyroid goiter    Endo-Dr. Eddie Dibbles    PAST SURGICAL HISTORY: Past Surgical History:  Procedure Laterality Date  . DEXA  2010   osteopenia  . MYOMECTOMY  2008  . right ankle AFO  2011  . TRANSTHORACIC ECHOCARDIOGRAM  09/7679   nl systolic/diastolic fxn, EF 15%, mild MR, normal PA pressures  . US/HSG  05/2010   possible endometrial polyp, rec rpt 8 wks continue loestrin 24 ([place])    FAMILY HISTORY Family History  Problem Relation Age of Onset  . Hypertension Mother   . Hypertension Unknown        Aunt  . Heart attack Maternal Grandmother   . Coronary artery disease Maternal Grandmother        ADVANCED DIRECTIVES:    HEALTH MAINTENANCE: Social History  Substance Use Topics  . Smoking status: Former Smoker    Packs/day: 0.30    Types: Cigarettes  . Smokeless tobacco: Not on file     Comment: 6 cigarettes/day  . Alcohol use No    Allergies  Allergen Reactions  . Ibuprofen Swelling  . Sulfa Antibiotics Rash and Other (See Comments)    Reaction:  Burning of feet   . Tape Itching and Rash  . Thiazide-Type Diuretics Rash    Current Outpatient Prescriptions  Medication Sig  Dispense Refill  . aspirin EC 81 MG tablet Take 81 mg by mouth every evening.     . cetirizine (ZYRTEC) 10 MG tablet Take 10 mg by mouth.    . DULoxetine (CYMBALTA) 20 MG capsule Take 20 mg by mouth daily.     . fluticasone (FLONASE) 50 MCG/ACT nasal spray     . Linaclotide (LINZESS) 290 MCG CAPS capsule Take 290 mcg by mouth daily.    . medroxyPROGESTERone (PROVERA) 10 MG tablet Take 10 mg by mouth every evening.    . metolazone (ZAROXOLYN) 10 MG tablet Take 10-20 mg by  mouth daily. Pt alternates between one and two tablets on a every other day basis.    Marland Kitchen omeprazole (PRILOSEC) 20 MG capsule Take 20 mg by mouth daily.     . Oxycodone HCl 10 MG TABS Take 10 mg by mouth 3 (three) times daily as needed (for pain).    . potassium chloride SA (K-DUR,KLOR-CON) 20 MEQ tablet Take 2 tablets (40 mEq total) by mouth 2 (two) times daily. (Patient taking differently: Take 40 mEq by mouth 2 (two) times daily. Takes 2 in the morning three at night) 120 tablet 0  . predniSONE (DELTASONE) 5 MG tablet Take 5 mg by mouth daily with breakfast.    . rosuvastatin (CRESTOR) 5 MG tablet Take 5 mg by mouth daily with supper.    . traMADol (ULTRAM) 50 MG tablet Take 100 mg by mouth 2 (two) times daily.    . Vitamin D, Ergocalciferol, (DRISDOL) 50000 units CAPS capsule Take 50,000 Units by mouth every 7 (seven) days. Pt takes on Thursday.     No current facility-administered medications for this visit.     OBJECTIVE: Vitals:   10/10/16 1455  BP: 136/81  Pulse: 86  Resp: 18  Temp: (!) 97.3 F (36.3 C)     Body mass index is 38.22 kg/m.    ECOG FS:1 - Symptomatic but completely ambulatory  General: Well-developed, well-nourished, no acute distress. Eyes: Pink conjunctiva, anicteric sclera. Lungs: Clear to auscultation bilaterally. Heart: Regular rate and rhythm. No rubs, murmurs, or gallops. Abdomen: Soft, nontender, nondistended. No organomegaly noted, normoactive bowel sounds. Musculoskeletal: No edema, cyanosis, or clubbing. Neuro: Alert, answering all questions appropriately. Cranial nerves grossly intact. Skin: No rashes or petechiae noted. Psych: Normal affect.   LAB RESULTS:  Lab Results  Component Value Date   NA 136 10/10/2016   K 2.9 (L) 10/10/2016   CL 99 (L) 10/10/2016   CO2 29 10/10/2016   GLUCOSE 115 (H) 10/10/2016   BUN 15 10/10/2016   CREATININE 1.20 (H) 10/10/2016   CALCIUM 9.2 10/10/2016   PROT 7.2 10/10/2016   ALBUMIN 4.0 10/10/2016   AST 29  10/10/2016   ALT 37 10/10/2016   ALKPHOS 150 (H) 10/10/2016   BILITOT 0.4 10/10/2016   GFRNONAA 53 (L) 10/10/2016   GFRAA >60 10/10/2016    Lab Results  Component Value Date   WBC 11.4 (H) 10/10/2016   NEUTROABS 8.1 (H) 10/10/2016   HGB 12.3 10/10/2016   HCT 38.9 10/10/2016   MCV 76.2 (L) 10/10/2016   PLT 315 10/10/2016     STUDIES: No results found.  ASSESSMENT: CML  PLAN:    1.  CML:  Previously, patient could not tolerate Gleevec, Sprycel, Tasigna, and Bosulif.  She is now having side effects to Ponatinib (Iclusig) as well. She has an appointment in the next week with [facility] to discuss treatment options which possibly would include combination therapy. Return  to clinic in 2 months with repeat laboratory work and further evaluation. 2.  Rheumatoid arthritis: Rituxan every 6 months per [facility].   3.  Hypertension: Blood pressure is elevated today, continue current treatment regimen. 4.  Cavitary lung lesions: Rheumatologic nodules, treatment as above. 5.  Hypokalemia: 2.9 today. Patient does not wish to have IV potassium today. Continue oral supplementation as directed.  Approximately 30 minutes spent in discussion of which greater than 50% was consultation.  Patient expressed understanding and was in agreement with this plan. She also understands that She can call clinic at any time with any questions, concerns, or complaints.     Lloyd Huger, MD   10/12/2016 10:57 AM

## 2016-10-10 ENCOUNTER — Inpatient Hospital Stay: Payer: Medicare Other

## 2016-10-10 ENCOUNTER — Inpatient Hospital Stay: Payer: Medicare Other | Attending: Oncology | Admitting: Oncology

## 2016-10-10 VITALS — BP 136/81 | HR 86 | Temp 97.3°F | Resp 18 | Wt 229.7 lb

## 2016-10-10 DIAGNOSIS — M069 Rheumatoid arthritis, unspecified: Secondary | ICD-10-CM | POA: Insufficient documentation

## 2016-10-10 DIAGNOSIS — M329 Systemic lupus erythematosus, unspecified: Secondary | ICD-10-CM | POA: Diagnosis not present

## 2016-10-10 DIAGNOSIS — E876 Hypokalemia: Secondary | ICD-10-CM | POA: Diagnosis not present

## 2016-10-10 DIAGNOSIS — Z79899 Other long term (current) drug therapy: Secondary | ICD-10-CM | POA: Insufficient documentation

## 2016-10-10 DIAGNOSIS — C9210 Chronic myeloid leukemia, BCR/ABL-positive, not having achieved remission: Secondary | ICD-10-CM

## 2016-10-10 DIAGNOSIS — M8580 Other specified disorders of bone density and structure, unspecified site: Secondary | ICD-10-CM | POA: Diagnosis not present

## 2016-10-10 DIAGNOSIS — Z87891 Personal history of nicotine dependence: Secondary | ICD-10-CM | POA: Diagnosis not present

## 2016-10-10 DIAGNOSIS — K279 Peptic ulcer, site unspecified, unspecified as acute or chronic, without hemorrhage or perforation: Secondary | ICD-10-CM | POA: Diagnosis not present

## 2016-10-10 DIAGNOSIS — K219 Gastro-esophageal reflux disease without esophagitis: Secondary | ICD-10-CM | POA: Insufficient documentation

## 2016-10-10 DIAGNOSIS — I10 Essential (primary) hypertension: Secondary | ICD-10-CM

## 2016-10-10 DIAGNOSIS — E049 Nontoxic goiter, unspecified: Secondary | ICD-10-CM | POA: Diagnosis not present

## 2016-10-10 LAB — CBC WITH DIFFERENTIAL/PLATELET
Basophils Absolute: 0.1 10*3/uL (ref 0–0.1)
Basophils Relative: 1 %
Eosinophils Absolute: 0.1 10*3/uL (ref 0–0.7)
Eosinophils Relative: 1 %
HCT: 38.9 % (ref 35.0–47.0)
Hemoglobin: 12.3 g/dL (ref 12.0–16.0)
Lymphocytes Relative: 16 %
Lymphs Abs: 1.8 10*3/uL (ref 1.0–3.6)
MCH: 24.1 pg — ABNORMAL LOW (ref 26.0–34.0)
MCHC: 31.6 g/dL — ABNORMAL LOW (ref 32.0–36.0)
MCV: 76.2 fL — ABNORMAL LOW (ref 80.0–100.0)
Monocytes Absolute: 1.2 10*3/uL — ABNORMAL HIGH (ref 0.2–0.9)
Monocytes Relative: 11 %
Neutro Abs: 8.1 10*3/uL — ABNORMAL HIGH (ref 1.4–6.5)
Neutrophils Relative %: 71 %
Platelets: 315 10*3/uL (ref 150–440)
RBC: 5.1 MIL/uL (ref 3.80–5.20)
RDW: 21.1 % — ABNORMAL HIGH (ref 11.5–14.5)
WBC: 11.4 10*3/uL — ABNORMAL HIGH (ref 3.6–11.0)

## 2016-10-10 LAB — COMPREHENSIVE METABOLIC PANEL
ALT: 37 U/L (ref 14–54)
AST: 29 U/L (ref 15–41)
Albumin: 4 g/dL (ref 3.5–5.0)
Alkaline Phosphatase: 150 U/L — ABNORMAL HIGH (ref 38–126)
Anion gap: 8 (ref 5–15)
BUN: 15 mg/dL (ref 6–20)
CO2: 29 mmol/L (ref 22–32)
Calcium: 9.2 mg/dL (ref 8.9–10.3)
Chloride: 99 mmol/L — ABNORMAL LOW (ref 101–111)
Creatinine, Ser: 1.2 mg/dL — ABNORMAL HIGH (ref 0.44–1.00)
GFR calc Af Amer: >60 mL/min (ref >60)
GFR calc non Af Amer: 53 mL/min — ABNORMAL LOW (ref >60)
Glucose, Bld: 115 mg/dL — ABNORMAL HIGH (ref 65–99)
Potassium: 2.9 mmol/L — ABNORMAL LOW (ref 3.5–5.1)
Sodium: 136 mmol/L (ref 135–145)
Total Bilirubin: 0.4 mg/dL (ref 0.3–1.2)
Total Protein: 7.2 g/dL (ref 6.5–8.1)

## 2016-10-10 LAB — MAGNESIUM: Magnesium: 1.7 mg/dL (ref 1.7–2.4)

## 2016-10-10 NOTE — Progress Notes (Signed)
Patient is here for follow up, she is doing well no complaints other than aching in her joints.

## 2016-10-12 ENCOUNTER — Ambulatory Visit
Admission: RE | Admit: 2016-10-12 | Discharge: 2016-10-12 | Disposition: A | Discharge status: Home / Self Care | Payer: MEDICARE

## 2016-10-12 ENCOUNTER — Ambulatory Visit
Admission: RE | Admit: 2016-10-12 | Discharge: 2016-10-12 | Disposition: A | Discharge status: Home / Self Care | Payer: MEDICARE | Attending: Hematology & Oncology | Admitting: Hematology & Oncology

## 2016-10-12 DIAGNOSIS — R0602 Shortness of breath: Secondary | ICD-10-CM | POA: Diagnosis not present

## 2016-10-12 DIAGNOSIS — Z780 Asymptomatic menopausal state: Secondary | ICD-10-CM | POA: Diagnosis not present

## 2016-10-12 DIAGNOSIS — R6884 Jaw pain: Secondary | ICD-10-CM | POA: Diagnosis not present

## 2016-10-12 DIAGNOSIS — C9210 Chronic myeloid leukemia, BCR/ABL-positive, not having achieved remission: Secondary | ICD-10-CM | POA: Diagnosis not present

## 2016-10-12 DIAGNOSIS — R0789 Other chest pain: Secondary | ICD-10-CM | POA: Diagnosis not present

## 2016-10-17 LAB — BCR-ABL1, CML/ALL, PCR, QUANT
b2a2 transcript: 1.044 %
b3a2 transcript: 0.6813 %

## 2016-10-25 ENCOUNTER — Ambulatory Visit
Admission: RE | Admit: 2016-10-25 | Discharge: 2016-10-25 | Disposition: A | Discharge status: Home / Self Care | Payer: MEDICARE

## 2016-10-25 ENCOUNTER — Ambulatory Visit
Admission: RE | Admit: 2016-10-25 | Discharge: 2016-10-25 | Disposition: A | Discharge status: Home / Self Care | Payer: MEDICARE | Attending: Oncology | Admitting: Oncology

## 2016-10-25 DIAGNOSIS — R0602 Shortness of breath: Secondary | ICD-10-CM | POA: Diagnosis not present

## 2016-10-25 DIAGNOSIS — R188 Other ascites: Secondary | ICD-10-CM | POA: Diagnosis not present

## 2016-10-25 DIAGNOSIS — Z780 Asymptomatic menopausal state: Secondary | ICD-10-CM | POA: Diagnosis not present

## 2016-10-25 DIAGNOSIS — T50995D Adverse effect of other drugs, medicaments and biological substances, subsequent encounter: Secondary | ICD-10-CM | POA: Diagnosis not present

## 2016-10-25 DIAGNOSIS — R109 Unspecified abdominal pain: Secondary | ICD-10-CM | POA: Diagnosis not present

## 2016-10-25 DIAGNOSIS — Z7982 Long term (current) use of aspirin: Secondary | ICD-10-CM | POA: Diagnosis not present

## 2016-10-25 DIAGNOSIS — Z882 Allergy status to sulfonamides status: Secondary | ICD-10-CM | POA: Diagnosis not present

## 2016-10-25 DIAGNOSIS — R079 Chest pain, unspecified: Secondary | ICD-10-CM | POA: Diagnosis not present

## 2016-10-25 DIAGNOSIS — Z79899 Other long term (current) drug therapy: Secondary | ICD-10-CM | POA: Diagnosis not present

## 2016-10-25 DIAGNOSIS — Z7952 Long term (current) use of systemic steroids: Secondary | ICD-10-CM | POA: Diagnosis not present

## 2016-10-25 DIAGNOSIS — C9210 Chronic myeloid leukemia, BCR/ABL-positive, not having achieved remission: Secondary | ICD-10-CM | POA: Diagnosis not present

## 2016-10-25 DIAGNOSIS — Z888 Allergy status to other drugs, medicaments and biological substances status: Secondary | ICD-10-CM | POA: Diagnosis not present

## 2016-10-25 DIAGNOSIS — Z886 Allergy status to analgesic agent status: Secondary | ICD-10-CM | POA: Diagnosis not present

## 2016-10-25 DIAGNOSIS — Z5181 Encounter for therapeutic drug level monitoring: Secondary | ICD-10-CM | POA: Diagnosis not present

## 2016-10-25 DIAGNOSIS — Z7951 Long term (current) use of inhaled steroids: Secondary | ICD-10-CM | POA: Diagnosis not present

## 2016-10-26 ENCOUNTER — Ambulatory Visit
Admission: RE | Admit: 2016-10-26 | Discharge: 2016-10-26 | Disposition: A | Discharge status: Home / Self Care | Payer: MEDICARE

## 2016-10-26 DIAGNOSIS — M059 Rheumatoid arthritis with rheumatoid factor, unspecified: Secondary | ICD-10-CM | POA: Diagnosis not present

## 2016-10-26 DIAGNOSIS — M069 Rheumatoid arthritis, unspecified: Secondary | ICD-10-CM

## 2016-10-31 MED ORDER — BACLOFEN 10 MG TABLET
ORAL_TABLET | Freq: Three times a day (TID) | ORAL | 1 refills | 0 days | Status: CP
Start: 2016-10-31 — End: 2016-10-31

## 2016-11-02 MED ORDER — BACLOFEN 10 MG TABLET
ORAL_TABLET | 1 refills | 0 days | Status: CP
Start: 2016-11-02 — End: 2017-01-24

## 2016-11-08 ENCOUNTER — Ambulatory Visit
Admission: RE | Admit: 2016-11-08 | Discharge: 2016-11-08 | Disposition: A | Discharge status: Home / Self Care | Payer: MEDICARE | Attending: Hematology & Oncology | Admitting: Hematology & Oncology

## 2016-11-08 ENCOUNTER — Ambulatory Visit
Admission: RE | Admit: 2016-11-08 | Discharge: 2016-11-08 | Disposition: A | Discharge status: Home / Self Care | Payer: MEDICARE

## 2016-11-08 DIAGNOSIS — C9210 Chronic myeloid leukemia, BCR/ABL-positive, not having achieved remission: Secondary | ICD-10-CM | POA: Diagnosis not present

## 2016-11-08 DIAGNOSIS — M351 Other overlap syndromes: Secondary | ICD-10-CM | POA: Diagnosis not present

## 2016-11-08 MED ORDER — TRAZODONE 50 MG TABLET
ORAL_TABLET | Freq: Every evening | ORAL | 1 refills | 0.00000 days | Status: CP
Start: 2016-11-08 — End: 2016-12-06

## 2016-11-13 DIAGNOSIS — Z0001 Encounter for general adult medical examination with abnormal findings: Secondary | ICD-10-CM | POA: Diagnosis not present

## 2016-11-13 DIAGNOSIS — I2510 Atherosclerotic heart disease of native coronary artery without angina pectoris: Secondary | ICD-10-CM | POA: Diagnosis not present

## 2016-11-13 DIAGNOSIS — J309 Allergic rhinitis, unspecified: Secondary | ICD-10-CM | POA: Diagnosis not present

## 2016-11-13 DIAGNOSIS — E782 Mixed hyperlipidemia: Secondary | ICD-10-CM | POA: Diagnosis not present

## 2016-11-13 DIAGNOSIS — C9210 Chronic myeloid leukemia, BCR/ABL-positive, not having achieved remission: Secondary | ICD-10-CM | POA: Diagnosis not present

## 2016-11-15 MED ORDER — OXYCODONE 10 MG TABLET
ORAL_TABLET | Freq: Three times a day (TID) | ORAL | 0 refills | 0 days | Status: CP | PRN
Start: 2016-11-15 — End: 2017-01-24

## 2016-11-15 MED ORDER — PREDNISONE 5 MG TABLET
ORAL_TABLET | 3 refills | 0 days | Status: CP
Start: 2016-11-15 — End: 2017-08-29

## 2016-11-29 DIAGNOSIS — Z23 Encounter for immunization: Secondary | ICD-10-CM | POA: Diagnosis not present

## 2016-12-05 ENCOUNTER — Ambulatory Visit: Payer: Medicare Other | Admitting: Oncology

## 2016-12-05 ENCOUNTER — Other Ambulatory Visit: Payer: Medicare Other

## 2016-12-05 NOTE — Progress Notes (Deleted)
[place]  [phone] [phone]  ID: Laura Chen OB: 12/26/1965  MR#: [id number]  PFX#:[id number]  Patient Care Team: Lavera Guise, MD as PCP - General (Internal Medicine) Lavera Guise, MD (Internal Medicine) Christene Lye, MD (General Surgery)  CHIEF COMPLAINT: CML  INTERVAL HISTORY: Patient returns to clinic today for repeat laboratory work and further evaluation. She recently reinitiated Iclusig, but then subsequent discontinued again secondary to persistent episodes of chest pain where she felt like she is having "a heart attack". She is now not taking any medication and feels at her baseline. She denies any fevers, night sweats, or weight loss.  She has chronic pain from her arthritis. She denies any chest pain or shortness of breath. She has a fair appetite. She has no nausea, vomiting, constipation, or diarrhea.  She has no urinary complaints. She offers no further specific complaints today.  REVIEW OF SYSTEMS:   Review of Systems  Constitutional: Negative.  Negative for fever, malaise/fatigue and weight loss.  Respiratory: Negative.  Negative for cough and shortness of breath.   Cardiovascular: Negative.  Negative for chest pain and leg swelling.  Gastrointestinal: Negative.  Negative for abdominal pain.  Genitourinary: Negative.   Musculoskeletal: Positive for back pain, joint pain and neck pain.  Neurological: Negative.  Negative for focal weakness and weakness.  Psychiatric/Behavioral: Negative.  The patient is not nervous/anxious.     As per HPI. Otherwise, a complete review of systems is negative.  PAST MEDICAL HISTORY: Past Medical History:  Diagnosis Date  . Asthma   . CML (chronic myeloid leukemia) ([place]) 2011   (Dr. Alyse Low) stopped gleevec 2/2 side effects  . GERD (gastroesophageal reflux disease)   . History of shingles   . History of syphilis 1990   s/p treatment  . Hypertension   . Leukemia  ([place])   . Osteopenia    due to prednisone use, was on reclast  . PUD (peptic ulcer disease)   . Rheumatoid arthritis(714.0) 1990s   Jacoud's arthropathy, previously treated with MTX, TNFa (remicade, enbrel, humira, orencia, rituximab, and actemra)  . SLE (systemic lupus erythematosus) ([place]) 09/2009   Rheum-Dr. Joan Mayans  . Thyroid goiter    Endo-Dr. Eddie Dibbles    PAST SURGICAL HISTORY: Past Surgical History:  Procedure Laterality Date  . DEXA  2010   osteopenia  . MYOMECTOMY  2008  . right ankle AFO  2011  . TRANSTHORACIC ECHOCARDIOGRAM  04/2990   nl systolic/diastolic fxn, EF 42%, mild MR, normal PA pressures  . US/HSG  05/2010   possible endometrial polyp, rec rpt 8 wks continue loestrin 24 ([place])    FAMILY HISTORY Family History  Problem Relation Age of Onset  . Hypertension Mother   . Hypertension Unknown        Aunt  . Heart attack Maternal Grandmother   . Coronary artery disease Maternal Grandmother        ADVANCED DIRECTIVES:    HEALTH MAINTENANCE: Social History  Substance Use Topics  . Smoking status: Former Smoker    Packs/day: 0.30    Types: Cigarettes  . Smokeless tobacco: Not on file     Comment: 6 cigarettes/day  . Alcohol use No    Allergies  Allergen Reactions  . Ibuprofen Swelling  . Sulfa Antibiotics Rash and Other (See Comments)    Reaction:  Burning of feet   . Tape Itching and Rash  . Thiazide-Type Diuretics Rash    Current Outpatient Prescriptions  Medication Sig  Dispense Refill  . aspirin EC 81 MG tablet Take 81 mg by mouth every evening.     . cetirizine (ZYRTEC) 10 MG tablet Take 10 mg by mouth.    . DULoxetine (CYMBALTA) 20 MG capsule Take 20 mg by mouth daily.     . fluticasone (FLONASE) 50 MCG/ACT nasal spray     . Linaclotide (LINZESS) 290 MCG CAPS capsule Take 290 mcg by mouth daily.    . medroxyPROGESTERone (PROVERA) 10 MG tablet Take 10 mg by mouth every evening.    . metolazone (ZAROXOLYN) 10 MG tablet Take 10-20 mg by  mouth daily. Pt alternates between one and two tablets on a every other day basis.    Marland Kitchen omeprazole (PRILOSEC) 20 MG capsule Take 20 mg by mouth daily.     . Oxycodone HCl 10 MG TABS Take 10 mg by mouth 3 (three) times daily as needed (for pain).    . potassium chloride SA (K-DUR,KLOR-CON) 20 MEQ tablet Take 2 tablets (40 mEq total) by mouth 2 (two) times daily. (Patient taking differently: Take 40 mEq by mouth 2 (two) times daily. Takes 2 in the morning three at night) 120 tablet 0  . predniSONE (DELTASONE) 5 MG tablet Take 5 mg by mouth daily with breakfast.    . rosuvastatin (CRESTOR) 5 MG tablet Take 5 mg by mouth daily with supper.    . traMADol (ULTRAM) 50 MG tablet Take 100 mg by mouth 2 (two) times daily.    . Vitamin D, Ergocalciferol, (DRISDOL) 50000 units CAPS capsule Take 50,000 Units by mouth every 7 (seven) days. Pt takes on Thursday.     No current facility-administered medications for this visit.     OBJECTIVE: There were no vitals filed for this visit.   There is no height or weight on file to calculate BMI.    ECOG FS:1 - Symptomatic but completely ambulatory  General: Well-developed, well-nourished, no acute distress. Eyes: Pink conjunctiva, anicteric sclera. Lungs: Clear to auscultation bilaterally. Heart: Regular rate and rhythm. No rubs, murmurs, or gallops. Abdomen: Soft, nontender, nondistended. No organomegaly noted, normoactive bowel sounds. Musculoskeletal: No edema, cyanosis, or clubbing. Neuro: Alert, answering all questions appropriately. Cranial nerves grossly intact. Skin: No rashes or petechiae noted. Psych: Normal affect.   LAB RESULTS:  Lab Results  Component Value Date   NA 136 10/10/2016   K 2.9 (L) 10/10/2016   CL 99 (L) 10/10/2016   CO2 29 10/10/2016   GLUCOSE 115 (H) 10/10/2016   BUN 15 10/10/2016   CREATININE 1.20 (H) 10/10/2016   CALCIUM 9.2 10/10/2016   PROT 7.2 10/10/2016   ALBUMIN 4.0 10/10/2016   AST 29 10/10/2016   ALT 37  10/10/2016   ALKPHOS 150 (H) 10/10/2016   BILITOT 0.4 10/10/2016   GFRNONAA 53 (L) 10/10/2016   GFRAA >60 10/10/2016    Lab Results  Component Value Date   WBC 11.4 (H) 10/10/2016   NEUTROABS 8.1 (H) 10/10/2016   HGB 12.3 10/10/2016   HCT 38.9 10/10/2016   MCV 76.2 (L) 10/10/2016   PLT 315 10/10/2016     STUDIES: No results found.  ASSESSMENT: CML  PLAN:    1.  CML:  Previously, patient could not tolerate Gleevec, Sprycel, Tasigna, and Bosulif.  She is now having side effects to Ponatinib (Iclusig) as well. She has an appointment in the next week with [facility] to discuss treatment options which possibly would include combination therapy. Return to clinic in 2 months with repeat laboratory work and  further evaluation. 2.  Rheumatoid arthritis: Rituxan every 6 months per [facility].   3.  Hypertension: Blood pressure is elevated today, continue current treatment regimen. 4.  Cavitary lung lesions: Rheumatologic nodules, treatment as above. 5.  Hypokalemia: 2.9 today. Patient does not wish to have IV potassium today. Continue oral supplementation as directed.  Approximately 30 minutes spent in discussion of which greater than 50% was consultation.  Patient expressed understanding and was in agreement with this plan. She also understands that She can call clinic at any time with any questions, concerns, or complaints.     Lloyd Huger, MD   12/05/2016 10:40 PM

## 2016-12-06 ENCOUNTER — Ambulatory Visit
Admission: RE | Admit: 2016-12-06 | Discharge: 2016-12-06 | Disposition: A | Discharge status: Home / Self Care | Payer: MEDICARE | Attending: Hematology & Oncology | Admitting: Hematology & Oncology

## 2016-12-06 ENCOUNTER — Ambulatory Visit
Admission: RE | Admit: 2016-12-06 | Discharge: 2016-12-06 | Disposition: A | Discharge status: Home / Self Care | Payer: MEDICARE

## 2016-12-06 DIAGNOSIS — C9210 Chronic myeloid leukemia, BCR/ABL-positive, not having achieved remission: Secondary | ICD-10-CM | POA: Diagnosis not present

## 2016-12-06 DIAGNOSIS — M25531 Pain in right wrist: Secondary | ICD-10-CM | POA: Diagnosis not present

## 2016-12-06 DIAGNOSIS — M25532 Pain in left wrist: Secondary | ICD-10-CM | POA: Diagnosis not present

## 2016-12-06 DIAGNOSIS — M549 Dorsalgia, unspecified: Secondary | ICD-10-CM | POA: Diagnosis not present

## 2016-12-06 DIAGNOSIS — R079 Chest pain, unspecified: Secondary | ICD-10-CM | POA: Diagnosis not present

## 2016-12-07 ENCOUNTER — Inpatient Hospital Stay: Payer: Medicare Other | Admitting: Oncology

## 2016-12-07 ENCOUNTER — Inpatient Hospital Stay: Payer: Medicare Other

## 2016-12-12 ENCOUNTER — Ambulatory Visit
Admission: RE | Admit: 2016-12-12 | Discharge: 2016-12-12 | Disposition: A | Discharge status: Home / Self Care | Payer: MEDICARE | Attending: Cardiovascular Disease | Admitting: Cardiovascular Disease

## 2016-12-12 ENCOUNTER — Ambulatory Visit
Admission: RE | Admit: 2016-12-12 | Discharge: 2016-12-12 | Disposition: A | Discharge status: Home / Self Care | Payer: MEDICARE

## 2016-12-12 DIAGNOSIS — R609 Edema, unspecified: Secondary | ICD-10-CM | POA: Diagnosis not present

## 2016-12-12 DIAGNOSIS — I110 Hypertensive heart disease with heart failure: Secondary | ICD-10-CM | POA: Diagnosis not present

## 2016-12-12 DIAGNOSIS — Z87891 Personal history of nicotine dependence: Secondary | ICD-10-CM | POA: Diagnosis not present

## 2016-12-12 DIAGNOSIS — R0789 Other chest pain: Secondary | ICD-10-CM | POA: Insufficient documentation

## 2016-12-12 DIAGNOSIS — M329 Systemic lupus erythematosus, unspecified: Secondary | ICD-10-CM | POA: Diagnosis not present

## 2016-12-12 DIAGNOSIS — Z7982 Long term (current) use of aspirin: Secondary | ICD-10-CM | POA: Diagnosis not present

## 2016-12-12 DIAGNOSIS — Z7952 Long term (current) use of systemic steroids: Secondary | ICD-10-CM | POA: Diagnosis not present

## 2016-12-12 DIAGNOSIS — M810 Age-related osteoporosis without current pathological fracture: Secondary | ICD-10-CM | POA: Diagnosis not present

## 2016-12-12 DIAGNOSIS — E119 Type 2 diabetes mellitus without complications: Secondary | ICD-10-CM | POA: Diagnosis not present

## 2016-12-12 DIAGNOSIS — C9210 Chronic myeloid leukemia, BCR/ABL-positive, not having achieved remission: Secondary | ICD-10-CM | POA: Diagnosis not present

## 2016-12-12 DIAGNOSIS — I5030 Unspecified diastolic (congestive) heart failure: Secondary | ICD-10-CM | POA: Diagnosis not present

## 2016-12-12 DIAGNOSIS — Z882 Allergy status to sulfonamides status: Secondary | ICD-10-CM | POA: Diagnosis not present

## 2016-12-12 DIAGNOSIS — E785 Hyperlipidemia, unspecified: Secondary | ICD-10-CM | POA: Diagnosis not present

## 2016-12-12 DIAGNOSIS — R079 Chest pain, unspecified: Secondary | ICD-10-CM | POA: Diagnosis not present

## 2016-12-12 DIAGNOSIS — L930 Discoid lupus erythematosus: Secondary | ICD-10-CM | POA: Diagnosis not present

## 2016-12-12 DIAGNOSIS — K219 Gastro-esophageal reflux disease without esophagitis: Secondary | ICD-10-CM | POA: Diagnosis not present

## 2016-12-12 DIAGNOSIS — M069 Rheumatoid arthritis, unspecified: Secondary | ICD-10-CM | POA: Diagnosis not present

## 2016-12-12 MED ORDER — CHLORTHALIDONE 25 MG TABLET
ORAL_TABLET | Freq: Every day | ORAL | 6 refills | 0 days | Status: CP
Start: 2016-12-12 — End: 2017-02-26

## 2016-12-25 ENCOUNTER — Ambulatory Visit
Admission: RE | Admit: 2016-12-25 | Discharge: 2016-12-25 | Disposition: A | Discharge status: Home / Self Care | Payer: MEDICARE

## 2016-12-25 DIAGNOSIS — R079 Chest pain, unspecified: Secondary | ICD-10-CM | POA: Diagnosis not present

## 2016-12-25 DIAGNOSIS — R931 Abnormal findings on diagnostic imaging of heart and coronary circulation: Secondary | ICD-10-CM | POA: Diagnosis not present

## 2016-12-25 DIAGNOSIS — I10 Essential (primary) hypertension: Secondary | ICD-10-CM | POA: Diagnosis not present

## 2017-01-15 ENCOUNTER — Ambulatory Visit
Admission: RE | Admit: 2017-01-15 | Discharge: 2017-01-15 | Disposition: A | Discharge status: Home / Self Care | Payer: MEDICARE | Attending: Obstetrics & Gynecology

## 2017-01-15 DIAGNOSIS — C9210 Chronic myeloid leukemia, BCR/ABL-positive, not having achieved remission: Secondary | ICD-10-CM | POA: Diagnosis not present

## 2017-01-15 DIAGNOSIS — B379 Candidiasis, unspecified: Secondary | ICD-10-CM | POA: Diagnosis not present

## 2017-01-15 DIAGNOSIS — N939 Abnormal uterine and vaginal bleeding, unspecified: Principal | ICD-10-CM

## 2017-01-15 MED ORDER — NYSTATIN 100,000 UNIT/GRAM TOPICAL POWDER
0 refills | 0 days | Status: CP
Start: 2017-01-15 — End: 2018-01-15

## 2017-01-23 MED ORDER — PONATINIB 15 MG TABLET
ORAL_TABLET | Freq: Every day | ORAL | 11 refills | 0 days | Status: CP
Start: 2017-01-23 — End: 2018-02-07

## 2017-01-24 MED ORDER — OXYCODONE 10 MG TABLET
ORAL_TABLET | Freq: Three times a day (TID) | ORAL | 0 refills | 0 days | Status: CP | PRN
Start: 2017-01-24 — End: 2017-02-07

## 2017-01-24 MED ORDER — BACLOFEN 10 MG TABLET
ORAL_TABLET | 2 refills | 0 days | Status: CP
Start: 2017-01-24 — End: 2017-09-11

## 2017-02-07 ENCOUNTER — Ambulatory Visit
Admission: RE | Admit: 2017-02-07 | Discharge: 2017-02-07 | Disposition: A | Discharge status: Home / Self Care | Payer: MEDICARE | Attending: Rheumatology | Admitting: Rheumatology

## 2017-02-07 ENCOUNTER — Ambulatory Visit
Admission: RE | Admit: 2017-02-07 | Discharge: 2017-02-07 | Disposition: A | Discharge status: Home / Self Care | Payer: MEDICARE

## 2017-02-07 DIAGNOSIS — M0630 Rheumatoid nodule, unspecified site: Secondary | ICD-10-CM | POA: Diagnosis not present

## 2017-02-07 DIAGNOSIS — K219 Gastro-esophageal reflux disease without esophagitis: Secondary | ICD-10-CM | POA: Diagnosis not present

## 2017-02-07 DIAGNOSIS — I5032 Chronic diastolic (congestive) heart failure: Secondary | ICD-10-CM | POA: Diagnosis not present

## 2017-02-07 DIAGNOSIS — Z882 Allergy status to sulfonamides status: Secondary | ICD-10-CM | POA: Diagnosis not present

## 2017-02-07 DIAGNOSIS — R748 Abnormal levels of other serum enzymes: Secondary | ICD-10-CM | POA: Diagnosis not present

## 2017-02-07 DIAGNOSIS — M329 Systemic lupus erythematosus, unspecified: Secondary | ICD-10-CM | POA: Diagnosis not present

## 2017-02-07 DIAGNOSIS — M1990 Unspecified osteoarthritis, unspecified site: Secondary | ICD-10-CM | POA: Diagnosis not present

## 2017-02-07 DIAGNOSIS — M170 Bilateral primary osteoarthritis of knee: Secondary | ICD-10-CM | POA: Diagnosis not present

## 2017-02-07 DIAGNOSIS — E1136 Type 2 diabetes mellitus with diabetic cataract: Secondary | ICD-10-CM | POA: Diagnosis not present

## 2017-02-07 DIAGNOSIS — Z7951 Long term (current) use of inhaled steroids: Secondary | ICD-10-CM | POA: Diagnosis not present

## 2017-02-07 DIAGNOSIS — M810 Age-related osteoporosis without current pathological fracture: Secondary | ICD-10-CM | POA: Diagnosis not present

## 2017-02-07 DIAGNOSIS — C9210 Chronic myeloid leukemia, BCR/ABL-positive, not having achieved remission: Secondary | ICD-10-CM | POA: Diagnosis not present

## 2017-02-07 DIAGNOSIS — Z8249 Family history of ischemic heart disease and other diseases of the circulatory system: Secondary | ICD-10-CM | POA: Diagnosis not present

## 2017-02-07 DIAGNOSIS — Z7982 Long term (current) use of aspirin: Secondary | ICD-10-CM | POA: Diagnosis not present

## 2017-02-07 DIAGNOSIS — M069 Rheumatoid arthritis, unspecified: Secondary | ICD-10-CM | POA: Diagnosis not present

## 2017-02-07 DIAGNOSIS — Z7952 Long term (current) use of systemic steroids: Secondary | ICD-10-CM | POA: Diagnosis not present

## 2017-02-07 DIAGNOSIS — Z87891 Personal history of nicotine dependence: Secondary | ICD-10-CM | POA: Diagnosis not present

## 2017-02-07 DIAGNOSIS — Z886 Allergy status to analgesic agent status: Secondary | ICD-10-CM | POA: Diagnosis not present

## 2017-02-07 DIAGNOSIS — R740 Nonspecific elevation of levels of transaminase and lactic acid dehydrogenase [LDH]: Secondary | ICD-10-CM | POA: Diagnosis not present

## 2017-02-07 DIAGNOSIS — M059 Rheumatoid arthritis with rheumatoid factor, unspecified: Secondary | ICD-10-CM | POA: Diagnosis not present

## 2017-02-07 DIAGNOSIS — Z79899 Other long term (current) drug therapy: Secondary | ICD-10-CM | POA: Diagnosis not present

## 2017-02-07 DIAGNOSIS — I110 Hypertensive heart disease with heart failure: Secondary | ICD-10-CM | POA: Diagnosis not present

## 2017-02-07 DIAGNOSIS — Z83518 Family history of other specified eye disorder: Secondary | ICD-10-CM | POA: Diagnosis not present

## 2017-02-07 DIAGNOSIS — Z888 Allergy status to other drugs, medicaments and biological substances status: Secondary | ICD-10-CM | POA: Diagnosis not present

## 2017-02-07 DIAGNOSIS — E785 Hyperlipidemia, unspecified: Secondary | ICD-10-CM | POA: Diagnosis not present

## 2017-02-07 DIAGNOSIS — M0589 Other rheumatoid arthritis with rheumatoid factor of multiple sites: Secondary | ICD-10-CM | POA: Diagnosis not present

## 2017-02-07 DIAGNOSIS — D689 Coagulation defect, unspecified: Secondary | ICD-10-CM | POA: Diagnosis not present

## 2017-02-07 MED ORDER — OXYCODONE 10 MG TABLET
ORAL_TABLET | Freq: Three times a day (TID) | ORAL | 0 refills | 0 days | Status: CP | PRN
Start: 2017-02-07 — End: 2017-04-01

## 2017-02-07 MED ORDER — DULOXETINE 20 MG CAPSULE,DELAYED RELEASE
ORAL_CAPSULE | Freq: Every day | ORAL | 0 refills | 0 days | Status: CP
Start: 2017-02-07 — End: 2017-04-18

## 2017-02-08 ENCOUNTER — Ambulatory Visit
Admission: RE | Admit: 2017-02-08 | Discharge: 2017-02-08 | Disposition: A | Discharge status: Home / Self Care | Payer: MEDICARE

## 2017-02-08 DIAGNOSIS — N939 Abnormal uterine and vaginal bleeding, unspecified: Principal | ICD-10-CM

## 2017-02-26 MED ORDER — CHLORTHALIDONE 25 MG TABLET
ORAL_TABLET | Freq: Every day | ORAL | 1 refills | 0 days | Status: CP
Start: 2017-02-26 — End: 2017-08-14

## 2017-02-27 ENCOUNTER — Other Ambulatory Visit: Payer: Self-pay

## 2017-02-27 MED ORDER — FLUTICASONE PROPIONATE 50 MCG/ACT NA SUSP: 1.0000 | Freq: Every day | NASAL | 3 refills | Status: DC

## 2017-03-01 ENCOUNTER — Other Ambulatory Visit: Payer: Self-pay

## 2017-03-01 MED ORDER — FLUTICASONE PROPIONATE 50 MCG/ACT NA SUSP: 1.0000 | Freq: Every day | NASAL | 3 refills | Status: DC

## 2017-03-08 ENCOUNTER — Encounter
Admit: 2017-03-08 | Discharge: 2017-03-09 | Payer: MEDICARE | Attending: Hematology & Oncology | Primary: Hematology & Oncology

## 2017-03-08 ENCOUNTER — Encounter: Admit: 2017-03-08 | Discharge: 2017-03-09 | Payer: MEDICARE

## 2017-03-08 DIAGNOSIS — C9210 Chronic myeloid leukemia, BCR/ABL-positive, not having achieved remission: Secondary | ICD-10-CM | POA: Diagnosis not present

## 2017-03-08 DIAGNOSIS — M320 Drug-induced systemic lupus erythematosus: Secondary | ICD-10-CM | POA: Diagnosis not present

## 2017-03-08 DIAGNOSIS — Z79899 Other long term (current) drug therapy: Secondary | ICD-10-CM | POA: Diagnosis not present

## 2017-03-13 ENCOUNTER — Encounter: Payer: Self-pay | Admitting: Nurse Practitioner

## 2017-03-13 ENCOUNTER — Ambulatory Visit (INDEPENDENT_AMBULATORY_CARE_PROVIDER_SITE_OTHER): Payer: Medicare Other | Admitting: Nurse Practitioner

## 2017-03-13 VITALS — BP 128/85 | HR 86 | Resp 16 | Ht 65.0 in | Wt 241.0 lb

## 2017-03-13 DIAGNOSIS — B3731 Acute candidiasis of vulva and vagina: Secondary | ICD-10-CM

## 2017-03-13 DIAGNOSIS — B009 Herpesviral infection, unspecified: Secondary | ICD-10-CM

## 2017-03-13 DIAGNOSIS — B370 Candidal stomatitis: Secondary | ICD-10-CM | POA: Diagnosis not present

## 2017-03-13 DIAGNOSIS — R21 Rash and other nonspecific skin eruption: Secondary | ICD-10-CM

## 2017-03-13 DIAGNOSIS — B3781 Candidal esophagitis: Secondary | ICD-10-CM | POA: Diagnosis not present

## 2017-03-13 DIAGNOSIS — M0579 Rheumatoid arthritis with rheumatoid factor of multiple sites without organ or systems involvement: Secondary | ICD-10-CM

## 2017-03-13 DIAGNOSIS — M329 Systemic lupus erythematosus, unspecified: Secondary | ICD-10-CM | POA: Diagnosis not present

## 2017-03-13 DIAGNOSIS — B373 Candidiasis of vulva and vagina: Secondary | ICD-10-CM

## 2017-03-13 DIAGNOSIS — N760 Acute vaginitis: Secondary | ICD-10-CM | POA: Diagnosis not present

## 2017-03-13 MED ORDER — NYSTATIN 100000 UNIT/ML MT SUSP
5.0000 mL | Freq: Four times a day (QID) | OROMUCOSAL | 2 refills | Status: DC
Start: 2017-03-13 — End: 2017-06-28

## 2017-03-13 MED ORDER — CLOTRIMAZOLE-BETAMETHASONE 1-0.05 % EX CREA: 1.0000 "application " | TOPICAL_CREAM | Freq: Two times a day (BID) | CUTANEOUS | 2 refills | Status: DC

## 2017-03-13 MED ORDER — METRONIDAZOLE 0.75 % VA GEL: 1.0000 | Freq: Every day | VAGINAL | 0 refills | Status: DC

## 2017-03-13 MED ORDER — FLUCONAZOLE 150 MG PO TABS
ORAL_TABLET | ORAL | 2 refills | Status: DC
Start: 2017-03-13 — End: 2017-04-15

## 2017-03-13 MED ORDER — VALACYCLOVIR HCL 1 G PO TABS
ORAL_TABLET | ORAL | 2 refills | Status: DC
Start: 2017-03-13 — End: 2017-06-28

## 2017-03-13 NOTE — Progress Notes (Addendum)
[facility] [place], [place] [address]  Internal MEDICINE  Office Visit Note  Patient Name: Laura Chen  [id number]  [id number]  Date of Service: 03/13/2017  Chief Complaint  Patient presents with  . Vaginitis    vaginal irritation. Also has some plaques of thrush on her tongue.     Did see her GYN provider last month. A wet prep was done and there was no evidence of yeast or bacterial infection    Other  This is a new problem. The current episode started more than 1 month ago. The problem occurs constantly. The problem has been gradually worsening. Associated symptoms include arthralgias, fatigue, headaches and a rash. Pertinent negatives include no congestion, coughing or weakness. Associated symptoms comments: Plaques of thrush on tongue. oncomfortable feeling in the mouth. . Nothing aggravates the symptoms. She has tried nothing for the symptoms.    Pt is here for routine follow up.    Current Medication: Outpatient Encounter Medications as of 03/13/2017  Medication Sig  . ACCU-CHEK FASTCLIX LANCETS MISC USE TO TEST BLOOD SUGAR QD UTD  . ACCU-CHEK SMARTVIEW test strip TEST UTD ONCE D  . aspirin EC 81 MG tablet Take 81 mg by mouth every evening.   . baclofen (LIORESAL) 10 MG tablet Take 10 mg by mouth 3 (three) times daily.  . cetirizine (ZYRTEC) 10 MG tablet Take 10 mg by mouth.  . DULoxetine (CYMBALTA) 20 MG capsule Take 20 mg by mouth daily.   . fluticasone (FLONASE) 50 MCG/ACT nasal spray Place 1 spray into both nostrils daily.  . Linaclotide (LINZESS) 290 MCG CAPS capsule Take 290 mcg by mouth daily.  Marland Kitchen omeprazole (PRILOSEC) 20 MG capsule Take 20 mg by mouth daily.   . Oxycodone HCl 10 MG TABS Take 10 mg by mouth 3 (three) times daily as needed (for pain).  . ponatinib HCl (ICLUSIG) 15 MG tablet Take 30 mg by mouth daily.  . potassium chloride SA (K-DUR,KLOR-CON) 20 MEQ tablet Take 2 tablets (40 mEq total) by mouth 2 (two) times daily.  (Patient taking differently: Take 40 mEq by mouth 2 (two) times daily. Takes 2 in the morning three at night)  . predniSONE (DELTASONE) 5 MG tablet Take 5 mg by mouth daily with breakfast.  . rosuvastatin (CRESTOR) 5 MG tablet Take 5 mg by mouth daily with supper.  . traMADol (ULTRAM) 50 MG tablet Take 100 mg by mouth 2 (two) times daily.  . Vitamin D, Ergocalciferol, (DRISDOL) 50000 units CAPS capsule Take 50,000 Units by mouth every 7 (seven) days. Pt takes on Thursday.  . lidocaine (XYLOCAINE) 2 % solution APP AA 3 TO 4 TIMES D PRN P UTD  . medroxyPROGESTERone (PROVERA) 10 MG tablet Take 10 mg by mouth every evening.  . metolazone (ZAROXOLYN) 10 MG tablet Take 10-20 mg by mouth daily. Pt alternates between one and two tablets on a every other day basis.   No facility-administered encounter medications on file as of 03/13/2017.     Surgical History: Past Surgical History:  Procedure Laterality Date  . DEXA  2010   osteopenia  . MYOMECTOMY  2008  . right ankle AFO  2011  . TRANSTHORACIC ECHOCARDIOGRAM  02/7406   nl systolic/diastolic fxn, EF 14%, mild MR, normal PA pressures  . US/HSG  05/2010   possible endometrial polyp, rec rpt 8 wks continue loestrin 24 [facility])    Medical History: Past Medical History:  Diagnosis Date  . Asthma   . CML (  chronic myeloid leukemia) ([place]) 2011   (Dr. Alyse Low) stopped gleevec 2/2 side effects  . GERD (gastroesophageal reflux disease)   . History of shingles   . History of syphilis 1990   s/p treatment  . Hypertension   . Leukemia ([place])   . Osteopenia    due to prednisone use, was on reclast  . PUD (peptic ulcer disease)   . Rheumatoid arthritis(714.0) 1990s   Jacoud's arthropathy, previously treated with MTX, TNFa (remicade, enbrel, humira, orencia, rituximab, and actemra)  . SLE (systemic lupus erythematosus) ([place]) 09/2009   Rheum-Dr. Joan Mayans  . Thyroid goiter    Endo-Dr. Eddie Dibbles    Family History: Family History  Problem  Relation Age of Onset  . Heart attack Maternal Grandmother   . Coronary artery disease Maternal Grandmother   . Hypertension Mother   . Hypertension Unknown        Aunt    Social History   Socioeconomic History  . Marital status: Single    Spouse name: Not on file  . Number of children: Not on file  . Years of education: Not on file  . Highest education level: Not on file  Social Needs  . Financial resource strain: Not on file  . Food insecurity - worry: Not on file  . Food insecurity - inability: Not on file  . Transportation needs - medical: Not on file  . Transportation needs - non-medical: Not on file  Occupational History  . Occupation: Secretary/administrator at [employer]  . Smoking status: Former Smoker    Packs/day: 0.30    Types: Cigarettes  . Smokeless tobacco: Never Used  . Tobacco comment: 6 cigarettes/day  Substance and Sexual Activity  . Alcohol use: No  . Drug use: No  . Sexual activity: Not on file  Other Topics Concern  . Not on file  Social History Narrative   Caffeine: 1 cup/day coffee      Lives with mother, 1 dog      HS education      Review of Systems  Constitutional: Positive for fatigue. Negative for activity change.  HENT: Negative for congestion, postnasal drip, rhinorrhea, sinus pressure and sinus pain.   Respiratory: Negative for cough, chest tightness, shortness of breath and wheezing.   Gastrointestinal: Negative.   Endocrine: Negative for cold intolerance, heat intolerance, polydipsia, polyphagia and polyuria.  Genitourinary:       Vaginal irritation with some vaginal discharge.   Musculoskeletal: Positive for arthralgias, back pain and gait problem.  Skin: Positive for rash.       Rash under the breast and in groin. Gets worse with sweating.  Also has area on the buttocks that keeps breaking out into small blisters. Same area keeps breaking out. They will drain and scab over, then new, small blisters will occur. They itch and  are tender.   Allergic/Immunologic: Positive for environmental allergies.  Neurological: Positive for headaches. Negative for seizures and weakness.  Hematological: Negative.   Psychiatric/Behavioral: Negative.     Today's Vitals   03/13/17 0907  BP: 128/85  Pulse: 86  Resp: 16  SpO2: 97%  Weight: 241 lb (109.3 kg)  Height: 5\' 5"  (1.651 m)    Physical Exam  Constitutional: She is oriented to person, place, and time. She appears well-developed and well-nourished. No distress.  HENT:  Head: Normocephalic and atraumatic.  Mouth/Throat: Oropharynx is clear and moist. No oropharyngeal exudate.  Eyes: EOM are normal. Pupils are equal, round, and reactive to  light.  Neck: Normal range of motion. Neck supple. No JVD present. Carotid bruit is not present. No tracheal deviation present. No thyromegaly present.  Cardiovascular: Normal rate, regular rhythm and normal heart sounds. Exam reveals no gallop and no friction rub.  No murmur heard. Pulmonary/Chest: Effort normal and breath sounds normal. No respiratory distress. She has no wheezes. She has no rales.  Abdominal: Soft. Bowel sounds are normal. There is no tenderness.  Musculoskeletal:  Generalized joint pain with point tenderness present.   Lymphadenopathy:    She has no cervical adenopathy.  Neurological: She is alert and oriented to person, place, and time. No cranial nerve deficit.  Skin: Rash noted. She is not diaphoretic. There is erythema.  Red, irritated rash located under bilateral breasts and in groin area. Skin intact with no drainage present.   Psychiatric: She has a normal mood and affect. Her behavior is normal. Judgment and thought content normal.       Assessment/Plan:  1. Skin rash Likely fungal in nature. - clotrimazole-betamethasone (LOTRISONE) cream; Apply 1 application topically 2 (two) times daily.  Dispense: 45 g; Refill: 2  2. Thrush of mouth and esophagus (HCC) - fluconazole (DIFLUCAN) 150 MG  tablet; Take 1 tablet p QOD for 5 doses  Dispense: 5 tablet; Refill: 2 - nystatin (MYCOSTATIN) 100000 UNIT/ML suspension; Take 5 mLs (500,000 Units total) by mouth 4 (four) times daily.  Dispense: 400 mL; Refill: 2  3. Herpes simplex disease - valACYclovir (VALTREX) 1000 MG tablet; Take 1 tablet po BID for 5 days prn  Dispense: 20 tablet; Refill: 2  4. Acute vaginitis - metroNIDAZOLE (METROGEL) 0.75 % vaginal gel; Place 1 Applicatorful vaginally at bedtime. Use for 7 nights  Dispense: 70 g; Refill: 0  5. Vaginal candidiasis - fluconazole (DIFLUCAN) 150 MG tablet; Take 1 tablet p QOD for 5 doses  Dispense: 5 tablet; Refill: 2  6. Systemic lupus erythematosus, unspecified SLE type, unspecified organ involvement status ([place]) Continue regular visits with rheumatology as scheduled and meds as prescribed.   7. Rheumatoid arthritis involving multiple sites with positive rheumatoid factor [facility]) Rheumatology appointments as scheduled. Meds as prescribed.    General Counseling: Deonne verbalizes understanding of the findings of todays visit and agrees with plan of treatment. I have discussed any further diagnostic evaluation that may be needed or ordered today. We also reviewed her medications today. she has been encouraged to call the office with any questions or concerns that should arise related to todays visit.   This patient was seen by Leretha Pol, FNP- C in Collaboration with Dr Lavera Guise as a part of collaborative care agreement    Time spent: 25 Minutes     Dr Lavera Guise Internal medicine

## 2017-03-20 ENCOUNTER — Encounter
Admit: 2017-03-20 | Discharge: 2017-03-21 | Payer: MEDICARE | Attending: Cardiovascular Disease | Primary: Cardiovascular Disease

## 2017-03-20 DIAGNOSIS — E119 Type 2 diabetes mellitus without complications: Secondary | ICD-10-CM | POA: Diagnosis not present

## 2017-03-20 DIAGNOSIS — M329 Systemic lupus erythematosus, unspecified: Secondary | ICD-10-CM | POA: Diagnosis not present

## 2017-03-20 DIAGNOSIS — D689 Coagulation defect, unspecified: Secondary | ICD-10-CM | POA: Diagnosis not present

## 2017-03-20 DIAGNOSIS — Z888 Allergy status to other drugs, medicaments and biological substances status: Secondary | ICD-10-CM | POA: Diagnosis not present

## 2017-03-20 DIAGNOSIS — M810 Age-related osteoporosis without current pathological fracture: Secondary | ICD-10-CM | POA: Diagnosis not present

## 2017-03-20 DIAGNOSIS — I110 Hypertensive heart disease with heart failure: Secondary | ICD-10-CM | POA: Diagnosis not present

## 2017-03-20 DIAGNOSIS — Z79899 Other long term (current) drug therapy: Secondary | ICD-10-CM | POA: Diagnosis not present

## 2017-03-20 DIAGNOSIS — Z9842 Cataract extraction status, left eye: Secondary | ICD-10-CM | POA: Diagnosis not present

## 2017-03-20 DIAGNOSIS — I5032 Chronic diastolic (congestive) heart failure: Secondary | ICD-10-CM | POA: Diagnosis not present

## 2017-03-20 DIAGNOSIS — E785 Hyperlipidemia, unspecified: Secondary | ICD-10-CM | POA: Diagnosis not present

## 2017-03-20 DIAGNOSIS — M069 Rheumatoid arthritis, unspecified: Secondary | ICD-10-CM | POA: Diagnosis not present

## 2017-03-20 DIAGNOSIS — I5030 Unspecified diastolic (congestive) heart failure: Secondary | ICD-10-CM | POA: Diagnosis not present

## 2017-03-20 DIAGNOSIS — Z7982 Long term (current) use of aspirin: Secondary | ICD-10-CM | POA: Diagnosis not present

## 2017-03-20 DIAGNOSIS — C9210 Chronic myeloid leukemia, BCR/ABL-positive, not having achieved remission: Secondary | ICD-10-CM | POA: Diagnosis not present

## 2017-03-20 DIAGNOSIS — R079 Chest pain, unspecified: Secondary | ICD-10-CM | POA: Diagnosis not present

## 2017-03-20 DIAGNOSIS — D649 Anemia, unspecified: Secondary | ICD-10-CM | POA: Diagnosis not present

## 2017-03-20 DIAGNOSIS — Z961 Presence of intraocular lens: Secondary | ICD-10-CM | POA: Diagnosis not present

## 2017-03-20 DIAGNOSIS — R011 Cardiac murmur, unspecified: Secondary | ICD-10-CM | POA: Diagnosis not present

## 2017-03-20 DIAGNOSIS — Z7952 Long term (current) use of systemic steroids: Secondary | ICD-10-CM | POA: Diagnosis not present

## 2017-03-20 DIAGNOSIS — M1990 Unspecified osteoarthritis, unspecified site: Secondary | ICD-10-CM | POA: Diagnosis not present

## 2017-03-20 DIAGNOSIS — K219 Gastro-esophageal reflux disease without esophagitis: Secondary | ICD-10-CM | POA: Diagnosis not present

## 2017-03-20 DIAGNOSIS — Z79891 Long term (current) use of opiate analgesic: Secondary | ICD-10-CM | POA: Diagnosis not present

## 2017-03-20 DIAGNOSIS — Z9841 Cataract extraction status, right eye: Secondary | ICD-10-CM | POA: Diagnosis not present

## 2017-03-20 MED ORDER — ETHACRYNIC ACID 25 MG TABLET
ORAL_TABLET | Freq: Every day | ORAL | 11 refills | 0 days | Status: CP
Start: 2017-03-20 — End: 2018-04-15

## 2017-03-29 ENCOUNTER — Encounter: Admit: 2017-03-29 | Discharge: 2017-03-29 | Payer: MEDICARE

## 2017-03-29 DIAGNOSIS — C9210 Chronic myeloid leukemia, BCR/ABL-positive, not having achieved remission: Secondary | ICD-10-CM | POA: Diagnosis not present

## 2017-03-29 DIAGNOSIS — I5032 Chronic diastolic (congestive) heart failure: Secondary | ICD-10-CM

## 2017-04-01 MED ORDER — OXYCODONE 10 MG TABLET
ORAL_TABLET | Freq: Three times a day (TID) | ORAL | 0 refills | 0 days | Status: CP | PRN
Start: 2017-04-01 — End: 2017-04-23

## 2017-04-02 ENCOUNTER — Encounter
Admit: 2017-04-02 | Discharge: 2017-04-03 | Payer: MEDICARE | Attending: Obstetrics & Gynecology | Primary: Obstetrics & Gynecology

## 2017-04-02 DIAGNOSIS — B379 Candidiasis, unspecified: Secondary | ICD-10-CM | POA: Diagnosis not present

## 2017-04-02 DIAGNOSIS — N939 Abnormal uterine and vaginal bleeding, unspecified: Principal | ICD-10-CM

## 2017-04-02 MED ORDER — FLUCONAZOLE 150 MG TABLET
ORAL_TABLET | Freq: Once | ORAL | 0 refills | 0 days | Status: CP
Start: 2017-04-02 — End: 2017-04-02

## 2017-04-08 IMAGING — CR DG CHEST 2V
1 series · 2 of 2 positions shown · non-contrast
Comparison: Two-view chest x-ray a 04/19/2015.

CLINICAL DATA: Cough and congestion. Pneumonia and bronchitis.
Leukemia.

EXAM:
CHEST - 2 VIEW

[Series 1: dg chest 2 view · 0.14mm/px · 2 of 2 slices shown]
[im 1/2]
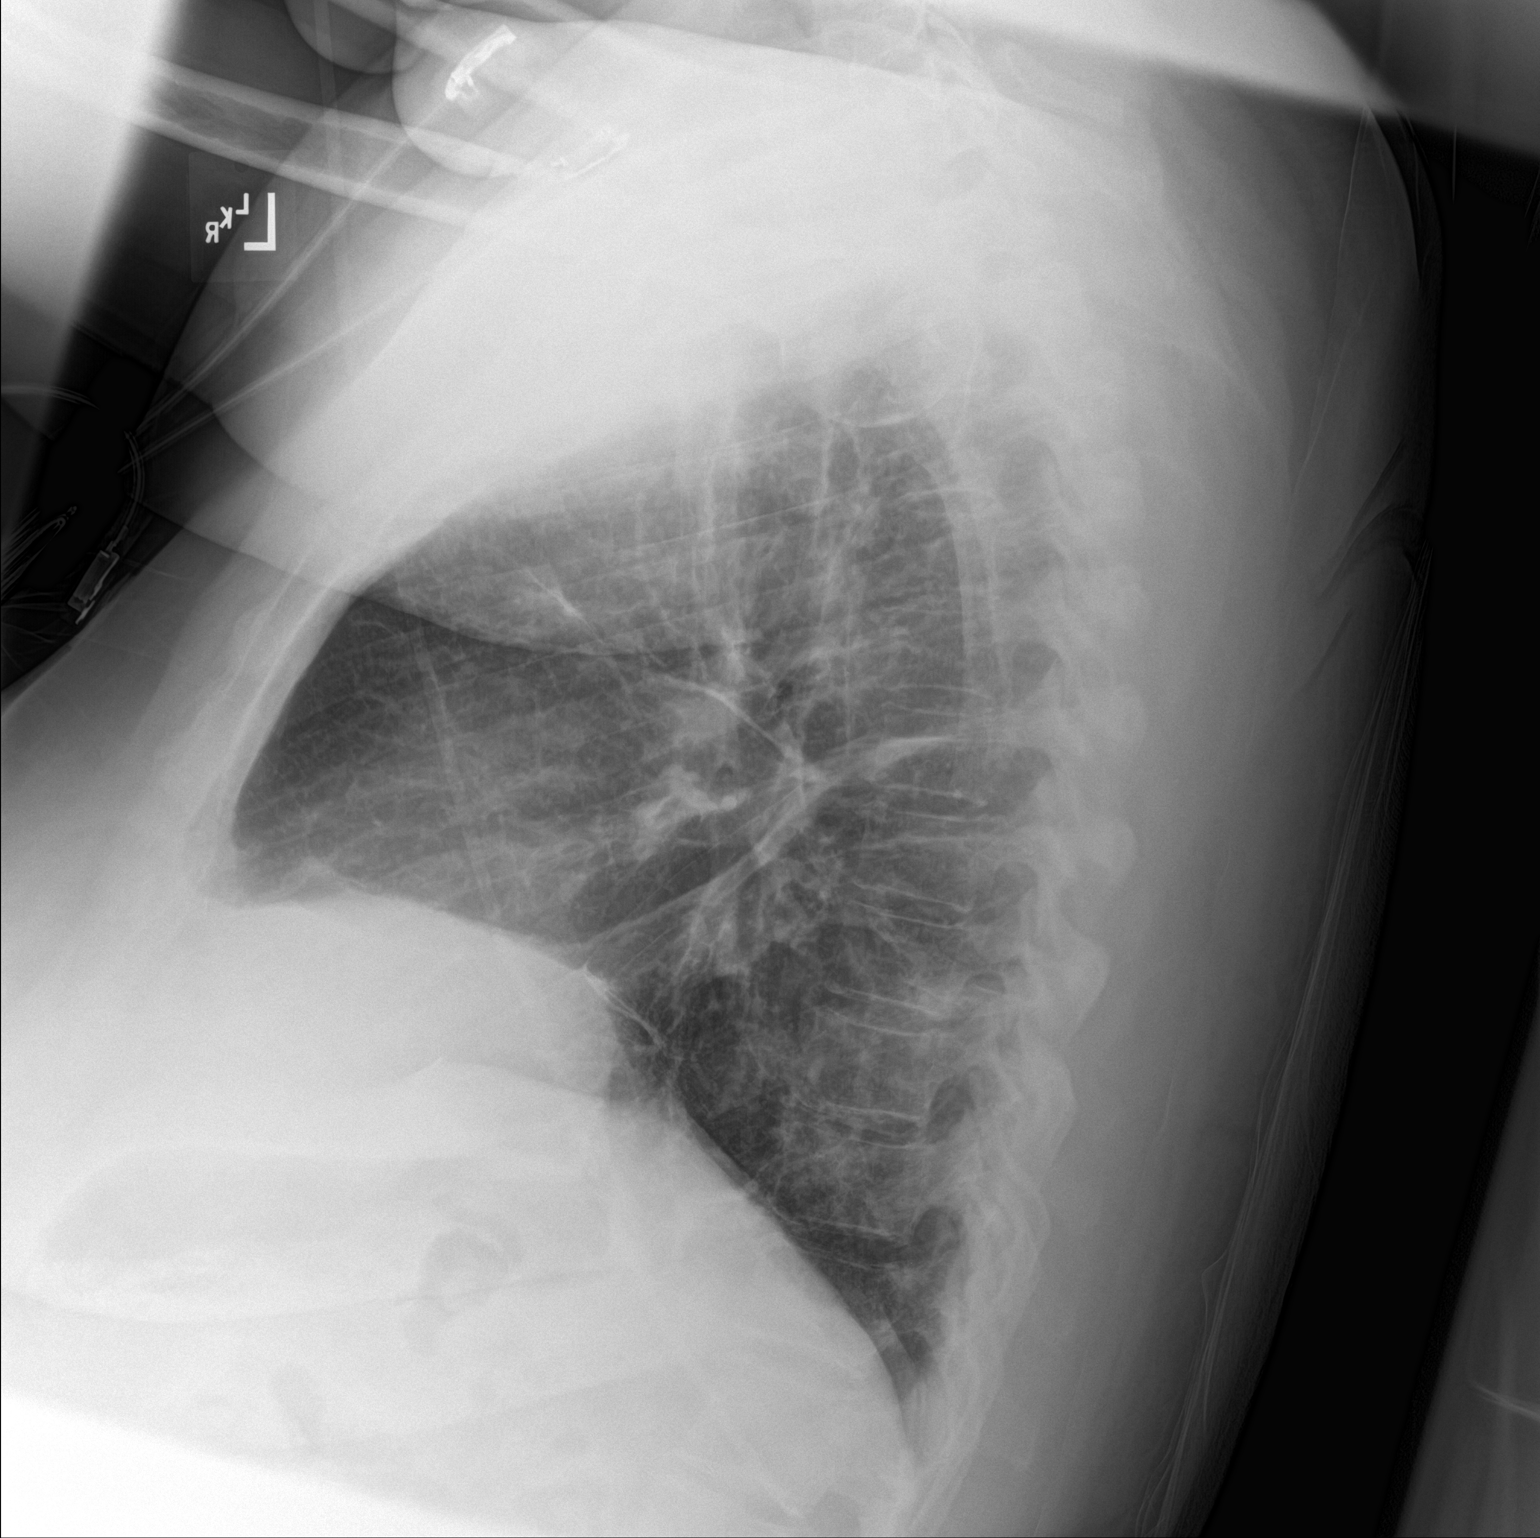
[im 2/2]
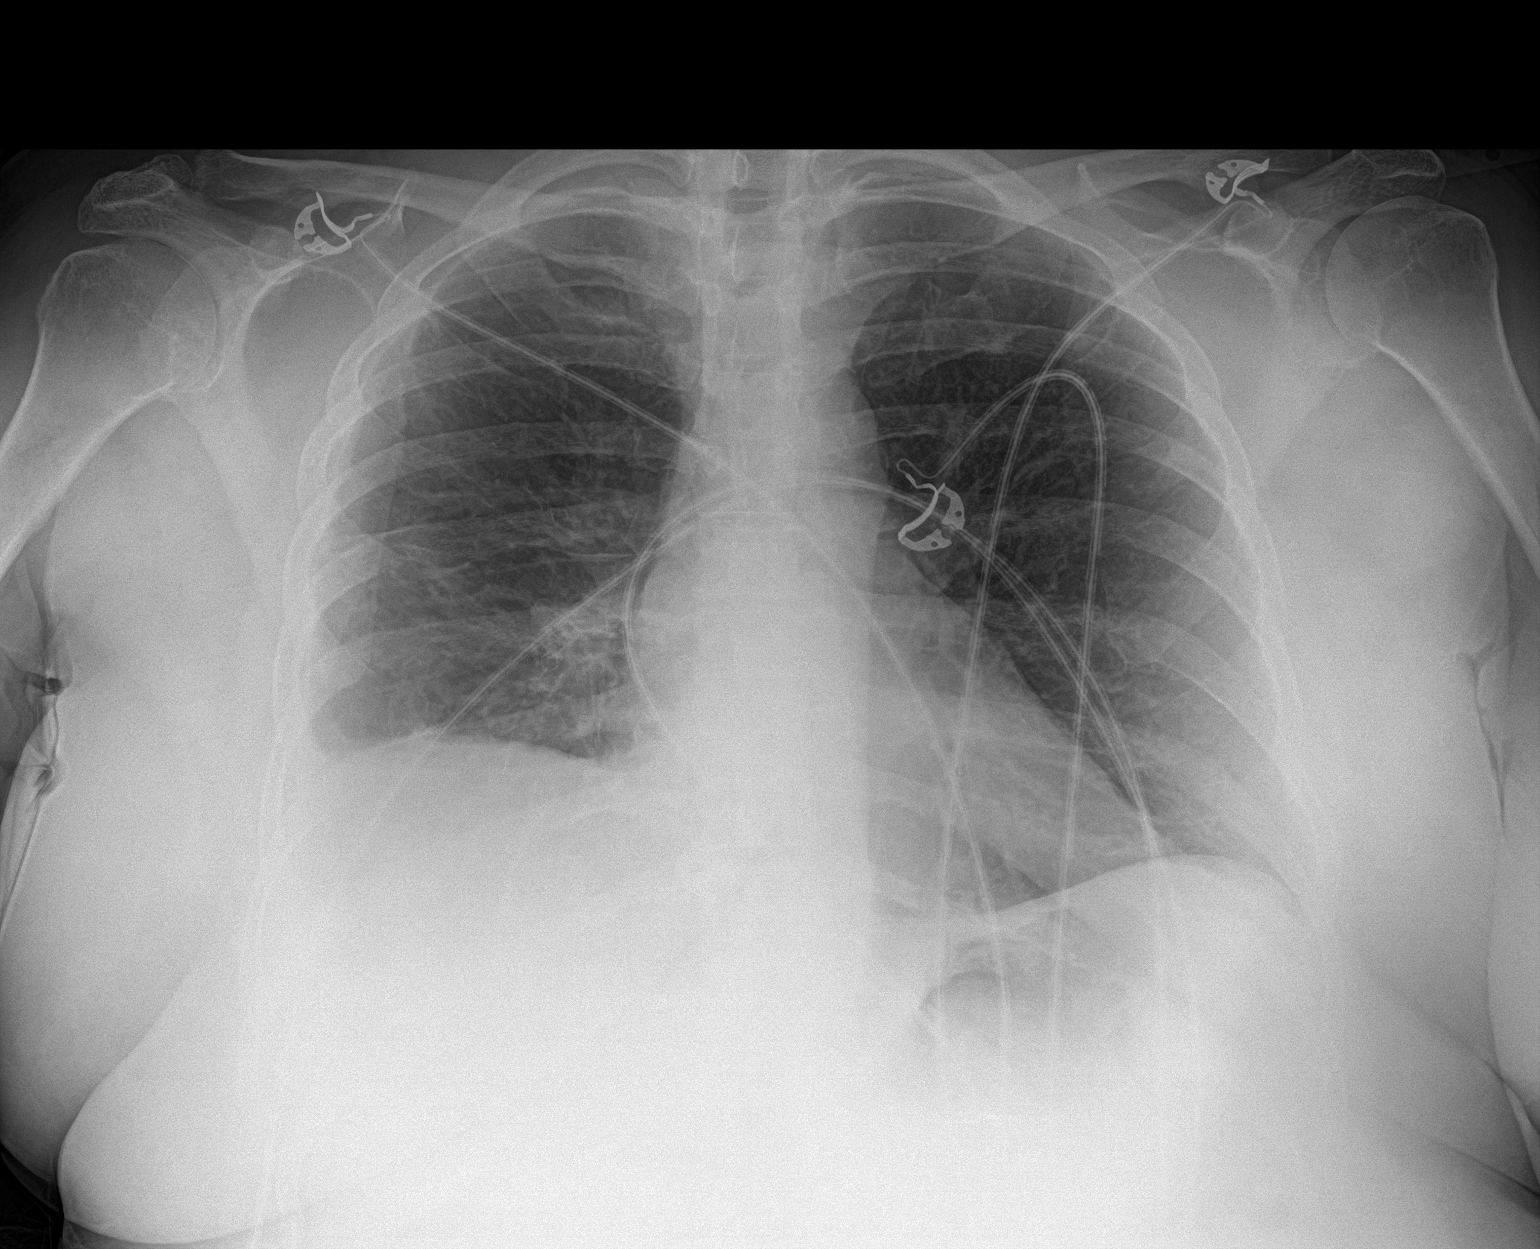

[2 of 2 positions shown; findings below may reference images not displayed]

FINDINGS: The heart size is normal. Chronic elevation of the right
hemidiaphragm is stable. Mild density projected posteriorly is
concerning for early right lower lobe infection. The left lung is
clear. The visualized soft tissues and bony thorax are unremarkable.
IMPRESSION: 1. New right lower lobe airspace disease concerning for pneumonia.
2. Stable elevation of the right hemidiaphragm.

## 2017-04-09 ENCOUNTER — Encounter: Admit: 2017-04-09 | Discharge: 2017-04-10 | Payer: MEDICARE

## 2017-04-09 DIAGNOSIS — M059 Rheumatoid arthritis with rheumatoid factor, unspecified: Secondary | ICD-10-CM | POA: Diagnosis not present

## 2017-04-09 DIAGNOSIS — Z7982 Long term (current) use of aspirin: Secondary | ICD-10-CM | POA: Diagnosis not present

## 2017-04-09 DIAGNOSIS — Z7952 Long term (current) use of systemic steroids: Secondary | ICD-10-CM | POA: Diagnosis not present

## 2017-04-09 DIAGNOSIS — Z79899 Other long term (current) drug therapy: Secondary | ICD-10-CM | POA: Diagnosis not present

## 2017-04-09 DIAGNOSIS — M069 Rheumatoid arthritis, unspecified: Secondary | ICD-10-CM

## 2017-04-11 ENCOUNTER — Telehealth: Payer: Self-pay

## 2017-04-11 NOTE — Telephone Encounter (Signed)
Pt called for sinus infection send heather reminder

## 2017-04-12 ENCOUNTER — Telehealth: Payer: Self-pay

## 2017-04-12 ENCOUNTER — Other Ambulatory Visit: Payer: Self-pay | Admitting: Nurse Practitioner

## 2017-04-12 DIAGNOSIS — J0140 Acute pansinusitis, unspecified: Secondary | ICD-10-CM

## 2017-04-12 MED ORDER — AZITHROMYCIN 250 MG PO TABS: ORAL_TABLET | ORAL | 0 refills | Status: DC

## 2017-04-12 NOTE — Telephone Encounter (Signed)
-----   Message from Ronnell Freshwater, NP sent at 04/12/2017  1:02 PM EST ----- Please let her know I sent in z-pack to take as directed. Encourage her to break from flonase and use OTC saline spray or rinse. Take OTC mucinex to help with symptoms. Thanks.  ----- Message ----- From: Corlis Hove Sent: 04/11/2017   2:59 PM To: Ronnell Freshwater, NP  Message  Received: Today  Message Contents  Ronnell Freshwater, NP  Corlis Hove    Is this for Laura Chen?  Previous Messages     ----- Message -----  From: Corlis Hove  Sent: 04/11/2017 10:03 AM  To: Ronnell Freshwater, NP   Pt called having sinus infection,headache no fever no body ache and bloody mucus

## 2017-04-12 NOTE — Progress Notes (Signed)
Sent in z-pack - take as directed for acute sinusitis

## 2017-04-12 NOTE — Telephone Encounter (Signed)
Pt advised that we send antibiotic  ?

## 2017-04-12 NOTE — Telephone Encounter (Signed)
t

## 2017-04-13 ENCOUNTER — Other Ambulatory Visit: Payer: Self-pay

## 2017-04-13 ENCOUNTER — Observation Stay
Admission: EM | Admit: 2017-04-13 | Discharge: 2017-04-15 | Disposition: A | Discharge status: Home / Self Care | Payer: Medicare Other | Attending: Internal Medicine | Admitting: Internal Medicine

## 2017-04-13 ENCOUNTER — Emergency Department: Payer: Medicare Other

## 2017-04-13 DIAGNOSIS — E876 Hypokalemia: Secondary | ICD-10-CM | POA: Insufficient documentation

## 2017-04-13 DIAGNOSIS — Z8711 Personal history of peptic ulcer disease: Secondary | ICD-10-CM | POA: Insufficient documentation

## 2017-04-13 DIAGNOSIS — E860 Dehydration: Secondary | ICD-10-CM | POA: Diagnosis not present

## 2017-04-13 DIAGNOSIS — J45909 Unspecified asthma, uncomplicated: Secondary | ICD-10-CM | POA: Diagnosis not present

## 2017-04-13 DIAGNOSIS — I129 Hypertensive chronic kidney disease with stage 1 through stage 4 chronic kidney disease, or unspecified chronic kidney disease: Secondary | ICD-10-CM | POA: Diagnosis not present

## 2017-04-13 DIAGNOSIS — K219 Gastro-esophageal reflux disease without esophagitis: Secondary | ICD-10-CM | POA: Diagnosis not present

## 2017-04-13 DIAGNOSIS — Z886 Allergy status to analgesic agent status: Secondary | ICD-10-CM | POA: Diagnosis not present

## 2017-04-13 DIAGNOSIS — J111 Influenza due to unidentified influenza virus with other respiratory manifestations: Secondary | ICD-10-CM | POA: Insufficient documentation

## 2017-04-13 DIAGNOSIS — E1122 Type 2 diabetes mellitus with diabetic chronic kidney disease: Secondary | ICD-10-CM | POA: Insufficient documentation

## 2017-04-13 DIAGNOSIS — Z8249 Family history of ischemic heart disease and other diseases of the circulatory system: Secondary | ICD-10-CM | POA: Diagnosis not present

## 2017-04-13 DIAGNOSIS — Z888 Allergy status to other drugs, medicaments and biological substances status: Secondary | ICD-10-CM | POA: Diagnosis not present

## 2017-04-13 DIAGNOSIS — A419 Sepsis, unspecified organism: Secondary | ICD-10-CM | POA: Diagnosis not present

## 2017-04-13 DIAGNOSIS — Z8619 Personal history of other infectious and parasitic diseases: Secondary | ICD-10-CM | POA: Insufficient documentation

## 2017-04-13 DIAGNOSIS — Z87891 Personal history of nicotine dependence: Secondary | ICD-10-CM | POA: Insufficient documentation

## 2017-04-13 DIAGNOSIS — E872 Acidosis, unspecified: Secondary | ICD-10-CM

## 2017-04-13 DIAGNOSIS — Z882 Allergy status to sulfonamides status: Secondary | ICD-10-CM | POA: Diagnosis not present

## 2017-04-13 DIAGNOSIS — Z7982 Long term (current) use of aspirin: Secondary | ICD-10-CM | POA: Diagnosis not present

## 2017-04-13 DIAGNOSIS — J40 Bronchitis, not specified as acute or chronic: Secondary | ICD-10-CM | POA: Diagnosis not present

## 2017-04-13 DIAGNOSIS — M329 Systemic lupus erythematosus, unspecified: Secondary | ICD-10-CM | POA: Diagnosis not present

## 2017-04-13 DIAGNOSIS — M069 Rheumatoid arthritis, unspecified: Secondary | ICD-10-CM | POA: Diagnosis not present

## 2017-04-13 DIAGNOSIS — Z79899 Other long term (current) drug therapy: Secondary | ICD-10-CM | POA: Diagnosis not present

## 2017-04-13 DIAGNOSIS — M8580 Other specified disorders of bone density and structure, unspecified site: Secondary | ICD-10-CM | POA: Diagnosis not present

## 2017-04-13 DIAGNOSIS — R000 Tachycardia, unspecified: Secondary | ICD-10-CM | POA: Diagnosis not present

## 2017-04-13 DIAGNOSIS — N179 Acute kidney failure, unspecified: Secondary | ICD-10-CM | POA: Insufficient documentation

## 2017-04-13 DIAGNOSIS — N189 Chronic kidney disease, unspecified: Secondary | ICD-10-CM | POA: Diagnosis not present

## 2017-04-13 DIAGNOSIS — E785 Hyperlipidemia, unspecified: Secondary | ICD-10-CM | POA: Insufficient documentation

## 2017-04-13 LAB — COMPREHENSIVE METABOLIC PANEL
ALT: 74 U/L — ABNORMAL HIGH (ref 14–54)
AST: 60 U/L — ABNORMAL HIGH (ref 15–41)
Albumin: 3.8 g/dL (ref 3.5–5.0)
Alkaline Phosphatase: 240 U/L — ABNORMAL HIGH (ref 38–126)
Anion gap: 17 — ABNORMAL HIGH (ref 5–15)
BUN: 20 mg/dL (ref 6–20)
CO2: 22 mmol/L (ref 22–32)
Calcium: 8.7 mg/dL — ABNORMAL LOW (ref 8.9–10.3)
Chloride: 95 mmol/L — ABNORMAL LOW (ref 101–111)
Creatinine, Ser: 1.58 mg/dL — ABNORMAL HIGH (ref 0.44–1.00)
GFR calc Af Amer: 43 mL/min — ABNORMAL LOW (ref >60)
GFR calc non Af Amer: 37 mL/min — ABNORMAL LOW (ref >60)
Glucose, Bld: 165 mg/dL — ABNORMAL HIGH (ref 65–99)
Potassium: 2.6 mmol/L — CL (ref 3.5–5.1)
Sodium: 134 mmol/L — ABNORMAL LOW (ref 135–145)
Total Bilirubin: 0.5 mg/dL (ref 0.3–1.2)
Total Protein: 7.6 g/dL (ref 6.5–8.1)

## 2017-04-13 LAB — CBC WITH DIFFERENTIAL/PLATELET
Basophils Absolute: 0 10*3/uL (ref 0–0.1)
Basophils Relative: 0 %
Eosinophils Absolute: 0 10*3/uL (ref 0–0.7)
Eosinophils Relative: 1 %
HCT: 48.9 % — ABNORMAL HIGH (ref 35.0–47.0)
Hemoglobin: 15 g/dL (ref 12.0–16.0)
Lymphocytes Relative: 20 %
Lymphs Abs: 1.6 10*3/uL (ref 1.0–3.6)
MCH: 22.7 pg — ABNORMAL LOW (ref 26.0–34.0)
MCHC: 30.7 g/dL — ABNORMAL LOW (ref 32.0–36.0)
MCV: 73.9 fL — ABNORMAL LOW (ref 80.0–100.0)
Monocytes Absolute: 1.5 10*3/uL — ABNORMAL HIGH (ref 0.2–0.9)
Monocytes Relative: 18 %
Neutro Abs: 5.1 10*3/uL (ref 1.4–6.5)
Neutrophils Relative %: 61 %
Platelets: 267 10*3/uL (ref 150–440)
RBC: 6.62 MIL/uL — ABNORMAL HIGH (ref 3.80–5.20)
RDW: 22.4 % — ABNORMAL HIGH (ref 11.5–14.5)
WBC: 8.3 10*3/uL (ref 3.6–11.0)

## 2017-04-13 LAB — TROPONIN I: Troponin I: 0.03 ng/mL (ref <0.03)

## 2017-04-13 LAB — LACTIC ACID, PLASMA: Lactic Acid, Venous: 2.7 mmol/L (ref 0.5–1.9)

## 2017-04-13 MED ORDER — SODIUM CHLORIDE 0.9 % IV BOLUS (SEPSIS)
1000.0000 mL | Freq: Once | INTRAVENOUS | Status: AC
  Administered 2017-04-13: 1000 mL via INTRAVENOUS

## 2017-04-13 MED ORDER — VANCOMYCIN HCL IN DEXTROSE 1-5 GM/200ML-% IV SOLN
1000.0000 mg | Freq: Once | INTRAVENOUS | Status: AC
  Administered 2017-04-13: 1000 mg via INTRAVENOUS
  Filled 2017-04-13: qty 200

## 2017-04-13 MED ORDER — PIPERACILLIN-TAZOBACTAM 3.375 G IVPB 30 MIN
3.3750 g | Freq: Once | INTRAVENOUS | Status: AC
  Administered 2017-04-13: 3.375 g via INTRAVENOUS
  Filled 2017-04-13: qty 50

## 2017-04-13 MED ORDER — SODIUM CHLORIDE 0.9 % IV BOLUS (SEPSIS)
500.0000 mL | Freq: Once | INTRAVENOUS | Status: AC
  Administered 2017-04-13: 500 mL via INTRAVENOUS

## 2017-04-13 MED ORDER — POTASSIUM CHLORIDE CRYS ER 20 MEQ PO TBCR
40.0000 meq | EXTENDED_RELEASE_TABLET | Freq: Once | ORAL | Status: AC
  Administered 2017-04-13: 40 meq via ORAL

## 2017-04-13 NOTE — ED Provider Notes (Signed)
[facility] Emergency Department Provider Note    None    (approximate)  I have reviewed the triage vital signs and the nursing notes.   HISTORY  Chief Complaint Dehydration    HPI Laura Chen is a 52 y.o. 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  Patient feels severely weak.  Has had fevers but no chills.  Denies any nausea vomiting or diarrhea.  States that she was given azithromycin for sinus infection.  Denies any cough.  Feels severely lightheaded and weak whenever she tries to sit up or stand up.  Denies any abdominal pain.  Past Medical History:  Diagnosis Date  . Asthma   . CML (chronic myeloid leukemia) ([place]) 2011   (Dr. Alyse Low) stopped gleevec 2/2 side effects  . GERD (gastroesophageal reflux disease)   . History of shingles   . History of syphilis 1990   s/p treatment  . Hypertension   . Leukemia ([place])   . Osteopenia    due to prednisone use, was on reclast  . PUD (peptic ulcer disease)   . Rheumatoid arthritis(714.0) 1990s   Jacoud's arthropathy, previously treated with MTX, TNFa (remicade, enbrel, humira, orencia, rituximab, and actemra)  . SLE (systemic lupus erythematosus) ([place]) 09/2009   Rheum-Dr. Joan Mayans  . Thyroid goiter    Endo-Dr. Eddie Dibbles   Family History  Problem Relation Age of Onset  . Heart attack Maternal Grandmother   . Coronary artery disease Maternal Grandmother   . Hypertension Mother   . Hypertension Unknown        Aunt   Past Surgical History:  Procedure Laterality Date  . DEXA  2010   osteopenia  . MYOMECTOMY  2008  . right ankle AFO  2011  . TRANSTHORACIC ECHOCARDIOGRAM  07/5033   nl systolic/diastolic fxn, EF 46%, mild MR, normal PA pressures  . US/HSG  05/2010   possible endometrial polyp, rec rpt 8 wks continue loestrin 24  [facility])   Patient Active Problem List   Diagnosis Date Noted  . Pneumonia 04/30/2015  . Polymenorrhea 10/28/2010  . Anemia 10/28/2010  . External otitis 10/27/2010  . Endometrial polyp 10/27/2010  . Menorrhagia 10/27/2010  . Right flank pain 10/12/2010  . Fatigue 06/23/2010  . Skin rash 06/07/2010  . Edema 06/07/2010  . Tobacco abuse 06/07/2010  . Chronic myeloid leukemia ([place]) 03/24/2010  . ASTHMA 03/24/2010  . GERD 03/24/2010  . SYSTEMIC LUPUS ERYTHEMATOSUS 03/24/2010  . Rheumatoid arthritis ([place]) 03/24/2010  . FLATULENCE ERUCTATION AND GAS PAIN 03/24/2010      Prior to Admission medications   Medication Sig Start Date End Date Taking? Authorizing Provider  aspirin EC 81 MG tablet Take 81 mg by mouth every evening.    Yes [provider]  azithromycin (ZITHROMAX) 250 MG tablet z-pack - take as directed for 5 days 04/12/17  Yes Boscia, Greer Ee, NP  baclofen (LIORESAL) 10 MG tablet Take 10 mg by mouth 3 (three) times daily.   Yes [provider]  cetirizine (ZYRTEC) 10 MG tablet Take 10 mg by mouth.   Yes [provider]  chlorthalidone (HYGROTON) 25 MG tablet Take 25 mg by mouth daily.   Yes [provider]  cholecalciferol (VITAMIN D) 1000 units tablet Take 2,000 Units by mouth daily.   Yes [provider]  DULoxetine (CYMBALTA) 20 MG  capsule Take 20 mg by mouth daily.    Yes [provider]  fluticasone (FLONASE) 50 MCG/ACT nasal spray Place 1 spray into both nostrils daily. 03/01/17  Yes Lavera Guise, MD  Linaclotide [facility]) 290 MCG CAPS capsule Take 290 mcg by mouth daily.   Yes [provider]  Oxycodone HCl 10 MG TABS Take 10 mg by mouth 3 (three) times daily as needed (for pain).   Yes [provider]  ponatinib HCl (ICLUSIG) 15 MG tablet Take 30 mg by mouth daily.   Yes [provider]  potassium chloride SA (K-DUR,KLOR-CON) 20 MEQ tablet Take 2 tablets (40 mEq total) by mouth 2  (two) times daily. Patient taking differently: Take 60 mEq by mouth 2 (two) times daily.  08/02/15  Yes Lloyd Huger, MD  predniSONE (DELTASONE) 5 MG tablet Take 5 mg by mouth daily with breakfast.   Yes [provider]  rosuvastatin (CRESTOR) 5 MG tablet Take 5 mg by mouth daily with supper.   Yes [provider]  traMADol (ULTRAM) 50 MG tablet Take 100 mg by mouth 2 (two) times daily.   Yes [provider]  ACCU-CHEK FASTCLIX LANCETS MISC USE TO TEST BLOOD SUGAR QD UTD 12/11/16   [provider]  ACCU-CHEK SMARTVIEW test strip TEST UTD ONCE D 02/14/17   [provider]  clotrimazole-betamethasone (LOTRISONE) cream Apply 1 application topically 2 (two) times daily. Patient not taking: Reported on 04/13/2017 03/13/17   Ronnell Freshwater, NP  fluconazole (DIFLUCAN) 150 MG tablet Take 1 tablet p QOD for 5 doses Patient not taking: Reported on 04/13/2017 03/13/17   Ronnell Freshwater, NP  metroNIDAZOLE (METROGEL) 0.75 % vaginal gel Place 1 Applicatorful vaginally at bedtime. Use for 7 nights Patient not taking: Reported on 04/13/2017 03/13/17   Ronnell Freshwater, NP  nystatin (MYCOSTATIN) 100000 UNIT/ML suspension Take 5 mLs (500,000 Units total) by mouth 4 (four) times daily. Patient not taking: Reported on 04/13/2017 03/13/17   Ronnell Freshwater, NP  valACYclovir (VALTREX) 1000 MG tablet Take 1 tablet po BID for 5 days prn Patient not taking: Reported on 04/13/2017 03/13/17   Ronnell Freshwater, NP    Allergies Ibuprofen; Sulfa antibiotics; Tape; and Thiazide-type diuretics    Social History Social History   Tobacco Use  . Smoking status: Former Smoker    Packs/day: 0.30    Types: Cigarettes  . Smokeless tobacco: Never Used  . Tobacco comment: 6 cigarettes/day  Substance Use Topics  . Alcohol use: No  . Drug use: No    Review of Systems Patient denies headaches, rhinorrhea, blurry vision, numbness, shortness of breath, chest pain, edema,  cough, abdominal pain, nausea, vomiting, diarrhea, dysuria, fevers, rashes or hallucinations unless otherwise stated above in HPI. ____________________________________________   PHYSICAL EXAM:  VITAL SIGNS: Vitals:   04/13/17 2300 04/13/17 2350  BP: 95/64   Pulse:    Resp: 19   Temp:  99 F (37.2 C)  SpO2:      Constitutional: ill appearing, in no acute distress. Eyes: Conjunctivae are normal.  Head: Atraumatic. Nose: No congestion/rhinnorhea. Mouth/Throat: Mucous membranes are dry   Neck: No stridor. Painless ROM.  Cardiovascular: tachycardic, regular rhythm. Grossly normal heart sounds.  Delayed cap refill Respiratory: Normal respiratory effort.  No retractions. Lungs CTAB. Gastrointestinal: Soft and nontender. No distention. No abdominal bruits. No CVA tenderness. Genitourinary: deferred Musculoskeletal: No lower extremity tenderness nor edema.  No joint effusions. Neurologic:  Normal speech and language. No gross  focal neurologic deficits are appreciated. No facial droop Skin:  Skin is warm, dry and intact. No rash noted. Psychiatric: Mood and affect are normal. Speech and behavior are normal.  ____________________________________________   LABS (all labs ordered are listed, but only abnormal results are displayed)  Results for orders placed or performed during the hospital encounter of 04/13/17 (from the past 24 hour(s))  Comprehensive metabolic panel     Status: Abnormal   Collection Time: 04/13/17  9:52 PM  Result Value Ref Range   Sodium 134 (L) 135 - 145 mmol/L   Potassium 2.6 (LL) 3.5 - 5.1 mmol/L   Chloride 95 (L) 101 - 111 mmol/L   CO2 22 22 - 32 mmol/L   Glucose, Bld 165 (H) 65 - 99 mg/dL   BUN 20 6 - 20 mg/dL   Creatinine, Ser 1.58 (H) 0.44 - 1.00 mg/dL   Calcium 8.7 (L) 8.9 - 10.3 mg/dL   Total Protein 7.6 6.5 - 8.1 g/dL   Albumin 3.8 3.5 - 5.0 g/dL   AST 60 (H) 15 - 41 U/L   ALT 74 (H) 14 - 54 U/L   Alkaline Phosphatase 240 (H) 38 - 126 U/L    Total Bilirubin 0.5 0.3 - 1.2 mg/dL   GFR calc non Af Amer 37 (L) >60 mL/min   GFR calc Af Amer 43 (L) >60 mL/min   Anion gap 17 (H) 5 - 15  Troponin I     Status: Abnormal   Collection Time: 04/13/17  9:52 PM  Result Value Ref Range   Troponin I 0.03 (HH) <0.03 ng/mL  CBC WITH DIFFERENTIAL     Status: Abnormal   Collection Time: 04/13/17  9:52 PM  Result Value Ref Range   WBC 8.3 3.6 - 11.0 K/uL   RBC 6.62 (H) 3.80 - 5.20 MIL/uL   Hemoglobin 15.0 12.0 - 16.0 g/dL   HCT 48.9 (H) 35.0 - 47.0 %   MCV 73.9 (L) 80.0 - 100.0 fL   MCH 22.7 (L) 26.0 - 34.0 pg   MCHC 30.7 (L) 32.0 - 36.0 g/dL   RDW 22.4 (H) 11.5 - 14.5 %   Platelets 267 150 - 440 K/uL   Neutrophils Relative % 61 %   Neutro Abs 5.1 1.4 - 6.5 K/uL   Lymphocytes Relative 20 %   Lymphs Abs 1.6 1.0 - 3.6 K/uL   Monocytes Relative 18 %   Monocytes Absolute 1.5 (H) 0.2 - 0.9 K/uL   Eosinophils Relative 1 %   Eosinophils Absolute 0.0 0 - 0.7 K/uL   Basophils Relative 0 %   Basophils Absolute 0.0 0 - 0.1 K/uL  Lactic acid, plasma     Status: Abnormal   Collection Time: 04/13/17 10:02 PM  Result Value Ref Range   Lactic Acid, Venous 2.7 (HH) 0.5 - 1.9 mmol/L   ____________________________________________  EKG My review and personal interpretation at Time: 22:07   Indication: tachycardia  Rate: 120  Rhythm: sinus Axis: normal  Other: normal intervals, no stemi,  ____________________________________________  RADIOLOGY  I personally reviewed all radiographic images ordered to evaluate for the above acute complaints and reviewed radiology reports and findings.  These findings were personally discussed with the patient.  Please see medical record for radiology report.  ____________________________________________   PROCEDURES  Procedure(s) performed:  .Critical Care Performed by: Merlyn Lot, MD Authorized by: Merlyn Lot, MD   Critical care provider statement:    Critical care time (minutes):  30    Critical care time  was exclusive of:  Separately billable procedures and treating other patients   Critical care was necessary to treat or prevent imminent or life-threatening deterioration of the following conditions:  Sepsis   Critical care was time spent personally by me on the following activities:  Development of treatment plan with patient or surrogate, discussions with consultants, evaluation of patient's response to treatment, examination of patient, obtaining history from patient or surrogate, ordering and performing treatments and interventions, ordering and review of laboratory studies, ordering and review of radiographic studies, pulse oximetry, re-evaluation of patient's condition and review of old charts      Critical Care performed: yes ____________________________________________   INITIAL IMPRESSION / ASSESSMENT AND PLAN / ED COURSE  Pertinent labs & imaging results that were available during my care of the patient were reviewed by me and considered in my medical decision making (see chart for details).  DDX: Bacteremia, UTI, pneumonia, flu, viral illness, enteritis, dehydration, AK I  Lelah Jerilynn Mages Hickling is a 52 y.o. who presents to the ED with symptoms as described above.  Patient with low-grade temperature tachycardic and hypotensive with evidence of elevated lactate concerning for sepsis given her complex medical history on immunosuppressive therapy with chronic steroids.  Blood work does not show leukocytosis but she does have dehydration with metabolic acidosis with lactate elevation.  Chest x-ray shows no evidence of pneumonia.  Influenza is pending.  May be secondary to dehydration for which will receive IV fluid resuscitation.  Will check urine.  Based on presentation patient will require admission for further medical management and observation.        ____________________________________________   FINAL CLINICAL IMPRESSION(S) / ED DIAGNOSES  Final diagnoses:    Dehydration  Lactic acidosis  Sepsis, due to unspecified organism [facility])      NEW MEDICATIONS STARTED DURING THIS VISIT:  New Prescriptions   No medications on file     Note:  This document was prepared using Dragon voice recognition software and may include unintentional dictation errors.    Merlyn Lot, MD 04/14/17 0005

## 2017-04-13 NOTE — ED Notes (Signed)
Report from [place], [place].

## 2017-04-13 NOTE — ED Triage Notes (Signed)
Pt arrives to ED via POV with c/o "dehydration" and lack of appetite x3 days. Pt reports being recently treated for sinus infection, and reports poor PO intake for the last few days. Pt denies N/V/D, "no body aches", no SHOB or CP reported. Pt states h/x of leukemia (being t/x with PO medications). Charge RN made aware of pt's HYPOtensive state and to be taken to ED Rm 10 immediately following Triage.

## 2017-04-14 ENCOUNTER — Other Ambulatory Visit: Payer: Self-pay

## 2017-04-14 DIAGNOSIS — N179 Acute kidney failure, unspecified: Secondary | ICD-10-CM | POA: Diagnosis not present

## 2017-04-14 DIAGNOSIS — M069 Rheumatoid arthritis, unspecified: Secondary | ICD-10-CM | POA: Diagnosis not present

## 2017-04-14 DIAGNOSIS — E876 Hypokalemia: Secondary | ICD-10-CM | POA: Diagnosis not present

## 2017-04-14 DIAGNOSIS — N189 Chronic kidney disease, unspecified: Secondary | ICD-10-CM

## 2017-04-14 LAB — BASIC METABOLIC PANEL
Anion gap: 7 (ref 5–15)
BUN: 12 mg/dL (ref 6–20)
CO2: 23 mmol/L (ref 22–32)
Calcium: 7.3 mg/dL — ABNORMAL LOW (ref 8.9–10.3)
Chloride: 106 mmol/L (ref 101–111)
Creatinine, Ser: 1.02 mg/dL — ABNORMAL HIGH (ref 0.44–1.00)
GFR calc Af Amer: >60 mL/min (ref >60)
GFR calc non Af Amer: >60 mL/min (ref >60)
Glucose, Bld: 131 mg/dL — ABNORMAL HIGH (ref 65–99)
Potassium: 3 mmol/L — ABNORMAL LOW (ref 3.5–5.1)
Sodium: 136 mmol/L (ref 135–145)

## 2017-04-14 LAB — GLUCOSE, CAPILLARY
Glucose-Capillary: 103 mg/dL — ABNORMAL HIGH (ref 65–99)
Glucose-Capillary: 120 mg/dL — ABNORMAL HIGH (ref 65–99)
Glucose-Capillary: 122 mg/dL — ABNORMAL HIGH (ref 65–99)
Glucose-Capillary: 68 mg/dL (ref 65–99)
Glucose-Capillary: 74 mg/dL (ref 65–99)

## 2017-04-14 LAB — URINALYSIS, COMPLETE (UACMP) WITH MICROSCOPIC
Bilirubin Urine: NEGATIVE
Glucose, UA: NEGATIVE mg/dL
Ketones, ur: NEGATIVE mg/dL
Nitrite: NEGATIVE
Protein, ur: NEGATIVE mg/dL
Specific Gravity, Urine: 1.008 (ref 1.005–1.030)
pH: 5 (ref 5.0–8.0)

## 2017-04-14 LAB — LACTIC ACID, PLASMA: Lactic Acid, Venous: 2 mmol/L (ref 0.5–1.9)

## 2017-04-14 LAB — PROCALCITONIN: Procalcitonin: 0.38 ng/mL

## 2017-04-14 LAB — TSH: TSH: 3.932 u[IU]/mL (ref 0.350–4.500)

## 2017-04-14 LAB — INFLUENZA PANEL BY PCR (TYPE A & B)
Influenza A By PCR: POSITIVE — AB
Influenza B By PCR: NEGATIVE

## 2017-04-14 LAB — MAGNESIUM: Magnesium: 1.8 mg/dL (ref 1.7–2.4)

## 2017-04-14 MED ORDER — TRAMADOL HCL 50 MG PO TABS
100.0000 mg | ORAL_TABLET | Freq: Two times a day (BID) | ORAL | Status: DC
  Administered 2017-04-14 – 2017-04-15 (×3): 100 mg via ORAL
  Filled 2017-04-14 (×3): qty 2

## 2017-04-14 MED ORDER — INSULIN ASPART 100 UNIT/ML ~~LOC~~ SOLN
0.0000 [IU] | Freq: Three times a day (TID) | SUBCUTANEOUS | Status: DC
  Administered 2017-04-14: 12:00:00 1 [IU] via SUBCUTANEOUS
  Filled 2017-04-14: qty 1

## 2017-04-14 MED ORDER — VANCOMYCIN HCL 10 G IV SOLR: 1250.0000 mg | INTRAVENOUS | Status: DC

## 2017-04-14 MED ORDER — ROSUVASTATIN CALCIUM 10 MG PO TABS
5.0000 mg | ORAL_TABLET | Freq: Every day | ORAL | Status: DC
  Administered 2017-04-14: 5 mg via ORAL
  Filled 2017-04-14: qty 1

## 2017-04-14 MED ORDER — GUAIFENESIN ER 600 MG PO TB12
600.0000 mg | ORAL_TABLET | Freq: Two times a day (BID) | ORAL | Status: DC
  Administered 2017-04-14 – 2017-04-15 (×3): 600 mg via ORAL
  Filled 2017-04-14 (×3): qty 1

## 2017-04-14 MED ORDER — OSELTAMIVIR PHOSPHATE 30 MG PO CAPS
30.0000 mg | ORAL_CAPSULE | Freq: Two times a day (BID) | ORAL | Status: DC
  Administered 2017-04-14 (×2): 30 mg via ORAL
  Filled 2017-04-14 (×3): qty 1

## 2017-04-14 MED ORDER — CHLORTHALIDONE 25 MG PO TABS
25.0000 mg | ORAL_TABLET | Freq: Every day | ORAL | Status: DC
  Administered 2017-04-14: 25 mg via ORAL
  Filled 2017-04-14 (×2): qty 1

## 2017-04-14 MED ORDER — ASPIRIN EC 81 MG PO TBEC
81.0000 mg | DELAYED_RELEASE_TABLET | Freq: Every evening | ORAL | Status: DC
  Administered 2017-04-14: 18:00:00 81 mg via ORAL
  Filled 2017-04-14: qty 1

## 2017-04-14 MED ORDER — POTASSIUM CHLORIDE IN NACL 40-0.9 MEQ/L-% IV SOLN
INTRAVENOUS | Status: DC
  Administered 2017-04-14 – 2017-04-15 (×4): 125 mL/h via INTRAVENOUS
  Filled 2017-04-14 (×6): qty 1000

## 2017-04-14 MED ORDER — FLUTICASONE PROPIONATE 50 MCG/ACT NA SUSP
1.0000 | Freq: Every day | NASAL | Status: DC
  Administered 2017-04-15: 1 via NASAL
  Filled 2017-04-14: qty 16

## 2017-04-14 MED ORDER — POTASSIUM CHLORIDE CRYS ER 20 MEQ PO TBCR
40.0000 meq | EXTENDED_RELEASE_TABLET | Freq: Two times a day (BID) | ORAL | Status: DC
  Administered 2017-04-14 – 2017-04-15 (×3): 40 meq via ORAL
  Filled 2017-04-14 (×3): qty 2

## 2017-04-14 MED ORDER — ACETAMINOPHEN 650 MG RE SUPP: 650.0000 mg | Freq: Four times a day (QID) | RECTAL | Status: DC | PRN

## 2017-04-14 MED ORDER — DOCUSATE SODIUM 100 MG PO CAPS
100.0000 mg | ORAL_CAPSULE | Freq: Two times a day (BID) | ORAL | Status: DC
  Filled 2017-04-14 (×3): qty 1

## 2017-04-14 MED ORDER — INSULIN ASPART 100 UNIT/ML ~~LOC~~ SOLN: 0.0000 [IU] | Freq: Every day | SUBCUTANEOUS | Status: DC

## 2017-04-14 MED ORDER — MAGIC MOUTHWASH
5.0000 mL | Freq: Four times a day (QID) | ORAL | Status: DC
  Administered 2017-04-14 (×3): 5 mL via ORAL
  Filled 2017-04-14 (×4): qty 10

## 2017-04-14 MED ORDER — ACETAMINOPHEN 325 MG PO TABS: 650.0000 mg | ORAL_TABLET | Freq: Four times a day (QID) | ORAL | Status: DC | PRN

## 2017-04-14 MED ORDER — HEPARIN SODIUM (PORCINE) 5000 UNIT/ML IJ SOLN
5000.0000 [IU] | Freq: Three times a day (TID) | INTRAMUSCULAR | Status: DC
  Administered 2017-04-14 – 2017-04-15 (×4): 5000 [IU] via SUBCUTANEOUS
  Filled 2017-04-14 (×4): qty 1

## 2017-04-14 MED ORDER — ONDANSETRON HCL 4 MG/2ML IJ SOLN: 4.0000 mg | Freq: Four times a day (QID) | INTRAMUSCULAR | Status: DC | PRN

## 2017-04-14 MED ORDER — PONATINIB HCL 15 MG PO TABS
15.0000 mg | ORAL_TABLET | Freq: Every day | ORAL | Status: DC
  Administered 2017-04-14: 15 mg via ORAL
  Filled 2017-04-14: qty 1

## 2017-04-14 MED ORDER — OSELTAMIVIR PHOSPHATE 75 MG PO CAPS: 75.0000 mg | ORAL_CAPSULE | Freq: Two times a day (BID) | ORAL | Status: DC

## 2017-04-14 MED ORDER — BACLOFEN 10 MG PO TABS
10.0000 mg | ORAL_TABLET | Freq: Three times a day (TID) | ORAL | Status: DC
  Administered 2017-04-14 – 2017-04-15 (×4): 10 mg via ORAL
  Filled 2017-04-14 (×4): qty 1

## 2017-04-14 MED ORDER — OXYCODONE HCL 5 MG PO TABS
10.0000 mg | ORAL_TABLET | Freq: Three times a day (TID) | ORAL | Status: DC | PRN
  Administered 2017-04-14: 13:00:00 10 mg via ORAL
  Filled 2017-04-14: qty 2

## 2017-04-14 MED ORDER — PREDNISONE 10 MG PO TABS
5.0000 mg | ORAL_TABLET | Freq: Every day | ORAL | Status: DC
  Administered 2017-04-14 – 2017-04-15 (×2): 5 mg via ORAL
  Filled 2017-04-14 (×2): qty 1

## 2017-04-14 MED ORDER — VITAMIN D 1000 UNITS PO TABS
2000.0000 [IU] | ORAL_TABLET | Freq: Every day | ORAL | Status: DC
  Administered 2017-04-14 – 2017-04-15 (×2): 2000 [IU] via ORAL
  Filled 2017-04-14 (×2): qty 2

## 2017-04-14 MED ORDER — ONDANSETRON HCL 4 MG PO TABS: 4.0000 mg | ORAL_TABLET | Freq: Four times a day (QID) | ORAL | Status: DC | PRN

## 2017-04-14 MED ORDER — LORATADINE 10 MG PO TABS
10.0000 mg | ORAL_TABLET | Freq: Every day | ORAL | Status: DC
  Administered 2017-04-15: 09:00:00 10 mg via ORAL
  Filled 2017-04-14 (×2): qty 1

## 2017-04-14 MED ORDER — LINACLOTIDE 290 MCG PO CAPS
290.0000 ug | ORAL_CAPSULE | Freq: Every day | ORAL | Status: DC
  Administered 2017-04-14 – 2017-04-15 (×2): 290 ug via ORAL
  Filled 2017-04-14 (×2): qty 1

## 2017-04-14 MED ORDER — DULOXETINE HCL 20 MG PO CPEP
20.0000 mg | ORAL_CAPSULE | Freq: Every day | ORAL | Status: DC
  Administered 2017-04-14 – 2017-04-15 (×2): 20 mg via ORAL
  Filled 2017-04-14 (×2): qty 1

## 2017-04-14 MED ORDER — GUAIFENESIN-DM 100-10 MG/5ML PO SYRP
5.0000 mL | ORAL_SOLUTION | ORAL | Status: DC | PRN
  Filled 2017-04-14: qty 5

## 2017-04-14 MED ORDER — PIPERACILLIN-TAZOBACTAM 3.375 G IVPB: 3.3750 g | Freq: Three times a day (TID) | INTRAVENOUS | Status: DC

## 2017-04-14 NOTE — ED Notes (Signed)
Pt updated on care plan. Pt verbalizes understanding.  

## 2017-04-14 NOTE — ED Notes (Signed)
Critical lactic acid of 2.7 called from lab. Dr. Quentin Cornwall notified, new orders in process.

## 2017-04-14 NOTE — H&P (Signed)
Laura Chen is an 52 y.o. female.   Chief Complaint: Weakness HPI: Patient with past medical history of drug-induced lupus, hypertension and CML presents to the emergency department complaining of general malaise.  The patient has been feeling weak and generally unwell for 3 days.  She reports feeling lightheaded when she stands.  She denies fever, nausea, vomiting or diarrhea.  Laboratory evaluation revealed acute on chronic kidney injury.  The patient was also found to be flu positive.  Due to her overall deconditioning and comorbidities the emergency department staff called the hospitalist service for further management.  Past Medical History:  Diagnosis Date  . Asthma   . CML (chronic myeloid leukemia) ([place]) 2011   (Dr. Alyse Low) stopped gleevec 2/2 side effects  . GERD (gastroesophageal reflux disease)   . History of shingles   . History of syphilis 1990   s/p treatment  . Hypertension   . Leukemia ([place])   . Osteopenia    due to prednisone use, was on reclast  . PUD (peptic ulcer disease)   . Rheumatoid arthritis(714.0) 1990s   Jacoud's arthropathy, previously treated with MTX, TNFa (remicade, enbrel, humira, orencia, rituximab, and actemra)  . SLE (systemic lupus erythematosus) ([place]) 09/2009   Rheum-Dr. Joan Mayans  . Thyroid goiter    Endo-Dr. Eddie Dibbles    Past Surgical History:  Procedure Laterality Date  . DEXA  2010   osteopenia  . MYOMECTOMY  2008  . right ankle AFO  2011  . TRANSTHORACIC ECHOCARDIOGRAM  09/8500   nl systolic/diastolic fxn, EF 77%, mild MR, normal PA pressures  . US/HSG  05/2010   possible endometrial polyp, rec rpt 8 wks continue loestrin 24 [facility])    Family History  Problem Relation Age of Onset  . Heart attack Maternal Grandmother   . Coronary artery disease Maternal Grandmother   . Hypertension Mother   . Hypertension Unknown        Aunt   Social History:  reports that she has quit smoking. Her smoking use included cigarettes. She  smoked 0.30 packs per day. she has never used smokeless tobacco. She reports that she does not drink alcohol or use drugs.  Allergies:  Allergies  Allergen Reactions  . Ibuprofen Swelling  . Sulfa Antibiotics Rash and Other (See Comments)    Reaction:  Burning of feet   . Tape Itching and Rash  . Thiazide-Type Diuretics Rash    Medications Prior to Admission  Medication Sig Dispense Refill  . aspirin EC 81 MG tablet Take 81 mg by mouth every evening.     Marland Kitchen azithromycin (ZITHROMAX) 250 MG tablet z-pack - take as directed for 5 days 6 tablet 0  . baclofen (LIORESAL) 10 MG tablet Take 10 mg by mouth 3 (three) times daily.    . cetirizine (ZYRTEC) 10 MG tablet Take 10 mg by mouth.    . chlorthalidone (HYGROTON) 25 MG tablet Take 25 mg by mouth daily.    . cholecalciferol (VITAMIN D) 1000 units tablet Take 2,000 Units by mouth daily.    . DULoxetine (CYMBALTA) 20 MG capsule Take 20 mg by mouth daily.     . fluticasone (FLONASE) 50 MCG/ACT nasal spray Place 1 spray into both nostrils daily. 16 g 3  . Linaclotide (LINZESS) 290 MCG CAPS capsule Take 290 mcg by mouth daily.    . Oxycodone HCl 10 MG TABS Take 10 mg by mouth 3 (three) times daily as needed (for pain).    . ponatinib HCl (ICLUSIG)  15 MG tablet Take 30 mg by mouth daily.    . potassium chloride SA (K-DUR,KLOR-CON) 20 MEQ tablet Take 2 tablets (40 mEq total) by mouth 2 (two) times daily. (Patient taking differently: Take 60 mEq by mouth 2 (two) times daily. ) 120 tablet 0  . predniSONE (DELTASONE) 5 MG tablet Take 5 mg by mouth daily with breakfast.    . rosuvastatin (CRESTOR) 5 MG tablet Take 5 mg by mouth daily with supper.    . traMADol (ULTRAM) 50 MG tablet Take 100 mg by mouth 2 (two) times daily.    Marland Kitchen ACCU-CHEK FASTCLIX LANCETS MISC USE TO TEST BLOOD SUGAR QD UTD  3  . ACCU-CHEK SMARTVIEW test strip TEST UTD ONCE D  3  . clotrimazole-betamethasone (LOTRISONE) cream Apply 1 application topically 2 (two) times daily. (Patient  not taking: Reported on 04/13/2017) 45 g 2  . fluconazole (DIFLUCAN) 150 MG tablet Take 1 tablet p QOD for 5 doses (Patient not taking: Reported on 04/13/2017) 5 tablet 2  . metroNIDAZOLE (METROGEL) 0.75 % vaginal gel Place 1 Applicatorful vaginally at bedtime. Use for 7 nights (Patient not taking: Reported on 04/13/2017) 70 g 0  . nystatin (MYCOSTATIN) 100000 UNIT/ML suspension Take 5 mLs (500,000 Units total) by mouth 4 (four) times daily. (Patient not taking: Reported on 04/13/2017) 400 mL 2  . valACYclovir (VALTREX) 1000 MG tablet Take 1 tablet po BID for 5 days prn (Patient not taking: Reported on 04/13/2017) 20 tablet 2    Results for orders placed or performed during the hospital encounter of 04/13/17 (from the past 48 hour(s))  Comprehensive metabolic panel     Status: Abnormal   Collection Time: 04/13/17  9:52 PM  Result Value Ref Range   Sodium 134 (L) 135 - 145 mmol/L   Potassium 2.6 (LL) 3.5 - 5.1 mmol/L    Comment: CRITICAL RESULT CALLED TO, READ BACK BY AND VERIFIED WITH KAILEY WALKER AT 2251 ON 04/13/17 RWW    Chloride 95 (L) 101 - 111 mmol/L   CO2 22 22 - 32 mmol/L   Glucose, Bld 165 (H) 65 - 99 mg/dL   BUN 20 6 - 20 mg/dL   Creatinine, Ser 1.58 (H) 0.44 - 1.00 mg/dL   Calcium 8.7 (L) 8.9 - 10.3 mg/dL   Total Protein 7.6 6.5 - 8.1 g/dL   Albumin 3.8 3.5 - 5.0 g/dL   AST 60 (H) 15 - 41 U/L   ALT 74 (H) 14 - 54 U/L   Alkaline Phosphatase 240 (H) 38 - 126 U/L   Total Bilirubin 0.5 0.3 - 1.2 mg/dL   GFR calc non Af Amer 37 (L) >60 mL/min   GFR calc Af Amer 43 (L) >60 mL/min    Comment: (NOTE) The eGFR has been calculated using the CKD EPI equation. This calculation has not been validated in all clinical situations. eGFR's persistently <60 mL/min signify possible Chronic Kidney Disease.    Anion gap 17 (H) 5 - 15    Comment: Performed at [facility], [address] [place], [place] [address]  Troponin I     Status: Abnormal   Collection Time: 04/13/17  9:52 PM   Result Value Ref Range   Troponin I 0.03 (HH) <0.03 ng/mL    Comment: CRITICAL RESULT CALLED TO, READ BACK BY AND VERIFIED WITH KAILEY WALKER AT 2251 ON 04/13/17 RWW Performed at [facility], [address], [place], [place] [address]   CBC WITH DIFFERENTIAL     Status:  Abnormal   Collection Time: 04/13/17  9:52 PM  Result Value Ref Range   WBC 8.3 3.6 - 11.0 K/uL   RBC 6.62 (H) 3.80 - 5.20 MIL/uL   Hemoglobin 15.0 12.0 - 16.0 g/dL    Comment: RESULT REPEATED AND VERIFIED   HCT 48.9 (H) 35.0 - 47.0 %   MCV 73.9 (L) 80.0 - 100.0 fL   MCH 22.7 (L) 26.0 - 34.0 pg   MCHC 30.7 (L) 32.0 - 36.0 g/dL   RDW 22.4 (H) 11.5 - 14.5 %   Platelets 267 150 - 440 K/uL   Neutrophils Relative % 61 %   Neutro Abs 5.1 1.4 - 6.5 K/uL   Lymphocytes Relative 20 %   Lymphs Abs 1.6 1.0 - 3.6 K/uL   Monocytes Relative 18 %   Monocytes Absolute 1.5 (H) 0.2 - 0.9 K/uL   Eosinophils Relative 1 %   Eosinophils Absolute 0.0 0 - 0.7 K/uL   Basophils Relative 0 %   Basophils Absolute 0.0 0 - 0.1 K/uL    Comment: Performed at [facility], [address] [place], [place] [address]  Procalcitonin     Status: None   Collection Time: 04/13/17  9:52 PM  Result Value Ref Range   Procalcitonin 0.38 ng/mL    Comment:        Interpretation: PCT (Procalcitonin) <= 0.5 ng/mL: Systemic infection (sepsis) is not likely. Local bacterial infection is possible. (NOTE)       Sepsis PCT Algorithm           Lower Respiratory Tract                                      Infection PCT Algorithm    ----------------------------     ----------------------------         PCT < 0.25 ng/mL                PCT < 0.10 ng/mL         Strongly encourage             Strongly discourage   discontinuation of antibiotics    initiation of antibiotics    ----------------------------     -----------------------------       PCT 0.25 - 0.50 ng/mL            PCT 0.10 - 0.25 ng/mL               OR       >80% decrease in PCT             Discourage initiation of                                            antibiotics      Encourage discontinuation           of antibiotics    ----------------------------     -----------------------------         PCT >= 0.50 ng/mL              PCT 0.26 - 0.50 ng/mL               AND        <80% decrease in PCT  Encourage initiation of                                             antibiotics       Encourage continuation           of antibiotics    ----------------------------     -----------------------------        PCT >= 0.50 ng/mL                  PCT > 0.50 ng/mL               AND         increase in PCT                  Strongly encourage                                      initiation of antibiotics    Strongly encourage escalation           of antibiotics                                     -----------------------------                                           PCT <= 0.25 ng/mL                                                 OR                                        > 80% decrease in PCT                                     Discontinue / Do not initiate                                             antibiotics Performed at [facility], [address] [place], [place] [address]   Lactic acid, plasma     Status: Abnormal   Collection Time: 04/13/17 10:02 PM  Result Value Ref Range   Lactic Acid, Venous 2.7 (HH) 0.5 - 1.9 mmol/L    Comment: CRITICAL RESULT CALLED TO, READ BACK BY AND VERIFIED WITH DAVID WALKER AT 2300 04/13/17.PMH Performed at [facility], [place], [place] [address]   Lactic acid, plasma     Status: Abnormal   Collection Time: 04/13/17 11:47 PM  Result Value Ref Range   Lactic Acid, Venous 2.0 (HH) 0.5 - 1.9 mmol/L    Comment: CRITICAL RESULT CALLED TO, READ BACK BY AND VERIFIED WITH APRIL  BRUMGARD AT 0138 04/14/17.PMH Performed at [facility], [address] [place], [place] [address]    Influenza panel by PCR (type A & B)     Status: Abnormal   Collection Time: 04/13/17 11:47 PM  Result Value Ref Range   Influenza A By PCR POSITIVE (A) NEGATIVE   Influenza B By PCR NEGATIVE NEGATIVE    Comment: (NOTE) The Xpert Xpress Flu assay is intended as an aid in the diagnosis of  influenza and should not be used as a sole basis for treatment.  This  assay is FDA approved for nasopharyngeal swab specimens only. Nasal  washings and aspirates are unacceptable for Xpert Xpress Flu testing. Performed at [facility], [address] [place], [place] [address]    Dg Chest Port 1 View  Result Date: 04/13/2017 CLINICAL DATA:  Dehydration, anorexia for 3 days. EXAM: PORTABLE CHEST 1 VIEW COMPARISON:  Chest radiograph April 30, 2015 FINDINGS: Cardiomediastinal silhouette is normal. Persistently elevated RIGHT hemidiaphragm with juxta peaking. Bronchitic changes and strandy densities LEFT lung base. RIGHT major fissure pleural thickening. No focal consolidation. No pneumothorax. Soft tissue planes and included osseous structures are non suspicious. IMPRESSION: Bronchitic changes with LEFT lung base atelectasis/scarring. Persistently elevated RIGHT hemidiaphragm with pleural thickening, less likely small pleural effusion. Electronically Signed   By: Elon Alas M.D.   On: 04/13/2017 22:18    Review of Systems  Constitutional: Positive for malaise/fatigue. Negative for chills and fever.  HENT: Negative for sore throat and tinnitus.   Eyes: Negative for blurred vision and redness.  Respiratory: Negative for cough and shortness of breath.   Cardiovascular: Negative for chest pain, palpitations, orthopnea and PND.  Gastrointestinal: Negative for abdominal pain, diarrhea, nausea and vomiting.  Genitourinary: Negative for dysuria, frequency and urgency.  Musculoskeletal: Negative for joint pain and myalgias.  Skin: Negative for rash.       No lesions  Neurological: Negative for  speech change, focal weakness and weakness.  Endo/Heme/Allergies: Does not bruise/bleed easily.       No temperature intolerance  Psychiatric/Behavioral: Negative for depression and suicidal ideas.    Blood pressure (!) 114/56, pulse (!) 107, temperature 98.9 F (37.2 C), temperature source Oral, resp. rate 18, height _0  (1.651 m), weight 110.7 kg (244 lb), SpO2 100 %. Physical Exam  Vitals reviewed. Constitutional: She is oriented to person, place, and time. She appears well-developed and well-nourished. No distress.  HENT:  Head: Normocephalic and atraumatic.  Mouth/Throat: Oropharynx is clear and moist.  Eyes: Conjunctivae and EOM are normal. Pupils are equal, round, and reactive to light. No scleral icterus.  Neck: Normal range of motion. Neck supple. No JVD present. No tracheal deviation present. No thyromegaly present.  Cardiovascular: Normal rate, regular rhythm and normal heart sounds. Exam reveals no gallop and no friction rub.  No murmur heard. Respiratory: Effort normal and breath sounds normal.  GI: Soft. Bowel sounds are normal. She exhibits no distension. There is no tenderness.  Genitourinary:  Genitourinary Comments: Deferred  Musculoskeletal: Normal range of motion. She exhibits no edema.  Lymphadenopathy:    She has no cervical adenopathy.  Neurological: She is alert and oriented to person, place, and time. No cranial nerve deficit. She exhibits normal muscle tone.  Skin: Skin is warm and dry. No rash noted. No erythema.  Psychiatric: She has a normal mood and affect. Her behavior is normal. Judgment and thought content normal.     Assessment/Plan This is a 52 year old female admitted for  acute on chronic kidney injury. 1.  CKD: Acute on chronic; secondary to dehydration.  Hydrate with intravenous fluid.  Avoid nephrotoxic agents. 2.  Hypokalemia: Acute on chronic; contributing to weakness.  Replete potassium. 3.  Influenza: Positive for Type A.  Start  Tamiflu. 4.  Rheumatoid arthritis: No indication of acute flare at this time.  Continue daily prednisone per home regimen 5.  Hypertension: Controlled; continue chlorthalidone 6.  Diabetes mellitus type 2: Check hemoglobin A1c; sliding-scale insulin while hospitalized. 7.  Hyperlipidemia: Continue statin therapy 8.  DVT prophylaxis: Heparin 9.  GI prophylaxis: None The patient is a full code.  Time spent on admission orders and patient care approximately 45 minutes  Harrie Foreman, MD 04/14/2017, 4:04 AM

## 2017-04-14 NOTE — Progress Notes (Signed)
Pharmacy Antibiotic Note  Laura Chen is a 52 y.o. female admitted on 04/13/2017 with sepsis.  Pharmacy has been consulted for vancomycin and Zosyn dosing.  Plan: Zosyn 3.375g IV q8h (4 hour infusion).  DW 79kg  Vd 55L kei 0.048 hr-1  T1/2 14 hours Vancomycin 1250 mg q 18 hours ordered with stacked dosing. Level before 5th dose. Goal trough 15-20.  Height: 5\' 5"  (165.1 cm) Weight: 244 lb (110.7 kg) IBW/kg (Calculated) : 57  Temp (24hrs), Avg:99.1 F (37.3 C), Min:99 F (37.2 C), Max:99.2 F (37.3 C)  Recent Labs  Lab 04/13/17 2152 04/13/17 2202  WBC 8.3  --   CREATININE 1.58*  --   LATICACIDVEN  --  2.7*    Estimated Creatinine Clearance: 53.4 mL/min (A) (by C-G formula based on SCr of 1.58 mg/dL (H)).    Allergies  Allergen Reactions  . Ibuprofen Swelling  . Sulfa Antibiotics Rash and Other (See Comments)    Reaction:  Burning of feet   . Tape Itching and Rash  . Thiazide-Type Diuretics Rash    Antimicrobials this admission: Vancomycin, Zosyn 2/22  >>    >>   Dose adjustments this admission:   Microbiology results: 2/22 BCx: pending 2/22 UCx: pending       2/22 UA: pending 2/22 CXR: no focal consoidation  Thank you for allowing pharmacy to be a part of this patient's care.  Ajai Harville S 04/14/2017 12:18 AM

## 2017-04-14 NOTE — ED Notes (Signed)
Critical lactic acid of 2.0 called from lab. Dr. Beather Arbour notified, no new orders received.

## 2017-04-14 NOTE — Progress Notes (Signed)
[place] at [facility]                                                                                                                                                                                  Patient Demographics   Laura Chen, is a 52 y.o. female, DOB - 1965/10/06, NTI:[id number]  Admit date - 04/13/2017   Admitting Physician Harrie Foreman, MD  Outpatient Primary MD for the patient is Lavera Guise, MD   LOS - 0  Subjective:  Patient continues to complain of weakness complains of cough and congestion States that difficulty with swallowing  Review of Systems:   CONSTITUTIONAL: No documented fever. No fatigue, weakness. No weight gain, no weight loss.  EYES: No blurry or double vision.  ENT: No tinnitus. No postnasal drip. No redness of the oropharynx.  RESPIRATORY: Positive cough, no wheeze, no hemoptysis. No dyspnea.  CARDIOVASCULAR: No chest pain. No orthopnea. No palpitations. No syncope.  GASTROINTESTINAL: No nausea, no vomiting or diarrhea. No abdominal pain. No melena or hematochezia.  GENITOURINARY: No dysuria or hematuria.  ENDOCRINE: No polyuria or nocturia. No heat or cold intolerance.  HEMATOLOGY: No anemia. No bruising. No bleeding.  INTEGUMENTARY: No rashes. No lesions.  MUSCULOSKELETAL: No arthritis. No swelling. No gout.  NEUROLOGIC: No numbness, tingling, or ataxia. No seizure-type activity.  PSYCHIATRIC: No anxiety. No insomnia. No ADD.    Vitals:   Vitals:   04/14/17 0200 04/14/17 0230 04/14/17 0355 04/14/17 0745  BP: (!) 142/96 120/83 (!) 114/56 (!) 119/58  Pulse: 94 93 (!) 107 (!) 109  Resp: 13 17 18    Temp:   98.9 F (37.2 C) 99.2 F (37.3 C)  TempSrc:   Oral Oral  SpO2: 97% 99% 100% 99%  Weight:   240 lb (108.9 kg)   Height:   5\' 3"  (1.6 m)     Wt Readings from Last 3 Encounters:  04/14/17 240 lb (108.9 kg)  03/13/17 241 lb (109.3 kg)  10/10/16 229 lb 11.2 oz (104.2 kg)     Intake/Output Summary  (Last 24 hours) at 04/14/2017 1426 Last data filed at 04/14/2017 0900 Gross per 24 hour  Intake 3772.92 ml  Output 1200 ml  Net 2572.92 ml    Physical Exam:   GENERAL: Pleasant-appearing in no apparent distress.  HEAD, EYES, EARS, NOSE AND THROAT: Atraumatic, normocephalic. Extraocular muscles are intact. Pupils equal and reactive to light. Sclerae anicteric. No conjunctival injection. No oro-pharyngeal erythema.  NECK: Supple. There is no jugular venous distention. No bruits, no lymphadenopathy, no thyromegaly.  HEART: Regular rate and rhythm,. No murmurs, no rubs, no clicks.  LUNGS: Clear to auscultation bilaterally. No  rales or rhonchi. No wheezes.  ABDOMEN: Soft, flat, nontender, nondistended. Has good bowel sounds. No hepatosplenomegaly appreciated.  EXTREMITIES: No evidence of any cyanosis, clubbing, or peripheral edema.  +2 pedal and radial pulses bilaterally.  NEUROLOGIC: The patient is alert, awake, and oriented x3 with no focal motor or sensory deficits appreciated bilaterally.  SKIN: Moist and warm with no rashes appreciated.  Psych: Not anxious, depressed LN: No inguinal LN enlargement    Antibiotics   Anti-infectives (From admission, onward)   Start     Dose/Rate Route Frequency Ordered Stop   04/14/17 1000  oseltamivir (TAMIFLU) capsule 75 mg  Status:  Discontinued     75 mg Oral 2 times daily 04/14/17 0355 04/14/17 0415   04/14/17 1000  oseltamivir (TAMIFLU) capsule 30 mg     30 mg Oral 2 times daily 04/14/17 0416 04/19/17 0959   04/14/17 0600  vancomycin (VANCOCIN) 1,250 mg in sodium chloride 0.9 % 250 mL IVPB  Status:  Discontinued     1,250 mg 166.7 mL/hr over 90 Minutes Intravenous Every 18 hours 04/14/17 0017 04/14/17 0355   04/14/17 0600  piperacillin-tazobactam (ZOSYN) IVPB 3.375 g  Status:  Discontinued     3.375 g 12.5 mL/hr over 240 Minutes Intravenous Every 8 hours 04/14/17 0017 04/14/17 0355   04/14/17 0000  piperacillin-tazobactam (ZOSYN) IVPB 3.375 g      3.375 g 100 mL/hr over 30 Minutes Intravenous  Once 04/13/17 2351 04/14/17 0034   04/14/17 0000  vancomycin (VANCOCIN) IVPB 1000 mg/200 mL premix     1,000 mg 200 mL/hr over 60 Minutes Intravenous  Once 04/13/17 2351 04/14/17 0146      Medications   Scheduled Meds: . aspirin EC  81 mg Oral QPM  . baclofen  10 mg Oral TID  . chlorthalidone  25 mg Oral Daily  . cholecalciferol  2,000 Units Oral Daily  . docusate sodium  100 mg Oral BID  . DULoxetine  20 mg Oral Daily  . fluticasone  1 spray Each Nare Daily  . guaiFENesin  600 mg Oral BID  . heparin  5,000 Units Subcutaneous Q8H  . insulin aspart  0-5 Units Subcutaneous QHS  . insulin aspart  0-9 Units Subcutaneous TID WC  . linaclotide  290 mcg Oral Daily  . loratadine  10 mg Oral Daily  . magic mouthwash  5 mL Oral QID  . oseltamivir  30 mg Oral BID  . ponatinib HCl  15 mg Oral Daily  . potassium chloride SA  40 mEq Oral BID  . predniSONE  5 mg Oral Q breakfast  . rosuvastatin  5 mg Oral Q supper  . traMADol  100 mg Oral BID   Continuous Infusions: . 0.9 % NaCl with KCl 40 mEq / L 125 mL/hr (04/14/17 1226)   PRN Meds:.acetaminophen **OR** acetaminophen, guaiFENesin-dextromethorphan, ondansetron **OR** ondansetron (ZOFRAN) IV, oxyCODONE   Data Review:   Micro Results Recent Results (from the past 240 hour(s))  Blood Culture (routine x 2)     Status: None (Preliminary result)   Collection Time: 04/13/17 10:02 PM  Result Value Ref Range Status   Specimen Description BLOOD LEFT ANTECUBITAL  Final   Special Requests   Final    BOTTLES DRAWN AEROBIC AND ANAEROBIC Blood Culture adequate volume   Culture   Final    NO GROWTH < 12 HOURS Performed at [facility], [address], [place], [place] [address]    Report Status PENDING  Incomplete  Blood Culture (routine  x 2)     Status: None (Preliminary result)   Collection Time: 04/13/17 10:02 PM  Result Value Ref Range Status   Specimen Description BLOOD RIGHT  ANTECUBITAL  Final   Special Requests   Final    BOTTLES DRAWN AEROBIC AND ANAEROBIC Blood Culture adequate volume   Culture   Final    NO GROWTH < 12 HOURS Performed at [facility], [address] [place], [place] [address]    Report Status PENDING  Incomplete    Radiology Reports Dg Chest Port 1 View  Result Date: 04/13/2017 CLINICAL DATA:  Dehydration, anorexia for 3 days. EXAM: PORTABLE CHEST 1 VIEW COMPARISON:  Chest radiograph April 30, 2015 FINDINGS: Cardiomediastinal silhouette is normal. Persistently elevated RIGHT hemidiaphragm with juxta peaking. Bronchitic changes and strandy densities LEFT lung base. RIGHT major fissure pleural thickening. No focal consolidation. No pneumothorax. Soft tissue planes and included osseous structures are non suspicious. IMPRESSION: Bronchitic changes with LEFT lung base atelectasis/scarring. Persistently elevated RIGHT hemidiaphragm with pleural thickening, less likely small pleural effusion. Electronically Signed   By: Elon Alas M.D.   On: 04/13/2017 22:18     CBC Recent Labs  Lab 04/13/17 2152  WBC 8.3  HGB 15.0  HCT 48.9*  PLT 267  MCV 73.9*  MCH 22.7*  MCHC 30.7*  RDW 22.4*  LYMPHSABS 1.6  MONOABS 1.5*  EOSABS 0.0  BASOSABS 0.0    Chemistries  Recent Labs  Lab 04/13/17 2152 04/14/17 1303  NA 134* 136  K 2.6* 3.0*  CL 95* 106  CO2 22 23  GLUCOSE 165* 131*  BUN 20 12  CREATININE 1.58* 1.02*  CALCIUM 8.7* 7.3*  MG  --  1.8  AST 60*  --   ALT 74*  --   ALKPHOS 240*  --   BILITOT 0.5  --    ------------------------------------------------------------------------------------------------------------------ estimated creatinine clearance is 79 mL/min (A) (by C-G formula based on SCr of 1.02 mg/dL (H)). ------------------------------------------------------------------------------------------------------------------ No results for input(s): HGBA1C in the last 72  hours. ------------------------------------------------------------------------------------------------------------------ No results for input(s): CHOL, HDL, LDLCALC, TRIG, CHOLHDL, LDLDIRECT in the last 72 hours. ------------------------------------------------------------------------------------------------------------------ Recent Labs    04/13/17 2152  TSH 3.932   ------------------------------------------------------------------------------------------------------------------ No results for input(s): VITAMINB12, FOLATE, FERRITIN, TIBC, IRON, RETICCTPCT in the last 72 hours.  Coagulation profile No results for input(s): INR, PROTIME in the last 168 hours.  No results for input(s): DDIMER in the last 72 hours.  Cardiac Enzymes Recent Labs  Lab 04/13/17 2152  TROPONINI 0.03*   ------------------------------------------------------------------------------------------------------------------ Invalid input(s): POCBNP    Assessment & Plan   1.  CKD: Acute on chronic; secondary to dehydration.    Continue IV fluids repeat renal function in the morning  2.  Hypokalemia: Acute on chronic; contributing to weakness.  Recheck potassium now 3.  Influenza: Positive for Type A.    Continue Tamiflu. 4.  Rheumatoid arthritis: No indication of acute flare at this time.  Continue daily prednisone per home regimen 5.  Hypertension: Controlled; continue chlorthalidone 6.  Diabetes mellitus type 2:  sliding-scale insulin while hospitalized. 7.  Hyperlipidemia: Continue statin therapy 8.  DVT prophylaxis: Heparin 9.  GI prophylaxis: None The patient is a full code.  Time spent on admission orders and patient care approximately 45 minutes        Code Status Orders  (From admission, onward)        Start     Ordered   04/14/17 0355  Full code  Continuous  04/14/17 0355    Code Status History    Date Active Date Inactive Code Status Order ID Comments User Context   04/30/2015  19:54 05/02/2015 18:47 Full Code [id number]  Epifanio Lesches, MD ED           Consults  none   DVT Prophylaxis  Lovenox    Lab Results  Component Value Date   PLT 267 04/13/2017     Time Spent in minutes   75min  Greater than 50% of time spent in care coordination and counseling patient regarding the condition and plan of care.   Dustin Flock M.D on 04/14/2017 at 2:26 PM  Between 7am to 6pm - Pager - [phone]  After 6pm go to [email or web address] - password [email or web address] [facility]   Office  [phone]

## 2017-04-14 NOTE — ED Notes (Signed)
Attempt to call report, rn not available at this time.

## 2017-04-15 DIAGNOSIS — N179 Acute kidney failure, unspecified: Secondary | ICD-10-CM | POA: Diagnosis not present

## 2017-04-15 DIAGNOSIS — E876 Hypokalemia: Secondary | ICD-10-CM | POA: Diagnosis not present

## 2017-04-15 DIAGNOSIS — M069 Rheumatoid arthritis, unspecified: Secondary | ICD-10-CM | POA: Diagnosis not present

## 2017-04-15 LAB — BASIC METABOLIC PANEL
Anion gap: 8 (ref 5–15)
BUN: 7 mg/dL (ref 6–20)
CO2: 23 mmol/L (ref 22–32)
Calcium: 7.1 mg/dL — ABNORMAL LOW (ref 8.9–10.3)
Chloride: 109 mmol/L (ref 101–111)
Creatinine, Ser: 0.87 mg/dL (ref 0.44–1.00)
GFR calc Af Amer: >60 mL/min (ref >60)
GFR calc non Af Amer: >60 mL/min (ref >60)
Glucose, Bld: 85 mg/dL (ref 65–99)
Potassium: 3.2 mmol/L — ABNORMAL LOW (ref 3.5–5.1)
Sodium: 140 mmol/L (ref 135–145)

## 2017-04-15 LAB — GLUCOSE, CAPILLARY: Glucose-Capillary: 72 mg/dL (ref 65–99)

## 2017-04-15 LAB — HEMOGLOBIN A1C
Hgb A1c MFr Bld: 6.8 % — ABNORMAL HIGH (ref 4.8–5.6)
Mean Plasma Glucose: 148 mg/dL

## 2017-04-15 LAB — URINE CULTURE

## 2017-04-15 MED ORDER — POTASSIUM CHLORIDE ER 10 MEQ PO TBCR: 10.0000 meq | EXTENDED_RELEASE_TABLET | Freq: Every day | ORAL | 0 refills | Status: DC

## 2017-04-15 MED ORDER — POTASSIUM CHLORIDE CRYS ER 20 MEQ PO TBCR
40.0000 meq | EXTENDED_RELEASE_TABLET | Freq: Once | ORAL | Status: AC
  Administered 2017-04-15: 40 meq via ORAL
  Filled 2017-04-15: qty 2

## 2017-04-15 MED ORDER — OSELTAMIVIR PHOSPHATE 75 MG PO CAPS: 75.0000 mg | ORAL_CAPSULE | Freq: Two times a day (BID) | ORAL | 0 refills | Status: DC

## 2017-04-15 MED ORDER — MAGIC MOUTHWASH: 5.0000 mL | Freq: Four times a day (QID) | ORAL | 0 refills | Status: DC

## 2017-04-15 MED ORDER — OSELTAMIVIR PHOSPHATE 75 MG PO CAPS
75.0000 mg | ORAL_CAPSULE | Freq: Two times a day (BID) | ORAL | Status: DC
  Administered 2017-04-15: 75 mg via ORAL
  Filled 2017-04-15: qty 1

## 2017-04-15 MED ORDER — PANTOPRAZOLE SODIUM 40 MG PO TBEC
40.0000 mg | DELAYED_RELEASE_TABLET | Freq: Every day | ORAL | Status: DC
  Filled 2017-04-15: qty 1

## 2017-04-15 MED ORDER — GUAIFENESIN-DM 100-10 MG/5ML PO SYRP: 5.0000 mL | ORAL_SOLUTION | ORAL | 0 refills | Status: DC | PRN

## 2017-04-15 NOTE — Plan of Care (Addendum)
Pt w/leukemia and rheumatoid arthritis positive this admission for flu.  Treated w/IVF and tamiflu.  Pt is d/ced home and is transporting home with her mother.  K+ has been low and she will be d/ced on additional dose of K+ along with tamiflu.  IVs removed by nurse tech.  Her chemo med for leukemia, held in pharmacy was returned to her.

## 2017-04-15 NOTE — Discharge Instructions (Signed)
[facility] [place] at [facility] ° °DIET:  °Cardiac diet ° °DISCHARGE CONDITION:  °Stable ° °ACTIVITY:  °Activity as tolerated ° °OXYGEN:  °Home Oxygen: No. °  °Oxygen Delivery: room air ° °DISCHARGE LOCATION:  °home  ° ° °ADDITIONAL DISCHARGE INSTRUCTION: ° ° °If you experience worsening of your admission symptoms, develop shortness of breath, life threatening emergency, suicidal or homicidal thoughts you must seek medical attention immediately by calling 911 or calling your MD immediately  if symptoms less severe. ° °You Must read complete instructions/literature along with all the possible adverse reactions/side effects for all the Medicines you take and that have been prescribed to you. Take any new Medicines after you have completely understood and accpet all the possible adverse reactions/side effects.  ° °Please note ° °You were cared for by a hospitalist during your hospital stay. If you have any questions about your discharge medications or the care you received while you were in the hospital after you are discharged, you can call the unit and asked to speak with the hospitalist on call if the hospitalist that took care of you is not available. Once you are discharged, your primary care physician will handle any further medical issues. Please note that NO REFILLS for any discharge medications will be authorized once you are discharged, as it is imperative that you return to your primary care physician (or establish a relationship with a primary care physician if you do not have one) for your aftercare needs so that they can reassess your need for medications and monitor your lab values. ° ° °

## 2017-04-15 NOTE — Discharge Summary (Signed)
[place] at [place], 52 y.o., DOB 11/05/1965, MRN [id number]. Admission date: 04/13/2017 Discharge Date 04/15/2017 Primary MD Lavera Guise, MD Admitting Physician Harrie Foreman, MD  Admission Diagnosis  Dehydration [E86.0] Lactic acidosis [E87.2] Sepsis, due to unspecified organism [facility]) [A41.9]  Discharge Diagnosis   Active Problems:   Acute on chronic kidney failure (HCC) Hypokalemia Influenza type a Rheumatoid arthritis Essential hypertension DM2 hyperlipemia      Hospital Course  Patient with past medical history of drug-induced lupus, hypertension and CML presents to the emergency department complaining of general malaise.  The patient has been feeling weak and generally unwell for 3 days.  She reports feeling lightheaded when she stands.  She denies fever, nausea, vomiting or diarrhea.  Laboratory evaluation revealed acute on chronic kidney injury.  The patient was also found to be flu positive.  Due to her overall deconditioning and comorbidities the emergency department staff called the hospitalist service for further management. Patient was given IV fluids with resolution of his acute renal failure.  He was also treated for influenza.  She is doing much better with these treatments.             Consults  None  Significant Tests:  See full reports for all details     Dg Chest Port 1 View  Result Date: 04/13/2017 CLINICAL DATA:  Dehydration, anorexia for 3 days. EXAM: PORTABLE CHEST 1 VIEW COMPARISON:  Chest radiograph April 30, 2015 FINDINGS: Cardiomediastinal silhouette is normal. Persistently elevated RIGHT hemidiaphragm with juxta peaking. Bronchitic changes and strandy densities LEFT lung base. RIGHT major fissure pleural thickening. No focal consolidation. No pneumothorax. Soft tissue planes and included osseous structures are non suspicious. IMPRESSION: Bronchitic changes with LEFT lung base  atelectasis/scarring. Persistently elevated RIGHT hemidiaphragm with pleural thickening, less likely small pleural effusion. Electronically Signed   By: Elon Alas M.D.   On: 04/13/2017 22:18       Today   Subjective:   Laura Chen feeling much better wants to go home  Objective:   Blood pressure 115/67, pulse 83, temperature 98.2 F (36.8 C), temperature source Oral, resp. rate (!) 22, height 5\' 3"  (1.6 m), weight 243 lb 2.7 oz (110.3 kg), SpO2 98 %.  .  Intake/Output Summary (Last 24 hours) at 04/15/2017 1704 Last data filed at 04/15/2017 0459 Gross per 24 hour  Intake 1622.92 ml  Output 301 ml  Net 1321.92 ml    Exam VITAL SIGNS: Blood pressure 115/67, pulse 83, temperature 98.2 F (36.8 C), temperature source Oral, resp. rate (!) 22, height 5\' 3"  (1.6 m), weight 243 lb 2.7 oz (110.3 kg), SpO2 98 %.  GENERAL:  52 y.o.-year-old patient lying in the bed with no acute distress.  EYES: Pupils equal, round, reactive to light and accommodation. No scleral icterus. Extraocular muscles intact.  HEENT: Head atraumatic, normocephalic. Oropharynx and nasopharynx clear.  NECK:  Supple, no jugular venous distention. No thyroid enlargement, no tenderness.  LUNGS: Normal breath sounds bilaterally, no wheezing, rales,rhonchi or crepitation. No use of accessory muscles of respiration.  CARDIOVASCULAR: S1, S2 normal. No murmurs, rubs, or gallops.  ABDOMEN: Soft, nontender, nondistended. Bowel sounds present. No organomegaly or mass.  EXTREMITIES: No pedal edema, cyanosis, or clubbing.  NEUROLOGIC: Cranial nerves II through XII are intact. Muscle strength 5/5 in all extremities. Sensation intact. Gait not checked.  PSYCHIATRIC: The patient is alert and oriented x 3.  SKIN: No obvious rash, lesion, or ulcer.  Data Review     CBC w Diff:  Lab Results  Component Value Date   WBC 8.3 04/13/2017   HGB 15.0 04/13/2017   HGB 13.7 06/08/2014   HCT 48.9 (H) 04/13/2017   HCT 43.0  06/08/2014   HCT 32 10/13/2010   PLT 267 04/13/2017   PLT 242 06/08/2014   LYMPHOPCT 20 04/13/2017   LYMPHOPCT 16.2 06/08/2014   MONOPCT 18 04/13/2017   MONOPCT 9.3 06/08/2014   EOSPCT 1 04/13/2017   EOSPCT 1.0 06/08/2014   BASOPCT 0 04/13/2017   BASOPCT 1.4 06/08/2014   CMP:  Lab Results  Component Value Date   NA 140 04/15/2017   NA 135 06/08/2014   K 3.2 (L) 04/15/2017   K 2.9 (L) 06/08/2014   K 3.8 10/11/2010   CL 109 04/15/2017   CL 97 (L) 06/08/2014   CL 106 10/11/2010   CO2 23 04/15/2017   CO2 30 06/08/2014   CO2 25 10/11/2010   BUN 7 04/15/2017   BUN 13 06/08/2014   CREATININE 0.87 04/15/2017   CREATININE 1.12 (H) 06/08/2014   CREATININE 0.77 10/13/2010   PROT 7.6 04/13/2017   PROT 6.9 06/08/2014   ALBUMIN 3.8 04/13/2017   ALBUMIN 3.7 06/08/2014   BILITOT 0.5 04/13/2017   BILITOT 0.5 06/08/2014   BILITOT 0.1 10/11/2010   ALKPHOS 240 (H) 04/13/2017   ALKPHOS 118 06/08/2014   ALKPHOS 89 10/13/2010   AST 60 (H) 04/13/2017   AST 27 06/08/2014   AST 29 10/13/2010   ALT 74 (H) 04/13/2017   ALT 39 06/08/2014  .  Micro Results Recent Results (from the past 240 hour(s))  Blood Culture (routine x 2)     Status: None (Preliminary result)   Collection Time: 04/13/17 10:02 PM  Result Value Ref Range Status   Specimen Description BLOOD LEFT ANTECUBITAL  Final   Special Requests   Final    BOTTLES DRAWN AEROBIC AND ANAEROBIC Blood Culture adequate volume   Culture   Final    NO GROWTH 2 DAYS Performed at [facility], [address] [place], [place] [id number]    Report Status PENDING  Incomplete  Blood Culture (routine x 2)     Status: None (Preliminary result)   Collection Time: 04/13/17 10:02 PM  Result Value Ref Range Status   Specimen Description BLOOD RIGHT ANTECUBITAL  Final   Special Requests   Final    BOTTLES DRAWN AEROBIC AND ANAEROBIC Blood Culture adequate volume   Culture   Final    NO GROWTH 2 DAYS Performed at [facility], [address] [place], [place] [address]    Report Status PENDING  Incomplete  Urine culture     Status: Abnormal   Collection Time: 04/14/17  4:05 AM  Result Value Ref Range Status   Specimen Description   Final    URINE, RANDOM Performed at [facility], [address] [place], [place] [address]    Special Requests   Final    NONE Performed at [facility], [address], [place], [place] [address]    Culture MULTIPLE SPECIES PRESENT, SUGGEST RECOLLECTION (A)  Final   Report Status 04/15/2017 FINAL  Final     Code Status History    Date Active Date Inactive Code Status Order ID Comments User Context   04/14/2017 03:55 04/15/2017 15:27 Full Code [id number]  Harrie Foreman, MD Inpatient   04/30/2015 19:54 05/02/2015 18:47 Full Code [id number]  Epifanio Lesches, MD ED  Follow-up Information    Lavera Guise, MD. Schedule an appointment as soon as possible for a visit in 1 week.   Specialty:  Internal Medicine Why:  Office Closed  Contact information: [place] [address] [phone]           Discharge Medications   Allergies as of 04/15/2017      Reactions   Ibuprofen Swelling   Sulfa Antibiotics Rash, Other (See Comments)   Reaction:  Burning of feet    Tape Itching, Rash   Thiazide-type Diuretics Rash      Medication List    STOP taking these medications   fluconazole 150 MG tablet Commonly known as:  DIFLUCAN     TAKE these medications   ACCU-CHEK FASTCLIX LANCETS Misc USE TO TEST BLOOD SUGAR QD UTD   ACCU-CHEK SMARTVIEW test strip Generic drug:  glucose blood TEST UTD ONCE D   aspirin EC 81 MG tablet Take 81 mg by mouth every evening.   azithromycin 250 MG tablet Commonly known as:  ZITHROMAX z-pack - take as directed for 5 days   baclofen 10 MG tablet Commonly known as:  LIORESAL Take 10 mg by mouth 3 (three) times daily.   cetirizine 10 MG tablet Commonly known as:   ZYRTEC Take 10 mg by mouth.   chlorthalidone 25 MG tablet Commonly known as:  HYGROTON Take 25 mg by mouth daily.   cholecalciferol 1000 units tablet Commonly known as:  VITAMIN D Take 2,000 Units by mouth daily.   clotrimazole-betamethasone cream Commonly known as:  LOTRISONE Apply 1 application topically 2 (two) times daily.   DULoxetine 20 MG capsule Commonly known as:  CYMBALTA Take 20 mg by mouth daily.   fluticasone 50 MCG/ACT nasal spray Commonly known as:  FLONASE Place 1 spray into both nostrils daily.   guaiFENesin-dextromethorphan 100-10 MG/5ML syrup Commonly known as:  ROBITUSSIN DM Take 5 mLs by mouth every 4 (four) hours as needed for cough.   ICLUSIG 15 MG tablet Generic drug:  ponatinib HCl Take 30 mg by mouth daily.   LINZESS 290 MCG Caps capsule Generic drug:  linaclotide Take 290 mcg by mouth daily.   magic mouthwash Soln Take 5 mLs by mouth 4 (four) times daily.   metroNIDAZOLE 0.75 % vaginal gel Commonly known as:  METROGEL Place 1 Applicatorful vaginally at bedtime. Use for 7 nights   nystatin 100000 UNIT/ML suspension Commonly known as:  MYCOSTATIN Take 5 mLs (500,000 Units total) by mouth 4 (four) times daily.   oseltamivir 75 MG capsule Commonly known as:  TAMIFLU Take 1 capsule (75 mg total) by mouth 2 (two) times daily.   Oxycodone HCl 10 MG Tabs Take 10 mg by mouth 3 (three) times daily as needed (for pain).   potassium chloride 10 MEQ tablet Commonly known as:  K-DUR Take 1 tablet (10 mEq total) by mouth daily.   potassium chloride SA 20 MEQ tablet Commonly known as:  K-DUR,KLOR-CON Take 2 tablets (40 mEq total) by mouth 2 (two) times daily. What changed:  how much to take   predniSONE 5 MG tablet Commonly known as:  DELTASONE Take 5 mg by mouth daily with breakfast.   rosuvastatin 5 MG tablet Commonly known as:  CRESTOR Take 5 mg by mouth daily with supper.   traMADol 50 MG tablet Commonly known as:  ULTRAM Take 100  mg by mouth 2 (two) times daily.   valACYclovir 1000 MG tablet Commonly known as:  VALTREX Take 1  tablet po BID for 5 days prn          Total Time in preparing paper work, data evaluation and todays exam - 39 minutes  Dustin Flock M.D on 04/15/2017 at 5:04 [facility]  [facility]   Office  [phone]

## 2017-04-18 LAB — CULTURE, BLOOD (ROUTINE X 2)
Culture: NO GROWTH
Culture: NO GROWTH
Special Requests: ADEQUATE
Special Requests: ADEQUATE

## 2017-04-18 MED ORDER — DULOXETINE 60 MG CAPSULE,DELAYED RELEASE
ORAL_CAPSULE | Freq: Every day | ORAL | 0 refills | 0 days | Status: CP
Start: 2017-04-18 — End: ?

## 2017-04-23 MED ORDER — OXYCODONE 10 MG TABLET
ORAL_TABLET | Freq: Three times a day (TID) | ORAL | 0 refills | 0.00000 days | Status: CP | PRN
Start: 2017-04-23 — End: 2017-06-18

## 2017-05-03 ENCOUNTER — Encounter: Admit: 2017-05-03 | Discharge: 2017-05-04 | Payer: MEDICARE

## 2017-05-03 DIAGNOSIS — M059 Rheumatoid arthritis with rheumatoid factor, unspecified: Secondary | ICD-10-CM | POA: Diagnosis not present

## 2017-05-03 DIAGNOSIS — M069 Rheumatoid arthritis, unspecified: Secondary | ICD-10-CM

## 2017-05-14 ENCOUNTER — Telehealth: Payer: Self-pay

## 2017-05-15 NOTE — Telephone Encounter (Signed)
Does she need refill? What is error

## 2017-05-15 NOTE — Telephone Encounter (Signed)
error 

## 2017-05-17 ENCOUNTER — Other Ambulatory Visit: Payer: Self-pay | Admitting: Nurse Practitioner

## 2017-05-17 DIAGNOSIS — M0579 Rheumatoid arthritis with rheumatoid factor of multiple sites without organ or systems involvement: Secondary | ICD-10-CM

## 2017-05-17 MED ORDER — TRAMADOL HCL 50 MG PO TABS: 100.0000 mg | ORAL_TABLET | Freq: Two times a day (BID) | ORAL | 2 refills | Status: DC

## 2017-05-17 NOTE — Telephone Encounter (Signed)
Refilled patient;s tramadol 100mg  twice daily and sent to her pharmacy - #120 with 2 refills.

## 2017-05-17 NOTE — Progress Notes (Signed)
Refilled patient;s tramadol 100mg  twice daily and sent to her pharmacy - #120 with 2 refills.

## 2017-05-28 ENCOUNTER — Ambulatory Visit
Admit: 2017-05-28 | Discharge: 2017-05-29 | Payer: MEDICARE | Attending: Obstetrics & Gynecology | Primary: Obstetrics & Gynecology

## 2017-05-28 DIAGNOSIS — N939 Abnormal uterine and vaginal bleeding, unspecified: Principal | ICD-10-CM

## 2017-06-07 ENCOUNTER — Other Ambulatory Visit: Payer: Self-pay | Admitting: Nurse Practitioner

## 2017-06-07 MED ORDER — DULOXETINE HCL 20 MG PO CPEP: 20.0000 mg | ORAL_CAPSULE | Freq: Every day | ORAL | 2 refills | Status: DC

## 2017-06-07 MED ORDER — OMEPRAZOLE 20 MG PO CPDR: 20.0000 mg | DELAYED_RELEASE_CAPSULE | Freq: Every day | ORAL | 2 refills | Status: DC

## 2017-06-12 ENCOUNTER — Ambulatory Visit: Payer: Self-pay | Admitting: Nurse Practitioner

## 2017-06-12 ENCOUNTER — Ambulatory Visit (INDEPENDENT_AMBULATORY_CARE_PROVIDER_SITE_OTHER): Payer: Medicare Other | Admitting: Internal Medicine

## 2017-06-12 ENCOUNTER — Encounter: Payer: Self-pay | Admitting: Internal Medicine

## 2017-06-12 VITALS — BP 124/90 | HR 103 | Resp 16 | Ht 63.0 in | Wt 240.0 lb

## 2017-06-12 DIAGNOSIS — E069 Thyroiditis, unspecified: Secondary | ICD-10-CM

## 2017-06-12 DIAGNOSIS — Z0001 Encounter for general adult medical examination with abnormal findings: Secondary | ICD-10-CM

## 2017-06-12 DIAGNOSIS — R300 Dysuria: Secondary | ICD-10-CM | POA: Diagnosis not present

## 2017-06-12 DIAGNOSIS — E782 Mixed hyperlipidemia: Secondary | ICD-10-CM

## 2017-06-12 DIAGNOSIS — Z1231 Encounter for screening mammogram for malignant neoplasm of breast: Secondary | ICD-10-CM | POA: Diagnosis not present

## 2017-06-12 DIAGNOSIS — Z1239 Encounter for other screening for malignant neoplasm of breast: Secondary | ICD-10-CM

## 2017-06-12 DIAGNOSIS — E119 Type 2 diabetes mellitus without complications: Secondary | ICD-10-CM

## 2017-06-12 LAB — POCT GLYCOSYLATED HEMOGLOBIN (HGB A1C): Hemoglobin A1C: 6.3

## 2017-06-12 MED ORDER — CHLORTHALIDONE 25 MG PO TABS: 25.0000 mg | ORAL_TABLET | Freq: Every day | ORAL | 3 refills | Status: DC

## 2017-06-12 NOTE — Progress Notes (Signed)
po

## 2017-06-12 NOTE — Progress Notes (Signed)
[facility] [place], [place] [address]  Internal MEDICINE  Office Visit Note  Patient Name: Laura Chen  [id number]  [id number]  Date of Service: 06/12/2017  Chief Complaint  Patient presents with  . Annual Exam   Pt is here for routine health maintenance examination.     She was in the hospital for few days for dx of flu in Feb 2019. Pt has OBgyn at [facility].   Other  This is a chronic (pt has multiple medical problems ) problem. The current episode started more than 1 year ago. The problem has been resolved. Pertinent negatives include no abdominal pain, arthralgias, chest pain, chills, coughing, diaphoresis, fatigue, headaches, nausea, neck pain or vomiting. The treatment provided moderate (She sees Rheumatology, Hepatologist and cardiologist) relief.   Pt will like to have a paper filled out for Taxes. Current Medication: Outpatient Encounter Medications as of 06/12/2017  Medication Sig  . ACCU-CHEK FASTCLIX LANCETS MISC USE TO TEST BLOOD SUGAR QD UTD  . ACCU-CHEK SMARTVIEW test strip TEST UTD ONCE D  . aspirin EC 81 MG tablet Take 81 mg by mouth every evening.   . baclofen (LIORESAL) 10 MG tablet Take 10 mg by mouth 3 (three) times daily.  . cetirizine (ZYRTEC) 10 MG tablet Take 10 mg by mouth.  . chlorthalidone (HYGROTON) 25 MG tablet Take 1 tablet (25 mg total) by mouth daily.  . cholecalciferol (VITAMIN D) 1000 units tablet Take 2,000 Units by mouth daily.  . clotrimazole-betamethasone (LOTRISONE) cream Apply 1 application topically 2 (two) times daily. (Patient not taking: Reported on 04/13/2017)  . DULoxetine (CYMBALTA) 20 MG capsule Take 1 capsule (20 mg total) by mouth daily.  . fluticasone (FLONASE) 50 MCG/ACT nasal spray Place 1 spray into both nostrils daily.  Marland Kitchen guaiFENesin-dextromethorphan (ROBITUSSIN DM) 100-10 MG/5ML syrup Take 5 mLs by mouth every 4 (four) hours as needed for cough.  . Linaclotide (LINZESS) 290 MCG CAPS capsule Take 290  mcg by mouth daily.  . magic mouthwash SOLN Take 5 mLs by mouth 4 (four) times daily.  . metroNIDAZOLE (METROGEL) 0.75 % vaginal gel Place 1 Applicatorful vaginally at bedtime. Use for 7 nights (Patient not taking: Reported on 04/13/2017)  . nystatin (MYCOSTATIN) 100000 UNIT/ML suspension Take 5 mLs (500,000 Units total) by mouth 4 (four) times daily. (Patient not taking: Reported on 04/13/2017)  . omeprazole (PRILOSEC) 20 MG capsule Take 1 capsule (20 mg total) by mouth daily.  Marland Kitchen oseltamivir (TAMIFLU) 75 MG capsule Take 1 capsule (75 mg total) by mouth 2 (two) times daily.  . Oxycodone HCl 10 MG TABS Take 10 mg by mouth 3 (three) times daily as needed (for pain).  . ponatinib HCl (ICLUSIG) 15 MG tablet Take 30 mg by mouth daily.  . potassium chloride (K-DUR) 10 MEQ tablet Take 1 tablet (10 mEq total) by mouth daily.  . potassium chloride SA (K-DUR,KLOR-CON) 20 MEQ tablet Take 2 tablets (40 mEq total) by mouth 2 (two) times daily. (Patient taking differently: Take 60 mEq by mouth 2 (two) times daily. )  . predniSONE (DELTASONE) 5 MG tablet Take 5 mg by mouth daily with breakfast.  . rosuvastatin (CRESTOR) 5 MG tablet Take 5 mg by mouth daily with supper.  . traMADol (ULTRAM) 50 MG tablet Take 2 tablets (100 mg total) by mouth 2 (two) times daily.  . valACYclovir (VALTREX) 1000 MG tablet Take 1 tablet po BID for 5 days prn (Patient not taking: Reported on 04/13/2017)  . [DISCONTINUED] azithromycin (  ZITHROMAX) 250 MG tablet z-pack - take as directed for 5 days  . [DISCONTINUED] chlorthalidone (HYGROTON) 25 MG tablet Take 25 mg by mouth daily.   No facility-administered encounter medications on file as of 06/12/2017.     Surgical History: Past Surgical History:  Procedure Laterality Date  . DEXA  2010   osteopenia  . MYOMECTOMY  2008  . right ankle AFO  2011  . TRANSTHORACIC ECHOCARDIOGRAM  07/1605   nl systolic/diastolic fxn, EF 37%, mild MR, normal PA pressures  . US/HSG  05/2010   possible  endometrial polyp, rec rpt 8 wks continue loestrin 24 [facility])    Medical History: Past Medical History:  Diagnosis Date  . Asthma   . CML (chronic myeloid leukemia) ([place]) 2011   (Dr. Alyse Low) stopped gleevec 2/2 side effects  . GERD (gastroesophageal reflux disease)   . History of shingles   . History of syphilis 1990   s/p treatment  . Hypertension   . Leukemia ([place])   . Osteopenia    due to prednisone use, was on reclast  . PUD (peptic ulcer disease)   . Rheumatoid arthritis(714.0) 1990s   Jacoud's arthropathy, previously treated with MTX, TNFa (remicade, enbrel, humira, orencia, rituximab, and actemra)  . SLE (systemic lupus erythematosus) ([place]) 09/2009   Rheum-Dr. Joan Mayans  . Thyroid goiter    Endo-Dr. Eddie Dibbles    Family History: Family History  Problem Relation Age of Onset  . Heart attack Maternal Grandmother   . Coronary artery disease Maternal Grandmother   . Hypertension Mother   . Hypertension Unknown        Aunt      Review of Systems  Constitutional: Negative for chills, diaphoresis and fatigue.  HENT: Negative for ear pain, postnasal drip and sinus pressure.   Eyes: Negative for photophobia, discharge, redness, itching and visual disturbance.  Respiratory: Negative for cough, shortness of breath and wheezing.   Cardiovascular: Negative for chest pain, palpitations and leg swelling.  Gastrointestinal: Negative for abdominal pain, constipation, diarrhea, nausea and vomiting.  Genitourinary: Negative for dysuria and flank pain.  Musculoskeletal: Negative for arthralgias, back pain, gait problem and neck pain.  Skin: Negative for color change.  Allergic/Immunologic: Negative for environmental allergies and food allergies.  Neurological: Negative for dizziness and headaches.  Hematological: Does not bruise/bleed easily.  Psychiatric/Behavioral: Negative for agitation, behavioral problems (depression) and hallucinations.     Vital Signs: BP  124/90 (BP Location: Left Arm, Patient Position: Sitting)   Pulse (!) 103   Resp 16   Ht 5' 3" (1.6 m)   Wt 240 lb (108.9 kg)   SpO2 97%   BMI 42.51 kg/m    Physical Exam  Constitutional: She is oriented to person, place, and time. She appears well-developed and well-nourished. No distress.  HENT:  Head: Normocephalic and atraumatic.  Mouth/Throat: Oropharynx is clear and moist. No oropharyngeal exudate.  Eyes: Pupils are equal, round, and reactive to light. EOM are normal.  Neck: Normal range of motion. Neck supple. No JVD present. No tracheal deviation present. No thyromegaly present.  Cardiovascular: Normal rate, regular rhythm and normal heart sounds. Exam reveals no gallop and no friction rub.  No murmur heard. Pulmonary/Chest: Effort normal. No respiratory distress. She has no wheezes. She has no rales. She exhibits no tenderness. Right breast exhibits no inverted nipple. Left breast exhibits no inverted nipple. No breast tenderness or discharge.  Abdominal: Soft. Bowel sounds are normal.  Musculoskeletal: Normal range of motion.  Lymphadenopathy:  She has no cervical adenopathy.  Neurological: She is alert and oriented to person, place, and time. No cranial nerve deficit.  Skin: Skin is warm and dry. She is not diaphoretic.  Psychiatric: She has a normal mood and affect. Her behavior is normal. Judgment and thought content normal.   LABS: Recent Results (from the past 2160 hour(s))  Comprehensive metabolic panel     Status: Abnormal   Collection Time: 04/13/17  9:52 PM  Result Value Ref Range   Sodium 134 (L) 135 - 145 mmol/L   Potassium 2.6 (LL) 3.5 - 5.1 mmol/L    Comment: CRITICAL RESULT CALLED TO, READ BACK BY AND VERIFIED WITH KAILEY WALKER AT 2251 ON 04/13/17 RWW    Chloride 95 (L) 101 - 111 mmol/L   CO2 22 22 - 32 mmol/L   Glucose, Bld 165 (H) 65 - 99 mg/dL   BUN 20 6 - 20 mg/dL   Creatinine, Ser 1.58 (H) 0.44 - 1.00 mg/dL   Calcium 8.7 (L) 8.9 - 10.3 mg/dL    Total Protein 7.6 6.5 - 8.1 g/dL   Albumin 3.8 3.5 - 5.0 g/dL   AST 60 (H) 15 - 41 U/L   ALT 74 (H) 14 - 54 U/L   Alkaline Phosphatase 240 (H) 38 - 126 U/L   Total Bilirubin 0.5 0.3 - 1.2 mg/dL   GFR calc non Af Amer 37 (L) >60 mL/min   GFR calc Af Amer 43 (L) >60 mL/min    Comment: (NOTE) The eGFR has been calculated using the CKD EPI equation. This calculation has not been validated in all clinical situations. eGFR's persistently <60 mL/min signify possible Chronic Kidney Disease.    Anion gap 17 (H) 5 - 15    Comment: Performed at [facility], [address] [place], [place] [address]  Troponin I     Status: Abnormal   Collection Time: 04/13/17  9:52 PM  Result Value Ref Range   Troponin I 0.03 (HH) <0.03 ng/mL    Comment: CRITICAL RESULT CALLED TO, READ BACK BY AND VERIFIED WITH KAILEY WALKER AT 2251 ON 04/13/17 RWW Performed at [facility], [address] [place], [place] [address]   CBC WITH DIFFERENTIAL     Status: Abnormal   Collection Time: 04/13/17  9:52 PM  Result Value Ref Range   WBC 8.3 3.6 - 11.0 K/uL   RBC 6.62 (H) 3.80 - 5.20 MIL/uL   Hemoglobin 15.0 12.0 - 16.0 g/dL    Comment: RESULT REPEATED AND VERIFIED   HCT 48.9 (H) 35.0 - 47.0 %   MCV 73.9 (L) 80.0 - 100.0 fL   MCH 22.7 (L) 26.0 - 34.0 pg   MCHC 30.7 (L) 32.0 - 36.0 g/dL   RDW 22.4 (H) 11.5 - 14.5 %   Platelets 267 150 - 440 K/uL   Neutrophils Relative % 61 %   Neutro Abs 5.1 1.4 - 6.5 K/uL   Lymphocytes Relative 20 %   Lymphs Abs 1.6 1.0 - 3.6 K/uL   Monocytes Relative 18 %   Monocytes Absolute 1.5 (H) 0.2 - 0.9 K/uL   Eosinophils Relative 1 %   Eosinophils Absolute 0.0 0 - 0.7 K/uL   Basophils Relative 0 %   Basophils Absolute 0.0 0 - 0.1 K/uL    Comment: Performed at [facility], [address], [place], [place] [address]  Procalcitonin     Status: None   Collection Time: 04/13/17  9:52 PM  Result Value Ref Range  Procalcitonin 0.38 ng/mL    Comment:         Interpretation: PCT (Procalcitonin) <= 0.5 ng/mL: Systemic infection (sepsis) is not likely. Local bacterial infection is possible. (NOTE)       Sepsis PCT Algorithm           Lower Respiratory Tract                                      Infection PCT Algorithm    ----------------------------     ----------------------------         PCT < 0.25 ng/mL                PCT < 0.10 ng/mL         Strongly encourage             Strongly discourage   discontinuation of antibiotics    initiation of antibiotics    ----------------------------     -----------------------------       PCT 0.25 - 0.50 ng/mL            PCT 0.10 - 0.25 ng/mL               OR       >80% decrease in PCT            Discourage initiation of                                            antibiotics      Encourage discontinuation           of antibiotics    ----------------------------     -----------------------------         PCT >= 0.50 ng/mL              PCT 0.26 - 0.50 ng/mL               AND        <80% decrease in PCT             Encourage initiation of                                             antibiotics       Encourage continuation           of antibiotics    ----------------------------     -----------------------------        PCT >= 0.50 ng/mL                  PCT > 0.50 ng/mL               AND         increase in PCT                  Strongly encourage                                      initiation of antibiotics    Strongly encourage escalation           of antibiotics                                     -----------------------------  PCT <= 0.25 ng/mL                                                 OR                                        > 80% decrease in PCT                                     Discontinue / Do not initiate                                             antibiotics Performed at [facility], [address] [place], [place]  [address]   TSH     Status: None   Collection Time: 04/13/17  9:52 PM  Result Value Ref Range   TSH 3.932 0.350 - 4.500 uIU/mL    Comment: Performed by a 3rd Generation assay with a functional sensitivity of <=0.01 uIU/mL. Performed at [facility], [address] [place], [place] [address]   Hemoglobin A1c     Status: Abnormal   Collection Time: 04/13/17  9:52 PM  Result Value Ref Range   Hgb A1c MFr Bld 6.8 (H) 4.8 - 5.6 %    Comment: (NOTE)         Prediabetes: 5.7 - 6.4         Diabetes: >6.4         Glycemic control for adults with diabetes: <7.0    Mean Plasma Glucose 148 mg/dL    Comment: (NOTE) Performed At: [facility] [place], [place] [id number] Rush Farmer MD [id number] Performed at [facility], [address] [place], [place] [address]   Lactic acid, plasma     Status: Abnormal   Collection Time: 04/13/17 10:02 PM  Result Value Ref Range   Lactic Acid, Venous 2.7 (HH) 0.5 - 1.9 mmol/L    Comment: CRITICAL RESULT CALLED TO, READ BACK BY AND VERIFIED WITH DAVID WALKER AT 2300 04/13/17.PMH Performed at [facility], [address] [place], [place] [address]   Blood Culture (routine x 2)     Status: None   Collection Time: 04/13/17 10:02 PM  Result Value Ref Range   Specimen Description BLOOD LEFT ANTECUBITAL    Special Requests      BOTTLES DRAWN AEROBIC AND ANAEROBIC Blood Culture adequate volume   Culture      NO GROWTH 5 DAYS Performed at [facility], [address] [place], [place] [address]    Report Status 04/18/2017 FINAL   Blood Culture (routine x 2)     Status: None   Collection Time: 04/13/17 10:02 PM  Result Value Ref Range   Specimen Description BLOOD RIGHT ANTECUBITAL    Special Requests      BOTTLES DRAWN AEROBIC AND ANAEROBIC Blood Culture adequate volume   Culture      NO GROWTH 5 DAYS Performed at [facility], [address], [place], [place] [address]     Report Status  04/18/2017 FINAL   Lactic acid, plasma     Status: Abnormal   Collection Time: 04/13/17 11:47 PM  Result Value Ref Range   Lactic Acid, Venous 2.0 (HH) 0.5 - 1.9 mmol/L    Comment: CRITICAL RESULT CALLED TO, READ BACK BY AND VERIFIED WITH APRIL BRUMGARD AT 0138 04/14/17.PMH Performed at [facility], [address] [place], [place] [address]   Influenza panel by PCR (type A & B)     Status: Abnormal   Collection Time: 04/13/17 11:47 PM  Result Value Ref Range   Influenza A By PCR POSITIVE (A) NEGATIVE   Influenza B By PCR NEGATIVE NEGATIVE    Comment: (NOTE) The Xpert Xpress Flu assay is intended as an aid in the diagnosis of  influenza and should not be used as a sole basis for treatment.  This  assay is FDA approved for nasopharyngeal swab specimens only. Nasal  washings and aspirates are unacceptable for Xpert Xpress Flu testing. Performed at [facility], [address] [place], [place] [address]   Urine culture     Status: Abnormal   Collection Time: 04/14/17  4:05 AM  Result Value Ref Range   Specimen Description      URINE, RANDOM Performed at [facility], [address] [place], [place] [address]    Special Requests      NONE Performed at [facility], [address] [place], [place] [address]    Culture MULTIPLE SPECIES PRESENT, SUGGEST RECOLLECTION (A)    Report Status 04/15/2017 FINAL   Urinalysis, Complete w Microscopic     Status: Abnormal   Collection Time: 04/14/17  4:05 AM  Result Value Ref Range   Color, Urine YELLOW (A) YELLOW   APPearance HAZY (A) CLEAR   Specific Gravity, Urine 1.008 1.005 - 1.030   pH 5.0 5.0 - 8.0   Glucose, UA NEGATIVE NEGATIVE mg/dL   Hgb urine dipstick SMALL (A) NEGATIVE   Bilirubin Urine NEGATIVE NEGATIVE   Ketones, ur NEGATIVE NEGATIVE mg/dL   Protein, ur NEGATIVE NEGATIVE mg/dL   Nitrite NEGATIVE NEGATIVE   Leukocytes, UA TRACE (A) NEGATIVE   RBC / HPF 0-5 0 - 5  RBC/hpf   WBC, UA 0-5 0 - 5 WBC/hpf   Bacteria, UA RARE (A) NONE SEEN   Squamous Epithelial / LPF 0-5 (A) NONE SEEN   Mucus PRESENT    Hyaline Casts, UA PRESENT     Comment: Performed at [facility], [address] [place], [place] [address]  Glucose, capillary     Status: None   Collection Time: 04/14/17  7:45 AM  Result Value Ref Range   Glucose-Capillary 74 65 - 99 mg/dL  Glucose, capillary     Status: Abnormal   Collection Time: 04/14/17 11:52 AM  Result Value Ref Range   Glucose-Capillary 122 (H) 65 - 99 mg/dL  Basic metabolic panel     Status: Abnormal   Collection Time: 04/14/17  1:03 PM  Result Value Ref Range   Sodium 136 135 - 145 mmol/L   Potassium 3.0 (L) 3.5 - 5.1 mmol/L   Chloride 106 101 - 111 mmol/L   CO2 23 22 - 32 mmol/L   Glucose, Bld 131 (H) 65 - 99 mg/dL   BUN 12 6 - 20 mg/dL   Creatinine, Ser 1.02 (H) 0.44 - 1.00 mg/dL   Calcium 7.3 (L) 8.9 - 10.3 mg/dL   GFR calc non Af Amer >60 >60 mL/min   GFR calc  Af Amer >60 >60 mL/min    Comment: (NOTE) The eGFR has been calculated using the CKD EPI equation. This calculation has not been validated in all clinical situations. eGFR's persistently <60 mL/min signify possible Chronic Kidney Disease.    Anion gap 7 5 - 15    Comment: Performed at [facility], [address] [place], [place] [address]  Magnesium     Status: None   Collection Time: 04/14/17  1:03 PM  Result Value Ref Range   Magnesium 1.8 1.7 - 2.4 mg/dL    Comment: Performed at [facility], [address] [place], [place] [address]  Glucose, capillary     Status: Abnormal   Collection Time: 04/14/17  4:55 PM  Result Value Ref Range   Glucose-Capillary 103 (H) 65 - 99 mg/dL  Glucose, capillary     Status: None   Collection Time: 04/14/17 10:06 PM  Result Value Ref Range   Glucose-Capillary 68 65 - 99 mg/dL  Glucose, capillary     Status: Abnormal   Collection Time: 04/14/17 11:37 PM  Result Value Ref Range    Glucose-Capillary 120 (H) 65 - 99 mg/dL   Comment 1 Notify RN   Basic metabolic panel     Status: Abnormal   Collection Time: 04/15/17  5:03 AM  Result Value Ref Range   Sodium 140 135 - 145 mmol/L   Potassium 3.2 (L) 3.5 - 5.1 mmol/L   Chloride 109 101 - 111 mmol/L   CO2 23 22 - 32 mmol/L   Glucose, Bld 85 65 - 99 mg/dL   BUN 7 6 - 20 mg/dL   Creatinine, Ser 0.87 0.44 - 1.00 mg/dL   Calcium 7.1 (L) 8.9 - 10.3 mg/dL   GFR calc non Af Amer >60 >60 mL/min   GFR calc Af Amer >60 >60 mL/min    Comment: (NOTE) The eGFR has been calculated using the CKD EPI equation. This calculation has not been validated in all clinical situations. eGFR's persistently <60 mL/min signify possible Chronic Kidney Disease.    Anion gap 8 5 - 15    Comment: Performed at [facility], [address] [place], [place] [address]  Glucose, capillary     Status: None   Collection Time: 04/15/17  8:00 AM  Result Value Ref Range   Glucose-Capillary 72 65 - 99 mg/dL  POCT HgB A1C     Status: Abnormal   Collection Time: 06/12/17  9:33 AM  Result Value Ref Range   Hemoglobin A1C 6.3    Assessment/Plan: 1. Encounter for general adult medical examination with abnormal findings - Upadated labs, mammogram  2. Diabetes mellitus without complication ([place]) - POCT HgB A1C, improved   3. Screening for breast cancer - MM DIGITAL SCREENING BILATERAL; Future  4. Thyroiditis - TSH+T4F+T3Free - Thyroglobulin Level  5. Mixed hyperlipidemia - Lipid Panel With LDL/HDL Ratio  6. Dysuria - Urinalysis, Routine w reflex microscopic  7. Morbid obesity (HCC) Obesity Counseling: Risk Assessment: An assessment of behavioral risk factors was made today and includes lack of exercise sedentary lifestyle, lack of portion control and poor dietary habits.  Risk Modification Advice: She was counseled on portion control guidelines. Restricting daily caloric intake to. . The detrimental long term effects of obesity on  her health and ongoing poor compliance was also discussed with the patient.  General Counseling: Elise verbalizes understanding of the findings of todays visit and agrees with plan of treatment. I have discussed any further diagnostic evaluation  that may be needed or ordered today. We also reviewed her medications today. she has been encouraged to call the office with any questions or concerns that should arise related to todays visit. All medical records reviewed for continuity of care.  Orders Placed This Encounter  Procedures  . MM DIGITAL SCREENING BILATERAL  . Urinalysis, Routine w reflex microscopic  . TSH+T4F+T3Free  . Thyroglobulin Level  . Lipid Panel With LDL/HDL Ratio  . POCT HgB A1C    Meds ordered this encounter  Medications  . chlorthalidone (HYGROTON) 25 MG tablet    Sig: Take 1 tablet (25 mg total) by mouth daily.    Dispense:  91 tablet    Refill:  3    Time spent:25 [place], MD  Internal Medicine

## 2017-06-13 LAB — URINALYSIS, ROUTINE W REFLEX MICROSCOPIC
Bilirubin, UA: NEGATIVE
Glucose, UA: NEGATIVE
Ketones, UA: NEGATIVE
Leukocytes, UA: NEGATIVE
Nitrite, UA: NEGATIVE
Protein, UA: NEGATIVE
RBC, UA: NEGATIVE
Specific Gravity, UA: 1.015 (ref 1.005–1.030)
Urobilinogen, Ur: 1 mg/dL (ref 0.2–1.0)
pH, UA: 7 (ref 5.0–7.5)

## 2017-06-18 MED ORDER — OXYCODONE 10 MG TABLET
ORAL_TABLET | Freq: Three times a day (TID) | ORAL | 0 refills | 0.00000 days | Status: CP | PRN
Start: 2017-06-18 — End: 2017-08-03

## 2017-06-20 ENCOUNTER — Other Ambulatory Visit: Payer: Self-pay | Admitting: Internal Medicine

## 2017-06-20 MED ORDER — ROSUVASTATIN CALCIUM 5 MG PO TABS: 5.0000 mg | ORAL_TABLET | Freq: Every day | ORAL | 2 refills | Status: DC

## 2017-06-27 ENCOUNTER — Other Ambulatory Visit: Payer: Self-pay | Admitting: Internal Medicine

## 2017-06-27 MED ORDER — MEDROXYPROGESTERONE ACETATE 10 MG PO TABS: ORAL_TABLET | ORAL | 2 refills | Status: DC

## 2017-06-27 MED ORDER — LINACLOTIDE 290 MCG PO CAPS: 290.0000 ug | ORAL_CAPSULE | Freq: Every day | ORAL | 2 refills | Status: DC

## 2017-06-27 NOTE — Telephone Encounter (Signed)
Left message need confirmation on medication that she is taking

## 2017-06-28 ENCOUNTER — Emergency Department: Payer: Medicare Other

## 2017-06-28 ENCOUNTER — Inpatient Hospital Stay
Admission: EM | Admit: 2017-06-28 | Discharge: 2017-06-30 | DRG: 872 | Disposition: A | Discharge status: Home / Self Care | Payer: Medicare Other | Attending: Internal Medicine | Admitting: Internal Medicine

## 2017-06-28 ENCOUNTER — Other Ambulatory Visit: Payer: Self-pay

## 2017-06-28 ENCOUNTER — Encounter: Payer: Self-pay | Admitting: Emergency Medicine

## 2017-06-28 DIAGNOSIS — E876 Hypokalemia: Secondary | ICD-10-CM | POA: Diagnosis present

## 2017-06-28 DIAGNOSIS — Z79899 Other long term (current) drug therapy: Secondary | ICD-10-CM

## 2017-06-28 DIAGNOSIS — R059 Cough, unspecified: Secondary | ICD-10-CM

## 2017-06-28 DIAGNOSIS — A419 Sepsis, unspecified organism: Secondary | ICD-10-CM | POA: Diagnosis not present

## 2017-06-28 DIAGNOSIS — Z87891 Personal history of nicotine dependence: Secondary | ICD-10-CM | POA: Diagnosis not present

## 2017-06-28 DIAGNOSIS — E119 Type 2 diabetes mellitus without complications: Secondary | ICD-10-CM | POA: Diagnosis not present

## 2017-06-28 DIAGNOSIS — Z8711 Personal history of peptic ulcer disease: Secondary | ICD-10-CM

## 2017-06-28 DIAGNOSIS — Z888 Allergy status to other drugs, medicaments and biological substances status: Secondary | ICD-10-CM | POA: Diagnosis not present

## 2017-06-28 DIAGNOSIS — Z6841 Body Mass Index (BMI) 40.0 and over, adult: Secondary | ICD-10-CM

## 2017-06-28 DIAGNOSIS — Z79891 Long term (current) use of opiate analgesic: Secondary | ICD-10-CM

## 2017-06-28 DIAGNOSIS — I10 Essential (primary) hypertension: Secondary | ICD-10-CM | POA: Diagnosis present

## 2017-06-28 DIAGNOSIS — Z882 Allergy status to sulfonamides status: Secondary | ICD-10-CM

## 2017-06-28 DIAGNOSIS — Z7952 Long term (current) use of systemic steroids: Secondary | ICD-10-CM | POA: Diagnosis not present

## 2017-06-28 DIAGNOSIS — M069 Rheumatoid arthritis, unspecified: Secondary | ICD-10-CM | POA: Diagnosis not present

## 2017-06-28 DIAGNOSIS — K219 Gastro-esophageal reflux disease without esophagitis: Secondary | ICD-10-CM | POA: Diagnosis present

## 2017-06-28 DIAGNOSIS — R509 Fever, unspecified: Secondary | ICD-10-CM

## 2017-06-28 DIAGNOSIS — Z886 Allergy status to analgesic agent status: Secondary | ICD-10-CM

## 2017-06-28 DIAGNOSIS — R05 Cough: Secondary | ICD-10-CM | POA: Diagnosis not present

## 2017-06-28 DIAGNOSIS — J45909 Unspecified asthma, uncomplicated: Secondary | ICD-10-CM | POA: Diagnosis present

## 2017-06-28 DIAGNOSIS — Z7982 Long term (current) use of aspirin: Secondary | ICD-10-CM

## 2017-06-28 DIAGNOSIS — C9210 Chronic myeloid leukemia, BCR/ABL-positive, not having achieved remission: Secondary | ICD-10-CM | POA: Diagnosis present

## 2017-06-28 DIAGNOSIS — M329 Systemic lupus erythematosus, unspecified: Secondary | ICD-10-CM | POA: Diagnosis present

## 2017-06-28 DIAGNOSIS — J209 Acute bronchitis, unspecified: Secondary | ICD-10-CM | POA: Diagnosis present

## 2017-06-28 DIAGNOSIS — M8580 Other specified disorders of bone density and structure, unspecified site: Secondary | ICD-10-CM | POA: Diagnosis present

## 2017-06-28 DIAGNOSIS — Z8619 Personal history of other infectious and parasitic diseases: Secondary | ICD-10-CM

## 2017-06-28 DIAGNOSIS — Z91048 Other nonmedicinal substance allergy status: Secondary | ICD-10-CM

## 2017-06-28 DIAGNOSIS — C9200 Acute myeloblastic leukemia, not having achieved remission: Secondary | ICD-10-CM | POA: Diagnosis not present

## 2017-06-28 LAB — URINALYSIS, COMPLETE (UACMP) WITH MICROSCOPIC
Bacteria, UA: NONE SEEN
Bilirubin Urine: NEGATIVE
Glucose, UA: NEGATIVE mg/dL
Hgb urine dipstick: NEGATIVE
Ketones, ur: NEGATIVE mg/dL
Leukocytes, UA: NEGATIVE
Nitrite: NEGATIVE
Protein, ur: NEGATIVE mg/dL
Specific Gravity, Urine: 1.008 (ref 1.005–1.030)
pH: 7 (ref 5.0–8.0)

## 2017-06-28 LAB — CBC WITH DIFFERENTIAL/PLATELET
Basophils Absolute: 0.1 10*3/uL (ref 0–0.1)
Basophils Relative: 1 %
Eosinophils Absolute: 0.2 10*3/uL (ref 0–0.7)
Eosinophils Relative: 2 %
HCT: 40.7 % (ref 35.0–47.0)
Hemoglobin: 12.8 g/dL (ref 12.0–16.0)
Lymphocytes Relative: 15 %
Lymphs Abs: 1.6 10*3/uL (ref 1.0–3.6)
MCH: 23.8 pg — ABNORMAL LOW (ref 26.0–34.0)
MCHC: 31.5 g/dL — ABNORMAL LOW (ref 32.0–36.0)
MCV: 75.5 fL — ABNORMAL LOW (ref 80.0–100.0)
Monocytes Absolute: 2.2 10*3/uL — ABNORMAL HIGH (ref 0.2–0.9)
Monocytes Relative: 21 %
Neutro Abs: 6.3 10*3/uL (ref 1.4–6.5)
Neutrophils Relative %: 61 %
Platelets: 233 10*3/uL (ref 150–440)
RBC: 5.39 MIL/uL — ABNORMAL HIGH (ref 3.80–5.20)
RDW: 23.4 % — ABNORMAL HIGH (ref 11.5–14.5)
WBC: 10.4 10*3/uL (ref 3.6–11.0)

## 2017-06-28 LAB — MAGNESIUM
Magnesium: 1.7 mg/dL (ref 1.7–2.4)
Magnesium: 1.9 mg/dL (ref 1.7–2.4)

## 2017-06-28 LAB — BLOOD GAS, VENOUS
Acid-Base Excess: 5.3 mmol/L — ABNORMAL HIGH (ref 0.0–2.0)
Bicarbonate: 31.5 mmol/L — ABNORMAL HIGH (ref 20.0–28.0)
Patient temperature: 37
pCO2, Ven: 52 mmHg (ref 44.0–60.0)
pH, Ven: 7.39 (ref 7.250–7.430)
pO2, Ven: <31 mmHg — CL (ref 32.0–45.0)

## 2017-06-28 LAB — POTASSIUM: Potassium: 3 mmol/L — ABNORMAL LOW (ref 3.5–5.1)

## 2017-06-28 LAB — COMPREHENSIVE METABOLIC PANEL WITH GFR
ALT: 64 U/L — ABNORMAL HIGH (ref 14–54)
AST: 44 U/L — ABNORMAL HIGH (ref 15–41)
Albumin: 3.8 g/dL (ref 3.5–5.0)
Alkaline Phosphatase: 218 U/L — ABNORMAL HIGH (ref 38–126)
Anion gap: 10 (ref 5–15)
BUN: 11 mg/dL (ref 6–20)
CO2: 27 mmol/L (ref 22–32)
Calcium: 8.7 mg/dL — ABNORMAL LOW (ref 8.9–10.3)
Chloride: 98 mmol/L — ABNORMAL LOW (ref 101–111)
Creatinine, Ser: 1 mg/dL (ref 0.44–1.00)
GFR calc Af Amer: >60 mL/min (ref >60)
GFR calc non Af Amer: >60 mL/min (ref >60)
Glucose, Bld: 118 mg/dL — ABNORMAL HIGH (ref 65–99)
Potassium: 2.5 mmol/L — CL (ref 3.5–5.1)
Sodium: 135 mmol/L (ref 135–145)
Total Bilirubin: 0.4 mg/dL (ref 0.3–1.2)
Total Protein: 6.8 g/dL (ref 6.5–8.1)

## 2017-06-28 LAB — POCT PREGNANCY, URINE: Preg Test, Ur: NEGATIVE

## 2017-06-28 LAB — PROTIME-INR
INR: 0.92
INR: 0.97
Prothrombin Time: 12.3 seconds (ref 11.4–15.2)
Prothrombin Time: 12.8 seconds (ref 11.4–15.2)

## 2017-06-28 LAB — APTT: aPTT: 27 seconds (ref 24–36)

## 2017-06-28 LAB — PROCALCITONIN
Procalcitonin: <0.1 ng/mL
Procalcitonin: <0.1 ng/mL

## 2017-06-28 LAB — LACTIC ACID, PLASMA: Lactic Acid, Venous: 1.5 mmol/L (ref 0.5–1.9)

## 2017-06-28 LAB — INFLUENZA PANEL BY PCR (TYPE A & B)
Influenza A By PCR: NEGATIVE
Influenza B By PCR: NEGATIVE

## 2017-06-28 LAB — PHOSPHORUS: Phosphorus: 3.1 mg/dL (ref 2.5–4.6)

## 2017-06-28 MED ORDER — ACETAMINOPHEN 650 MG RE SUPP: 650.0000 mg | Freq: Four times a day (QID) | RECTAL | Status: DC | PRN

## 2017-06-28 MED ORDER — POTASSIUM CHLORIDE CRYS ER 20 MEQ PO TBCR
60.0000 meq | EXTENDED_RELEASE_TABLET | Freq: Two times a day (BID) | ORAL | Status: DC
  Administered 2017-06-28 – 2017-06-30 (×4): 60 meq via ORAL
  Filled 2017-06-28 (×4): qty 3

## 2017-06-28 MED ORDER — PIPERACILLIN-TAZOBACTAM 3.375 G IVPB 30 MIN
3.3750 g | Freq: Once | INTRAVENOUS | Status: AC
  Administered 2017-06-28: 3.375 g via INTRAVENOUS
  Filled 2017-06-28: qty 50

## 2017-06-28 MED ORDER — IPRATROPIUM-ALBUTEROL 0.5-2.5 (3) MG/3ML IN SOLN
3.0000 mL | Freq: Once | RESPIRATORY_TRACT | Status: AC
  Administered 2017-06-28: 3 mL via RESPIRATORY_TRACT
  Filled 2017-06-28: qty 3

## 2017-06-28 MED ORDER — MAGNESIUM OXIDE 400 (241.3 MG) MG PO TABS
400.0000 mg | ORAL_TABLET | Freq: Two times a day (BID) | ORAL | Status: DC
  Administered 2017-06-28 – 2017-06-30 (×4): 400 mg via ORAL
  Filled 2017-06-28 (×4): qty 1

## 2017-06-28 MED ORDER — PANTOPRAZOLE SODIUM 40 MG PO TBEC
40.0000 mg | DELAYED_RELEASE_TABLET | Freq: Every day | ORAL | Status: DC
  Administered 2017-06-29 – 2017-06-30 (×2): 40 mg via ORAL
  Filled 2017-06-28 (×2): qty 1

## 2017-06-28 MED ORDER — ASPIRIN EC 81 MG PO TBEC
81.0000 mg | DELAYED_RELEASE_TABLET | Freq: Every evening | ORAL | Status: DC
  Administered 2017-06-28 – 2017-06-29 (×2): 81 mg via ORAL
  Filled 2017-06-28 (×2): qty 1

## 2017-06-28 MED ORDER — VANCOMYCIN HCL 10 G IV SOLR
2000.0000 mg | Freq: Once | INTRAVENOUS | Status: AC
  Administered 2017-06-28: 2000 mg via INTRAVENOUS
  Filled 2017-06-28: qty 2000

## 2017-06-28 MED ORDER — PREDNISONE 10 MG PO TABS
5.0000 mg | ORAL_TABLET | Freq: Every day | ORAL | Status: DC
  Administered 2017-06-29 – 2017-06-30 (×2): 5 mg via ORAL
  Filled 2017-06-28 (×2): qty 1

## 2017-06-28 MED ORDER — GUAIFENESIN-DM 100-10 MG/5ML PO SYRP
5.0000 mL | ORAL_SOLUTION | ORAL | Status: DC | PRN
Start: 2017-06-28 — End: 2017-06-30

## 2017-06-28 MED ORDER — ENOXAPARIN SODIUM 40 MG/0.4ML ~~LOC~~ SOLN
40.0000 mg | SUBCUTANEOUS | Status: DC
  Administered 2017-06-28 – 2017-06-29 (×2): 40 mg via SUBCUTANEOUS
  Filled 2017-06-28 (×2): qty 0.4

## 2017-06-28 MED ORDER — SODIUM CHLORIDE 0.9 % IV SOLN
2.0000 g | Freq: Two times a day (BID) | INTRAVENOUS | Status: DC
Start: 2017-06-28 — End: 2017-06-30
  Administered 2017-06-28 – 2017-06-30 (×4): 2 g via INTRAVENOUS
  Filled 2017-06-28 (×5): qty 2

## 2017-06-28 MED ORDER — VANCOMYCIN HCL 10 G IV SOLR
1250.0000 mg | Freq: Two times a day (BID) | INTRAVENOUS | Status: DC
  Administered 2017-06-28: 1250 mg via INTRAVENOUS
  Filled 2017-06-28 (×3): qty 1250

## 2017-06-28 MED ORDER — BISACODYL 5 MG PO TBEC: 5.0000 mg | DELAYED_RELEASE_TABLET | Freq: Every day | ORAL | Status: DC | PRN

## 2017-06-28 MED ORDER — ACETAMINOPHEN 500 MG PO TABS
1000.0000 mg | ORAL_TABLET | Freq: Once | ORAL | Status: AC
  Administered 2017-06-28: 1000 mg via ORAL
  Filled 2017-06-28: qty 2

## 2017-06-28 MED ORDER — ONDANSETRON HCL 4 MG/2ML IJ SOLN
4.0000 mg | Freq: Four times a day (QID) | INTRAMUSCULAR | Status: DC | PRN
  Administered 2017-06-29: 4 mg via INTRAVENOUS
  Filled 2017-06-28: qty 2

## 2017-06-28 MED ORDER — LORATADINE 10 MG PO TABS
10.0000 mg | ORAL_TABLET | Freq: Every day | ORAL | Status: DC
  Administered 2017-06-29 – 2017-06-30 (×2): 10 mg via ORAL
  Filled 2017-06-28 (×2): qty 1

## 2017-06-28 MED ORDER — SENNOSIDES-DOCUSATE SODIUM 8.6-50 MG PO TABS: 1.0000 | ORAL_TABLET | Freq: Every evening | ORAL | Status: DC | PRN

## 2017-06-28 MED ORDER — ALBUTEROL SULFATE (2.5 MG/3ML) 0.083% IN NEBU: 2.5000 mg | INHALATION_SOLUTION | RESPIRATORY_TRACT | Status: DC | PRN

## 2017-06-28 MED ORDER — POTASSIUM CHLORIDE CRYS ER 20 MEQ PO TBCR: 60.0000 meq | EXTENDED_RELEASE_TABLET | Freq: Two times a day (BID) | ORAL | Status: DC

## 2017-06-28 MED ORDER — DULOXETINE HCL 20 MG PO CPEP
20.0000 mg | ORAL_CAPSULE | Freq: Every day | ORAL | Status: DC
  Administered 2017-06-29 – 2017-06-30 (×2): 20 mg via ORAL
  Filled 2017-06-28 (×2): qty 1

## 2017-06-28 MED ORDER — ACETAMINOPHEN 325 MG PO TABS: 650.0000 mg | ORAL_TABLET | Freq: Four times a day (QID) | ORAL | Status: DC | PRN

## 2017-06-28 MED ORDER — PIPERACILLIN-TAZOBACTAM 3.375 G IVPB: 3.3750 g | Freq: Three times a day (TID) | INTRAVENOUS | Status: DC

## 2017-06-28 MED ORDER — ETHACRYNIC ACID 25 MG PO TABS
25.0000 mg | ORAL_TABLET | Freq: Every day | ORAL | Status: DC
  Filled 2017-06-28 (×2): qty 1

## 2017-06-28 MED ORDER — SODIUM CHLORIDE 0.9 % IV SOLN
INTRAVENOUS | Status: DC
  Administered 2017-06-28 – 2017-06-29 (×3): via INTRAVENOUS

## 2017-06-28 MED ORDER — TRAMADOL HCL 50 MG PO TABS
100.0000 mg | ORAL_TABLET | Freq: Two times a day (BID) | ORAL | Status: DC
  Administered 2017-06-28 – 2017-06-30 (×4): 100 mg via ORAL
  Filled 2017-06-28 (×4): qty 2

## 2017-06-28 MED ORDER — ONDANSETRON HCL 4 MG PO TABS: 4.0000 mg | ORAL_TABLET | Freq: Four times a day (QID) | ORAL | Status: DC | PRN

## 2017-06-28 MED ORDER — BACLOFEN 10 MG PO TABS
10.0000 mg | ORAL_TABLET | Freq: Three times a day (TID) | ORAL | Status: DC
  Administered 2017-06-28 – 2017-06-30 (×5): 10 mg via ORAL
  Filled 2017-06-28 (×7): qty 1

## 2017-06-28 MED ORDER — ROSUVASTATIN CALCIUM 10 MG PO TABS
5.0000 mg | ORAL_TABLET | Freq: Every day | ORAL | Status: DC
  Administered 2017-06-28 – 2017-06-29 (×2): 5 mg via ORAL
  Filled 2017-06-28 (×2): qty 1

## 2017-06-28 MED ORDER — POTASSIUM CHLORIDE 10 MEQ/100ML IV SOLN
10.0000 meq | INTRAVENOUS | Status: AC
  Administered 2017-06-28: 10 meq via INTRAVENOUS
  Filled 2017-06-28 (×4): qty 100

## 2017-06-28 MED ORDER — POTASSIUM CHLORIDE CRYS ER 20 MEQ PO TBCR
40.0000 meq | EXTENDED_RELEASE_TABLET | Freq: Once | ORAL | Status: AC
  Administered 2017-06-28: 40 meq via ORAL
  Filled 2017-06-28: qty 2

## 2017-06-28 MED ORDER — FLUTICASONE PROPIONATE 50 MCG/ACT NA SUSP
1.0000 | Freq: Every day | NASAL | Status: DC
  Filled 2017-06-28: qty 16

## 2017-06-28 MED ORDER — OXYCODONE HCL 5 MG PO TABS
10.0000 mg | ORAL_TABLET | Freq: Three times a day (TID) | ORAL | Status: DC | PRN
  Administered 2017-06-28 – 2017-06-29 (×2): 10 mg via ORAL
  Filled 2017-06-28 (×2): qty 2

## 2017-06-28 MED ORDER — SODIUM CHLORIDE 0.9 % IV BOLUS
2000.0000 mL | Freq: Once | INTRAVENOUS | Status: AC
  Administered 2017-06-28: 2000 mL via INTRAVENOUS

## 2017-06-28 NOTE — H&P (Signed)
[facility] [place] NAME: Laura Chen    MR#:  [id number]  DATE OF BIRTH:  Jan 21, 1966  DATE OF ADMISSION:  06/28/2017  PRIMARY CARE PHYSICIAN: Lavera Guise, MD   REQUESTING/REFERRING PHYSICIAN: Dr. Mariea Clonts  CHIEF COMPLAINT:   Chief Complaint  Patient presents with  . Cough   Fever and cough for several days. HISTORY OF PRESENT ILLNESS:  Laura Chen  is a 52 y.o. female with a known history of medical problems as below.  The patient has CML on home chemo medication.  She has had cough with sputum and congestion for several days.  She had a fever 101.1 today and came to ED for further evaluation.  She was hospitalized in February for pneumonia and influenza.  Influenza test is negative today.  She was found tachycardia and tachypnea.  Dr. Mariea Clonts started sepsis protocol.  The patient was given antibiotics in the ED.  Dr. Mariea Clonts request admission for sepsis. PAST MEDICAL HISTORY:   Past Medical History:  Diagnosis Date  . Asthma   . CML (chronic myeloid leukemia) ([place]) 2011   (Dr. Alyse Low) stopped gleevec 2/2 side effects  . GERD (gastroesophageal reflux disease)   . History of shingles   . History of syphilis 1990   s/p treatment  . Hypertension   . Leukemia ([place])   . Osteopenia    due to prednisone use, was on reclast  . PUD (peptic ulcer disease)   . Rheumatoid arthritis(714.0) 1990s   Jacoud's arthropathy, previously treated with MTX, TNFa (remicade, enbrel, humira, orencia, rituximab, and actemra)  . SLE (systemic lupus erythematosus) ([place]) 09/2009   Rheum-Dr. Joan Mayans  . Thyroid goiter    Endo-Dr. Eddie Dibbles    PAST SURGICAL HISTORY:   Past Surgical History:  Procedure Laterality Date  . DEXA  2010   osteopenia  . MYOMECTOMY  2008  . right ankle AFO  2011  . TRANSTHORACIC ECHOCARDIOGRAM  08/8240   nl systolic/diastolic fxn, EF 35%, mild MR, normal PA pressures  . US/HSG  05/2010   possible endometrial polyp, rec  rpt 8 wks continue loestrin 24 [facility])    SOCIAL HISTORY:   Social History   Tobacco Use  . Smoking status: Former Smoker    Packs/day: 0.30    Types: Cigarettes  . Smokeless tobacco: Never Used  . Tobacco comment: 6 cigarettes/day  Substance Use Topics  . Alcohol use: No    FAMILY HISTORY:   Family History  Problem Relation Age of Onset  . Heart attack Maternal Grandmother   . Coronary artery disease Maternal Grandmother   . Hypertension Mother   . Hypertension Unknown        Aunt    DRUG ALLERGIES:   Allergies  Allergen Reactions  . Furosemide     rash  . Ibuprofen Swelling  . Sulfa Antibiotics Rash and Other (See Comments)    Reaction:  Burning of feet   . Tape Itching and Rash  . Thiazide-Type Diuretics Rash    REVIEW OF SYSTEMS:   Review of Systems  Constitutional: Positive for malaise/fatigue. Negative for chills and fever.  HENT: Negative for nosebleeds and sore throat.   Eyes: Negative for blurred vision and double vision.  Respiratory: Positive for cough, sputum production and shortness of breath. Negative for hemoptysis, wheezing and stridor.   Cardiovascular: Negative for chest pain, palpitations, orthopnea and leg swelling.  Gastrointestinal: Negative for abdominal pain, blood in stool, constipation, diarrhea, melena, nausea  and vomiting.  Genitourinary: Negative for dysuria, flank pain, frequency, hematuria and urgency.  Musculoskeletal: Negative for falls and joint pain.  Skin: Negative for rash.  Neurological: Negative for dizziness, focal weakness, seizures, loss of consciousness, weakness and headaches.  Endo/Heme/Allergies: Negative for polydipsia. Does not bruise/bleed easily.  Psychiatric/Behavioral: Negative for depression. The patient is not nervous/anxious.     MEDICATIONS AT HOME:   Prior to Admission medications   Medication Sig Start Date End Date Taking? Authorizing Provider  ACCU-CHEK FASTCLIX LANCETS MISC USE TO TEST  BLOOD SUGAR QD UTD 12/11/16  Yes [provider]  ACCU-CHEK SMARTVIEW test strip TEST UTD ONCE D 02/14/17  Yes [provider]  aspirin EC 81 MG tablet Take 81 mg by mouth every evening.    Yes [provider]  baclofen (LIORESAL) 10 MG tablet Take 10 mg by mouth 3 (three) times daily.   Yes [provider]  cetirizine (ZYRTEC) 10 MG tablet Take 10 mg by mouth.   Yes [provider]  chlorthalidone (HYGROTON) 25 MG tablet Take 1 tablet (25 mg total) by mouth daily. 06/12/17  Yes Lavera Guise, MD  cholecalciferol (VITAMIN D) 1000 units tablet Take 2,000 Units by mouth daily.   Yes [provider]  DULoxetine (CYMBALTA) 20 MG capsule Take 1 capsule (20 mg total) by mouth daily. 06/07/17  Yes Ronnell Freshwater, NP  ethacrynic acid (EDECRIN) 25 MG tablet Take 1 tablet by mouth daily. 06/18/17  Yes [provider]  fluticasone (FLONASE) 50 MCG/ACT nasal spray Place 1 spray into both nostrils daily. 03/01/17  Yes Lavera Guise, MD  guaiFENesin-dextromethorphan [facility] DM) 100-10 MG/5ML syrup Take 5 mLs by mouth every 4 (four) hours as needed for cough. 04/15/17  Yes Dustin Flock, MD  linaclotide [facility]) 290 MCG CAPS capsule Take 1 capsule (290 mcg total) by mouth daily. 06/27/17  Yes Lavera Guise, MD  omeprazole (PRILOSEC) 20 MG capsule Take 1 capsule (20 mg total) by mouth daily. 06/07/17  Yes Boscia, Greer Ee, NP  Oxycodone HCl 10 MG TABS Take 10 mg by mouth 3 (three) times daily as needed (for pain).   Yes [provider]  ponatinib HCl (ICLUSIG) 15 MG tablet Take 30 mg by mouth daily.   Yes [provider]  potassium chloride SA (K-DUR,KLOR-CON) 20 MEQ tablet Take 2 tablets (40 mEq total) by mouth 2 (two) times daily. Patient taking differently: Take 60 mEq by mouth 2 (two) times daily.  08/02/15  Yes Lloyd Huger, MD  predniSONE (DELTASONE) 5 MG tablet Take 5 mg by mouth daily with breakfast.   Yes [provider]  rosuvastatin (CRESTOR) 5 MG tablet Take 1 tablet (5 mg total) by mouth daily with supper. 06/20/17  Yes Lavera Guise, MD  traMADol (ULTRAM) 50 MG tablet Take 2 tablets (100 mg total) by mouth 2 (two) times daily. 05/17/17  Yes Ronnell Freshwater, NP      VITAL SIGNS:  Blood pressure 110/74, pulse 88, temperature (!) 101.1 F (38.4 C), temperature source Oral, resp. rate 17, height 5\' 3"  (1.6 m), weight 240 lb (108.9 kg), SpO2 97 %.  PHYSICAL EXAMINATION:  Physical Exam  GENERAL:  52 y.o.-year-old patient lying in the bed with no acute distress.  Morbid obesity. EYES: Pupils equal, round, reactive to light and accommodation. No scleral icterus. Extraocular muscles intact.  HEENT: Head atraumatic, normocephalic. Oropharynx and nasopharynx clear.  NECK:  Supple, no jugular venous distention. No thyroid enlargement, no  tenderness.  LUNGS: Normal breath sounds bilaterally, no wheezing, rales,rhonchi or crepitation. No use of accessory muscles of respiration.  CARDIOVASCULAR: S1, S2 normal. No murmurs, rubs, or gallops.  ABDOMEN: Soft, nontender, nondistended. Bowel sounds present. No organomegaly or mass.  EXTREMITIES: No pedal edema, cyanosis, or clubbing.  NEUROLOGIC: Cranial nerves II through XII are intact. Muscle strength 5/5 in all extremities. Sensation intact. Gait not checked.  PSYCHIATRIC: The patient is alert and oriented x 3.  SKIN: No obvious rash, lesion, or ulcer.   LABORATORY PANEL:   CBC Recent Labs  Lab 06/28/17 0854  WBC 10.4  HGB 12.8  HCT 40.7  PLT 233   ------------------------------------------------------------------------------------------------------------------  Chemistries  Recent Labs  Lab 06/28/17 0854  NA 135  K 2.5*  CL 98*  CO2 27  GLUCOSE 118*  BUN 11  CREATININE 1.00  CALCIUM 8.7*  AST 44*  ALT 64*  ALKPHOS 218*  BILITOT 0.4    ------------------------------------------------------------------------------------------------------------------  Cardiac Enzymes No results for input(s): TROPONINI in the last 168 hours. ------------------------------------------------------------------------------------------------------------------  RADIOLOGY:  Dg Chest 2 View  Result Date: 06/28/2017 CLINICAL DATA:  Cough and congestion with body aches. History of leukemia. EXAM: CHEST - 2 VIEW COMPARISON:  Chest x-ray dated April 13, 2017. FINDINGS: The heart size and mediastinal contours are within normal limits. Normal pulmonary vascularity. Unchanged eventration of the anterior right hemidiaphragm with blunting of the right costophrenic angle. Bronchitic changes appear slightly worsened when compared to prior study. Stable scarring/atelectasis at the left lung base. No focal consolidation, pleural effusion, or pneumothorax. No acute osseous abnormality. IMPRESSION: Slightly worsened bronchitic changes.  No consolidation. Electronically Signed   By: Titus Dubin M.D.   On: 06/28/2017 09:35      IMPRESSION AND PLAN:   Sepsis, possible due to acute bronchitis. The patient will be admitted to medical floor. Continue antibiotics including cefepime and vancomycin pharmacy to dose.  Follow-up CBC and cultures.  Potassium as needed and nebulizer as needed.  Hypokalemia.  Give IV potassium in ED and continue home potassium, follow-up potassium level and magnesium level. CML, on chemotherapy, hold home chemotherapy medication.  Oncology consult.  Morbid obesity.  All the records are reviewed and case discussed with ED provider. Management plans discussed with the patient, her mother and they are in agreement.  CODE STATUS: Full code.  TOTAL TIME TAKING CARE OF THIS PATIENT: 48 minutes.    Demetrios Loll M.D on 06/28/2017 at 3:08 PM  Between 7am to 6pm - Pager - [phone]  After 6pm go to [email or web address] - password [email or web address]  [facility]  [employer] [facility]  Office  [phone]  CC: Primary care physician; Lavera Guise, MD   Note: This dictation was prepared with Dragon dictation along with smaller phrase technology. Any transcriptional errors that result from this process are unin

## 2017-06-28 NOTE — ED Notes (Signed)
Patient notified of need of urine sample. States she is unable to void at this time.

## 2017-06-28 NOTE — ED Notes (Signed)
Patient denies abdomina pain or N/V/D. Patient reports chest wall pain when coughing.

## 2017-06-28 NOTE — Progress Notes (Signed)
Pharmacy Electrolyte Monitoring Consult:  Pharmacy consulted to assist in monitoring and replacing electrolytes in this 52 y.o. female admitted on 06/28/2017 with Cough   Labs:  Sodium (mmol/L)  Date Value  06/28/2017 135  06/08/2014 135   Potassium (mmol/L)  Date Value  06/28/2017 2.5 (LL)  06/08/2014 2.9 (L)  10/11/2010 3.8   Magnesium (mg/dL)  Date Value  04/14/2017 1.8  06/08/2014 2.1   Phosphorus (mg/dL)  Date Value  01/18/2015 2.9  06/08/2014 2.7   Calcium (mg/dL)  Date Value  06/28/2017 8.7 (L)  10/11/2010 8.0   Calcium, Total (mg/dL)  Date Value  06/08/2014 8.8 (L)   Albumin (g/dL)  Date Value  06/28/2017 3.8  06/08/2014 3.7    Assessment/Plan: Patient ordered potassium 53mEq PO x 1 and potassium 57mEq IV Q1hr x 4 doses (only 2 doses currently documented as given).   Magnesium and Phosphorus levels but currently not performed.   Will recheck potassium at 1800.   Pharmacy will continue to monitor and adjust per consult.    Julena Barbour L 06/28/2017 4:59 PM

## 2017-06-28 NOTE — ED Notes (Signed)
Pharmacy notified of need of vanc.

## 2017-06-28 NOTE — ED Notes (Signed)
Patient transported to radiology

## 2017-06-28 NOTE — ED Notes (Signed)
First Nurse Note:  Patient complaining of congestion, cough, and body aches.  States she has leukemia and is currently being treated.  Face mask applied.

## 2017-06-28 NOTE — Progress Notes (Signed)
Pharmacy Electrolyte Monitoring Consult:  Pharmacy consulted to assist in monitoring and replacing electrolytes in this 52 y.o. female admitted on 06/28/2017 with Cough   Labs:  Sodium (mmol/L)  Date Value  06/28/2017 135  06/08/2014 135   Potassium (mmol/L)  Date Value  06/28/2017 3.0 (L)  06/08/2014 2.9 (L)  10/11/2010 3.8   Magnesium (mg/dL)  Date Value  06/28/2017 1.9  06/08/2014 2.1   Phosphorus (mg/dL)  Date Value  06/28/2017 3.1  06/08/2014 2.7   Calcium (mg/dL)  Date Value  06/28/2017 8.7 (L)  10/11/2010 8.0   Calcium, Total (mg/dL)  Date Value  06/08/2014 8.8 (L)   Albumin (g/dL)  Date Value  06/28/2017 3.8  06/08/2014 3.7    Assessment/Plan: Will continue with MD ordered potassium 98mEq PO BID (Patient's home dose). Will initiate magnesium oxide 400mg  PO BID.   Will recheck electrolytes with am labs.    Pharmacy will continue to monitor and adjust per consult.    Simpson,Michael L 06/28/2017 6:57 PM

## 2017-06-28 NOTE — ED Provider Notes (Addendum)
[facility] Emergency Department Provider Note  ____________________________________________  Time seen: Approximately 10:30 AM  I have reviewed the triage vital signs and the nursing notes.   HISTORY  Chief Complaint Cough    HPI Laura Chen is a 52 y.o. female on daily chemo for CML presenting with fever and cough.  The patient reports that since Monday, she has had a cough productive of thick yellow and white sputum with associated congestion and rhinorrhea.  She has not had any ear pain, sore throat, and has not had a fever greater than 99.9 at home.  Here, the patient does have a temperature of 101.1.  The patient denies any nausea vomiting or diarrhea, myalgias, or known sick contacts.  She had a hospitalization in February for pneumonia and influenza.  She did receive her influenza vaccination this year.  Past Medical History:  Diagnosis Date  . Asthma   . CML (chronic myeloid leukemia) ([place]) 2011   (Dr. Alyse Low) stopped gleevec 2/2 side effects  . GERD (gastroesophageal reflux disease)   . History of shingles   . History of syphilis 1990   s/p treatment  . Hypertension   . Leukemia ([place])   . Osteopenia    due to prednisone use, was on reclast  . PUD (peptic ulcer disease)   . Rheumatoid arthritis(714.0) 1990s   Jacoud's arthropathy, previously treated with MTX, TNFa (remicade, enbrel, humira, orencia, rituximab, and actemra)  . SLE (systemic lupus erythematosus) ([place]) 09/2009   Rheum-Dr. Joan Mayans  . Thyroid goiter    Endo-Dr. Eddie Dibbles    Patient Active Problem List   Diagnosis Date Noted  . Acute on chronic kidney failure ([place]) 04/14/2017  . (HFpEF) heart failure with preserved ejection fraction ([place]) 12/12/2016  . Other chest pain 12/12/2016  . Pneumonia 04/30/2015  . Acute renal failure (ARF) ([place]) 04/24/2015  . Elevated LFTs 04/24/2015  . Hyponatremia 04/24/2015  . Influenza A 04/24/2015  . Oral thrush 04/24/2015  . Pulmonary  nodule 08/26/2014  . Iritis 04/30/2013  . Steroid responder, bilateral 04/30/2013  . Hemoptysis 11/02/2012  . Constipation 05/08/2012  . Complete rupture of rotator cuff 09/19/2011  . 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 Effusion of wrist 11/22/2010  . Hand joint pain 11/22/2010  . Polymenorrhea 10/28/2010  . Anemia 10/28/2010  . External otitis 10/27/2010  . Endometrial polyp 10/27/2010  . Menorrhagia 10/27/2010  . Right flank pain 10/12/2010  . Fatigue 06/23/2010  . Skin rash 06/07/2010  . Edema 06/07/2010  . Tobacco abuse 06/07/2010  . Tobacco dependence syndrome 06/07/2010  . Primary localized osteoarthrosis, lower leg 05/11/2010  . Chronic myeloid leukemia ([place]) 03/24/2010  . ASTHMA 03/24/2010  . GERD 03/24/2010  . SYSTEMIC LUPUS ERYTHEMATOSUS 03/24/2010  . Rheumatoid arthritis ([place]) 03/24/2010  . FLATULENCE ERUCTATION AND GAS PAIN 03/24/2010  . Asthma 03/24/2010    Past Surgical History:  Procedure Laterality Date  . DEXA  2010   osteopenia  . MYOMECTOMY  2008  . right ankle AFO  2011  . TRANSTHORACIC ECHOCARDIOGRAM  0/3546   nl systolic/diastolic fxn, EF 56%, mild MR, normal PA pressures  . US/HSG  05/2010   possible endometrial polyp, rec rpt 8 wks continue loestrin 24 [facility])    Current Outpatient Rx  . Order #: [id number] Class: Historical Med  . Order #: [id number] Class: Historical Med  . Order #: [id number] Class: Historical Med  . Order #: [id number] Class: Historical Med  . Order #: [id number] Class: Historical Med  . Order #: [id number] Class: Normal  .  Order #: [id number] Class: Historical Med  . Order #: [id number] Class: Normal  . Order #: [id number] Class: Historical Med  . Order #: [id number] Class: Normal  . Order #: [id number] Class: Normal  . Order #: [id number] Class: Normal  . Order #: [id number] Class: Normal  . Order #: [id number] Class: Historical Med  . Order #: [id number] Class: Historical Med  . Order #: [id number] Class: Normal  . Order #:  [id number] Class: Historical Med  . Order #: [id number] Class: Normal  . Order #: [id number] Class: Normal    Allergies Furosemide; Ibuprofen; Sulfa antibiotics; Tape; and Thiazide-type diuretics  Family History  Problem Relation Age of Onset  . Heart attack Maternal Grandmother   . Coronary artery disease Maternal Grandmother   . Hypertension Mother   . Hypertension Unknown        Aunt    Social History Social History   Tobacco Use  . Smoking status: Former Smoker    Packs/day: 0.30    Types: Cigarettes  . Smokeless tobacco: Never Used  . Tobacco comment: 6 cigarettes/day  Substance Use Topics  . Alcohol use: No  . Drug use: No    Review of Systems Constitutional: No fever/chills.  No lightheadedness or syncope.  Positive general malaise. Eyes: No visual changes.  No eye discharge. ENT: No sore throat.  Positive congestion with rhinorrhea.  No ear pain. Cardiovascular: Denies chest pain. Denies palpitations. Respiratory: Denies shortness of breath.  Positive productive cough. Gastrointestinal: No abdominal pain.  No nausea, no vomiting.  No diarrhea.  No constipation. Genitourinary: Negative for dysuria. Musculoskeletal: Negative for back pain. Skin: Negative for rash. Neurological: Negative for headaches. No focal numbness, tingling or weakness.     ____________________________________________   PHYSICAL EXAM:  VITAL SIGNS: ED Triage Vitals  Enc Vitals Group     BP 06/28/17 0829 (!) 139/52     Pulse Rate 06/28/17 0829 100     Resp 06/28/17 0829 20     Temp 06/28/17 0829 (!) 101.1 F (38.4 C)     Temp Source 06/28/17 0829 Oral     SpO2 06/28/17 0829 97 %     Weight 06/28/17 0830 240 lb (108.9 kg)     Height 06/28/17 0830 5\' 3"  (1.6 m)     Head Circumference --      Peak Flow --      Pain Score 06/28/17 0830 8     Pain Loc --      Pain Edu? --      Excl. in [place]? --     Constitutional: Alert and oriented. Well appearing and in no acute distress. Answers  questions appropriately. Eyes: Conjunctivae are normal.  EOMI. No scleral icterus.  No eye discharge. Head: Atraumatic. Nose: Mild congestion without rhinorrhea. Mouth/Throat: Mucous membranes are mildly dry.  No posterior pharyngeal erythema, tonsillar swelling or exudate.  The posterior palate is symmetric and the uvula is midline.  No drooling or stridor. Neck: No stridor.  Supple.  No JVD.  No meningismus. Cardiovascular: Normal rate, regular rhythm. No murmurs, rubs or gallops.  Respiratory: Normal respiratory effort.  No accessory muscle use or retractions.  Mild end expiratory wheezing most prominent in the right upper lobe.  No rales or rhonchi. Gastrointestinal: Morbidly obese.  Soft, nontender and nondistended.  No guarding or rebound.  No peritoneal signs. Musculoskeletal: No LE edema. No ttp in the calves or palpable cords.  Negative Homan's sign. Neurologic:  A&Ox3.  Speech is clear.  Face and smile are symmetric.  EOMI.  Moves all extremities well. Skin:  Skin is warm, dry and intact. No rash noted. Psychiatric: Mood and affect are normal. Speech and behavior are normal.  Normal judgement  ____________________________________________   LABS (all labs ordered are listed, but only abnormal results are displayed)  Labs Reviewed  COMPREHENSIVE METABOLIC PANEL - Abnormal; Notable for the following components:      Result Value   Potassium 2.5 (*)    Chloride 98 (*)    Glucose, Bld 118 (*)    Calcium 8.7 (*)    AST 44 (*)    ALT 64 (*)    Alkaline Phosphatase 218 (*)    All other components within normal limits  CBC WITH DIFFERENTIAL/PLATELET - Abnormal; Notable for the following components:   RBC 5.39 (*)    MCV 75.5 (*)    MCH 23.8 (*)    MCHC 31.5 (*)    RDW 23.4 (*)    Monocytes Absolute 2.2 (*)    All other components within normal limits  URINALYSIS, COMPLETE (UACMP) WITH MICROSCOPIC - Abnormal; Notable for the following components:   Color, Urine STRAW (*)     APPearance CLEAR (*)    All other components within normal limits  BLOOD GAS, VENOUS - Abnormal; Notable for the following components:   pO2, Ven <31.0 (*)    Bicarbonate 31.5 (*)    Acid-Base Excess 5.3 (*)    All other components within normal limits  CULTURE, BLOOD (ROUTINE X 2)  CULTURE, BLOOD (ROUTINE X 2)  LACTIC ACID, PLASMA  PROTIME-INR  INFLUENZA PANEL BY PCR (TYPE A & B)  POC URINE PREG, ED  POCT PREGNANCY, URINE   ____________________________________________  EKG  ED ECG REPORT I, Eula Listen, the attending physician, personally viewed and interpreted this ECG.   Date: 06/28/2017  EKG Time: 916  Rate: 95  Rhythm: normal sinus rhythm  Axis: normal  Intervals:none  ST&T Change: No STEMI  ____________________________________________  RADIOLOGY  Dg Chest 2 View  Result Date: 06/28/2017 CLINICAL DATA:  Cough and congestion with body aches. History of leukemia. EXAM: CHEST - 2 VIEW COMPARISON:  Chest x-ray dated April 13, 2017. FINDINGS: The heart size and mediastinal contours are within normal limits. Normal pulmonary vascularity. Unchanged eventration of the anterior right hemidiaphragm with blunting of the right costophrenic angle. Bronchitic changes appear slightly worsened when compared to prior study. Stable scarring/atelectasis at the left lung base. No focal consolidation, pleural effusion, or pneumothorax. No acute osseous abnormality. IMPRESSION: Slightly worsened bronchitic changes.  No consolidation. Electronically Signed   By: Titus Dubin M.D.   On: 06/28/2017 09:35    ____________________________________________   PROCEDURES  Procedure(s) performed: None  Procedures  Critical Care performed: Yes ____________________________________________   INITIAL IMPRESSION / ASSESSMENT AND PLAN / ED COURSE  Pertinent labs & imaging results that were available during my care of the patient were reviewed by me and considered in my medical  decision making (see chart for details).  52 y.o. female on daily chemo for CML presenting with cough and fever.  Overall, the patient is oxygenating well at greater than 97% on room air.  Most likely, the patient has a viral URI resulting in a fever.  However, given her immunocompromise state, will cover her for sepsis given her tachycardia and fever.  Her chest x-ray does not show any acute infiltrate. Influenza testing is negative.  The patient will be treated with albuterol for her wheezing.  Intravenous fluids have been ordered.  An antipyretic has been given.  The patient will  be admitted at this time for continued evaluation and treatment.  CRITICAL CARE Performed by: Eula Listen   Total critical care time: 35 minutes  Critical care time was exclusive of separately billable procedures and treating other patients.  Critical care was necessary to treat or prevent imminent or life-threatening deterioration.  Critical care was time spent personally by me on the following activities: development of treatment plan with patient and/or surrogate as well as nursing, discussions with consultants, evaluation of patient's response to treatment, examination of patient, obtaining history from patient or surrogate, ordering and performing treatments and interventions, ordering and review of laboratory studies, ordering and review of radiographic studies, pulse oximetry and re-evaluation of patient's condition.   ____________________________________________  FINAL CLINICAL IMPRESSION(S) / ED DIAGNOSES  Final diagnoses:  Hypokalemia  Cough  Fever, unspecified fever cause         NEW MEDICATIONS STARTED DURING THIS VISIT:  New Prescriptions   No medications on file      Eula Listen, MD 06/28/17 1128    Eula Listen, MD 07/19/17 1215

## 2017-06-28 NOTE — Progress Notes (Signed)
Pharmacy Antibiotic Note  Laura Chen is a 52 y.o. female admitted on 06/28/2017 with sepsis. Patient presenting in ED with fever and productive cough. Patient was last hospitalized in February for pneumonia and influenza. Pharmacy has been consulted for Vancomycin and Zosyn dosing. Patient received MD ordered Vacnomycin 2g and Zosyn in ED.   Plan: Vancomycin 1250mg  IV Q12hr for goal trough of 15-20. Will obtain trough prior to fifth dose of vancomycin dosing.   Cefepime 2g IV Q12hr.   MRSA PCR and procalicitonin ordered and pending.   Height: 5\' 3"  (160 cm) Weight: 240 lb (108.9 kg) IBW/kg (Calculated) : 52.4  Temp (24hrs), Avg:101.1 F (38.4 C), Min:101.1 F (38.4 C), Max:101.1 F (38.4 C)  Recent Labs  Lab 06/28/17 0854 06/28/17 1032  WBC 10.4  --   CREATININE 1.00  --   LATICACIDVEN  --  1.5    Estimated Creatinine Clearance: 80.6 mL/min (by C-G formula based on SCr of 1 mg/dL).    Allergies  Allergen Reactions  . Furosemide     rash  . Ibuprofen Swelling  . Sulfa Antibiotics Rash and Other (See Comments)    Reaction:  Burning of feet   . Tape Itching and Rash  . Thiazide-Type Diuretics Rash    Antimicrobials this admission: Vancomycin 5/9 >>  Cefepime 5/9 >> Zosyn 5/9 x 1   Dose adjustments this admission: N/A  Microbiology results: 5/9 BCx: pending  5/9 MRSA PCR: pending   Thank you for allowing pharmacy to be a part of this patient's care.  Holly Pring L 06/28/2017 2:03 PM

## 2017-06-28 NOTE — Progress Notes (Signed)
Pt admitted to room 142 from the ED. Pt is A&Ox4, reports pain 7/10 generalized pain from RA. Pt not currently coughing, but reports coughing up yellow phlegm. Telemetry applied, IVF started. Pt oriented to room and plan of care.   [place], Jerry Caras

## 2017-06-28 NOTE — ED Triage Notes (Signed)
Pt currently receiving chemo for leukemia.  Low grade temps at home around 100.  Febrile in triage. Has had cough and chest/nasal congestion. Unlabored in triage.

## 2017-06-29 DIAGNOSIS — M069 Rheumatoid arthritis, unspecified: Secondary | ICD-10-CM

## 2017-06-29 DIAGNOSIS — A419 Sepsis, unspecified organism: Principal | ICD-10-CM

## 2017-06-29 DIAGNOSIS — C9200 Acute myeloblastic leukemia, not having achieved remission: Secondary | ICD-10-CM

## 2017-06-29 DIAGNOSIS — R509 Fever, unspecified: Secondary | ICD-10-CM

## 2017-06-29 LAB — BASIC METABOLIC PANEL WITH GFR
Anion gap: 8 (ref 5–15)
BUN: 9 mg/dL (ref 6–20)
CO2: 27 mmol/L (ref 22–32)
Calcium: 8.2 mg/dL — ABNORMAL LOW (ref 8.9–10.3)
Chloride: 105 mmol/L (ref 101–111)
Creatinine, Ser: 0.87 mg/dL (ref 0.44–1.00)
GFR calc Af Amer: >60 mL/min
GFR calc non Af Amer: >60 mL/min
Glucose, Bld: 107 mg/dL — ABNORMAL HIGH (ref 65–99)
Potassium: 3.2 mmol/L — ABNORMAL LOW (ref 3.5–5.1)
Sodium: 140 mmol/L (ref 135–145)

## 2017-06-29 LAB — CBC
HCT: 38.4 % (ref 35.0–47.0)
Hemoglobin: 11.9 g/dL — ABNORMAL LOW (ref 12.0–16.0)
MCH: 23.5 pg — ABNORMAL LOW (ref 26.0–34.0)
MCHC: 30.9 g/dL — ABNORMAL LOW (ref 32.0–36.0)
MCV: 76.1 fL — ABNORMAL LOW (ref 80.0–100.0)
Platelets: 214 10*3/uL (ref 150–440)
RBC: 5.05 MIL/uL (ref 3.80–5.20)
RDW: 23.3 % — ABNORMAL HIGH (ref 11.5–14.5)
WBC: 8.2 10*3/uL (ref 3.6–11.0)

## 2017-06-29 LAB — PROCALCITONIN: Procalcitonin: <0.1 ng/mL

## 2017-06-29 LAB — MRSA PCR SCREENING: MRSA by PCR: NEGATIVE

## 2017-06-29 LAB — HIV ANTIBODY (ROUTINE TESTING W REFLEX): HIV Screen 4th Generation wRfx: NONREACTIVE

## 2017-06-29 NOTE — Consult Note (Signed)
[place]  [phone] [phone]  ID: [name] OB: [birth year]  MR#: [id number]  JQZ#:[id number]  Patient Care Team: Lavera Guise, MD as PCP - General (Internal Medicine) Lavera Guise, MD (Internal Medicine) Christene Lye, MD (General Surgery)  CHIEF COMPLAINT: CML, admitted with fever possible sepsis.  INTERVAL HISTORY: Patient is a 52 year old female last evaluated in clinic in August 2018 who was admitted through the emergency room with fevers and concern for sepsis.  She continues to have increased cough with congestion.  She has no neurologic complaints.  She denies any chest pain or hemoptysis.  She has no nausea, vomiting, constipation, or diarrhea.  She has no urinary complaints.  Patient feels generally terrible, but offers no further specific complaints today.  REVIEW OF SYSTEMS:   Review of Systems  Constitutional: Positive for fever and malaise/fatigue. Negative for weight loss.  HENT: Positive for congestion.   Respiratory: Positive for cough and shortness of breath. Negative for hemoptysis.   Cardiovascular: Negative.  Negative for chest pain.  Gastrointestinal: Negative.  Negative for abdominal pain, nausea and vomiting.  Genitourinary: Negative.  Negative for dysuria.  Musculoskeletal: Positive for joint pain.  Skin: Negative.  Negative for rash.  Neurological: Negative.  Negative for sensory change, focal weakness and weakness.  Psychiatric/Behavioral: Negative.  The patient is not nervous/anxious.     As per HPI. Otherwise, a complete review of systems is negative.  PAST MEDICAL HISTORY: Past Medical History:  Diagnosis Date  . Asthma   . CML (chronic myeloid leukemia) ([place]) 2011   (Dr. Alyse Low) stopped gleevec 2/2 side effects  . GERD (gastroesophageal reflux disease)   . History of shingles   . History of syphilis 1990   s/p treatment  . Hypertension   . Leukemia ([place])   . Osteopenia    due to prednisone use, was on reclast  . PUD (peptic ulcer disease)   . Rheumatoid arthritis(714.0) 1990s   Jacoud's arthropathy, previously treated with MTX, TNFa (remicade, enbrel, humira, orencia, rituximab, and actemra)  . SLE (systemic lupus erythematosus) ([place]) 09/2009   Rheum-Dr. Joan Mayans  . Thyroid goiter    Endo-Dr. Eddie Dibbles    PAST SURGICAL HISTORY: Past Surgical History:  Procedure Laterality Date  . DEXA  2010   osteopenia  . MYOMECTOMY  2008  . right ankle AFO  2011  . TRANSTHORACIC ECHOCARDIOGRAM  07/2261   nl systolic/diastolic fxn, EF 33%, mild MR, normal PA pressures  . US/HSG  05/2010   possible endometrial polyp, rec rpt 8 wks continue loestrin 24 ([place])    FAMILY HISTORY: Family History  Problem Relation Age of Onset  . Heart attack Maternal Grandmother   . Coronary artery disease Maternal Grandmother   . Hypertension Mother   . Hypertension Unknown        Aunt    ADVANCED DIRECTIVES (Y/N):  @ADVDIR @  HEALTH MAINTENANCE: Social History   Tobacco Use  . Smoking status: Former Smoker    Packs/day: 0.30    Types: Cigarettes  . Smokeless tobacco: Never Used  . Tobacco comment: 6 cigarettes/day  Substance Use Topics  . Alcohol use: No  . Drug use: No     Colonoscopy:  PAP:  Bone density:  Lipid panel:  Allergies  Allergen Reactions  . Furosemide     rash  . Ibuprofen Swelling  . Sulfa Antibiotics Rash and Other (See Comments)    Reaction:  Burning of feet   . Tape Itching and  Rash  . Thiazide-Type Diuretics Rash    Current Facility-Administered Medications  Medication Dose Route Frequency Provider Last Rate Last Dose  . 0.9 %  sodium chloride infusion   Intravenous Continuous Demetrios Loll, MD 100 mL/hr at 06/29/17 0540    . acetaminophen (TYLENOL) tablet 650 mg  650 mg Oral Q6H PRN Demetrios Loll, MD       Or  . acetaminophen (TYLENOL) suppository 650 mg  650 mg Rectal Q6H PRN Demetrios Loll, MD      . albuterol (PROVENTIL) (2.5 MG/3ML)  0.083% nebulizer solution 2.5 mg  2.5 mg Nebulization Q2H PRN Demetrios Loll, MD      . aspirin EC tablet 81 mg  81 mg Oral QPM Demetrios Loll, MD   81 mg at 06/28/17 1813  . baclofen (LIORESAL) tablet 10 mg  10 mg Oral TID Demetrios Loll, MD   10 mg at 06/29/17 0934  . bisacodyl (DULCOLAX) EC tablet 5 mg  5 mg Oral Daily PRN Demetrios Loll, MD      . ceFEPIme (MAXIPIME) 2 g in sodium chloride 0.9 % 100 mL IVPB  2 g Intravenous Q12H Demetrios Loll, MD 200 mL/hr at 06/29/17 0540 2 g at 06/29/17 0540  . DULoxetine (CYMBALTA) DR capsule 20 mg  20 mg Oral Daily Demetrios Loll, MD   20 mg at 06/29/17 0934  . enoxaparin (LOVENOX) injection 40 mg  40 mg Subcutaneous Q24H Demetrios Loll, MD   40 mg at 06/28/17 2143  . ethacrynic acid (EDECRIN) tablet 25 mg  25 mg Oral Daily Demetrios Loll, MD      . fluticasone [facility]) 50 MCG/ACT nasal spray 1 spray  1 spray Each Nare Daily Demetrios Loll, MD      . guaiFENesin-dextromethorphan [facility] DM) 100-10 MG/5ML syrup 5 mL  5 mL Oral Q4H PRN Demetrios Loll, MD      . loratadine (CLARITIN) tablet 10 mg  10 mg Oral Daily Demetrios Loll, MD   10 mg at 06/29/17 0933  . magnesium oxide (MAG-OX) tablet 400 mg  400 mg Oral BID Demetrios Loll, MD   400 mg at 06/29/17 0934  . ondansetron (ZOFRAN) tablet 4 mg  4 mg Oral Q6H PRN Demetrios Loll, MD       Or  . ondansetron [facility]) injection 4 mg  4 mg Intravenous Q6H PRN Demetrios Loll, MD      . oxyCODONE (Oxy IR/ROXICODONE) immediate release tablet 10 mg  10 mg Oral TID PRN Demetrios Loll, MD   10 mg at 06/29/17 1144  . pantoprazole (PROTONIX) EC tablet 40 mg  40 mg Oral Daily Demetrios Loll, MD   40 mg at 06/29/17 0933  . potassium chloride SA (K-DUR,KLOR-CON) CR tablet 60 mEq  60 mEq Oral BID Demetrios Loll, MD   60 mEq at 06/29/17 0933  . predniSONE (DELTASONE) tablet 5 mg  5 mg Oral Q breakfast Demetrios Loll, MD   5 mg at 06/29/17 0934  . rosuvastatin (CRESTOR) tablet 5 mg  5 mg Oral Q supper Demetrios Loll, MD   5 mg at 06/28/17 1812  . senna-docusate (Senokot-S) tablet 1 tablet  1  tablet Oral QHS PRN Demetrios Loll, MD      . traMADol [facility]) tablet 100 mg  100 mg Oral BID Demetrios Loll, MD   100 mg at 06/29/17 0933    OBJECTIVE: Vitals:   06/28/17 2335 06/29/17 0858  BP: 138/82 (!) 116/56  Pulse: 93 95  Resp: 18 18  Temp: 99.2 F (37.3  C) 99.7 F (37.6 C)  SpO2: 100% 96%     Body mass index is 42.51 kg/m.    ECOG FS:2 - Symptomatic, <50% confined to bed  General: Ill-appearing, no acute distress. Eyes: Pink conjunctiva, anicteric sclera. HEENT: Normocephalic, moist mucous membranes, clear oropharnyx. Lungs: Clear to auscultation bilaterally. Heart: Regular rate and rhythm. No rubs, murmurs, or gallops. Abdomen: Soft, nontender, nondistended. No organomegaly noted, normoactive bowel sounds. Musculoskeletal: No edema, cyanosis, or clubbing. Neuro: Alert, answering all questions appropriately. Cranial nerves grossly intact. Skin: No rashes or petechiae noted. Psych: Normal affect.  LAB RESULTS:  Lab Results  Component Value Date   NA 140 06/29/2017   K 3.2 (L) 06/29/2017   CL 105 06/29/2017   CO2 27 06/29/2017   GLUCOSE 107 (H) 06/29/2017   BUN 9 06/29/2017   CREATININE 0.87 06/29/2017   CALCIUM 8.2 (L) 06/29/2017   PROT 6.8 06/28/2017   ALBUMIN 3.8 06/28/2017   AST 44 (H) 06/28/2017   ALT 64 (H) 06/28/2017   ALKPHOS 218 (H) 06/28/2017   BILITOT 0.4 06/28/2017   GFRNONAA >60 06/29/2017   GFRAA >60 06/29/2017    Lab Results  Component Value Date   WBC 8.2 06/29/2017   NEUTROABS 6.3 06/28/2017   HGB 11.9 (L) 06/29/2017   HCT 38.4 06/29/2017   MCV 76.1 (L) 06/29/2017   PLT 214 06/29/2017     STUDIES: Dg Chest 2 View  Result Date: 06/28/2017 CLINICAL DATA:  Cough and congestion with body aches. History of leukemia. EXAM: CHEST - 2 VIEW COMPARISON:  Chest x-ray dated April 13, 2017. FINDINGS: The heart size and mediastinal contours are within normal limits. Normal pulmonary vascularity. Unchanged eventration of the anterior right  hemidiaphragm with blunting of the right costophrenic angle. Bronchitic changes appear slightly worsened when compared to prior study. Stable scarring/atelectasis at the left lung base. No focal consolidation, pleural effusion, or pneumothorax. No acute osseous abnormality. IMPRESSION: Slightly worsened bronchitic changes.  No consolidation. Electronically Signed   By: Titus Dubin M.D.   On: 06/28/2017 09:35    ASSESSMENT: CML, admitted with fever possible sepsis.  PLAN:    1.  CML: Patient currently taking ponatinib 15 mg daily.  Okay to hold treatment during hospitalization.  Patient has been instructed to reinitiate treatment upon discharge.  She continues to follow-up [facility] at [facility].  She has been instructed to ensure follow-up at [facility] center within 1 to 2 weeks of discharge.  No further interventions are needed. 2.  Rheumatoid arthritis: Patient continues to have to receive Rituxan for 2 infusions separated by 14 days every 6 months.  Continue treatment and monitoring per rheumatology. 3.  Fever/sepsis: Continue current antibiotics and treatment per sepsis protocol.  Appreciate consult, call with questions.   Lloyd Huger, MD   06/29/2017 12:20 PM

## 2017-06-29 NOTE — Progress Notes (Signed)
Pharmacy Electrolyte Monitoring Consult:  Pharmacy consulted to assist in monitoring and replacing electrolytes in this 52 y.o. female admitted on 06/28/2017 with Cough   Labs:  Sodium (mmol/L)  Date Value  06/29/2017 140  06/08/2014 135   Potassium (mmol/L)  Date Value  06/29/2017 3.2 (L)  06/08/2014 2.9 (L)  10/11/2010 3.8   Magnesium (mg/dL)  Date Value  06/28/2017 1.9  06/08/2014 2.1   Phosphorus (mg/dL)  Date Value  06/28/2017 3.1  06/08/2014 2.7   Calcium (mg/dL)  Date Value  06/29/2017 8.2 (L)  10/11/2010 8.0   Calcium, Total (mg/dL)  Date Value  06/08/2014 8.8 (L)   Albumin (g/dL)  Date Value  06/28/2017 3.8  06/08/2014 3.7    Assessment/Plan: K: 3.2 Will continue with MD ordered potassium 47mEq PO BID (Patient's home dose). WContinue magnesium oxide 400mg  PO BID.   Will recheck electrolytes with am labs.    Pharmacy will continue to monitor and adjust per consult.   Pernell Dupre, PharmD, BCPS Clinical Pharmacist 06/29/2017 3:24 PM

## 2017-06-29 NOTE — Progress Notes (Signed)
[place] [facility] [place] NAME: Laura Chen    MR#:  [id number]  DATE OF BIRTH:  Feb 03, 1966  SUBJECTIVE:  CHIEF COMPLAINT:   Chief Complaint  Patient presents with  . Cough   Still cough, no SOB. REVIEW OF SYSTEMS:  Review of Systems  Constitutional: Positive for malaise/fatigue. Negative for chills and fever.  HENT: Negative for sore throat.   Eyes: Negative for blurred vision and double vision.  Respiratory: Positive for cough. Negative for hemoptysis, shortness of breath, wheezing and stridor.   Cardiovascular: Negative for chest pain, palpitations, orthopnea and leg swelling.  Gastrointestinal: Positive for nausea. Negative for abdominal pain, blood in stool, constipation, diarrhea, melena and vomiting.  Genitourinary: Negative for dysuria, flank pain and hematuria.  Musculoskeletal: Negative for back pain and joint pain.  Skin: Negative for rash.  Neurological: Negative for dizziness, sensory change, focal weakness, seizures, loss of consciousness, weakness and headaches.  Endo/Heme/Allergies: Negative for polydipsia.  Psychiatric/Behavioral: Negative for depression. The patient is not nervous/anxious.     DRUG ALLERGIES:   Allergies  Allergen Reactions  . Furosemide     rash  . Ibuprofen Swelling  . Sulfa Antibiotics Rash and Other (See Comments)    Reaction:  Burning of feet   . Tape Itching and Rash  . Thiazide-Type Diuretics Rash   VITALS:  Blood pressure (!) 116/56, pulse 95, temperature 99.7 F (37.6 C), resp. rate 18, height 5\' 3"  (1.6 m), weight 240 lb (108.9 kg), SpO2 96 %. PHYSICAL EXAMINATION:  Physical Exam  Constitutional: She is oriented to person, place, and time.  Morbid obesity.  HENT:  Head: Normocephalic.  Mouth/Throat: Oropharynx is clear and moist.  Eyes: Pupils are equal, round, and reactive to light. Conjunctivae and EOM are normal. No scleral icterus.  Neck: Normal range of motion. Neck  supple. No JVD present. No tracheal deviation present.  Cardiovascular: Normal rate, regular rhythm and normal heart sounds. Exam reveals no gallop.  No murmur heard. Pulmonary/Chest: Breath sounds normal. No respiratory distress. She has no wheezes. She has no rales.  Right basilar crackles.  Abdominal: Soft. Bowel sounds are normal. She exhibits no distension. There is no tenderness. There is no rebound.  Musculoskeletal: Normal range of motion. She exhibits no edema or tenderness.  Neurological: She is alert and oriented to person, place, and time. No cranial nerve deficit.  Skin: No rash noted. No erythema.  Psychiatric: She has a normal mood and affect.   LABORATORY PANEL:  Female CBC Recent Labs  Lab 06/29/17 0303  WBC 8.2  HGB 11.9*  HCT 38.4  PLT 214   ------------------------------------------------------------------------------------------------------------------ Chemistries  Recent Labs  Lab 06/28/17 0854 06/28/17 1818 06/29/17 0303  NA 135  --  140  K 2.5* 3.0* 3.2*  CL 98*  --  105  CO2 27  --  27  GLUCOSE 118*  --  107*  BUN 11  --  9  CREATININE 1.00  --  0.87  CALCIUM 8.7*  --  8.2*  MG 1.7 1.9  --   AST 44*  --   --   ALT 64*  --   --   ALKPHOS 218*  --   --   BILITOT 0.4  --   --    RADIOLOGY:  No results found. ASSESSMENT AND PLAN:   Sepsis, possible due to acute bronchitis.. Continue cefepime and discontinued vancomycin(MRSA negative).  Follow-up respirator panel and cultures.Robitussin and nebulizer as needed.  Hypokalemia.  Given IV potassium in ED and continue home potassium, follow-up potassium level and normal magnesium level.  CML, on chemotherapy, hold home chemotherapy medication.  resume after discharge per Dr. Grayland Ormond.  Morbid obesity.  All the records are reviewed and case discussed with Care Management/Social Worker. Management plans discussed with the patient, family and they are in agreement.  CODE STATUS: Full  Code  TOTAL TIME TAKING CARE OF THIS PATIENT: 27 minutes.   More than 50% of the time was spent in counseling/coordination of care: YES  POSSIBLE D/C IN 1-2 DAYS, DEPENDING ON CLINICAL CONDITION.   Demetrios Loll M.D on 06/29/2017 at 1:23 PM  Between 7am to 6pm - Pager - [phone]  After 6pm go to [email or web address] - [facility]

## 2017-06-29 NOTE — Progress Notes (Signed)
Pharmacy Antibiotic Note  Laura Chen is a 52 y.o. female admitted on 06/28/2017 with sepsis. Patient presenting in ED with fever and productive cough. Patient was last hospitalized in February for pneumonia and influenza. Pharmacy has been consulted for Vancomycin and Zosyn dosing. Patient received MD ordered Vacnomycin 2g and Zosyn in ED.   5/10 Vancomycin discontinued. PCT <0.10 X2  Plan: Continue  Cefepime 2g IV Q12hr.   Height: 5\' 3"  (160 cm) Weight: 240 lb (108.9 kg) IBW/kg (Calculated) : 52.4  Temp (24hrs), Avg:99.2 F (37.3 C), Min:98.8 F (37.1 C), Max:99.7 F (37.6 C)  Recent Labs  Lab 06/28/17 0854 06/28/17 1032 06/29/17 0303  WBC 10.4  --  8.2  CREATININE 1.00  --  0.87  LATICACIDVEN  --  1.5  --     Estimated Creatinine Clearance: 92.6 mL/min (by C-G formula based on SCr of 0.87 mg/dL).    Allergies  Allergen Reactions  . Furosemide     rash  . Ibuprofen Swelling  . Sulfa Antibiotics Rash and Other (See Comments)    Reaction:  Burning of feet   . Tape Itching and Rash  . Thiazide-Type Diuretics Rash    Antimicrobials this admission: Vancomycin 5/9 >>  Cefepime 5/9 >> Zosyn 5/9 x 1   Dose adjustments this admission: N/A  Microbiology results: 5/9 BCx: pending  5/9 MRSA PCR: (-)   Thank you for allowing pharmacy to be a part of this patient's care.  Pernell Dupre, PharmD, BCPS Clinical Pharmacist 06/29/2017 3:26 PM

## 2017-06-30 LAB — RESPIRATORY PANEL BY PCR

## 2017-06-30 LAB — BASIC METABOLIC PANEL
Anion gap: 6 (ref 5–15)
BUN: 7 mg/dL (ref 6–20)
CO2: 27 mmol/L (ref 22–32)
Calcium: 8.2 mg/dL — ABNORMAL LOW (ref 8.9–10.3)
Chloride: 109 mmol/L (ref 101–111)
Creatinine, Ser: 0.85 mg/dL (ref 0.44–1.00)
GFR calc Af Amer: >60 mL/min (ref >60)
GFR calc non Af Amer: >60 mL/min (ref >60)
Glucose, Bld: 89 mg/dL (ref 65–99)
Potassium: 3.5 mmol/L (ref 3.5–5.1)
Sodium: 142 mmol/L (ref 135–145)

## 2017-06-30 LAB — PHOSPHORUS: Phosphorus: 2.7 mg/dL (ref 2.5–4.6)

## 2017-06-30 LAB — MAGNESIUM: Magnesium: 2.1 mg/dL (ref 1.7–2.4)

## 2017-06-30 LAB — PROCALCITONIN: Procalcitonin: <0.1 ng/mL

## 2017-06-30 MED ORDER — CEFUROXIME AXETIL 250 MG PO TABS: 250.0000 mg | ORAL_TABLET | Freq: Two times a day (BID) | ORAL | 0 refills | Status: AC

## 2017-06-30 NOTE — Progress Notes (Signed)
Patient is being discharged home this afternoon. DC & RX instructions given and patient acknowledged understanding. IVs removed. NT to help patient pack belongings and get her ready for transport via private vehicle.

## 2017-06-30 NOTE — Progress Notes (Signed)
Pharmacy Electrolyte Monitoring Consult:  Pharmacy consulted to assist in monitoring and replacing electrolytes in this 52 y.o. female admitted on 06/28/2017 with Cough   Labs:  Sodium (mmol/L)  Date Value  06/30/2017 142  06/08/2014 135   Potassium (mmol/L)  Date Value  06/30/2017 3.5  06/08/2014 2.9 (L)  10/11/2010 3.8   Magnesium (mg/dL)  Date Value  06/30/2017 2.1  06/08/2014 2.1   Phosphorus (mg/dL)  Date Value  06/30/2017 2.7  06/08/2014 2.7   Calcium (mg/dL)  Date Value  06/30/2017 8.2 (L)  10/11/2010 8.0   Calcium, Total (mg/dL)  Date Value  06/08/2014 8.8 (L)   Albumin (g/dL)  Date Value  06/28/2017 3.8  06/08/2014 3.7    Assessment/Plan: K: 3.5 Will continue with MD ordered potassium 10mEq PO BID (Patient's home dose). Continue magnesium oxide 400mg  PO BID.   Will recheck electrolytes with am labs.    Pharmacy will continue to monitor and adjust per consult.   Pernell Dupre, PharmD, BCPS Clinical Pharmacist 06/30/2017 8:44 AM

## 2017-07-03 ENCOUNTER — Other Ambulatory Visit: Payer: Self-pay | Admitting: Nurse Practitioner

## 2017-07-03 LAB — CULTURE, BLOOD (ROUTINE X 2)
Culture: NO GROWTH
Culture: NO GROWTH
Special Requests: ADEQUATE

## 2017-07-03 MED ORDER — ACCU-CHEK FASTCLIX LANCETS MISC: 1 refills | Status: DC

## 2017-07-03 MED ORDER — ACCU-CHEK SMARTVIEW VI STRP: ORAL_STRIP | 3 refills | Status: DC

## 2017-07-04 ENCOUNTER — Ambulatory Visit (INDEPENDENT_AMBULATORY_CARE_PROVIDER_SITE_OTHER): Payer: Medicare Other | Admitting: Internal Medicine

## 2017-07-04 ENCOUNTER — Encounter: Payer: Self-pay | Admitting: Internal Medicine

## 2017-07-04 VITALS — BP 128/69 | HR 73 | Resp 16 | Ht 63.0 in | Wt 238.6 lb

## 2017-07-04 DIAGNOSIS — J449 Chronic obstructive pulmonary disease, unspecified: Secondary | ICD-10-CM

## 2017-07-04 DIAGNOSIS — E069 Thyroiditis, unspecified: Secondary | ICD-10-CM | POA: Diagnosis not present

## 2017-07-04 DIAGNOSIS — B3731 Acute candidiasis of vulva and vagina: Secondary | ICD-10-CM

## 2017-07-04 DIAGNOSIS — E119 Type 2 diabetes mellitus without complications: Secondary | ICD-10-CM | POA: Diagnosis not present

## 2017-07-04 DIAGNOSIS — B373 Candidiasis of vulva and vagina: Secondary | ICD-10-CM | POA: Diagnosis not present

## 2017-07-04 DIAGNOSIS — M0579 Rheumatoid arthritis with rheumatoid factor of multiple sites without organ or systems involvement: Secondary | ICD-10-CM

## 2017-07-04 MED ORDER — ACCU-CHEK FASTCLIX LANCETS MISC: 3 refills | Status: DC

## 2017-07-04 MED ORDER — FLUCONAZOLE 150 MG PO TABS: ORAL_TABLET | ORAL | 0 refills | Status: DC

## 2017-07-04 MED ORDER — ACCU-CHEK SMARTVIEW VI STRP: ORAL_STRIP | 3 refills | Status: DC

## 2017-07-04 NOTE — Progress Notes (Signed)
[facility] [place], [place] [address]  Internal MEDICINE  Office Visit Note  Patient Name: Laura Chen  [id number]  [id number]  Date of Service: 07/04/2017  Chief Complaint  Patient presents with  . Cough    hospital follow up  . Fever    HPI  Pt is here for routine follow up. Pt was admitted with cough and congestion with working dx of sepsis, she has CML on home chemo medicine. Pt also has RA with changes in her joints. She has been in the hospital twice. Not sure if she has rheumatoid lung disease. Dm under good control. Pt was given abx and steroids and was discharged home after 2 days    Current Medication: Outpatient Encounter Medications as of 07/04/2017  Medication Sig  . ACCU-CHEK FASTCLIX LANCETS MISC USE TO TEST BLOOD SUGAR QD UTD  . ACCU-CHEK SMARTVIEW test strip TEST UTD ONCE D  . aspirin EC 81 MG tablet Take 81 mg by mouth every evening.   . baclofen (LIORESAL) 10 MG tablet Take 10 mg by mouth 3 (three) times daily.  . cetirizine (ZYRTEC) 10 MG tablet Take 10 mg by mouth.  . chlorthalidone (HYGROTON) 25 MG tablet Take 1 tablet (25 mg total) by mouth daily.  . cholecalciferol (VITAMIN D) 1000 units tablet Take 2,000 Units by mouth daily.  . DULoxetine (CYMBALTA) 20 MG capsule Take 1 capsule (20 mg total) by mouth daily.  Marland Kitchen ethacrynic acid (EDECRIN) 25 MG tablet Take 1 tablet by mouth daily.  . fluticasone (FLONASE) 50 MCG/ACT nasal spray Place 1 spray into both nostrils daily.  Marland Kitchen guaiFENesin-dextromethorphan (ROBITUSSIN DM) 100-10 MG/5ML syrup Take 5 mLs by mouth every 4 (four) hours as needed for cough.  . linaclotide (LINZESS) 290 MCG CAPS capsule Take 1 capsule (290 mcg total) by mouth daily.  Marland Kitchen omeprazole (PRILOSEC) 20 MG capsule Take 1 capsule (20 mg total) by mouth daily.  . Oxycodone HCl 10 MG TABS Take 10 mg by mouth 3 (three) times daily as needed (for pain).  . ponatinib HCl (ICLUSIG) 15 MG tablet Take 30 mg by mouth daily.   . potassium chloride SA (K-DUR,KLOR-CON) 20 MEQ tablet Take 2 tablets (40 mEq total) by mouth 2 (two) times daily. (Patient taking differently: Take 60 mEq by mouth 2 (two) times daily. )  . predniSONE (DELTASONE) 5 MG tablet Take 5 mg by mouth daily with breakfast.  . rosuvastatin (CRESTOR) 5 MG tablet Take 1 tablet (5 mg total) by mouth daily with supper.  . traMADol (ULTRAM) 50 MG tablet Take 2 tablets (100 mg total) by mouth 2 (two) times daily.  . [DISCONTINUED] ACCU-CHEK FASTCLIX LANCETS MISC USE TO TEST BLOOD SUGAR QD UTD  . [DISCONTINUED] ACCU-CHEK SMARTVIEW test strip TEST UTD ONCE D  . fluconazole (DIFLUCAN) 150 MG tablet Take one tab po qd x3 days and the prn   No facility-administered encounter medications on file as of 07/04/2017.     Surgical History: Past Surgical History:  Procedure Laterality Date  . DEXA  2010   osteopenia  . MYOMECTOMY  2008  . right ankle AFO  2011  . TRANSTHORACIC ECHOCARDIOGRAM  10/8336   nl systolic/diastolic fxn, EF 25%, mild MR, normal PA pressures  . US/HSG  05/2010   possible endometrial polyp, rec rpt 8 wks continue loestrin 24 [facility])    Medical History: Past Medical History:  Diagnosis Date  . Asthma   . CML (chronic myeloid leukemia) ([place]) 2011   (  Dr. Alyse Low) stopped gleevec 2/2 side effects  . Diabetes mellitus without complication ([place])   . GERD (gastroesophageal reflux disease)   . History of shingles   . History of syphilis 1990   s/p treatment  . Hypertension   . Leukemia ([place])   . Osteopenia    due to prednisone use, was on reclast  . PUD (peptic ulcer disease)   . Rheumatoid arthritis(714.0) 1990s   Jacoud's arthropathy, previously treated with MTX, TNFa (remicade, enbrel, humira, orencia, rituximab, and actemra)  . SLE (systemic lupus erythematosus) ([place]) 09/2009   Rheum-Dr. Joan Mayans  . Thyroid goiter    Endo-Dr. Eddie Dibbles    Family History: Family History  Problem Relation Age of Onset  . Heart attack  Maternal Grandmother   . Coronary artery disease Maternal Grandmother   . Hypertension Mother   . Hypertension Unknown        Aunt    Social History   Socioeconomic History  . Marital status: Single    Spouse name: Not on file  . Number of children: Not on file  . Years of education: Not on file  . Highest education level: Not on file  Occupational History  . Occupation: Secretary/administrator at [employer]  . Financial resource strain: Not on file  . Food insecurity:    Worry: Not on file    Inability: Not on file  . Transportation needs:    Medical: Not on file    Non-medical: Not on file  Tobacco Use  . Smoking status: Former Smoker    Packs/day: 0.30    Types: Cigarettes  . Smokeless tobacco: Never Used  . Tobacco comment: 6 cigarettes/day  Substance and Sexual Activity  . Alcohol use: No  . Drug use: No  . Sexual activity: Not on file  Lifestyle  . Physical activity:    Days per week: Not on file    Minutes per session: Not on file  . Stress: Not on file  Relationships  . Social connections:    Talks on phone: Not on file    Gets together: Not on file    Attends religious service: Not on file    Active member of club or organization: Not on file    Attends meetings of clubs or organizations: Not on file    Relationship status: Not on file  . Intimate partner violence:    Fear of current or ex partner: Not on file    Emotionally abused: Not on file    Physically abused: Not on file    Forced sexual activity: Not on file  Other Topics Concern  . Not on file  Social History Narrative   Caffeine: 1 cup/day coffee      Lives with mother, 1 dog      HS education   Review of Systems  Constitutional: Negative for chills, diaphoresis and fatigue.  HENT: Negative for ear pain, postnasal drip and sinus pressure.   Eyes: Negative for photophobia, discharge, redness, itching and visual disturbance.  Respiratory: Negative for cough, shortness of breath and  wheezing.   Cardiovascular: Negative for chest pain, palpitations and leg swelling.  Gastrointestinal: Negative for abdominal pain, constipation, diarrhea, nausea and vomiting.  Genitourinary: Negative for dysuria and flank pain.  Musculoskeletal: Negative for arthralgias, back pain, gait problem and neck pain.  Skin: Negative for color change.  Allergic/Immunologic: Negative for environmental allergies and food allergies.  Neurological: Negative for dizziness and headaches.  Hematological: Does not bruise/bleed  easily.  Psychiatric/Behavioral: Negative for agitation, behavioral problems (depression) and hallucinations.   Vital Signs: BP 128/69 (BP Location: Left Arm, Patient Position: Sitting, Cuff Size: Large)   Pulse 73   Resp 16   Ht 5\' 3"  (1.6 m)   Wt 238 lb 9.6 oz (108.2 kg)   SpO2 97%   BMI 42.27 kg/m    Physical Exam  Constitutional: She is oriented to person, place, and time. She appears well-developed and well-nourished. No distress.  HENT:  Head: Normocephalic and atraumatic.  Mouth/Throat: Oropharynx is clear and moist. No oropharyngeal exudate.  Eyes: Pupils are equal, round, and reactive to light. EOM are normal.  Neck: Normal range of motion. Neck supple. No JVD present. No tracheal deviation present. No thyromegaly present.  Cardiovascular: Normal rate, regular rhythm and normal heart sounds. Exam reveals no gallop and no friction rub.  No murmur heard. Pulmonary/Chest: Effort normal. No respiratory distress. She has no wheezes. She has no rales. She exhibits no tenderness.  Abdominal: Soft. Bowel sounds are normal.  Musculoskeletal: Normal range of motion.  Lymphadenopathy:    She has no cervical adenopathy.  Neurological: She is alert and oriented to person, place, and time. No cranial nerve deficit.  Skin: Skin is warm and dry. She is not diaphoretic.  Psychiatric: She has a normal mood and affect. Her behavior is normal. Judgment and thought content normal.    Assessment/Plan: 1. Chronic bronchitis with COPD (chronic obstructive pulmonary disease) (HCC) - Pulmonary Function Test; Future, samples of Symbicort 2 puffs bid is given   2. Rheumatoid arthritis involving multiple sites with positive rheumatoid factor ([place]) - Per Rheumatology  3. Vaginal candidiasis - fluconazole (DIFLUCAN) 150 MG tablet; Take one tab po qd x3 days and the prn  Dispense: 5 tablet; Refill: 0  4. Diabetes mellitus without complication ([place]) - ACCU-CHEK SMARTVIEW test strip; TEST UTD ONCE D  Dispense: 100 each; Refill: 3 - ACCU-CHEK FASTCLIX LANCETS MISC; USE TO TEST BLOOD SUGAR QD UTD  Dispense: 90 each; Refill: 3  General Counseling: Ilithyia verbalizes understanding of the findings of todays visit and agrees with plan of treatment. I have discussed any further diagnostic evaluation that may be needed or ordered today. We also reviewed her medications today. she has been encouraged to call the office with any questions or concerns that should arise related to todays visit.   Orders Placed This Encounter  Procedures  . Pulmonary Function Test    Meds ordered this encounter  Medications  . ACCU-CHEK SMARTVIEW test strip    Sig: TEST UTD ONCE D    Dispense:  100 each    Refill:  3  . ACCU-CHEK FASTCLIX LANCETS MISC    Sig: USE TO TEST BLOOD SUGAR QD UTD    Dispense:  90 each    Refill:  3  . fluconazole (DIFLUCAN) 150 MG tablet    Sig: Take one tab po qd x3 days and the prn    Dispense:  5 tablet    Refill:  0    Time spent:20 Minutes   Dr Lavera Guise Internal medicine

## 2017-07-07 NOTE — Discharge Summary (Signed)
[place] [facility] [place] NAME: Ruhee Enck    MR#:  [id number]  DATE OF BIRTH:  24-Aug-1965  DATE OF ADMISSION:  06/28/2017 ADMITTING PHYSICIAN: Demetrios Loll, MD  DATE OF DISCHARGE: 06/30/2017  1:06 PM  PRIMARY CARE PHYSICIAN: Lavera Guise, MD    ADMISSION DIAGNOSIS:  Cough [R05] Hypokalemia [E87.6] Fever, unspecified fever cause [R50.9]  DISCHARGE DIAGNOSIS:  Active Problems:   Sepsis ([place])   SECONDARY DIAGNOSIS:   Past Medical History:  Diagnosis Date  . Asthma   . CML (chronic myeloid leukemia) ([place]) 2011   (Dr. Alyse Low) stopped gleevec 2/2 side effects  . Diabetes mellitus without complication ([place])   . GERD (gastroesophageal reflux disease)   . History of shingles   . History of syphilis 1990   s/p treatment  . Hypertension   . Leukemia ([place])   . Osteopenia    due to prednisone use, was on reclast  . PUD (peptic ulcer disease)   . Rheumatoid arthritis(714.0) 1990s   Jacoud's arthropathy, previously treated with MTX, TNFa (remicade, enbrel, humira, orencia, rituximab, and actemra)  . SLE (systemic lupus erythematosus) ([place]) 09/2009   Rheum-Dr. Joan Mayans  . Thyroid goiter    Endo-Dr. Clarisa Fling COURSE:   Sepsis, possible due to acute bronchitis.. Continue cefepime and discontinued vancomycin(MRSA negative). Follow-up respirator panel and cultures.Robitussin and nebulizer as needed.  pt improved. D/c home.  Hypokalemia. Given IV potassium in ED and continue home potassium, follow-up potassium level and normal magnesium level.  CML, on chemotherapy, hold home chemotherapy medication. resume after discharge per Dr. Grayland Ormond.  Morbid obesity.    DISCHARGE CONDITIONS:   Stable.  CONSULTS OBTAINED:  Treatment Team:  Lloyd Huger, MD  DRUG ALLERGIES:   Allergies  Allergen Reactions  . Furosemide     rash  . Ibuprofen Swelling  . Sulfa Antibiotics Rash and Other (See Comments)     Reaction:  Burning of feet   . Tape Itching and Rash  . Thiazide-Type Diuretics Rash    DISCHARGE MEDICATIONS:   Allergies as of 06/30/2017      Reactions   Furosemide    rash   Ibuprofen Swelling   Sulfa Antibiotics Rash, Other (See Comments)   Reaction:  Burning of feet    Tape Itching, Rash   Thiazide-type Diuretics Rash      Medication List    TAKE these medications   aspirin EC 81 MG tablet Take 81 mg by mouth every evening.   baclofen 10 MG tablet Commonly known as:  LIORESAL Take 10 mg by mouth 3 (three) times daily.   cetirizine 10 MG tablet Commonly known as:  ZYRTEC Take 10 mg by mouth.   chlorthalidone 25 MG tablet Commonly known as:  HYGROTON Take 1 tablet (25 mg total) by mouth daily.   cholecalciferol 1000 units tablet Commonly known as:  VITAMIN D Take 2,000 Units by mouth daily.   DULoxetine 20 MG capsule Commonly known as:  CYMBALTA Take 1 capsule (20 mg total) by mouth daily.   ethacrynic acid 25 MG tablet Commonly known as:  EDECRIN Take 1 tablet by mouth daily.   fluticasone 50 MCG/ACT nasal spray Commonly known as:  FLONASE Place 1 spray into both nostrils daily.   guaiFENesin-dextromethorphan 100-10 MG/5ML syrup Commonly known as:  ROBITUSSIN DM Take 5 mLs by mouth every 4 (four) hours as needed for cough.   ICLUSIG 15 MG tablet Generic drug:  ponatinib  HCl Take 30 mg by mouth daily.   linaclotide 290 MCG Caps capsule Commonly known as:  LINZESS Take 1 capsule (290 mcg total) by mouth daily.   omeprazole 20 MG capsule Commonly known as:  PRILOSEC Take 1 capsule (20 mg total) by mouth daily.   Oxycodone HCl 10 MG Tabs Take 10 mg by mouth 3 (three) times daily as needed (for pain).   potassium chloride SA 20 MEQ tablet Commonly known as:  K-DUR,KLOR-CON Take 2 tablets (40 mEq total) by mouth 2 (two) times daily. What changed:  how much to take   predniSONE 5 MG tablet Commonly known as:  DELTASONE Take 5 mg by mouth  daily with breakfast.   rosuvastatin 5 MG tablet Commonly known as:  CRESTOR Take 1 tablet (5 mg total) by mouth daily with supper.   traMADol 50 MG tablet Commonly known as:  ULTRAM Take 2 tablets (100 mg total) by mouth 2 (two) times daily.     ASK your doctor about these medications   cefUROXime 250 MG tablet Commonly known as:  CEFTIN Take 1 tablet (250 mg total) by mouth 2 (two) times daily for 3 days. Ask about: Should I take this medication?        DISCHARGE INSTRUCTIONS:    Follow with PMD in 1-2 weeks.  If you experience worsening of your admission symptoms, develop shortness of breath, life threatening emergency, suicidal or homicidal thoughts you must seek medical attention immediately by calling 911 or calling your MD immediately  if symptoms less severe.  You Must read complete instructions/literature along with all the possible adverse reactions/side effects for all the Medicines you take and that have been prescribed to you. Take any new Medicines after you have completely understood and accept all the possible adverse reactions/side effects.   Please note  You were cared for by a hospitalist during your hospital stay. If you have any questions about your discharge medications or the care you received while you were in the hospital after you are discharged, you can call the unit and asked to speak with the hospitalist on call if the hospitalist that took care of you is not available. Once you are discharged, your primary care physician will handle any further medical issues. Please note that NO REFILLS for any discharge medications will be authorized once you are discharged, as it is imperative that you return to your primary care physician (or establish a relationship with a primary care physician if you do not have one) for your aftercare needs so that they can reassess your need for medications and monitor your lab values.    Today   CHIEF COMPLAINT:   Chief  Complaint  Patient presents with  . Cough    HISTORY OF PRESENT ILLNESS:  Nylan Nakatani  is a 52 y.o. female with a known history of medical problems as below.  The patient has CML on home chemo medication.  She has had cough with sputum and congestion for several days.  She had a fever 101.1 today and came to ED for further evaluation.  She was hospitalized in February for pneumonia and influenza.  Influenza test is negative today.  She was found tachycardia and tachypnea.  Dr. Mariea Clonts started sepsis protocol.  The patient was given antibiotics in the ED.  Dr. Mariea Clonts request admission for sepsis.   VITAL SIGNS:  Blood pressure 131/71, pulse 71, temperature 98.6 F (37 C), temperature source Oral, resp. rate 18, height 5\' 3"  (1.6 m),  weight 108.9 kg (240 lb), SpO2 100 %.  I/O:  No intake or output data in the 24 hours ending 07/07/17 1511  PHYSICAL EXAMINATION:   Constitutional: She is oriented to person, place, and time.  Morbid obesity.  HENT:  Head: Normocephalic.  Mouth/Throat: Oropharynx is clear and moist.  Eyes: Pupils are equal, round, and reactive to light. Conjunctivae and EOM are normal. No scleral icterus.  Neck: Normal range of motion. Neck supple. No JVD present. No tracheal deviation present.  Cardiovascular: Normal rate, regular rhythm and normal heart sounds. Exam reveals no gallop.  No murmur heard. Pulmonary/Chest: Breath sounds normal. No respiratory distress. She has no wheezes. She has no rales.  Right basilar crackles.  Abdominal: Soft. Bowel sounds are normal. She exhibits no distension. There is no tenderness. There is no rebound.  Musculoskeletal: Normal range of motion. She exhibits no edema or tenderness.  Neurological: She is alert and oriented to person, place, and time. No cranial nerve deficit.  Skin: No rash noted. No erythema.  Psychiatric: She has a normal mood and affect.     DATA REVIEW:   CBC No results for input(s): WBC, HGB, HCT, PLT in  the last 168 hours.  Chemistries  No results for input(s): NA, K, CL, CO2, GLUCOSE, BUN, CREATININE, CALCIUM, MG, AST, ALT, ALKPHOS, BILITOT in the last 168 hours.  Invalid input(s): GFRCGP  Cardiac Enzymes No results for input(s): TROPONINI in the last 168 hours.  Microbiology Results  Results for orders placed or performed during the hospital encounter of 06/28/17  Culture, blood (Routine x 2)     Status: None   Collection Time: 06/28/17  8:32 AM  Result Value Ref Range Status   Specimen Description BLOOD BLOOD RIGHT HAND  Final   Special Requests   Final    BOTTLES DRAWN AEROBIC AND ANAEROBIC Blood Culture results may not be optimal due to an excessive volume of blood received in culture bottles   Culture   Final    NO GROWTH 5 DAYS Performed at [facility], [address] [place], [place] [address]    Report Status 07/03/2017 FINAL  Final  Culture, blood (Routine x 2)     Status: None   Collection Time: 06/28/17  8:37 AM  Result Value Ref Range Status   Specimen Description BLOOD BLOOD LEFT WRIST  Final   Special Requests   Final    BOTTLES DRAWN AEROBIC AND ANAEROBIC Blood Culture adequate volume   Culture   Final    NO GROWTH 5 DAYS Performed at [facility], [address] [place], [place] [address]    Report Status 07/03/2017 FINAL  Final  MRSA PCR Screening     Status: None   Collection Time: 06/28/17 11:37 PM  Result Value Ref Range Status   MRSA by PCR NEGATIVE NEGATIVE Final    Comment:        The GeneXpert MRSA Assay (FDA approved for NASAL specimens only), is one component of a comprehensive MRSA colonization surveillance program. It is not intended to diagnose MRSA infection nor to guide or monitor treatment for MRSA infections. Performed at [facility], [address] [place], [place] [address]   Respiratory Panel by PCR     Status: Abnormal   Collection Time: 06/29/17 12:54 PM  Result Value Ref Range Status    Adenovirus NOT DETECTED NOT DETECTED Final   Coronavirus 229E NOT DETECTED NOT DETECTED Final   Coronavirus HKU1 NOT DETECTED NOT DETECTED  Final   Coronavirus NL63 NOT DETECTED NOT DETECTED Final   Coronavirus OC43 NOT DETECTED NOT DETECTED Final   Metapneumovirus DETECTED (A) NOT DETECTED Final   Rhinovirus / Enterovirus NOT DETECTED NOT DETECTED Final   Influenza A NOT DETECTED NOT DETECTED Final   Influenza B NOT DETECTED NOT DETECTED Final   Parainfluenza Virus 1 NOT DETECTED NOT DETECTED Final   Parainfluenza Virus 2 NOT DETECTED NOT DETECTED Final   Parainfluenza Virus 3 NOT DETECTED NOT DETECTED Final   Parainfluenza Virus 4 NOT DETECTED NOT DETECTED Final   Respiratory Syncytial Virus NOT DETECTED NOT DETECTED Final   Bordetella pertussis NOT DETECTED NOT DETECTED Final   Chlamydophila pneumoniae NOT DETECTED NOT DETECTED Final   Mycoplasma pneumoniae NOT DETECTED NOT DETECTED Final    Comment: Performed at [facility], [place] [address], [place], [place] [address]    RADIOLOGY:  No results found.  EKG:   Orders placed or performed during the hospital encounter of 06/28/17  . ED EKG 12-Lead  . ED EKG 12-Lead  . EKG 12-Lead  . EKG 12-Lead  . EKG 12-Lead  . EKG 12-Lead      Management plans discussed with the patient, family and they are in agreement.  CODE STATUS:  Code Status History    Date Active Date Inactive Code Status Order ID Comments User Context   06/28/2017 1729 06/30/2017 1611 Full Code [id number]  Demetrios Loll, MD Inpatient   04/14/2017 0355 04/15/2017 1527 Full Code [id number]  Harrie Foreman, MD Inpatient   04/30/2015 1954 05/02/2015 1847 Full Code [id number]  Epifanio Lesches, MD ED      TOTAL TIME TAKING CARE OF THIS PATIENT: 35 minutes.    Vaughan Basta M.D on 07/07/2017 at 3:11 PM  Between 7am to 6pm - Pager - [phone]  After 6pm go to [email or web address] - password [email or web address] [facility]  Office   [phone]  CC: Primary care physician; Lavera Guise, MD   Note: This dictation was prepared with Dragon dictation along with smaller phrase technology. Any transcriptional errors that result from this process are unintentional.

## 2017-07-08 NOTE — Progress Notes (Signed)
Laura Chen  [phone]) [phone] Fax:(336) [phone]  ID: [name] OB: 05-17-1965  MR#: [id number]  VOH#:[id number]  Patient Care Team: Lavera Guise, MD as PCP - General (Internal Medicine) Lavera Guise, MD (Internal Medicine) Christene Lye, MD (General Surgery)  CHIEF COMPLAINT: CML  INTERVAL HISTORY: Patient was recently see in consultation in the hospital after being admitted for community-acquired pneumonia.  Prior to that she had not been evaluated in clinic since October 10, 2016. Patient currently feels improved from her admission and is back to her baseline.  She has no neurologic complaints. She denies any fevers, night sweats, or weight loss.  She has chronic pain from her arthritis. She denies any chest pain, cough, hemoptysis, or shortness of breath. She has a fair appetite. She has no nausea, vomiting, constipation, or diarrhea.  She has no urinary complaints.  Patient offers no specific complaints today.  REVIEW OF SYSTEMS:   Review of Systems  Constitutional: Negative.  Negative for fever, malaise/fatigue and weight loss.  Respiratory: Negative.  Negative for cough and shortness of breath.   Cardiovascular: Negative.  Negative for chest pain and leg swelling.  Gastrointestinal: Negative.  Negative for abdominal pain and constipation.  Genitourinary: Negative.   Musculoskeletal: Positive for back pain, joint pain and neck pain.  Skin: Negative.  Negative for rash.  Neurological: Negative.  Negative for sensory change, focal weakness and weakness.  Psychiatric/Behavioral: Negative.  The patient is not nervous/anxious.     As per HPI. Otherwise, a complete review of systems is negative.  PAST MEDICAL HISTORY: Past Medical History:  Diagnosis Date  . Asthma   . CML (chronic myeloid leukemia) ([place]) 2011   (Dr. Alyse Low) stopped gleevec 2/2 side effects  . Diabetes mellitus without complication ([place])   . GERD (gastroesophageal  reflux disease)   . History of shingles   . History of syphilis 1990   s/p treatment  . Hypertension   . Leukemia ([place])   . Osteopenia    due to prednisone use, was on reclast  . PUD (peptic ulcer disease)   . Rheumatoid arthritis(714.0) 1990s   Jacoud's arthropathy, previously treated with MTX, TNFa (remicade, enbrel, humira, orencia, rituximab, and actemra)  . SLE (systemic lupus erythematosus) ([place]) 09/2009   Rheum-Dr. Joan Mayans  . Thyroid goiter    Endo-Dr. Eddie Dibbles    PAST SURGICAL HISTORY: Past Surgical History:  Procedure Laterality Date  . DEXA  2010   osteopenia  . MYOMECTOMY  2008  . right ankle AFO  2011  . TRANSTHORACIC ECHOCARDIOGRAM  07/9483   nl systolic/diastolic fxn, EF 46%, mild MR, normal PA pressures  . US/HSG  05/2010   possible endometrial polyp, rec rpt 8 wks continue loestrin 24 ([place])    FAMILY HISTORY Family History  Problem Relation Age of Onset  . Heart attack Maternal Grandmother   . Coronary artery disease Maternal Grandmother   . Hypertension Mother   . Hypertension Unknown        Aunt       ADVANCED DIRECTIVES:    HEALTH MAINTENANCE: Social History   Tobacco Use  . Smoking status: Former Smoker    Packs/day: 0.30    Types: Cigarettes  . Smokeless tobacco: Never Used  . Tobacco comment: 6 cigarettes/day  Substance Use Topics  . Alcohol use: No  . Drug use: No    Allergies  Allergen Reactions  . Furosemide     rash  . Ibuprofen Swelling  . Sulfa  Antibiotics Rash and Other (See Comments)    Reaction:  Burning of feet   . Tape Itching and Rash  . Thiazide-Type Diuretics Rash    Current Outpatient Medications  Medication Sig Dispense Refill  . aspirin EC 81 MG tablet Take 81 mg by mouth every evening.     . baclofen (LIORESAL) 10 MG tablet Take 10 mg by mouth 3 (three) times daily.    . cetirizine (ZYRTEC) 10 MG tablet Take 10 mg by mouth.    . chlorthalidone (HYGROTON) 25 MG tablet Take 1 tablet (25 mg total) by  mouth daily. 91 tablet 3  . cholecalciferol (VITAMIN D) 1000 units tablet Take 2,000 Units by mouth daily.    . DULoxetine (CYMBALTA) 20 MG capsule Take 1 capsule (20 mg total) by mouth daily. 90 capsule 2  . ethacrynic acid (EDECRIN) 25 MG tablet Take 1 tablet by mouth daily.  11  . fluticasone (FLONASE) 50 MCG/ACT nasal spray Place 1 spray into both nostrils daily. 16 g 3  . linaclotide (LINZESS) 290 MCG CAPS capsule Take 1 capsule (290 mcg total) by mouth daily. 90 capsule 2  . omeprazole (PRILOSEC) 20 MG capsule Take 1 capsule (20 mg total) by mouth daily. 90 capsule 2  . Oxycodone HCl 10 MG TABS Take 10 mg by mouth 3 (three) times daily as needed (for pain).    . ponatinib HCl (ICLUSIG) 15 MG tablet Take 15 mg by mouth daily.     . potassium chloride SA (K-DUR,KLOR-CON) 20 MEQ tablet Take 2 tablets (40 mEq total) by mouth 2 (two) times daily. (Patient taking differently: Take 20 mEq by mouth 2 (two) times daily. ) 120 tablet 0  . predniSONE (DELTASONE) 5 MG tablet Take 5 mg by mouth daily with breakfast.    . rosuvastatin (CRESTOR) 5 MG tablet Take 1 tablet (5 mg total) by mouth daily with supper. 90 tablet 2  . traMADol (ULTRAM) 50 MG tablet Take 2 tablets (100 mg total) by mouth 2 (two) times daily. 120 tablet 2  . ACCU-CHEK FASTCLIX LANCETS MISC USE TO TEST BLOOD SUGAR QD UTD (Patient not taking: Reported on 07/10/2017) 90 each 3  . ACCU-CHEK SMARTVIEW test strip TEST UTD ONCE D (Patient not taking: Reported on 07/10/2017) 100 each 3  . fluconazole (DIFLUCAN) 150 MG tablet Take one tab po qd x3 days and the prn (Patient not taking: Reported on 07/10/2017) 5 tablet 0  . guaiFENesin-dextromethorphan (ROBITUSSIN DM) 100-10 MG/5ML syrup Take 5 mLs by mouth every 4 (four) hours as needed for cough. (Patient not taking: Reported on 07/10/2017) 118 mL 0   No current facility-administered medications for this visit.     OBJECTIVE: Vitals:   07/10/17 1446  BP: (!) 141/88  Pulse: 76  Resp: 18    Temp: (!) 96.7 F (35.9 C)     Body mass index is 41.98 kg/m.    ECOG FS:1 - Symptomatic but completely ambulatory  General: Well-developed, well-nourished, no acute distress. Eyes: Pink conjunctiva, anicteric sclera. HEENT: Normocephalic, moist mucous membranes, clear oropharnyx. Lungs: Clear to auscultation bilaterally. Heart: Regular rate and rhythm. No rubs, murmurs, or gallops. Abdomen: Soft, nontender, nondistended. No organomegaly noted, normoactive bowel sounds. Musculoskeletal: No edema, cyanosis, or clubbing. Neuro: Alert, answering all questions appropriately. Cranial nerves grossly intact. Skin: No rashes or petechiae noted. Psych: Normal affect. Lymphatics: No cervical, calvicular, axillary or inguinal LAD.   LAB RESULTS:  Lab Results  Component Value Date   NA 140 07/10/2017  K 3.8 07/10/2017   CL 105 07/10/2017   CO2 25 07/10/2017   GLUCOSE 135 (H) 07/10/2017   BUN 14 07/10/2017   CREATININE 1.07 (H) 07/10/2017   CALCIUM 8.9 07/10/2017   PROT 7.1 07/10/2017   ALBUMIN 3.9 07/10/2017   AST 42 (H) 07/10/2017   ALT 59 (H) 07/10/2017   ALKPHOS 191 (H) 07/10/2017   BILITOT 0.4 07/10/2017   GFRNONAA 60 (L) 07/10/2017   GFRAA >60 07/10/2017    Lab Results  Component Value Date   WBC 9.6 07/10/2017   NEUTROABS 6.5 07/10/2017   HGB 11.9 (L) 07/10/2017   HCT 38.5 07/10/2017   MCV 75.5 (L) 07/10/2017   PLT 318 07/10/2017     STUDIES: Dg Chest 2 View  Result Date: 06/28/2017 CLINICAL DATA:  Cough and congestion with body aches. History of leukemia. EXAM: CHEST - 2 VIEW COMPARISON:  Chest x-ray dated April 13, 2017. FINDINGS: The heart size and mediastinal contours are within normal limits. Normal pulmonary vascularity. Unchanged eventration of the anterior right hemidiaphragm with blunting of the right costophrenic angle. Bronchitic changes appear slightly worsened when compared to prior study. Stable scarring/atelectasis at the left lung base. No focal  consolidation, pleural effusion, or pneumothorax. No acute osseous abnormality. IMPRESSION: Slightly worsened bronchitic changes.  No consolidation. Electronically Signed   By: Titus Dubin M.D.   On: 06/28/2017 09:35    ASSESSMENT: CML  PLAN:    1.  CML:  Previously, patient could not tolerate Gleevec, Sprycel, Tasigna, and Bosulif.  Currently she is taking 50 mg ponatinib (Iclusig) and tolerating it well.  Her most recent laboratory work [facility] reported continued molecular remission.  No intervention is needed at this time.  Continue follow-up with [facility] as scheduled.  Return to clinic in 6 months for repeat laboratory work and further evaluation.  2.  Rheumatoid arthritis: Continue 2 infusions of Rituxan every 6 months per [facility] rheumatology. 3.  Hypertension: Blood pressure is mildly elevated today, continue current treatment regimen. 4.  History of cavitary lung lesions: Rheumatologic nodules, treatment as above. 5.  Pneumonia: Resolved.  Patient has now completed her course of antibiotics.  Approximately 30 minutes was spent in discussion of which greater than 50% was consultation.    Patient expressed understanding and was in agreement with this plan. She also understands that She can call clinic at any time with any questions, concerns, or complaints.     Lloyd Huger, MD   07/13/2017 1:49 PM

## 2017-07-09 ENCOUNTER — Telehealth: Payer: Self-pay

## 2017-07-09 LAB — THYROGLOBULIN LEVEL: Thyroglobulin (TG-RIA): 29 ng/mL

## 2017-07-09 LAB — TSH+T4F+T3FREE
Free T4: 1.02 ng/dL (ref 0.82–1.77)
T3, Free: 3.3 pg/mL (ref 2.0–4.4)
TSH: 3.23 u[IU]/mL (ref 0.450–4.500)

## 2017-07-09 LAB — LIPID PANEL WITH LDL/HDL RATIO
Cholesterol, Total: 176 mg/dL (ref 100–199)
HDL: 63 mg/dL (ref >39)
LDL Calculated: 82 mg/dL (ref 0–99)
LDl/HDL Ratio: 1.3 ratio (ref 0.0–3.2)
Triglycerides: 156 mg/dL — ABNORMAL HIGH (ref 0–149)
VLDL Cholesterol Cal: 31 mg/dL (ref 5–40)

## 2017-07-09 NOTE — Telephone Encounter (Signed)
Spoke to pt and advised labs good.  dbs

## 2017-07-09 NOTE — Progress Notes (Signed)
Labs ok. Will continue to monitor her thyroid levels

## 2017-07-10 ENCOUNTER — Other Ambulatory Visit: Payer: Self-pay

## 2017-07-10 ENCOUNTER — Inpatient Hospital Stay (HOSPITAL_BASED_OUTPATIENT_CLINIC_OR_DEPARTMENT_OTHER): Payer: Medicare Other | Admitting: Oncology

## 2017-07-10 ENCOUNTER — Inpatient Hospital Stay: Payer: Medicare Other | Attending: Oncology

## 2017-07-10 ENCOUNTER — Encounter: Payer: Self-pay | Admitting: Oncology

## 2017-07-10 VITALS — BP 141/88 | HR 76 | Temp 96.7°F | Resp 18 | Wt 237.0 lb

## 2017-07-10 DIAGNOSIS — C9210 Chronic myeloid leukemia, BCR/ABL-positive, not having achieved remission: Secondary | ICD-10-CM

## 2017-07-10 DIAGNOSIS — M069 Rheumatoid arthritis, unspecified: Secondary | ICD-10-CM

## 2017-07-10 DIAGNOSIS — I10 Essential (primary) hypertension: Secondary | ICD-10-CM

## 2017-07-10 LAB — COMPREHENSIVE METABOLIC PANEL
ALT: 59 U/L — ABNORMAL HIGH (ref 14–54)
AST: 42 U/L — ABNORMAL HIGH (ref 15–41)
Albumin: 3.9 g/dL (ref 3.5–5.0)
Alkaline Phosphatase: 191 U/L — ABNORMAL HIGH (ref 38–126)
Anion gap: 10 (ref 5–15)
BUN: 14 mg/dL (ref 6–20)
CO2: 25 mmol/L (ref 22–32)
Calcium: 8.9 mg/dL (ref 8.9–10.3)
Chloride: 105 mmol/L (ref 101–111)
Creatinine, Ser: 1.07 mg/dL — ABNORMAL HIGH (ref 0.44–1.00)
GFR calc Af Amer: >60 mL/min (ref >60)
GFR calc non Af Amer: 60 mL/min — ABNORMAL LOW (ref >60)
Glucose, Bld: 135 mg/dL — ABNORMAL HIGH (ref 65–99)
Potassium: 3.8 mmol/L (ref 3.5–5.1)
Sodium: 140 mmol/L (ref 135–145)
Total Bilirubin: 0.4 mg/dL (ref 0.3–1.2)
Total Protein: 7.1 g/dL (ref 6.5–8.1)

## 2017-07-10 LAB — CBC WITH DIFFERENTIAL/PLATELET
Basophils Absolute: 0.1 10*3/uL (ref 0–0.1)
Basophils Relative: 1 %
Eosinophils Absolute: 0.1 10*3/uL (ref 0–0.7)
Eosinophils Relative: 1 %
HCT: 38.5 % (ref 35.0–47.0)
Hemoglobin: 11.9 g/dL — ABNORMAL LOW (ref 12.0–16.0)
Lymphocytes Relative: 18 %
Lymphs Abs: 1.7 10*3/uL (ref 1.0–3.6)
MCH: 23.4 pg — ABNORMAL LOW (ref 26.0–34.0)
MCHC: 30.9 g/dL — ABNORMAL LOW (ref 32.0–36.0)
MCV: 75.5 fL — ABNORMAL LOW (ref 80.0–100.0)
Monocytes Absolute: 1.1 10*3/uL — ABNORMAL HIGH (ref 0.2–0.9)
Monocytes Relative: 12 %
Neutro Abs: 6.5 10*3/uL (ref 1.4–6.5)
Neutrophils Relative %: 68 %
Platelets: 318 10*3/uL (ref 150–440)
RBC: 5.1 MIL/uL (ref 3.80–5.20)
RDW: 23 % — ABNORMAL HIGH (ref 11.5–14.5)
WBC: 9.6 10*3/uL (ref 3.6–11.0)

## 2017-07-10 LAB — MAGNESIUM: Magnesium: 1.9 mg/dL (ref 1.7–2.4)

## 2017-07-10 NOTE — Progress Notes (Signed)
Here for follow up and per pt " im feeling just fine "

## 2017-07-17 ENCOUNTER — Encounter
Admit: 2017-07-17 | Discharge: 2017-07-18 | Payer: MEDICARE | Attending: Obstetrics & Gynecology | Primary: Obstetrics & Gynecology

## 2017-07-17 DIAGNOSIS — N939 Abnormal uterine and vaginal bleeding, unspecified: Principal | ICD-10-CM

## 2017-07-17 LAB — BCR-ABL1, CML/ALL, PCR, QUANT

## 2017-07-18 ENCOUNTER — Ambulatory Visit (INDEPENDENT_AMBULATORY_CARE_PROVIDER_SITE_OTHER): Payer: Medicare Other | Admitting: Internal Medicine

## 2017-07-18 DIAGNOSIS — R0602 Shortness of breath: Secondary | ICD-10-CM | POA: Diagnosis not present

## 2017-07-18 LAB — PULMONARY FUNCTION TEST

## 2017-07-19 ENCOUNTER — Encounter: Payer: Self-pay | Admitting: Internal Medicine

## 2017-07-19 NOTE — Procedures (Signed)
[place], [address]  DATE OF SERVICE: Jul 18, 2017  Complete Pulmonary Function Testing Interpretation:  FINDINGS:  Forced vital capacity is normal the FEV1 is normal FEV1 FVC ratio is normal.  Postbronchodilator no significant change in the FEV1 however clinical improvement may occur in the absence of spirometric improvement.  Total lung capacity is normal residual volume is normal residual volume total lung capacity ratio is decreased FRC is normal.  The DLCO is moderately decreased and remains decreased corrected for alveolar volume.  IMPRESSION:  Spirometry and lung volumes are within normal limits. DLCO is moderately decreased Clinical correlation recommended  Allyne Gee, MD [facility] Pulmonary Critical Care Medicine Sleep Medicine

## 2017-07-23 ENCOUNTER — Encounter: Payer: Self-pay | Admitting: Oncology

## 2017-07-24 ENCOUNTER — Encounter: Payer: Self-pay | Admitting: Oncology

## 2017-07-25 ENCOUNTER — Other Ambulatory Visit: Payer: Self-pay | Admitting: Internal Medicine

## 2017-07-25 MED ORDER — POTASSIUM CHLORIDE CRYS ER 20 MEQ PO TBCR: 20.0000 meq | EXTENDED_RELEASE_TABLET | Freq: Two times a day (BID) | ORAL | 2 refills | Status: DC

## 2017-07-30 ENCOUNTER — Ambulatory Visit: Payer: Self-pay | Admitting: Internal Medicine

## 2017-08-03 MED ORDER — OXYCODONE 10 MG TABLET
ORAL_TABLET | Freq: Three times a day (TID) | ORAL | 0 refills | 0 days | Status: CP | PRN
Start: 2017-08-03 — End: 2017-09-04

## 2017-08-14 ENCOUNTER — Encounter
Admit: 2017-08-14 | Discharge: 2017-08-15 | Payer: MEDICARE | Attending: Cardiovascular Disease | Primary: Cardiovascular Disease

## 2017-08-14 DIAGNOSIS — R079 Chest pain, unspecified: Secondary | ICD-10-CM | POA: Diagnosis not present

## 2017-08-14 DIAGNOSIS — I5032 Chronic diastolic (congestive) heart failure: Secondary | ICD-10-CM | POA: Diagnosis not present

## 2017-08-14 DIAGNOSIS — E119 Type 2 diabetes mellitus without complications: Secondary | ICD-10-CM | POA: Diagnosis not present

## 2017-08-14 DIAGNOSIS — K219 Gastro-esophageal reflux disease without esophagitis: Secondary | ICD-10-CM | POA: Diagnosis not present

## 2017-08-14 DIAGNOSIS — Z882 Allergy status to sulfonamides status: Secondary | ICD-10-CM | POA: Diagnosis not present

## 2017-08-14 DIAGNOSIS — I10 Essential (primary) hypertension: Secondary | ICD-10-CM | POA: Diagnosis not present

## 2017-08-14 DIAGNOSIS — E876 Hypokalemia: Secondary | ICD-10-CM | POA: Diagnosis not present

## 2017-08-14 DIAGNOSIS — Z7982 Long term (current) use of aspirin: Secondary | ICD-10-CM | POA: Diagnosis not present

## 2017-08-14 DIAGNOSIS — C9210 Chronic myeloid leukemia, BCR/ABL-positive, not having achieved remission: Secondary | ICD-10-CM | POA: Diagnosis not present

## 2017-08-14 DIAGNOSIS — M069 Rheumatoid arthritis, unspecified: Secondary | ICD-10-CM | POA: Diagnosis not present

## 2017-08-14 DIAGNOSIS — M059 Rheumatoid arthritis with rheumatoid factor, unspecified: Secondary | ICD-10-CM | POA: Diagnosis not present

## 2017-08-14 DIAGNOSIS — E785 Hyperlipidemia, unspecified: Secondary | ICD-10-CM | POA: Diagnosis not present

## 2017-08-14 DIAGNOSIS — I110 Hypertensive heart disease with heart failure: Secondary | ICD-10-CM | POA: Diagnosis not present

## 2017-08-14 DIAGNOSIS — Z87891 Personal history of nicotine dependence: Secondary | ICD-10-CM | POA: Diagnosis not present

## 2017-08-14 DIAGNOSIS — I5030 Unspecified diastolic (congestive) heart failure: Secondary | ICD-10-CM | POA: Diagnosis not present

## 2017-08-14 MED ORDER — POTASSIUM CHLORIDE ER 20 MEQ TABLET,EXTENDED RELEASE(PART/CRYST)
ORAL_TABLET | Freq: Every day | ORAL | 3 refills | 0 days | Status: CP
Start: 2017-08-14 — End: 2018-08-13

## 2017-08-14 MED ORDER — SPIRONOLACTONE 25 MG TABLET
ORAL_TABLET | Freq: Every day | ORAL | 3 refills | 0.00000 days | Status: CP
Start: 2017-08-14 — End: 2017-08-22

## 2017-08-20 ENCOUNTER — Other Ambulatory Visit: Payer: Self-pay

## 2017-08-20 DIAGNOSIS — M0579 Rheumatoid arthritis with rheumatoid factor of multiple sites without organ or systems involvement: Secondary | ICD-10-CM

## 2017-08-20 MED ORDER — TRAMADOL HCL 50 MG PO TABS: 100.0000 mg | ORAL_TABLET | Freq: Two times a day (BID) | ORAL | 3 refills | Status: DC

## 2017-08-22 MED ORDER — SPIRONOLACTONE 50 MG TABLET
ORAL_TABLET | Freq: Every day | ORAL | 3 refills | 0.00000 days | Status: CP
Start: 2017-08-22 — End: 2018-10-14

## 2017-08-29 ENCOUNTER — Ambulatory Visit: Admit: 2017-08-29 | Discharge: 2017-08-30 | Payer: MEDICARE | Attending: Rheumatology | Primary: Rheumatology

## 2017-08-29 DIAGNOSIS — Z7952 Long term (current) use of systemic steroids: Secondary | ICD-10-CM | POA: Diagnosis not present

## 2017-08-29 DIAGNOSIS — M329 Systemic lupus erythematosus, unspecified: Secondary | ICD-10-CM | POA: Diagnosis not present

## 2017-08-29 DIAGNOSIS — Z87891 Personal history of nicotine dependence: Secondary | ICD-10-CM | POA: Diagnosis not present

## 2017-08-29 DIAGNOSIS — I5032 Chronic diastolic (congestive) heart failure: Secondary | ICD-10-CM | POA: Diagnosis not present

## 2017-08-29 DIAGNOSIS — Z79899 Other long term (current) drug therapy: Secondary | ICD-10-CM | POA: Diagnosis not present

## 2017-08-29 DIAGNOSIS — R6884 Jaw pain: Secondary | ICD-10-CM | POA: Diagnosis not present

## 2017-08-29 DIAGNOSIS — Z882 Allergy status to sulfonamides status: Secondary | ICD-10-CM | POA: Diagnosis not present

## 2017-08-29 DIAGNOSIS — E785 Hyperlipidemia, unspecified: Secondary | ICD-10-CM | POA: Diagnosis not present

## 2017-08-29 DIAGNOSIS — C9210 Chronic myeloid leukemia, BCR/ABL-positive, not having achieved remission: Secondary | ICD-10-CM | POA: Diagnosis not present

## 2017-08-29 DIAGNOSIS — M1990 Unspecified osteoarthritis, unspecified site: Secondary | ICD-10-CM | POA: Diagnosis not present

## 2017-08-29 DIAGNOSIS — I110 Hypertensive heart disease with heart failure: Secondary | ICD-10-CM | POA: Diagnosis not present

## 2017-08-29 DIAGNOSIS — Z79891 Long term (current) use of opiate analgesic: Secondary | ICD-10-CM | POA: Diagnosis not present

## 2017-08-29 DIAGNOSIS — E119 Type 2 diabetes mellitus without complications: Secondary | ICD-10-CM | POA: Diagnosis not present

## 2017-08-29 DIAGNOSIS — Z7982 Long term (current) use of aspirin: Secondary | ICD-10-CM | POA: Diagnosis not present

## 2017-08-29 DIAGNOSIS — M059 Rheumatoid arthritis with rheumatoid factor, unspecified: Secondary | ICD-10-CM | POA: Diagnosis not present

## 2017-08-29 MED ORDER — PREDNISONE 5 MG TABLET
ORAL_TABLET | Freq: Every day | ORAL | 3 refills | 0 days | Status: CP
Start: 2017-08-29 — End: 2018-10-22

## 2017-08-30 ENCOUNTER — Encounter: Admit: 2017-08-30 | Discharge: 2017-08-30 | Payer: MEDICARE

## 2017-08-30 DIAGNOSIS — Z1231 Encounter for screening mammogram for malignant neoplasm of breast: Secondary | ICD-10-CM | POA: Diagnosis not present

## 2017-09-04 MED ORDER — OXYCODONE 10 MG TABLET
ORAL_TABLET | Freq: Three times a day (TID) | ORAL | 0 refills | 0 days | Status: CP | PRN
Start: 2017-09-04 — End: 2017-10-08

## 2017-09-05 ENCOUNTER — Encounter: Admit: 2017-09-05 | Discharge: 2017-09-06 | Payer: MEDICARE

## 2017-09-05 DIAGNOSIS — Z7952 Long term (current) use of systemic steroids: Secondary | ICD-10-CM | POA: Diagnosis not present

## 2017-09-05 DIAGNOSIS — Z780 Asymptomatic menopausal state: Secondary | ICD-10-CM | POA: Diagnosis not present

## 2017-09-06 ENCOUNTER — Other Ambulatory Visit: Admit: 2017-09-06 | Discharge: 2017-09-07 | Payer: MEDICARE

## 2017-09-06 ENCOUNTER — Encounter
Admit: 2017-09-06 | Discharge: 2017-09-07 | Payer: MEDICARE | Attending: Hematology & Oncology | Primary: Hematology & Oncology

## 2017-09-06 DIAGNOSIS — C9210 Chronic myeloid leukemia, BCR/ABL-positive, not having achieved remission: Secondary | ICD-10-CM | POA: Diagnosis not present

## 2017-09-11 ENCOUNTER — Other Ambulatory Visit: Payer: Self-pay | Admitting: Internal Medicine

## 2017-09-12 MED ORDER — AZATHIOPRINE 50 MG TABLET
ORAL_TABLET | Freq: Every day | ORAL | 11 refills | 0 days | Status: CP
Start: 2017-09-12 — End: 2017-12-10

## 2017-09-13 MED ORDER — BACLOFEN 10 MG TABLET
ORAL_TABLET | 2 refills | 0 days | Status: CP
Start: 2017-09-13 — End: ?

## 2017-10-02 ENCOUNTER — Ambulatory Visit: Payer: Medicare Other | Admitting: Internal Medicine

## 2017-10-02 ENCOUNTER — Encounter: Payer: Self-pay | Admitting: Internal Medicine

## 2017-10-02 VITALS — BP 138/88 | HR 104 | Resp 16 | Ht 64.0 in | Wt 246.0 lb

## 2017-10-02 DIAGNOSIS — M0579 Rheumatoid arthritis with rheumatoid factor of multiple sites without organ or systems involvement: Secondary | ICD-10-CM

## 2017-10-02 DIAGNOSIS — J449 Chronic obstructive pulmonary disease, unspecified: Secondary | ICD-10-CM

## 2017-10-02 NOTE — Progress Notes (Signed)
[facility] [place], [place] [address]  Pulmonary Sleep Medicine   Office Visit Note  Patient Name: Laura Chen DOB: 1965-08-17 MRN [id number]  Date of Service: 10/02/2017  Complaints/HPI: Pt here for follow up to PFT's.  She has an FEV1 of 2.77 in may that is improved from her 2015 FEV1 of 2.14.  She denies current issues.  She has a history of RA, Asthma, PNA, and SLE.  She denies hospitalizations since last visit.  She is on chronic steroids and is currently attempting to get approved for IgG therapy.    ROS  General: (-) fever, (-) chills, (-) night sweats, (-) weakness Skin: (-) rashes, (-) itching,. Eyes: (-) visual changes, (-) redness, (-) itching. Nose and Sinuses: (-) nasal stuffiness or itchiness, (-) postnasal drip, (-) nosebleeds, (-) sinus trouble. Mouth and Throat: (-) sore throat, (-) hoarseness. Neck: (-) swollen glands, (-) enlarged thyroid, (-) neck pain. Respiratory: - cough, (-) bloody sputum, - shortness of breath, - wheezing. Cardiovascular: - ankle swelling, (-) chest pain. Lymphatic: (-) lymph node enlargement. Neurologic: (-) numbness, (-) tingling. Psychiatric: (-) anxiety, (-) depression   Current Medication: Outpatient Encounter Medications as of 10/02/2017  Medication Sig  . ACCU-CHEK FASTCLIX LANCETS MISC USE TO TEST BLOOD SUGAR QD UTD  . ACCU-CHEK SMARTVIEW test strip TEST UTD ONCE D  . aspirin EC 81 MG tablet Take 81 mg by mouth every evening.   . baclofen (LIORESAL) 10 MG tablet Take 10 mg by mouth 3 (three) times daily.  . cetirizine (ZYRTEC) 10 MG tablet Take 10 mg by mouth.  . chlorthalidone (HYGROTON) 25 MG tablet Take 1 tablet (25 mg total) by mouth daily.  . cholecalciferol (VITAMIN D) 1000 units tablet Take 2,000 Units by mouth daily.  . DULoxetine (CYMBALTA) 20 MG capsule Take 1 capsule (20 mg total) by mouth daily.  Marland Kitchen ethacrynic acid (EDECRIN) 25 MG tablet Take 1 tablet by mouth daily.  . fluconazole  (DIFLUCAN) 150 MG tablet Take one tab po qd x3 days and the prn  . fluticasone (FLONASE) 50 MCG/ACT nasal spray USE 1 SPRAY IN BOTH  NOSTRILS DAILY.  Marland Kitchen guaiFENesin-dextromethorphan (ROBITUSSIN DM) 100-10 MG/5ML syrup Take 5 mLs by mouth every 4 (four) hours as needed for cough.  . linaclotide (LINZESS) 290 MCG CAPS capsule Take 1 capsule (290 mcg total) by mouth daily.  Marland Kitchen omeprazole (PRILOSEC) 20 MG capsule Take 1 capsule (20 mg total) by mouth daily.  . Oxycodone HCl 10 MG TABS Take 10 mg by mouth 3 (three) times daily as needed (for pain).  . ponatinib HCl (ICLUSIG) 15 MG tablet Take 15 mg by mouth daily.   . potassium chloride SA (K-DUR,KLOR-CON) 20 MEQ tablet Take 1 tablet (20 mEq total) by mouth 2 (two) times daily.  . predniSONE (DELTASONE) 5 MG tablet Take 5 mg by mouth daily with breakfast.  . rosuvastatin (CRESTOR) 5 MG tablet Take 1 tablet (5 mg total) by mouth daily with supper.  . traMADol (ULTRAM) 50 MG tablet Take 2 tablets (100 mg total) by mouth 2 (two) times daily.   No facility-administered encounter medications on file as of 10/02/2017.     Surgical History: Past Surgical History:  Procedure Laterality Date  . DEXA  2010   osteopenia  . MYOMECTOMY  2008  . right ankle AFO  2011  . TRANSTHORACIC ECHOCARDIOGRAM  08/4126   nl systolic/diastolic fxn, EF 78%, mild MR, normal PA pressures  . US/HSG  05/2010  possible endometrial polyp, rec rpt 8 wks continue loestrin 24 [facility])    Medical History: Past Medical History:  Diagnosis Date  . Asthma   . CML (chronic myeloid leukemia) ([place]) 2011   (Dr. Alyse Low) stopped gleevec 2/2 side effects  . Diabetes mellitus without complication ([place])   . GERD (gastroesophageal reflux disease)   . History of shingles   . History of syphilis 1990   s/p treatment  . Hypertension   . Leukemia ([place])   . Osteopenia    due to prednisone use, was on reclast  . PUD (peptic ulcer disease)   . Rheumatoid arthritis(714.0) 1990s    Jacoud's arthropathy, previously treated with MTX, TNFa (remicade, enbrel, humira, orencia, rituximab, and actemra)  . SLE (systemic lupus erythematosus) ([place]) 09/2009   Rheum-Dr. Joan Mayans  . Thyroid goiter    Endo-Dr. Eddie Dibbles    Family History: Family History  Problem Relation Age of Onset  . Heart attack Maternal Grandmother   . Coronary artery disease Maternal Grandmother   . Hypertension Mother   . Hypertension Unknown        Aunt    Social History: Social History   Socioeconomic History  . Marital status: Single    Spouse name: Not on file  . Number of children: Not on file  . Years of education: Not on file  . Highest education level: Not on file  Occupational History  . Occupation: Secretary/administrator at [employer]  . Financial resource strain: Not on file  . Food insecurity:    Worry: Not on file    Inability: Not on file  . Transportation needs:    Medical: Not on file    Non-medical: Not on file  Tobacco Use  . Smoking status: Former Smoker    Packs/day: 0.30    Types: Cigarettes  . Smokeless tobacco: Never Used  . Tobacco comment: 6 cigarettes/day  Substance and Sexual Activity  . Alcohol use: No  . Drug use: No  . Sexual activity: Not on file  Lifestyle  . Physical activity:    Days per week: Not on file    Minutes per session: Not on file  . Stress: Not on file  Relationships  . Social connections:    Talks on phone: Not on file    Gets together: Not on file    Attends religious service: Not on file    Active member of club or organization: Not on file    Attends meetings of clubs or organizations: Not on file    Relationship status: Not on file  . Intimate partner violence:    Fear of current or ex partner: Not on file    Emotionally abused: Not on file    Physically abused: Not on file    Forced sexual activity: Not on file  Other Topics Concern  . Not on file  Social History Narrative   Caffeine: 1 cup/day coffee      Lives  with mother, 1 dog      [employer] education    Vital Signs: Blood pressure 138/88, pulse (!) 104, resp. rate 16, height '5\' 4"'$  (1.626 m), weight 246 lb (111.6 kg), SpO2 99 %.  Examination: General Appearance: The patient is well-developed, well-nourished, and in no distress. Skin: Gross inspection of skin unremarkable. Head: normocephalic, no gross deformities. Eyes: no gross deformities noted. ENT: ears appear grossly normal no exudates. Neck: Supple. No thyromegaly. No LAD. Respiratory: Clear to auscultation bilateraly. Cardiovascular: Normal S1  and S2 without murmur or rub. Extremities: No cyanosis. pulses are equal. Neurologic: Alert and oriented. No involuntary movements.  LABS: Recent Results (from the past 2160 hour(s))  Magnesium     Status: None   Collection Time: 07/10/17  2:13 PM  Result Value Ref Range   Magnesium 1.9 1.7 - 2.4 mg/dL    Comment: Performed at [facility], [address] [place], [place] [address]  BCR-ABL1, CML/ALL, PCR, QUANT     Status: None   Collection Time: 07/10/17  2:13 PM  Result Value Ref Range   b2a2 transcript Comment %    Comment: (NOTE)           <0.0032 % (sensitivity limit of assay)    b3a2 transcript Comment %    Comment: (NOTE)           <0.0032 % (sensitivity limit of assay)    E1A2 Transcript Comment %    Comment: (NOTE)           <0.0032 % (sensitivity limit of assay)    Interpretation (BCRAL): Comment     Comment: (NOTE) NEGATIVE for the BCR-ABL1 e1a2 (p190), e13a2 (b2a2, p210) and e14a2 (b3a2, p210) fusion transcripts. These results do not rule out the presence of rare BCR-ABL1 transcripts not detected by this assay.    Director Review [facility]): Comment     Comment: (NOTE) Constance Goltz, PhD, [facility]               Director, Rafael Hernandez for [place] and [place], [place]    Background: Comment      Comment: (NOTE) This assay can detect three different types of BCR-ABL1 fusion transcripts associated with CML, ALL, and AML: e13a2 (previously b2a2) and e14a2 (previously b3a2) (major breakpoint, p210), as well as e1a2 (minor breakpoint, p190). The e13a2 and e14a2 transcript values are titrated to the current International Scale (IS). The standardized baseline is 100% BCR-ABL1 (IS) and major molecular response (MMR) is equivalent to 0.1% BCR-ABL1 (IS) corresponding to a 3-log reduction. Results should be correlated with appropriate clinical and laboratory information as indicated.    Methodology Comment     Comment: (NOTE) Total RNA is isolated from the sample and subject to a real-time, reverse transcriptase polymerase chain reaction (RT-PCR). The PCR primers and probes are specific for BCR-ABL1 e13a2, e14a2 and e1a2 fusion transcripts. The ABL1 transcript is amplified as the control for cDNA quantity and quality. Serial dilutions of a validated positive control RNA with known t(9;22) BCR-ABL1 are used as reference for quantification of BCR-ABL1 relative to ABL1. The numeric BCR-ABL1 level is reportd as % BCR-ABL1/ABL1 and the detection sensitivity is 4.5 log below the standard baseline. This test was developed and its performance characteristics determined by [facility]. It has not been cleared or approved by the Food and Drug Administration. References:    1. Anastasia Fiedler and Branford S: Seminars in Hematology 2003;       40 (suppl2):62-68.    2. White HE, et al. Blood 2010; 116: e111-117.    3. NCCN Clinical Practice Guidelines in Oncology, Chronic       Myeloid Leukemia. V2. 2017.  Performed At: [facility] [address] [place], [place] [id number] Nechama Guard MD [id number] Performed At: [facility] [address] [place], [place] [id number] Nechama Guard MD [id number] Performed at [facility], [address] [place], [place] [address]    CBC with Differential     Status: Abnormal   Collection Time: 07/10/17  2:13 PM  Result Value Ref Range   WBC 9.6 3.6 - 11.0 K/uL   RBC 5.10 3.80 - 5.20 MIL/uL   Hemoglobin 11.9 (L) 12.0 - 16.0 g/dL   HCT 38.5 35.0 - 47.0 %   MCV 75.5 (L) 80.0 - 100.0 fL   MCH 23.4 (L) 26.0 - 34.0 pg   MCHC 30.9 (L) 32.0 - 36.0 g/dL   RDW 23.0 (H) 11.5 - 14.5 %   Platelets 318 150 - 440 K/uL   Neutrophils Relative % 68 %   Neutro Abs 6.5 1.4 - 6.5 K/uL   Lymphocytes Relative 18 %   Lymphs Abs 1.7 1.0 - 3.6 K/uL   Monocytes Relative 12 %   Monocytes Absolute 1.1 (H) 0.2 - 0.9 K/uL   Eosinophils Relative 1 %   Eosinophils Absolute 0.1 0 - 0.7 K/uL   Basophils Relative 1 %   Basophils Absolute 0.1 0 - 0.1 K/uL    Comment: Performed at [facility], [address] [place], [place] [address]  Comprehensive metabolic panel     Status: Abnormal   Collection Time: 07/10/17  2:13 PM  Result Value Ref Range   Sodium 140 135 - 145 mmol/L   Potassium 3.8 3.5 - 5.1 mmol/L   Chloride 105 101 - 111 mmol/L   CO2 25 22 - 32 mmol/L   Glucose, Bld 135 (H) 65 - 99 mg/dL   BUN 14 6 - 20 mg/dL   Creatinine, Ser 1.07 (H) 0.44 - 1.00 mg/dL   Calcium 8.9 8.9 - 10.3 mg/dL   Total Protein 7.1 6.5 - 8.1 g/dL   Albumin 3.9 3.5 - 5.0 g/dL   AST 42 (H) 15 - 41 U/L   ALT 59 (H) 14 - 54 U/L   Alkaline Phosphatase 191 (H) 38 - 126 U/L   Total Bilirubin 0.4 0.3 - 1.2 mg/dL   GFR calc non Af Amer 60 (L) >60 mL/min   GFR calc Af Amer >60 >60 mL/min    Comment: (NOTE) The eGFR has been calculated using the CKD EPI equation. This calculation has not been validated in all clinical situations. eGFR's persistently <60 mL/min signify possible Chronic Kidney Disease.    Anion gap 10 5 - 15    Comment: Performed at [facility], [address] [place], [place] [address]  Pulmonary Function Test     Status: None   Collection Time: 07/18/17 12:00 PM  Result Value Ref Range   FEV1  liters   FVC  liters    FEV1/FVC  %   TLC     DLCO  ml/mmHg sec    Radiology: Dg Chest 2 View  Result Date: 06/28/2017 CLINICAL DATA:  Cough and congestion with body aches. History of leukemia. EXAM: CHEST - 2 VIEW COMPARISON:  Chest x-ray dated April 13, 2017. FINDINGS: The heart size and mediastinal contours are within normal limits. Normal pulmonary vascularity. Unchanged eventration of the anterior right hemidiaphragm with blunting of the right costophrenic angle. Bronchitic changes appear slightly worsened when compared to prior study. Stable scarring/atelectasis at the left lung base. No focal consolidation, pleural  effusion, or pneumothorax. No acute osseous abnormality. IMPRESSION: Slightly worsened bronchitic changes.  No consolidation. Electronically Signed   By: Titus Dubin M.D.   On: 06/28/2017 09:35    No results found.  No results found.    Assessment and Plan: Patient Active Problem List   Diagnosis Date Noted  . Sepsis ([place]) 06/28/2017  . Acute on chronic kidney failure ([place]) 04/14/2017  . (HFpEF) heart failure with preserved ejection fraction ([place]) 12/12/2016  . Other chest pain 12/12/2016  . Pneumonia 04/30/2015  . Acute renal failure (ARF) ([place]) 04/24/2015  . Elevated LFTs 04/24/2015  . Hyponatremia 04/24/2015  . Influenza A 04/24/2015  . Oral thrush 04/24/2015  . Pulmonary nodule 08/26/2014  . Iritis 04/30/2013  . Steroid responder, bilateral 04/30/2013  . Hemoptysis 11/02/2012  . Constipation 05/08/2012  . Complete rupture of rotator cuff 09/19/2011  . 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 Effusion of wrist 11/22/2010  . Hand joint pain 11/22/2010  . Polymenorrhea 10/28/2010  . Anemia 10/28/2010  . External otitis 10/27/2010  . Endometrial polyp 10/27/2010  . Menorrhagia 10/27/2010  . Right flank pain 10/12/2010  . Fatigue 06/23/2010  . Skin rash 06/07/2010  . Edema 06/07/2010  . Tobacco abuse 06/07/2010  . Tobacco dependence syndrome 06/07/2010  . Primary localized  osteoarthrosis, lower leg 05/11/2010  . Chronic myeloid leukemia ([place]) 03/24/2010  . ASTHMA 03/24/2010  . GERD 03/24/2010  . SYSTEMIC LUPUS ERYTHEMATOSUS 03/24/2010  . Rheumatoid arthritis ([place]) 03/24/2010  . FLATULENCE ERUCTATION AND GAS PAIN 03/24/2010  . Asthma 03/24/2010   1. Chronic bronchitis with COPD (chronic obstructive pulmonary disease) ([facility]) Pt not currently using any inhalers.  She reports very few episodes of DOE, she mostly contributes these to her RA and being tired.  They resolve quickly with rest, and she does not require a rescue inhaler or maintaince inhaler.  Discussed yearly follow up with patient. She agreed, and will call if she has a change in status.  2. Rheumatoid arthritis involving multiple sites with positive rheumatoid factor ([place]) Continue to be followed by [facility].  Continue current regimen.  3. Morbid obesity ([place]) Obesity Counseling: Risk Assessment: An assessment of behavioral risk factors was made today and includes lack of exercise sedentary lifestyle, lack of portion control and poor dietary habits.  Risk Modification Advice: She was counseled on portion control guidelines. Restricting daily caloric intake to. . The detrimental long term effects of obesity on her health and ongoing poor compliance was also discussed with the patient.     General Counseling: I have discussed the findings of the evaluation and examination with Andrea.  I have also discussed any further diagnostic evaluation thatmay be needed or ordered today. Malaijah verbalizes understanding of the findings of todays visit. We also reviewed her medications today and discussed drug interactions and side effects including but not limited excessive drowsiness and altered mental states. We also discussed that there is always a risk not just to her but also people around her. she has been encouraged to call the office with any questions or concerns that should arise related to todays  visit.    Time spent: 25 This patient was seen by Orson Gear AGNP-C in Collaboration with Dr. Devona Konig as a part of collaborative care agreement.  I have personally obtained a history, examined the patient, evaluated laboratory and imaging results, formulated the assessment and plan and placed orders.    Allyne Gee, MD [facility] Pulmonary and Critical Care Sleep medicine

## 2017-10-02 NOTE — Patient Instructions (Signed)

## 2017-10-08 MED ORDER — OXYCODONE 10 MG TABLET
ORAL_TABLET | Freq: Three times a day (TID) | ORAL | 0 refills | 0 days | Status: CP | PRN
Start: 2017-10-08 — End: 2017-12-10

## 2017-10-09 ENCOUNTER — Ambulatory Visit: Payer: Self-pay | Admitting: Adult Health

## 2017-10-10 DIAGNOSIS — E119 Type 2 diabetes mellitus without complications: Secondary | ICD-10-CM | POA: Diagnosis not present

## 2017-10-30 ENCOUNTER — Other Ambulatory Visit: Payer: Self-pay

## 2017-10-30 DIAGNOSIS — E119 Type 2 diabetes mellitus without complications: Secondary | ICD-10-CM

## 2017-10-30 MED ORDER — ACCU-CHEK FASTCLIX LANCETS MISC
3 refills | Status: DC
Start: 2017-10-30 — End: 2018-10-21

## 2017-11-01 ENCOUNTER — Encounter: Payer: Self-pay | Admitting: Nurse Practitioner

## 2017-11-01 ENCOUNTER — Ambulatory Visit (INDEPENDENT_AMBULATORY_CARE_PROVIDER_SITE_OTHER): Payer: Medicare Other | Admitting: Nurse Practitioner

## 2017-11-01 VITALS — BP 124/85 | HR 88 | Resp 16 | Ht 64.0 in | Wt 244.4 lb

## 2017-11-01 DIAGNOSIS — R319 Hematuria, unspecified: Secondary | ICD-10-CM

## 2017-11-01 DIAGNOSIS — R300 Dysuria: Secondary | ICD-10-CM | POA: Diagnosis not present

## 2017-11-01 DIAGNOSIS — N390 Urinary tract infection, site not specified: Secondary | ICD-10-CM | POA: Diagnosis not present

## 2017-11-01 DIAGNOSIS — F321 Major depressive disorder, single episode, moderate: Secondary | ICD-10-CM

## 2017-11-01 DIAGNOSIS — B373 Candidiasis of vulva and vagina: Secondary | ICD-10-CM | POA: Diagnosis not present

## 2017-11-01 DIAGNOSIS — B3731 Acute candidiasis of vulva and vagina: Secondary | ICD-10-CM

## 2017-11-01 LAB — POCT URINALYSIS DIPSTICK
Glucose, UA: NEGATIVE
Ketones, UA: NEGATIVE
Nitrite, UA: NEGATIVE
Protein, UA: POSITIVE — AB
Spec Grav, UA: 1.02 (ref 1.010–1.025)
Urobilinogen, UA: 0.2 E.U./dL
pH, UA: 6 (ref 5.0–8.0)

## 2017-11-01 MED ORDER — NITROFURANTOIN MONOHYD MACRO 100 MG PO CAPS: 100.0000 mg | ORAL_CAPSULE | Freq: Two times a day (BID) | ORAL | 0 refills | Status: DC

## 2017-11-01 MED ORDER — BUPROPION HCL ER (SR) 100 MG PO TB12: 100.0000 mg | ORAL_TABLET | Freq: Every day | ORAL | 3 refills | Status: DC

## 2017-11-01 MED ORDER — FLUCONAZOLE 150 MG PO TABS: ORAL_TABLET | ORAL | 1 refills | Status: DC

## 2017-11-01 MED ORDER — PHENAZOPYRIDINE HCL 200 MG PO TABS: 200.0000 mg | ORAL_TABLET | Freq: Three times a day (TID) | ORAL | 0 refills | Status: DC | PRN

## 2017-11-01 NOTE — Progress Notes (Signed)
[facility] [place], [place] [address]  Internal MEDICINE  Office Visit Note  Patient Name: Laura Chen  [id number]  [id number]  Date of Service: 11/11/2017   Pt is here for a sick visit.   Chief Complaint  Patient presents with  . Urinary Tract Infection    burning, pain been going on for two weeks  . Pain    back pain     The patient has had symptoms of UTI for about 2 weeks ago. She waited to be seen because she wasn't sure if symptoms weren't related to new medication for RA which can cause UTI type symptoms.  She also reports a worsening of her depression. Feels very tired and fatigued. Doesn't want to get out of bed. She has increased her duloxetine, on her own, to 40mg  daily. Discussed this with he rheumatologist. He increased duloxetine to 60mg  daily. Caused her to have severe headaches. Has cut dosing back to 40mg  daily. Negative side effects have resolved, however, she dose not feel like it is really helping her symptoms of depression at all. Feels uninterested in activities she used to enjoy and states that she doesn't really care.   Urinary Tract Infection   This is a new problem. The current episode started 1 to 4 weeks ago. The problem occurs every urination. The problem has been unchanged. The quality of the pain is described as burning and stabbing. The pain is at a severity of 5/10. The pain is moderate. There has been no fever. She is not sexually active. There is no history of pyelonephritis. Associated symptoms include flank pain, frequency, hematuria, nausea and urgency. Pertinent negatives include no vomiting. She has tried nothing for the symptoms.       Current Medication:  Outpatient Encounter Medications as of 11/01/2017  Medication Sig  . ACCU-CHEK FASTCLIX LANCETS MISC USE TO TEST BLOOD SUGAR QD UTD  . ACCU-CHEK SMARTVIEW test strip TEST UTD ONCE D  . aspirin EC 81 MG tablet Take 81 mg by mouth every evening.   Marland Kitchen  azaTHIOprine (IMURAN) 50 MG tablet Take 50 mg by mouth daily.  . baclofen (LIORESAL) 10 MG tablet Take 10 mg by mouth 3 (three) times daily.  . cetirizine (ZYRTEC) 10 MG tablet Take 10 mg by mouth.  . chlorthalidone (HYGROTON) 25 MG tablet Take 1 tablet (25 mg total) by mouth daily.  . cholecalciferol (VITAMIN D) 1000 units tablet Take 2,000 Units by mouth daily.  . DULoxetine (CYMBALTA) 20 MG capsule Take 1 capsule (20 mg total) by mouth daily.  Marland Kitchen ethacrynic acid (EDECRIN) 25 MG tablet Take 1 tablet by mouth daily.  . fluconazole (DIFLUCAN) 150 MG tablet Take one tab po qd x3 days and the prn  . fluticasone (FLONASE) 50 MCG/ACT nasal spray USE 1 SPRAY IN BOTH  NOSTRILS DAILY.  Marland Kitchen guaiFENesin-dextromethorphan (ROBITUSSIN DM) 100-10 MG/5ML syrup Take 5 mLs by mouth every 4 (four) hours as needed for cough.  . linaclotide (LINZESS) 290 MCG CAPS capsule Take 1 capsule (290 mcg total) by mouth daily.  Marland Kitchen omeprazole (PRILOSEC) 20 MG capsule Take 1 capsule (20 mg total) by mouth daily.  . Oxycodone HCl 10 MG TABS Take 10 mg by mouth 3 (three) times daily as needed (for pain).  . ponatinib HCl (ICLUSIG) 15 MG tablet Take 15 mg by mouth daily.   . potassium chloride SA (K-DUR,KLOR-CON) 20 MEQ tablet Take 1 tablet (20 mEq total) by mouth 2 (two) times daily.  Marland Kitchen  rosuvastatin (CRESTOR) 5 MG tablet Take 1 tablet (5 mg total) by mouth daily with supper.  . traMADol (ULTRAM) 50 MG tablet Take 2 tablets (100 mg total) by mouth 2 (two) times daily.  . [DISCONTINUED] fluconazole (DIFLUCAN) 150 MG tablet Take one tab po qd x3 days and the prn  . buPROPion (WELLBUTRIN SR) 100 MG 12 hr tablet Take 1 tablet (100 mg total) by mouth daily.  . nitrofurantoin, macrocrystal-monohydrate, (MACROBID) 100 MG capsule Take 1 capsule (100 mg total) by mouth 2 (two) times daily.  . phenazopyridine (PYRIDIUM) 200 MG tablet Take 1 tablet (200 mg total) by mouth 3 (three) times daily as needed for pain.  . predniSONE (DELTASONE) 5 MG  tablet Take 5 mg by mouth daily with breakfast.   No facility-administered encounter medications on file as of 11/01/2017.       Medical History: Past Medical History:  Diagnosis Date  . Asthma   . CML (chronic myeloid leukemia) ([place]) 2011   (Dr. Alyse Low) stopped gleevec 2/2 side effects  . Diabetes mellitus without complication ([place])   . GERD (gastroesophageal reflux disease)   . History of shingles   . History of syphilis 1990   s/p treatment  . Hypertension   . Leukemia ([place])   . Osteopenia    due to prednisone use, was on reclast  . PUD (peptic ulcer disease)   . Rheumatoid arthritis(714.0) 1990s   Jacoud's arthropathy, previously treated with MTX, TNFa (remicade, enbrel, humira, orencia, rituximab, and actemra)  . SLE (systemic lupus erythematosus) ([place]) 09/2009   Rheum-Dr. Joan Mayans  . Thyroid goiter    Endo-Dr. Eddie Dibbles     Today's Vitals   11/01/17 1520  BP: 124/85  Pulse: 88  Resp: 16  SpO2: 99%  Weight: 244 lb 6.4 oz (110.9 kg)  Height: 5\' 4"  (1.626 m)    Review of Systems  Constitutional: Positive for activity change and fatigue.  HENT: Negative for congestion, postnasal drip, rhinorrhea, sinus pressure, sinus pain, sneezing and sore throat.   Eyes: Negative.   Respiratory: Negative for cough, chest tightness, shortness of breath and wheezing.   Cardiovascular: Negative for chest pain and palpitations.  Gastrointestinal: Positive for nausea. Negative for constipation, diarrhea and vomiting.  Endocrine: Negative for cold intolerance, heat intolerance, polydipsia, polyphagia and polyuria.  Genitourinary: Positive for flank pain, frequency, hematuria and urgency.       Vaginal irritation with some vaginal discharge.   Musculoskeletal: Positive for arthralgias, back pain and gait problem.  Skin: Positive for rash.       Rash under the breast and in groin. Gets worse with sweating.  Also has area on the buttocks that keeps breaking out into small  blisters. Same area keeps breaking out. They will drain and scab over, then new, small blisters will occur. They itch and are tender.   Allergic/Immunologic: Positive for environmental allergies.  Neurological: Positive for weakness and headaches. Negative for seizures.  Hematological: Negative for adenopathy.  Psychiatric/Behavioral: Positive for dysphoric mood. The patient is nervous/anxious.     Physical Exam  Constitutional: She is oriented to person, place, and time. She appears well-developed and well-nourished. No distress.  HENT:  Head: Normocephalic and atraumatic.  Nose: Nose normal.  Mouth/Throat: Oropharynx is clear and moist. No oropharyngeal exudate.  Eyes: Pupils are equal, round, and reactive to light. Conjunctivae and EOM are normal.  Neck: Normal range of motion. Neck supple. No JVD present. Carotid bruit is not present. No tracheal deviation present. No thyromegaly  present.  Cardiovascular: Normal rate, regular rhythm and normal heart sounds. Exam reveals no gallop and no friction rub.  No murmur heard. Pulmonary/Chest: Effort normal and breath sounds normal. No respiratory distress. She has no wheezes. She has no rales.  Abdominal: Soft. Bowel sounds are normal. There is no tenderness.  Genitourinary:  Genitourinary Comments: Urine sample is positive for moderate WBC and blood. Also positive for protein.   Musculoskeletal:  Generalized joint pain with point tenderness present.   Lymphadenopathy:    She has no cervical adenopathy.  Neurological: She is alert and oriented to person, place, and time. No cranial nerve deficit.  Skin: Capillary refill takes less than 2 seconds. No rash noted. She is not diaphoretic. No erythema.  Psychiatric: Her speech is normal and behavior is normal. Judgment and thought content normal. Her mood appears anxious. Cognition and memory are normal. She exhibits a depressed mood.  Nursing note and vitals reviewed.  Assessment/Plan: 1.  Urinary tract infection with hematuria, site unspecified Start macrobid 100mg  twice daily for 10 days. Send urine for culture and sensitivity and adjust abx as indicated.  - nitrofurantoin, macrocrystal-monohydrate, (MACROBID) 100 MG capsule; Take 1 capsule (100 mg total) by mouth 2 (two) times daily.  Dispense: 20 capsule; Refill: 0  2. Dysuria Add pyridium 200mg  up to three times daily to reduce bladder pain and spasms.  - POCT Urinalysis Dipstick - CULTURE, URINE COMPREHENSIVE - phenazopyridine (PYRIDIUM) 200 MG tablet; Take 1 tablet (200 mg total) by mouth 3 (three) times daily as needed for pain.  Dispense: 10 tablet; Refill: 0  3. Vaginal candidiasis Diflucan should be used as needed and as prescribed for yeast infection.  - fluconazole (DIFLUCAN) 150 MG tablet; Take one tab po qd x3 days and the prn  Dispense: 5 tablet; Refill: 1  4. Episode of moderate major depression ([place]) Add wellbutrin SR 100mg  tablets daily. Reassess at next visit.  - buPROPion (WELLBUTRIN SR) 100 MG 12 hr tablet; Take 1 tablet (100 mg total) by mouth daily.  Dispense: 30 tablet; Refill: 3  General Counseling: Sakiya verbalizes understanding of the findings of todays visit and agrees with plan of treatment. I have discussed any further diagnostic evaluation that may be needed or ordered today. We also reviewed her medications today. she has been encouraged to call the office with any questions or concerns that should arise related to todays visit.    Counseling:  Rest and increase fluids. Continue using OTC medication to control symptoms.   This patient was seen by Leretha Pol FNP Collaboration with Dr Lavera Guise as a part of collaborative care agreement  Orders Placed This Encounter  Procedures  . CULTURE, URINE COMPREHENSIVE  . POCT Urinalysis Dipstick    Meds ordered this encounter  Medications  . nitrofurantoin, macrocrystal-monohydrate, (MACROBID) 100 MG capsule    Sig: Take 1 capsule (100  mg total) by mouth 2 (two) times daily.    Dispense:  20 capsule    Refill:  0    Order Specific Question:   Supervising Provider    Answer:   Lavera Guise [[id number]]  . phenazopyridine (PYRIDIUM) 200 MG tablet    Sig: Take 1 tablet (200 mg total) by mouth 3 (three) times daily as needed for pain.    Dispense:  10 tablet    Refill:  0    Order Specific Question:   Supervising Provider    Answer:   Lavera Guise [[id number]]  . buPROPion (WELLBUTRIN SR) 100  MG 12 hr tablet    Sig: Take 1 tablet (100 mg total) by mouth daily.    Dispense:  30 tablet    Refill:  3    Order Specific Question:   Supervising Provider    Answer:   Lavera Guise [[id number]]  . fluconazole (DIFLUCAN) 150 MG tablet    Sig: Take one tab po qd x3 days and the prn    Dispense:  5 tablet    Refill:  1    Order Specific Question:   Supervising Provider    Answer:   Lavera Guise [[id number]]    Time spent: 15 Minutes

## 2017-11-05 LAB — CULTURE, URINE COMPREHENSIVE

## 2017-11-08 ENCOUNTER — Ambulatory Visit: Payer: Self-pay | Admitting: Internal Medicine

## 2017-11-11 DIAGNOSIS — N390 Urinary tract infection, site not specified: Secondary | ICD-10-CM | POA: Insufficient documentation

## 2017-11-11 DIAGNOSIS — B373 Candidiasis of vulva and vagina: Secondary | ICD-10-CM | POA: Insufficient documentation

## 2017-11-11 DIAGNOSIS — R300 Dysuria: Secondary | ICD-10-CM | POA: Insufficient documentation

## 2017-11-11 DIAGNOSIS — B3731 Acute candidiasis of vulva and vagina: Secondary | ICD-10-CM | POA: Insufficient documentation

## 2017-11-11 DIAGNOSIS — F321 Major depressive disorder, single episode, moderate: Secondary | ICD-10-CM | POA: Insufficient documentation

## 2017-11-11 DIAGNOSIS — R319 Hematuria, unspecified: Secondary | ICD-10-CM

## 2017-11-16 ENCOUNTER — Other Ambulatory Visit: Payer: Self-pay

## 2017-11-16 ENCOUNTER — Other Ambulatory Visit: Payer: Self-pay | Admitting: Internal Medicine

## 2017-11-16 MED ORDER — DULOXETINE HCL 20 MG PO CPEP: 20.0000 mg | ORAL_CAPSULE | Freq: Every day | ORAL | 2 refills | Status: DC

## 2017-11-16 MED ORDER — OMEPRAZOLE 20 MG PO CPDR: 20.0000 mg | DELAYED_RELEASE_CAPSULE | Freq: Every day | ORAL | 2 refills | Status: DC

## 2017-12-10 ENCOUNTER — Encounter: Admit: 2017-12-10 | Discharge: 2017-12-11 | Payer: MEDICARE | Attending: Rheumatology | Primary: Rheumatology

## 2017-12-10 DIAGNOSIS — C9211 Chronic myeloid leukemia, BCR/ABL-positive, in remission: Secondary | ICD-10-CM | POA: Diagnosis not present

## 2017-12-10 DIAGNOSIS — I110 Hypertensive heart disease with heart failure: Secondary | ICD-10-CM | POA: Diagnosis not present

## 2017-12-10 DIAGNOSIS — E119 Type 2 diabetes mellitus without complications: Secondary | ICD-10-CM | POA: Diagnosis not present

## 2017-12-10 DIAGNOSIS — Z7951 Long term (current) use of inhaled steroids: Secondary | ICD-10-CM | POA: Diagnosis not present

## 2017-12-10 DIAGNOSIS — Z7952 Long term (current) use of systemic steroids: Secondary | ICD-10-CM | POA: Diagnosis not present

## 2017-12-10 DIAGNOSIS — Z882 Allergy status to sulfonamides status: Secondary | ICD-10-CM | POA: Diagnosis not present

## 2017-12-10 DIAGNOSIS — Z9225 Personal history of immunosupression therapy: Secondary | ICD-10-CM | POA: Diagnosis not present

## 2017-12-10 DIAGNOSIS — Z7409 Other reduced mobility: Secondary | ICD-10-CM | POA: Diagnosis not present

## 2017-12-10 DIAGNOSIS — M810 Age-related osteoporosis without current pathological fracture: Secondary | ICD-10-CM | POA: Diagnosis not present

## 2017-12-10 DIAGNOSIS — M20021 Boutonniere deformity of right finger(s): Secondary | ICD-10-CM | POA: Diagnosis not present

## 2017-12-10 DIAGNOSIS — M329 Systemic lupus erythematosus, unspecified: Secondary | ICD-10-CM | POA: Diagnosis not present

## 2017-12-10 DIAGNOSIS — I5032 Chronic diastolic (congestive) heart failure: Secondary | ICD-10-CM | POA: Diagnosis not present

## 2017-12-10 DIAGNOSIS — M170 Bilateral primary osteoarthritis of knee: Secondary | ICD-10-CM | POA: Diagnosis not present

## 2017-12-10 DIAGNOSIS — M059 Rheumatoid arthritis with rheumatoid factor, unspecified: Secondary | ICD-10-CM | POA: Diagnosis not present

## 2017-12-10 DIAGNOSIS — Z79899 Other long term (current) drug therapy: Secondary | ICD-10-CM | POA: Diagnosis not present

## 2017-12-10 DIAGNOSIS — Z8249 Family history of ischemic heart disease and other diseases of the circulatory system: Secondary | ICD-10-CM | POA: Diagnosis not present

## 2017-12-10 DIAGNOSIS — Z23 Encounter for immunization: Secondary | ICD-10-CM | POA: Diagnosis not present

## 2017-12-10 DIAGNOSIS — Z7982 Long term (current) use of aspirin: Secondary | ICD-10-CM | POA: Diagnosis not present

## 2017-12-10 DIAGNOSIS — M0570 Rheumatoid arthritis with rheumatoid factor of unspecified site without organ or systems involvement: Secondary | ICD-10-CM | POA: Diagnosis not present

## 2017-12-10 DIAGNOSIS — D689 Coagulation defect, unspecified: Secondary | ICD-10-CM | POA: Diagnosis not present

## 2017-12-10 DIAGNOSIS — M20022 Boutonniere deformity of left finger(s): Secondary | ICD-10-CM | POA: Diagnosis not present

## 2017-12-10 DIAGNOSIS — Z87891 Personal history of nicotine dependence: Secondary | ICD-10-CM | POA: Diagnosis not present

## 2017-12-10 DIAGNOSIS — Z79891 Long term (current) use of opiate analgesic: Secondary | ICD-10-CM | POA: Diagnosis not present

## 2017-12-10 MED ORDER — OXYCODONE 10 MG TABLET
ORAL_TABLET | Freq: Three times a day (TID) | ORAL | 0 refills | 0 days | Status: CP | PRN
Start: 2017-12-10 — End: 2018-01-13

## 2017-12-13 ENCOUNTER — Encounter: Payer: Self-pay | Admitting: Nurse Practitioner

## 2017-12-13 ENCOUNTER — Ambulatory Visit (INDEPENDENT_AMBULATORY_CARE_PROVIDER_SITE_OTHER): Payer: Medicare Other | Admitting: Nurse Practitioner

## 2017-12-13 VITALS — BP 153/77 | HR 92 | Resp 16 | Ht 64.0 in | Wt 244.2 lb

## 2017-12-13 DIAGNOSIS — E782 Mixed hyperlipidemia: Secondary | ICD-10-CM

## 2017-12-13 DIAGNOSIS — B3731 Acute candidiasis of vulva and vagina: Secondary | ICD-10-CM

## 2017-12-13 DIAGNOSIS — N390 Urinary tract infection, site not specified: Secondary | ICD-10-CM

## 2017-12-13 DIAGNOSIS — E1165 Type 2 diabetes mellitus with hyperglycemia: Secondary | ICD-10-CM

## 2017-12-13 DIAGNOSIS — F331 Major depressive disorder, recurrent, moderate: Secondary | ICD-10-CM

## 2017-12-13 DIAGNOSIS — R143 Flatulence: Secondary | ICD-10-CM

## 2017-12-13 DIAGNOSIS — B373 Candidiasis of vulva and vagina: Secondary | ICD-10-CM

## 2017-12-13 DIAGNOSIS — M0579 Rheumatoid arthritis with rheumatoid factor of multiple sites without organ or systems involvement: Secondary | ICD-10-CM

## 2017-12-13 DIAGNOSIS — R142 Eructation: Secondary | ICD-10-CM

## 2017-12-13 DIAGNOSIS — R141 Gas pain: Secondary | ICD-10-CM

## 2017-12-13 LAB — POCT GLYCOSYLATED HEMOGLOBIN (HGB A1C): Hemoglobin A1C: 6.7 % — AB (ref 4.0–5.6)

## 2017-12-13 MED ORDER — AMOXICILLIN-POT CLAVULANATE 875-125 MG PO TABS: 1.0000 | ORAL_TABLET | Freq: Two times a day (BID) | ORAL | 0 refills | Status: DC

## 2017-12-13 MED ORDER — FLUCONAZOLE 150 MG PO TABS: ORAL_TABLET | ORAL | 0 refills | Status: DC

## 2017-12-13 MED ORDER — TRAMADOL HCL 50 MG PO TABS: 100.0000 mg | ORAL_TABLET | Freq: Two times a day (BID) | ORAL | 3 refills | Status: DC

## 2017-12-13 MED ORDER — OMEPRAZOLE 20 MG PO CPDR: 20.0000 mg | DELAYED_RELEASE_CAPSULE | Freq: Every day | ORAL | 2 refills | Status: DC

## 2017-12-13 MED ORDER — ROSUVASTATIN CALCIUM 5 MG PO TABS: ORAL_TABLET | ORAL | 3 refills | Status: DC

## 2017-12-13 MED ORDER — DULOXETINE HCL 20 MG PO CPEP: 20.0000 mg | ORAL_CAPSULE | Freq: Every day | ORAL | 2 refills | Status: DC

## 2017-12-13 NOTE — Progress Notes (Signed)
[facility] [place], [place] [address]  Internal MEDICINE  Office Visit Note  Patient Name: Laura Chen  [id number]  [id number]  Date of Service: 12/16/2017  Chief Complaint  Patient presents with  . Medical Management of Chronic Issues    76month follow up. pt had blood work done on Monday at [facility] stated she has white blood cells in urine, kidneys are hurting.  . Diabetes  . Hypertension    The patient was seen on Monday per her rheumatologist. She had labs done as well as a u/a done. Her urine sample was cloudy and showed a large amount of WBC. She has not been notified of lab results but can see them on MyChart. She has right sided flank pain and states that she is not urinating as often as she normally does.  She is supposed to be scheduled for retuxin treatment for RA in the near future, but will be unable to do this if she has infection or if she is on antibiotics.       Current Medication: Outpatient Encounter Medications as of 12/13/2017  Medication Sig  . ACCU-CHEK FASTCLIX LANCETS MISC USE TO TEST BLOOD SUGAR QD UTD  . ACCU-CHEK SMARTVIEW test strip TEST UTD ONCE D  . aspirin EC 81 MG tablet Take 81 mg by mouth every evening.   Marland Kitchen azaTHIOprine (IMURAN) 50 MG tablet Take 50 mg by mouth daily.  . baclofen (LIORESAL) 10 MG tablet Take 10 mg by mouth 3 (three) times daily.  Marland Kitchen buPROPion (WELLBUTRIN SR) 100 MG 12 hr tablet Take 1 tablet (100 mg total) by mouth daily.  . cetirizine (ZYRTEC) 10 MG tablet Take 10 mg by mouth.  . chlorthalidone (HYGROTON) 25 MG tablet Take 1 tablet (25 mg total) by mouth daily.  . cholecalciferol (VITAMIN D) 1000 units tablet Take 2,000 Units by mouth daily.  . DULoxetine (CYMBALTA) 20 MG capsule Take 1 capsule (20 mg total) by mouth daily.  Marland Kitchen ethacrynic acid (EDECRIN) 25 MG tablet Take 1 tablet by mouth daily.  . fluticasone (FLONASE) 50 MCG/ACT nasal spray USE 1 SPRAY IN BOTH  NOSTRILS DAILY.  Marland Kitchen  guaiFENesin-dextromethorphan (ROBITUSSIN DM) 100-10 MG/5ML syrup Take 5 mLs by mouth every 4 (four) hours as needed for cough.  . linaclotide (LINZESS) 290 MCG CAPS capsule Take 1 capsule (290 mcg total) by mouth daily.  Marland Kitchen omeprazole (PRILOSEC) 20 MG capsule Take 1 capsule (20 mg total) by mouth daily.  . Oxycodone HCl 10 MG TABS Take 10 mg by mouth 3 (three) times daily as needed (for pain).  . phenazopyridine (PYRIDIUM) 200 MG tablet Take 1 tablet (200 mg total) by mouth 3 (three) times daily as needed for pain.  . ponatinib HCl (ICLUSIG) 15 MG tablet Take 15 mg by mouth daily.   . potassium chloride SA (K-DUR,KLOR-CON) 20 MEQ tablet Take 1 tablet (20 mEq total) by mouth 2 (two) times daily.  . predniSONE (DELTASONE) 5 MG tablet Take 5 mg by mouth daily with breakfast.  . rosuvastatin (CRESTOR) 5 MG tablet TAKE 1 TABLET BY MOUTH  DAILY WITH SUPPER  . traMADol (ULTRAM) 50 MG tablet Take 2 tablets (100 mg total) by mouth 2 (two) times daily.  . [DISCONTINUED] DULoxetine (CYMBALTA) 20 MG capsule Take 1 capsule (20 mg total) by mouth daily.  . [DISCONTINUED] fluconazole (DIFLUCAN) 150 MG tablet Take one tab po qd x3 days and the prn  . [DISCONTINUED] omeprazole (PRILOSEC) 20 MG capsule Take 1 capsule (20  mg total) by mouth daily.  . [DISCONTINUED] rosuvastatin (CRESTOR) 5 MG tablet TAKE 1 TABLET BY MOUTH  DAILY WITH SUPPER  . [DISCONTINUED] traMADol (ULTRAM) 50 MG tablet Take 2 tablets (100 mg total) by mouth 2 (two) times daily.  Marland Kitchen amoxicillin-clavulanate (AUGMENTIN) 875-125 MG tablet Take 1 tablet by mouth 2 (two) times daily.  . fluconazole (DIFLUCAN) 150 MG tablet Take 1 tablet po once. May repeat dose in 3 days as needed for persistent symptoms.  . nitrofurantoin, macrocrystal-monohydrate, (MACROBID) 100 MG capsule Take 1 capsule (100 mg total) by mouth 2 (two) times daily. (Patient not taking: Reported on 12/13/2017)   No facility-administered encounter medications on file as of 12/13/2017.      Surgical History: Past Surgical History:  Procedure Laterality Date  . DEXA  2010   osteopenia  . MYOMECTOMY  2008  . right ankle AFO  2011  . TRANSTHORACIC ECHOCARDIOGRAM  02/6604   nl systolic/diastolic fxn, EF 30%, mild MR, normal PA pressures  . US/HSG  05/2010   possible endometrial polyp, rec rpt 8 wks continue loestrin [facility])    Medical History: Past Medical History:  Diagnosis Date  . Asthma   . CML (chronic myeloid leukemia) ([place]) 2011   (Dr. Alyse Low) stopped gleevec 2/2 side effects  . Diabetes mellitus without complication ([place])   . GERD (gastroesophageal reflux disease)   . History of shingles   . History of syphilis 1990   s/p treatment  . Hypertension   . Leukemia ([place])   . Osteopenia    due to prednisone use, was on reclast  . PUD (peptic ulcer disease)   . Rheumatoid arthritis(714.0) 1990s   Jacoud's arthropathy, previously treated with MTX, TNFa (remicade, enbrel, humira, orencia, rituximab, and actemra)  . SLE (systemic lupus erythematosus) ([place]) 09/2009   Rheum-Dr. Joan Mayans  . Thyroid goiter    Endo-Dr. Eddie Dibbles    Family History: Family History  Problem Relation Age of Onset  . Heart attack Maternal Grandmother   . Coronary artery disease Maternal Grandmother   . Hypertension Mother   . Hypertension Unknown        Aunt    Social History   Socioeconomic History  . Marital status: Single    Spouse name: Not on file  . Number of children: Not on file  . Years of education: Not on file  . Highest education level: Not on file  Occupational History  . Occupation: Secretary/administrator at [employer]  . Financial resource strain: Not on file  . Food insecurity:    Worry: Not on file    Inability: Not on file  . Transportation needs:    Medical: Not on file    Non-medical: Not on file  Tobacco Use  . Smoking status: Former Smoker    Packs/day: 0.30    Types: Cigarettes  . Smokeless tobacco: Never Used  . Tobacco  comment: 6 cigarettes/day  Substance and Sexual Activity  . Alcohol use: No  . Drug use: No  . Sexual activity: Not on file  Lifestyle  . Physical activity:    Days per week: Not on file    Minutes per session: Not on file  . Stress: Not on file  Relationships  . Social connections:    Talks on phone: Not on file    Gets together: Not on file    Attends religious service: Not on file    Active member of club or organization: Not on file  Attends meetings of clubs or organizations: Not on file    Relationship status: Not on file  . Intimate partner violence:    Fear of current or ex partner: Not on file    Emotionally abused: Not on file    Physically abused: Not on file    Forced sexual activity: Not on file  Other Topics Concern  . Not on file  Social History Narrative   Caffeine: 1 cup/day coffee      Lives with mother, 1 dog      HS education      Review of Systems  Constitutional: Positive for activity change and fatigue.  HENT: Negative for congestion, postnasal drip, rhinorrhea, sinus pressure, sinus pain, sneezing and sore throat.   Eyes: Negative.   Respiratory: Negative for cough, chest tightness, shortness of breath and wheezing.   Cardiovascular: Negative for chest pain and palpitations.  Gastrointestinal: Positive for nausea. Negative for constipation, diarrhea and vomiting.  Endocrine: Negative for cold intolerance, heat intolerance, polydipsia, polyphagia and polyuria.  Genitourinary: Positive for flank pain, frequency, hematuria and urgency.       Vaginal irritation with some vaginal discharge.   Musculoskeletal: Positive for arthralgias, back pain and gait problem.       Generalized joint pain, worse than usual.   Skin: Positive for rash.       Rash under the breast and in groin. Gets worse with sweating.  Also has area on the buttocks that keeps breaking out into small blisters. Same area keeps breaking out. They will drain and scab over, then new,  small blisters will occur. They itch and are tender.   Allergic/Immunologic: Positive for environmental allergies.  Neurological: Positive for weakness and headaches. Negative for seizures.  Hematological: Negative for adenopathy.  Psychiatric/Behavioral: Positive for dysphoric mood. The patient is nervous/anxious.     Today's Vitals   12/13/17 1118  BP: (!) 153/77  Pulse: 92  Resp: 16  SpO2: 98%  Weight: 244 lb 3.2 oz (110.8 kg)  Height: 5\' 4"  (1.626 m)    Physical Exam  Constitutional: She is oriented to person, place, and time. She appears well-developed and well-nourished. No distress.  HENT:  Head: Normocephalic and atraumatic.  Nose: Nose normal.  Mouth/Throat: Oropharynx is clear and moist. No oropharyngeal exudate.  Eyes: Pupils are equal, round, and reactive to light. Conjunctivae and EOM are normal.  Neck: Normal range of motion. Neck supple. No JVD present. Carotid bruit is not present. No tracheal deviation present. No thyromegaly present.  Cardiovascular: Normal rate, regular rhythm and normal heart sounds. Exam reveals no gallop and no friction rub.  No murmur heard. Pulmonary/Chest: Effort normal and breath sounds normal. No respiratory distress. She has no wheezes. She has no rales.  Abdominal: Soft. Bowel sounds are normal. There is no tenderness.  Genitourinary:  Genitourinary Comments: Urine sample taken at rheumatology office on 12/10/2017 was positive for large WBC and small blood.   Musculoskeletal:  Generalized joint pain with point tenderness present.   Lymphadenopathy:    She has no cervical adenopathy.  Neurological: She is alert and oriented to person, place, and time. No cranial nerve deficit.  Skin: Capillary refill takes less than 2 seconds. No rash noted. She is not diaphoretic. No erythema.  Psychiatric: Her speech is normal and behavior is normal. Judgment and thought content normal. Her mood appears anxious. Cognition and memory are normal. She  exhibits a depressed mood.  Nursing note and vitals reviewed.  Assessment/Plan: 1. Uncontrolled type  2 diabetes mellitus with hyperglycemia (HCC) - POCT HgB A1C 6.7 today. Continues to be controlled through diet. Will monitor closely.  2. Urinary tract infection without hematuria, site unspecified Positive for UTI at RA visit 12/10/2017. Start augmentin 875mg  BID for 10 days. Will monitor.  - amoxicillin-clavulanate (AUGMENTIN) 875-125 MG tablet; Take 1 tablet by mouth 2 (two) times daily.  Dispense: 20 tablet; Refill: 0  3. Mixed hyperlipidemia - rosuvastatin (CRESTOR) 5 MG tablet; TAKE 1 TABLET BY MOUTH  DAILY WITH SUPPER  Dispense: 90 tablet; Refill: 3  4. FLATULENCE ERUCTATION AND GAS PAIN - omeprazole (PRILOSEC) 20 MG capsule; Take 1 capsule (20 mg total) by mouth daily.  Dispense: 90 capsule; Refill: 2  5. Depression, major, recurrent, moderate (HCC) - DULoxetine (CYMBALTA) 20 MG capsule; Take 1 capsule (20 mg total) by mouth daily.  Dispense: 90 capsule; Refill: 2  6. Vaginal candidiasis - fluconazole (DIFLUCAN) 150 MG tablet; Take 1 tablet po once. May repeat dose in 3 days as needed for persistent symptoms.  Dispense: 3 tablet; Refill: 0  7. Rheumatoid arthritis involving multiple sites with positive rheumatoid factor ([place]) May continue to take tramadol 100mg  up to twice daily when needed for pain. Continue regular visits with rheumatology as scheduled.  - traMADol (ULTRAM) 50 MG tablet; Take 2 tablets (100 mg total) by mouth 2 (two) times daily.  Dispense: 120 tablet; Refill: 3  General Counseling: Laura Chen verbalizes understanding of the findings of todays visit and agrees with plan of treatment. I have discussed any further diagnostic evaluation that may be needed or ordered today. We also reviewed her medications today. she has been encouraged to call the office with any questions or concerns that should arise related to todays visit.  Reviewed risks and possible side effects  associated with taking opiates, benzodiazepines and other CNS depressants. Combination of these could cause dizziness and drowsiness. Advised patient not to drive or operate machinery when taking these medications, as patient's and other's life can be at risk and will have consequences. Patient verbalized understanding in this matter. Dependence and abuse for these drugs will be monitored closely. A Controlled substance policy and procedure is on file which allows [facility] to order a urine drug screen test at any visit. Patient understands and agrees with the plan  This patient was seen by Leretha Pol FNP Collaboration with Dr Lavera Guise as a part of collaborative care agreement  Orders Placed This Encounter  Procedures  . POCT HgB A1C    Meds ordered this encounter  Medications  . amoxicillin-clavulanate (AUGMENTIN) 875-125 MG tablet    Sig: Take 1 tablet by mouth 2 (two) times daily.    Dispense:  20 tablet    Refill:  0    Order Specific Question:   Supervising Provider    Answer:   Lavera Guise [[id number]]  . DULoxetine (CYMBALTA) 20 MG capsule    Sig: Take 1 capsule (20 mg total) by mouth daily.    Dispense:  90 capsule    Refill:  2    Order Specific Question:   Supervising Provider    Answer:   Lavera Guise [[id number]]  . omeprazole (PRILOSEC) 20 MG capsule    Sig: Take 1 capsule (20 mg total) by mouth daily.    Dispense:  90 capsule    Refill:  2    Order Specific Question:   Supervising Provider    Answer:   Lavera Guise [[id number]]  . rosuvastatin (CRESTOR)  5 MG tablet    Sig: TAKE 1 TABLET BY MOUTH  DAILY WITH SUPPER    Dispense:  90 tablet    Refill:  3    Order Specific Question:   Supervising Provider    Answer:   Lavera Guise [place]  . fluconazole (DIFLUCAN) 150 MG tablet    Sig: Take 1 tablet po once. May repeat dose in 3 days as needed for persistent symptoms.    Dispense:  3 tablet    Refill:  0    Order Specific Question:   Supervising Provider     Answer:   Lavera Guise [[id number]]  . traMADol (ULTRAM) 50 MG tablet    Sig: Take 2 tablets (100 mg total) by mouth 2 (two) times daily.    Dispense:  120 tablet    Refill:  3    Written rx provided 12/13/2017    Order Specific Question:   Supervising Provider    Answer:   Lavera Guise [[id number]]    Time spent: 20 Minutes      Dr Lavera Guise Internal medicine

## 2017-12-16 DIAGNOSIS — E1165 Type 2 diabetes mellitus with hyperglycemia: Secondary | ICD-10-CM | POA: Insufficient documentation

## 2017-12-16 DIAGNOSIS — F331 Major depressive disorder, recurrent, moderate: Secondary | ICD-10-CM | POA: Insufficient documentation

## 2017-12-16 DIAGNOSIS — E782 Mixed hyperlipidemia: Secondary | ICD-10-CM | POA: Insufficient documentation

## 2017-12-16 DIAGNOSIS — E119 Type 2 diabetes mellitus without complications: Secondary | ICD-10-CM | POA: Insufficient documentation

## 2017-12-28 ENCOUNTER — Other Ambulatory Visit: Payer: Self-pay

## 2017-12-28 DIAGNOSIS — E1165 Type 2 diabetes mellitus with hyperglycemia: Secondary | ICD-10-CM

## 2017-12-28 MED ORDER — GLUCOSE BLOOD VI STRP: ORAL_STRIP | 1 refills | Status: DC

## 2017-12-28 NOTE — Telephone Encounter (Signed)
Pt called stating that she received the accu check guide meter from the office and she had accu check nano previous to this machine, she tried using the nano strips for the guide machine and they will not fit, informed the pt that I will send over rx for the guide strips for her machine. Pt stated she uses [facility]

## 2018-01-04 ENCOUNTER — Other Ambulatory Visit: Payer: Self-pay | Admitting: Nurse Practitioner

## 2018-01-04 ENCOUNTER — Telehealth: Payer: Self-pay | Admitting: Nurse Practitioner

## 2018-01-04 DIAGNOSIS — B370 Candidal stomatitis: Secondary | ICD-10-CM

## 2018-01-04 DIAGNOSIS — E1165 Type 2 diabetes mellitus with hyperglycemia: Secondary | ICD-10-CM

## 2018-01-04 MED ORDER — NYSTATIN 100000 UNIT/ML MT SUSP: 5.0000 mL | Freq: Four times a day (QID) | OROMUCOSAL | 1 refills | Status: DC

## 2018-01-04 MED ORDER — GLUCOSE BLOOD VI STRP: ORAL_STRIP | 1 refills | Status: DC

## 2018-01-04 NOTE — Progress Notes (Signed)
Patient called c/o thrush. Sent nystatin oral rinse. Swish and spit four times daily for next 7 to 10 days.

## 2018-01-04 NOTE — Telephone Encounter (Signed)
Patient notified of rx at pharmacy

## 2018-01-04 NOTE — Telephone Encounter (Signed)
Patient called c/o thrush. Sent nystatin oral rinse. Swish and spit four times daily for next 7 to 10 days.

## 2018-01-04 NOTE — Telephone Encounter (Signed)
Pt called and states that she has oral thrush, can she have something for that called in

## 2018-01-13 MED ORDER — OXYCODONE 10 MG TABLET
ORAL_TABLET | Freq: Three times a day (TID) | ORAL | 0 refills | 0 days | Status: CP | PRN
Start: 2018-01-13 — End: 2018-02-15

## 2018-01-20 NOTE — Progress Notes (Signed)
[facility]  [phone] [phone]  ID: Laura Chen OB: Jun 03, 1965  MR#: [id number]  RJJ#:[id number]  Patient Care Team: Lavera Guise, MD as PCP - General (Internal Medicine) Lavera Guise, MD (Internal Medicine) Christene Lye, MD (General Surgery)  CHIEF COMPLAINT: CML  INTERVAL HISTORY: Patient returns to clinic today for routine 81-month follow-up.  She continues to tolerate Iclusig well without significant side effects.  She has no neurologic complaints. She denies any fevers, night sweats, or weight loss.  She has chronic pain from her arthritis. She denies any chest pain, cough, hemoptysis, or shortness of breath. She has a fair appetite. She has no nausea, vomiting, constipation, or diarrhea.  She has no urinary complaints.  Patient feels at her baseline and offers no further specific complaints today.  REVIEW OF SYSTEMS:   Review of Systems  Constitutional: Negative.  Negative for fever, malaise/fatigue and weight loss.  Respiratory: Negative.  Negative for cough and shortness of breath.   Cardiovascular: Negative.  Negative for chest pain and leg swelling.  Gastrointestinal: Negative.  Negative for abdominal pain and constipation.  Genitourinary: Negative.   Musculoskeletal: Positive for back pain, joint pain and neck pain.  Skin: Negative.  Negative for rash.  Neurological: Negative.  Negative for sensory change, focal weakness, weakness and headaches.  Psychiatric/Behavioral: Negative.  The patient is not nervous/anxious.     As per HPI. Otherwise, a complete review of systems is negative.  PAST MEDICAL HISTORY: Past Medical History:  Diagnosis Date  . Asthma   . CML (chronic myeloid leukemia) ([place]) 2011   (Dr. Alyse Low) stopped gleevec 2/2 side effects  . Diabetes mellitus without complication ([place])   . GERD (gastroesophageal reflux disease)   . History of shingles   . History of syphilis 1990   s/p  treatment  . Hypertension   . Leukemia ([place])   . Osteopenia    due to prednisone use, was on reclast  . PUD (peptic ulcer disease)   . Rheumatoid arthritis(714.0) 1990s   Jacoud's arthropathy, previously treated with MTX, TNFa (remicade, enbrel, humira, orencia, rituximab, and actemra)  . SLE (systemic lupus erythematosus) ([place]) 09/2009   Rheum-Dr. Joan Mayans  . Thyroid goiter    Endo-Dr. Eddie Dibbles    PAST SURGICAL HISTORY: Past Surgical History:  Procedure Laterality Date  . DEXA  2010   osteopenia  . MYOMECTOMY  2008  . right ankle AFO  2011  . TRANSTHORACIC ECHOCARDIOGRAM  0/1601   nl systolic/diastolic fxn, EF 09%, mild MR, normal PA pressures  . US/HSG  05/2010   possible endometrial polyp, rec rpt 8 wks continue loestrin 24 ([place])    FAMILY HISTORY Family History  Problem Relation Age of Onset  . Heart attack Maternal Grandmother   . Coronary artery disease Maternal Grandmother   . Hypertension Mother   . Hypertension Unknown        Aunt       ADVANCED DIRECTIVES:    HEALTH MAINTENANCE: Social History   Tobacco Use  . Smoking status: Former Smoker    Packs/day: 0.30    Types: Cigarettes  . Smokeless tobacco: Never Used  . Tobacco comment: 6 cigarettes/day  Substance Use Topics  . Alcohol use: No  . Drug use: No    Allergies  Allergen Reactions  . Furosemide     rash  . Ibuprofen Swelling  . Sulfa Antibiotics Rash and Other (See Comments)    Reaction:  Burning of feet   .  Tape Itching and Rash  . Thiazide-Type Diuretics Rash    Current Outpatient Medications  Medication Sig Dispense Refill  . ACCU-CHEK FASTCLIX LANCETS MISC USE TO TEST BLOOD SUGAR QD UTD 90 each 3  . aspirin EC 81 MG tablet Take 81 mg by mouth every evening.     Marland Kitchen azaTHIOprine (IMURAN) 50 MG tablet Take 50 mg by mouth daily.    . baclofen (LIORESAL) 10 MG tablet Take 10 mg by mouth 3 (three) times daily.    Marland Kitchen buPROPion (WELLBUTRIN SR) 100 MG 12 hr tablet Take 1 tablet (100  mg total) by mouth daily. 30 tablet 3  . cephALEXin (KEFLEX) 500 MG capsule Take 1 capsule (500 mg total) by mouth 3 (three) times daily. 30 capsule 0  . cetirizine (ZYRTEC) 10 MG tablet Take 10 mg by mouth.    . cholecalciferol (VITAMIN D) 1000 units tablet Take 2,000 Units by mouth daily.    . DULoxetine (CYMBALTA) 20 MG capsule Take 1 capsule (20 mg total) by mouth daily. 90 capsule 3  . fluconazole (DIFLUCAN) 150 MG tablet Take 1 tablet po once. May repeat dose in 3 days as needed for persistent symptoms. 3 tablet 0  . fluticasone (FLONASE) 50 MCG/ACT nasal spray USE 1 SPRAY IN BOTH  NOSTRILS DAILY. 32 g 11  . glucose blood (ACCU-CHEK GUIDE) test strip Use to test blood sugar QD UTD, normal dx e11.65 100 each 1  . linaclotide (LINZESS) 290 MCG CAPS capsule Take 1 capsule (290 mcg total) by mouth daily. 90 capsule 2  . metroNIDAZOLE (METROGEL) 0.75 % vaginal gel Place 1 Applicatorful vaginally 2 (two) times daily. 70 g 0  . nitrofurantoin, macrocrystal-monohydrate, (MACROBID) 100 MG capsule Take 1 capsule (100 mg total) by mouth 2 (two) times daily. 20 capsule 0  . nystatin (MYCOSTATIN) 100000 UNIT/ML suspension Take 5 mLs (500,000 Units total) by mouth 4 (four) times daily. 200 mL 1  . omeprazole (PRILOSEC) 20 MG capsule Take 1 capsule (20 mg total) by mouth daily. 90 capsule 3  . Oxycodone HCl 10 MG TABS Take 10 mg by mouth 3 (three) times daily as needed (for pain).    . phenazopyridine (PYRIDIUM) 200 MG tablet Take 1 tablet (200 mg total) by mouth 3 (three) times daily as needed for pain. 10 tablet 0  . ponatinib HCl (ICLUSIG) 15 MG tablet Take 15 mg by mouth daily.     . potassium chloride SA (K-DUR,KLOR-CON) 20 MEQ tablet Take 1 tablet (20 mEq total) by mouth 2 (two) times daily. 450 tablet 2  . predniSONE (DELTASONE) 5 MG tablet Take 5 mg by mouth daily with breakfast.    . rosuvastatin (CRESTOR) 5 MG tablet TAKE 1 TABLET BY MOUTH  DAILY WITH SUPPER 90 tablet 3  . traMADol (ULTRAM) 50 MG  tablet Take 2 tablets (100 mg total) by mouth 2 (two) times daily. 120 tablet 3   No current facility-administered medications for this visit.     OBJECTIVE: Vitals:   01/22/18 1504  BP: 130/78  Pulse: 91  Temp: (!) 96.6 F (35.9 C)     Body mass index is 41.9 kg/m.    ECOG FS:1 - Symptomatic but completely ambulatory  General: Well-developed, well-nourished, no acute distress. Eyes: Pink conjunctiva, anicteric sclera. HEENT: Normocephalic, moist mucous membranes, clear oropharnyx. Lungs: Clear to auscultation bilaterally. Heart: Regular rate and rhythm. No rubs, murmurs, or gallops. Abdomen: Soft, nontender, nondistended. No organomegaly noted, normoactive bowel sounds. Musculoskeletal: No edema, cyanosis, or clubbing. Neuro:  Alert, answering all questions appropriately. Cranial nerves grossly intact. Skin: No rashes or petechiae noted. Psych: Normal affect. Lymphatics: No cervical, calvicular, axillary or inguinal LAD.  LAB RESULTS:  Lab Results  Component Value Date   NA 140 01/22/2018   K 4.0 01/22/2018   CL 108 01/22/2018   CO2 24 01/22/2018   GLUCOSE 145 (H) 01/22/2018   BUN 11 01/22/2018   CREATININE 1.30 (H) 01/22/2018   CALCIUM 8.5 (L) 01/22/2018   PROT 6.5 01/22/2018   ALBUMIN 3.7 01/22/2018   AST 46 (H) 01/22/2018   ALT 76 (H) 01/22/2018   ALKPHOS 199 (H) 01/22/2018   BILITOT 0.3 01/22/2018   GFRNONAA 48 (L) 01/22/2018   GFRAA 55 (L) 01/22/2018    Lab Results  Component Value Date   WBC 9.2 01/22/2018   NEUTROABS 6.0 01/22/2018   HGB 12.5 01/22/2018   HCT 42.7 01/22/2018   MCV 76.7 (L) 01/22/2018   PLT 265 01/22/2018     STUDIES: No results found.  ASSESSMENT: CML  PLAN:    1.  CML:  Previously, patient could not tolerate Gleevec, Sprycel, Tasigna, and Bosulif.  Currently she is taking 15 mg ponatinib (Iclusig) and tolerating it well.  Her most recent laboratory work [facility] reported continued molecular remission.  No intervention is needed at  this time.  Continue follow-up with [facility] as scheduled.  Return to clinic in 6 months for repeat laboratory work and further evaluation.  2.  Rheumatoid arthritis: Chronic and unchanged.  Continue 2 infusions of Rituxan every 6 months per [facility] rheumatology. 3.  Hypertension: Patient's blood pressure is within normal limits today.  Continue current treatment regimen. 4.  History of cavitary lung lesions: Rheumatologic nodules, treatment as above.  I spent a total of 30 minutes face-to-face with the patient of which greater than 50% of the visit was spent in counseling and coordination of care as detailed above.   Patient expressed understanding and was in agreement with this plan. She also understands that She can call clinic at any time with any questions, concerns, or complaints.     Lloyd Huger, MD   01/25/2018 12:20 PM

## 2018-01-21 ENCOUNTER — Encounter: Payer: Self-pay | Admitting: Nurse Practitioner

## 2018-01-21 ENCOUNTER — Ambulatory Visit (INDEPENDENT_AMBULATORY_CARE_PROVIDER_SITE_OTHER): Payer: Medicare Other | Admitting: Nurse Practitioner

## 2018-01-21 VITALS — BP 146/70 | HR 97 | Resp 16 | Ht 64.0 in | Wt 246.6 lb

## 2018-01-21 DIAGNOSIS — N760 Acute vaginitis: Secondary | ICD-10-CM

## 2018-01-21 DIAGNOSIS — N390 Urinary tract infection, site not specified: Secondary | ICD-10-CM

## 2018-01-21 DIAGNOSIS — R319 Hematuria, unspecified: Secondary | ICD-10-CM | POA: Diagnosis not present

## 2018-01-21 DIAGNOSIS — M0579 Rheumatoid arthritis with rheumatoid factor of multiple sites without organ or systems involvement: Secondary | ICD-10-CM | POA: Diagnosis not present

## 2018-01-21 DIAGNOSIS — B373 Candidiasis of vulva and vagina: Secondary | ICD-10-CM

## 2018-01-21 DIAGNOSIS — R143 Flatulence: Secondary | ICD-10-CM

## 2018-01-21 DIAGNOSIS — B370 Candidal stomatitis: Secondary | ICD-10-CM

## 2018-01-21 DIAGNOSIS — F331 Major depressive disorder, recurrent, moderate: Secondary | ICD-10-CM

## 2018-01-21 DIAGNOSIS — R141 Gas pain: Secondary | ICD-10-CM

## 2018-01-21 DIAGNOSIS — R300 Dysuria: Secondary | ICD-10-CM | POA: Diagnosis not present

## 2018-01-21 DIAGNOSIS — R142 Eructation: Secondary | ICD-10-CM

## 2018-01-21 DIAGNOSIS — B9689 Other specified bacterial agents as the cause of diseases classified elsewhere: Secondary | ICD-10-CM

## 2018-01-21 DIAGNOSIS — B3731 Acute candidiasis of vulva and vagina: Secondary | ICD-10-CM

## 2018-01-21 LAB — POCT URINALYSIS DIPSTICK
Bilirubin, UA: NEGATIVE
Glucose, UA: NEGATIVE
Ketones, UA: NEGATIVE
Nitrite, UA: POSITIVE
Protein, UA: POSITIVE — AB
Spec Grav, UA: 1.02 (ref 1.010–1.025)
Urobilinogen, UA: 1 E.U./dL
pH, UA: 6 (ref 5.0–8.0)

## 2018-01-21 MED ORDER — CEPHALEXIN 500 MG PO CAPS: 500.0000 mg | ORAL_CAPSULE | Freq: Three times a day (TID) | ORAL | 0 refills | Status: DC

## 2018-01-21 MED ORDER — METRONIDAZOLE 0.75 % VA GEL: 1.0000 | Freq: Two times a day (BID) | VAGINAL | 0 refills | Status: DC

## 2018-01-21 MED ORDER — DULOXETINE HCL 20 MG PO CPEP: 20.0000 mg | ORAL_CAPSULE | Freq: Every day | ORAL | 3 refills | Status: DC

## 2018-01-21 MED ORDER — TRAMADOL HCL 50 MG PO TABS: 100.0000 mg | ORAL_TABLET | Freq: Two times a day (BID) | ORAL | 3 refills | Status: DC

## 2018-01-21 MED ORDER — OMEPRAZOLE 20 MG PO CPDR: 20.0000 mg | DELAYED_RELEASE_CAPSULE | Freq: Every day | ORAL | 3 refills | Status: DC

## 2018-01-21 NOTE — Progress Notes (Signed)
[facility], [place] [address]  Internal MEDICINE  Office Visit Note  Patient Name: Laura Chen  [id number]  [id number]  Date of Service: 01/21/2018  Chief Complaint  Patient presents with  . Rash    pt is having discharge yellowish color, no itching or odor, the area is irritated. thinks she may have a bladder and yeast infection    Patient arrived for acute visit due to vaginal discharge. Patient stated the first symptoms appeared about 5 months ago. She has been seen here in October for the same problem and was treated with Augmentin and Diflucan for UTI. Patient stated that symptoms never resolved. Patient reports thick yellow vaginal discharge without odor or itching. Patient states that it is very irritating but denies any swelling or rash. Patient denies any pain, frequency, urgency with urination. Patient reports chronic constipation, and denies the presence of blood in urine or stool. She also reports right low lateral abdominal pain 2/10 intermittently. Patient reports CVA tenderness 2/10 for the past six months. Patient does not take anything specific to relieve pain as it feels very mild but anxious about possibility of UTI or kidney stone.       Current Medication: Outpatient Encounter Medications as of 01/21/2018  Medication Sig  . ACCU-CHEK FASTCLIX LANCETS MISC USE TO TEST BLOOD SUGAR QD UTD  . aspirin EC 81 MG tablet Take 81 mg by mouth every evening.   Marland Kitchen azaTHIOprine (IMURAN) 50 MG tablet Take 50 mg by mouth daily.  . baclofen (LIORESAL) 10 MG tablet Take 10 mg by mouth 3 (three) times daily.  Marland Kitchen buPROPion (WELLBUTRIN SR) 100 MG 12 hr tablet Take 1 tablet (100 mg total) by mouth daily.  . cetirizine (ZYRTEC) 10 MG tablet Take 10 mg by mouth.  . chlorthalidone (HYGROTON) 25 MG tablet Take 1 tablet (25 mg total) by mouth daily.  . cholecalciferol (VITAMIN D) 1000 units tablet Take 2,000 Units by mouth daily.  . DULoxetine (CYMBALTA)  20 MG capsule Take 1 capsule (20 mg total) by mouth daily.  Marland Kitchen ethacrynic acid (EDECRIN) 25 MG tablet Take 1 tablet by mouth daily.  . fluconazole (DIFLUCAN) 150 MG tablet Take 1 tablet po once. May repeat dose in 3 days as needed for persistent symptoms.  . fluticasone (FLONASE) 50 MCG/ACT nasal spray USE 1 SPRAY IN BOTH  NOSTRILS DAILY.  Marland Kitchen glucose blood (ACCU-CHEK GUIDE) test strip Use to test blood sugar QD UTD, normal dx e11.65  . guaiFENesin-dextromethorphan (ROBITUSSIN DM) 100-10 MG/5ML syrup Take 5 mLs by mouth every 4 (four) hours as needed for cough.  . linaclotide (LINZESS) 290 MCG CAPS capsule Take 1 capsule (290 mcg total) by mouth daily.  . nitrofurantoin, macrocrystal-monohydrate, (MACROBID) 100 MG capsule Take 1 capsule (100 mg total) by mouth 2 (two) times daily.  Marland Kitchen nystatin (MYCOSTATIN) 100000 UNIT/ML suspension Take 5 mLs (500,000 Units total) by mouth 4 (four) times daily.  Marland Kitchen omeprazole (PRILOSEC) 20 MG capsule Take 1 capsule (20 mg total) by mouth daily.  . Oxycodone HCl 10 MG TABS Take 10 mg by mouth 3 (three) times daily as needed (for pain).  . phenazopyridine (PYRIDIUM) 200 MG tablet Take 1 tablet (200 mg total) by mouth 3 (three) times daily as needed for pain.  . ponatinib HCl (ICLUSIG) 15 MG tablet Take 15 mg by mouth daily.   . potassium chloride SA (K-DUR,KLOR-CON) 20 MEQ tablet Take 1 tablet (20 mEq total) by mouth 2 (two) times daily.  Marland Kitchen  predniSONE (DELTASONE) 5 MG tablet Take 5 mg by mouth daily with breakfast.  . rosuvastatin (CRESTOR) 5 MG tablet TAKE 1 TABLET BY MOUTH  DAILY WITH SUPPER  . traMADol (ULTRAM) 50 MG tablet Take 2 tablets (100 mg total) by mouth 2 (two) times daily.  . [DISCONTINUED] DULoxetine (CYMBALTA) 20 MG capsule Take 1 capsule (20 mg total) by mouth daily.  . [DISCONTINUED] omeprazole (PRILOSEC) 20 MG capsule Take 1 capsule (20 mg total) by mouth daily.  . [DISCONTINUED] traMADol (ULTRAM) 50 MG tablet Take 2 tablets (100 mg total) by mouth 2  (two) times daily.  Marland Kitchen amoxicillin-clavulanate (AUGMENTIN) 875-125 MG tablet Take 1 tablet by mouth 2 (two) times daily. (Patient not taking: Reported on 01/21/2018)  . cephALEXin (KEFLEX) 500 MG capsule Take 1 capsule (500 mg total) by mouth 3 (three) times daily.  . metroNIDAZOLE (METROGEL) 0.75 % vaginal gel Place 1 Applicatorful vaginally 2 (two) times daily.   No facility-administered encounter medications on file as of 01/21/2018.     Surgical History: Past Surgical History:  Procedure Laterality Date  . DEXA  2010   osteopenia  . MYOMECTOMY  2008  . right ankle AFO  2011  . TRANSTHORACIC ECHOCARDIOGRAM  08/6193   nl systolic/diastolic fxn, EF 09%, mild MR, normal PA pressures  . US/HSG  05/2010   possible endometrial polyp, rec rpt 8 wks continue loestrin 24 [facility])    Medical History: Past Medical History:  Diagnosis Date  . Asthma   . CML (chronic myeloid leukemia) ([place]) 2011   (Dr. Alyse Low) stopped gleevec 2/2 side effects  . Diabetes mellitus without complication ([place])   . GERD (gastroesophageal reflux disease)   . History of shingles   . History of syphilis 1990   s/p treatment  . Hypertension   . Leukemia ([place])   . Osteopenia    due to prednisone use, was on reclast  . PUD (peptic ulcer disease)   . Rheumatoid arthritis(714.0) 1990s   Jacoud's arthropathy, previously treated with MTX, TNFa (remicade, enbrel, humira, orencia, rituximab, and actemra)  . SLE (systemic lupus erythematosus) ([place]) 09/2009   Rheum-Dr. Joan Mayans  . Thyroid goiter    Endo-Dr. Eddie Dibbles    Family History: Family History  Problem Relation Age of Onset  . Heart attack Maternal Grandmother   . Coronary artery disease Maternal Grandmother   . Hypertension Mother   . Hypertension Unknown        Aunt    Social History   Socioeconomic History  . Marital status: Single    Spouse name: Not on file  . Number of children: Not on file  . Years of education: Not on file  . Highest  education level: Not on file  Occupational History  . Occupation: Secretary/administrator at [employer]  . Financial resource strain: Not on file  . Food insecurity:    Worry: Not on file    Inability: Not on file  . Transportation needs:    Medical: Not on file    Non-medical: Not on file  Tobacco Use  . Smoking status: Former Smoker    Packs/day: 0.30    Types: Cigarettes  . Smokeless tobacco: Never Used  . Tobacco comment: 6 cigarettes/day  Substance and Sexual Activity  . Alcohol use: No  . Drug use: No  . Sexual activity: Not on file  Lifestyle  . Physical activity:    Days per week: Not on file    Minutes per session: Not on  file  . Stress: Not on file  Relationships  . Social connections:    Talks on phone: Not on file    Gets together: Not on file    Attends religious service: Not on file    Active member of club or organization: Not on file    Attends meetings of clubs or organizations: Not on file    Relationship status: Not on file  . Intimate partner violence:    Fear of current or ex partner: Not on file    Emotionally abused: Not on file    Physically abused: Not on file    Forced sexual activity: Not on file  Other Topics Concern  . Not on file  Social History Narrative   Caffeine: 1 cup/day coffee      Lives with mother, 1 dog      HS education      Review of Systems  Constitutional: Negative for activity change, chills, fatigue and fever.  HENT: Negative for congestion, rhinorrhea, sinus pressure and voice change.        Oral thrush  Respiratory: Negative for apnea, cough, chest tightness, shortness of breath and wheezing.   Cardiovascular: Negative for chest pain, palpitations and leg swelling.  Gastrointestinal: Positive for constipation. Negative for abdominal distention, abdominal pain, diarrhea, nausea and vomiting.       Last BM 01/20/2018  Endocrine: Negative for polydipsia and polyuria.  Genitourinary: Positive for flank pain,  pelvic pain and vaginal discharge. Negative for decreased urine volume, difficulty urinating, dysuria, frequency, genital sores, hematuria, urgency, vaginal bleeding and vaginal pain.  Musculoskeletal: Positive for arthralgias, back pain, gait problem and joint swelling.  Skin: Negative for color change, rash and wound.  Allergic/Immunologic: Negative for environmental allergies.  Neurological: Negative for dizziness, facial asymmetry and headaches.  Hematological: Negative for adenopathy.  Psychiatric/Behavioral: Negative for dysphoric mood. The patient is not nervous/anxious.     Vital Signs: BP (!) 146/70 (BP Location: Left Arm, Patient Position: Sitting, Cuff Size: Large)   Pulse 97   Resp 16   Ht 5\' 4"  (1.626 m)   Wt 246 lb 9.6 oz (111.9 kg)   SpO2 100%   BMI 42.33 kg/m    Physical Exam  Constitutional: She is oriented to person, place, and time. She appears well-developed and well-nourished.  Eyes: Pupils are equal, round, and reactive to light. Conjunctivae and EOM are normal.  Cardiovascular: Normal rate, regular rhythm and normal heart sounds. Exam reveals no gallop and no friction rub.  No murmur heard. Pulmonary/Chest: Effort normal and breath sounds normal. No respiratory distress. She has no wheezes. She has no rales. She exhibits no tenderness.  Abdominal: Soft. Bowel sounds are normal. She exhibits no distension. There is no tenderness. There is no guarding.  Genitourinary:  Genitourinary Comments: Urine sample is positive for nitrites. There is moderate blood and WBC present   Musculoskeletal:  Generalized joint pain with point tenderness prese  Neurological: She is alert and oriented to person, place, and time.  Skin: Skin is warm and dry.  Psychiatric: She has a normal mood and affect. Her behavior is normal. Judgment and thought content normal.  Nursing note and vitals reviewed.    Assessment/Plan:  1. Urinary tract infection with hematuria, site  unspecified positive for UTI (presence of nitrites, leukocytes and blood) - cephALEXin (KEFLEX) 500 MG capsule; Take 1 capsule (500 mg total) by mouth 3 (three) times daily.  Dispense: 30 capsule; Refill: 0  2. Hematuria, unspecified type Present on  urinalysis. Will ultrasound kidneys for further evaluation.  - US Renal; Future  3. Bacterial vaginitis - metroNIDAZOLE (METROGEL) 0.75 % vaginal gel; Place 1 Applicatorful vaginally 2 (two) times daily.  Dispense: 70 g; Refill: 0  4. Dysuria Routine urinalysis based on pt's symptoms - POCT Urinalysis Dipstick - CULTURE, URINE COMPREHENSIVE  5. Rheumatoid arthritis involving multiple sites with positive rheumatoid factor ([place]) Continue with prescribed medication - traMADol (ULTRAM) 50 MG tablet; Take 2 tablets (100 mg total) by mouth 2 (two) times daily.  Dispense: 120 tablet; Refill: 3  6. FLATULENCE ERUCTATION AND GAS PAIN Continue with prescribed medication - omeprazole (PRILOSEC) 20 MG capsule; Take 1 capsule (20 mg total) by mouth daily.  Dispense: 90 capsule; Refill: 3  7. Depression, major, recurrent, moderate (HCC) Continue with current treatment - DULoxetine (CYMBALTA) 20 MG capsule; Take 1 capsule (20 mg total) by mouth daily.  Dispense: 90 capsule; Refill: 3  8. Oral candidiasis Laura Chen is present , nystatin refilled  9. Vaginal candidiasis Metronidazole gel prescribed to relieve the symptoms  General Counseling: Laura Chen verbalizes understanding of the findings of todays visit and agrees with plan of treatment. I have discussed any further diagnostic evaluation that may be needed or ordered today. We also reviewed her medications today. she has been encouraged to call the office with any questions or concerns that should arise related to todays visit.  This patient was seen by Leretha Pol FNP Collaboration with Dr Lavera Guise as a part of collaborative care agreement  Orders Placed This Encounter  Procedures  . CULTURE,  URINE COMPREHENSIVE  . US Renal  . POCT Urinalysis Dipstick    Meds ordered this encounter  Medications  . metroNIDAZOLE (METROGEL) 0.75 % vaginal gel    Sig: Place 1 Applicatorful vaginally 2 (two) times daily.    Dispense:  70 g    Refill:  0    Order Specific Question:   Supervising Provider    Answer:   Lavera Guise [[id number]]  . cephALEXin (KEFLEX) 500 MG capsule    Sig: Take 1 capsule (500 mg total) by mouth 3 (three) times daily.    Dispense:  30 capsule    Refill:  0    Order Specific Question:   Supervising Provider    Answer:   Lavera Guise [[id number]]  . traMADol (ULTRAM) 50 MG tablet    Sig: Take 2 tablets (100 mg total) by mouth 2 (two) times daily.    Dispense:  120 tablet    Refill:  3    Order Specific Question:   Supervising Provider    Answer:   Lavera Guise [[id number]]  . omeprazole (PRILOSEC) 20 MG capsule    Sig: Take 1 capsule (20 mg total) by mouth daily.    Dispense:  90 capsule    Refill:  3    Order Specific Question:   Supervising Provider    Answer:   Lavera Guise [[id number]]  . DULoxetine (CYMBALTA) 20 MG capsule    Sig: Take 1 capsule (20 mg total) by mouth daily.    Dispense:  90 capsule    Refill:  3    Order Specific Question:   Supervising Provider    Answer:   Lavera Guise [[id number]]    Time spent: 19 Minutes      Dr Lavera Guise Internal medicine

## 2018-01-22 ENCOUNTER — Inpatient Hospital Stay: Payer: Medicare Other | Attending: Oncology

## 2018-01-22 ENCOUNTER — Inpatient Hospital Stay (HOSPITAL_BASED_OUTPATIENT_CLINIC_OR_DEPARTMENT_OTHER): Payer: Medicare Other | Admitting: Oncology

## 2018-01-22 ENCOUNTER — Other Ambulatory Visit: Payer: Self-pay

## 2018-01-22 VITALS — BP 130/78 | HR 91 | Temp 96.6°F | Ht 64.0 in | Wt 244.1 lb

## 2018-01-22 DIAGNOSIS — C9210 Chronic myeloid leukemia, BCR/ABL-positive, not having achieved remission: Secondary | ICD-10-CM

## 2018-01-22 DIAGNOSIS — C9211 Chronic myeloid leukemia, BCR/ABL-positive, in remission: Secondary | ICD-10-CM | POA: Insufficient documentation

## 2018-01-22 DIAGNOSIS — I10 Essential (primary) hypertension: Secondary | ICD-10-CM | POA: Diagnosis not present

## 2018-01-22 DIAGNOSIS — M069 Rheumatoid arthritis, unspecified: Secondary | ICD-10-CM | POA: Diagnosis not present

## 2018-01-22 LAB — CBC WITH DIFFERENTIAL/PLATELET
Abs Immature Granulocytes: 0.13 10*3/uL — ABNORMAL HIGH (ref 0.00–0.07)
Basophils Absolute: 0.1 10*3/uL (ref 0.0–0.1)
Basophils Relative: 1 %
Eosinophils Absolute: 0.1 10*3/uL (ref 0.0–0.5)
Eosinophils Relative: 1 %
HCT: 42.7 % (ref 36.0–46.0)
Hemoglobin: 12.5 g/dL (ref 12.0–15.0)
Immature Granulocytes: 1 %
Lymphocytes Relative: 20 %
Lymphs Abs: 1.8 10*3/uL (ref 0.7–4.0)
MCH: 22.4 pg — ABNORMAL LOW (ref 26.0–34.0)
MCHC: 29.3 g/dL — ABNORMAL LOW (ref 30.0–36.0)
MCV: 76.7 fL — ABNORMAL LOW (ref 80.0–100.0)
Monocytes Absolute: 1.1 10*3/uL — ABNORMAL HIGH (ref 0.1–1.0)
Monocytes Relative: 12 %
Neutro Abs: 6 10*3/uL (ref 1.7–7.7)
Neutrophils Relative %: 65 %
Platelets: 265 10*3/uL (ref 150–400)
RBC: 5.57 MIL/uL — ABNORMAL HIGH (ref 3.87–5.11)
RDW: 23.1 % — ABNORMAL HIGH (ref 11.5–15.5)
WBC: 9.2 10*3/uL (ref 4.0–10.5)
nRBC: 0 % (ref 0.0–0.2)

## 2018-01-22 LAB — COMPREHENSIVE METABOLIC PANEL
ALT: 76 U/L — ABNORMAL HIGH (ref 0–44)
AST: 46 U/L — ABNORMAL HIGH (ref 15–41)
Albumin: 3.7 g/dL (ref 3.5–5.0)
Alkaline Phosphatase: 199 U/L — ABNORMAL HIGH (ref 38–126)
Anion gap: 8 (ref 5–15)
BUN: 11 mg/dL (ref 6–20)
CO2: 24 mmol/L (ref 22–32)
Calcium: 8.5 mg/dL — ABNORMAL LOW (ref 8.9–10.3)
Chloride: 108 mmol/L (ref 98–111)
Creatinine, Ser: 1.3 mg/dL — ABNORMAL HIGH (ref 0.44–1.00)
GFR calc Af Amer: 55 mL/min — ABNORMAL LOW (ref >60)
GFR calc non Af Amer: 48 mL/min — ABNORMAL LOW (ref >60)
Glucose, Bld: 145 mg/dL — ABNORMAL HIGH (ref 70–99)
Potassium: 4 mmol/L (ref 3.5–5.1)
Sodium: 140 mmol/L (ref 135–145)
Total Bilirubin: 0.3 mg/dL (ref 0.3–1.2)
Total Protein: 6.5 g/dL (ref 6.5–8.1)

## 2018-01-22 LAB — MAGNESIUM: Magnesium: 2 mg/dL (ref 1.7–2.4)

## 2018-01-22 NOTE — Progress Notes (Signed)
Patient is here to follow up on her chronic myeloid leukemia. Patient stated that she had been doing well.

## 2018-01-23 ENCOUNTER — Encounter: Admit: 2018-01-23 | Discharge: 2018-01-24 | Payer: MEDICARE

## 2018-01-23 DIAGNOSIS — N760 Acute vaginitis: Secondary | ICD-10-CM

## 2018-01-23 DIAGNOSIS — B9689 Other specified bacterial agents as the cause of diseases classified elsewhere: Secondary | ICD-10-CM | POA: Insufficient documentation

## 2018-01-23 DIAGNOSIS — M059 Rheumatoid arthritis with rheumatoid factor, unspecified: Secondary | ICD-10-CM | POA: Diagnosis not present

## 2018-01-23 DIAGNOSIS — R319 Hematuria, unspecified: Secondary | ICD-10-CM | POA: Insufficient documentation

## 2018-01-23 DIAGNOSIS — M069 Rheumatoid arthritis, unspecified: Secondary | ICD-10-CM

## 2018-01-24 LAB — CULTURE, URINE COMPREHENSIVE

## 2018-01-28 LAB — BCR-ABL1, CML/ALL, PCR, QUANT

## 2018-02-01 ENCOUNTER — Other Ambulatory Visit: Payer: Self-pay

## 2018-02-06 ENCOUNTER — Ambulatory Visit: Admit: 2018-02-06 | Discharge: 2018-02-07 | Payer: MEDICARE

## 2018-02-06 DIAGNOSIS — M059 Rheumatoid arthritis with rheumatoid factor, unspecified: Secondary | ICD-10-CM | POA: Diagnosis not present

## 2018-02-06 DIAGNOSIS — M069 Rheumatoid arthritis, unspecified: Secondary | ICD-10-CM

## 2018-02-07 MED ORDER — PONATINIB 15 MG TABLET
ORAL_TABLET | Freq: Every day | ORAL | 11 refills | 0 days | Status: CP
Start: 2018-02-07 — End: 2018-02-12

## 2018-02-11 ENCOUNTER — Ambulatory Visit (INDEPENDENT_AMBULATORY_CARE_PROVIDER_SITE_OTHER): Payer: Medicare Other | Admitting: Nurse Practitioner

## 2018-02-11 ENCOUNTER — Encounter: Payer: Self-pay | Admitting: Nurse Practitioner

## 2018-02-11 VITALS — BP 132/84 | HR 99 | Resp 16 | Ht 64.0 in | Wt 245.8 lb

## 2018-02-11 DIAGNOSIS — M329 Systemic lupus erythematosus, unspecified: Secondary | ICD-10-CM | POA: Diagnosis not present

## 2018-02-11 DIAGNOSIS — R319 Hematuria, unspecified: Secondary | ICD-10-CM

## 2018-02-11 DIAGNOSIS — B370 Candidal stomatitis: Secondary | ICD-10-CM

## 2018-02-11 DIAGNOSIS — C9210 Chronic myeloid leukemia, BCR/ABL-positive, not having achieved remission: Secondary | ICD-10-CM

## 2018-02-11 DIAGNOSIS — N390 Urinary tract infection, site not specified: Secondary | ICD-10-CM | POA: Diagnosis not present

## 2018-02-11 DIAGNOSIS — M0579 Rheumatoid arthritis with rheumatoid factor of multiple sites without organ or systems involvement: Secondary | ICD-10-CM

## 2018-02-11 LAB — POCT URINALYSIS DIPSTICK
Bilirubin, UA: NEGATIVE
Glucose, UA: NEGATIVE
Ketones, UA: NEGATIVE
Nitrite, UA: NEGATIVE
Protein, UA: POSITIVE — AB
Spec Grav, UA: 1.01 (ref 1.010–1.025)
Urobilinogen, UA: 0.2 E.U./dL
pH, UA: 7.5 (ref 5.0–8.0)

## 2018-02-11 MED ORDER — FLUCONAZOLE 150 MG PO TABS: ORAL_TABLET | ORAL | 1 refills | Status: DC

## 2018-02-11 NOTE — Progress Notes (Signed)
[facility] [place], [place] [address]  Internal MEDICINE  Office Visit Note  Patient Name: Laura Chen  [id number]  [id number]  Date of Service: 02/11/2018  Chief Complaint  Patient presents with  . Medical Management of Chronic Issues    3wk follow up  . Labs Only    review ultrasound  . Urinary Tract Infection    repeat U/A    The patient is here for follow up of UTI. She finished antibiotics. Feels better. Urine culture was positive for normal urogenital flora. She feels better. She does still have moderate WBC in the urine, however, nitrates are negative and there is only trace blood. She does have thrush. Using nystatin swish and swallow. Has not taken diflucan. Did get her rituxin last week. Gets 90mg  IV steroids. Face is puffy and this is likely contributing to incidence of infection.       Current Medication: Outpatient Encounter Medications as of 02/11/2018  Medication Sig  . ACCU-CHEK FASTCLIX LANCETS MISC USE TO TEST BLOOD SUGAR QD UTD  . aspirin EC 81 MG tablet Take 81 mg by mouth every evening.   Marland Kitchen azaTHIOprine (IMURAN) 50 MG tablet Take 50 mg by mouth daily.  . baclofen (LIORESAL) 10 MG tablet Take 10 mg by mouth 3 (three) times daily.  Marland Kitchen buPROPion (WELLBUTRIN SR) 100 MG 12 hr tablet Take 1 tablet (100 mg total) by mouth daily.  . cephALEXin (KEFLEX) 500 MG capsule Take 1 capsule (500 mg total) by mouth 3 (three) times daily.  . cetirizine (ZYRTEC) 10 MG tablet Take 10 mg by mouth.  . cholecalciferol (VITAMIN D) 1000 units tablet Take 2,000 Units by mouth daily.  . DULoxetine (CYMBALTA) 20 MG capsule Take 1 capsule (20 mg total) by mouth daily.  . fluconazole (DIFLUCAN) 150 MG tablet Take 1 tablet po once. May repeat dose in 3 days as needed for persistent symptoms.  . fluticasone (FLONASE) 50 MCG/ACT nasal spray USE 1 SPRAY IN BOTH  NOSTRILS DAILY.  Marland Kitchen glucose blood (ACCU-CHEK GUIDE) test strip Use to test blood sugar QD UTD, normal  dx e11.65  . linaclotide (LINZESS) 290 MCG CAPS capsule Take 1 capsule (290 mcg total) by mouth daily.  . metroNIDAZOLE (METROGEL) 0.75 % vaginal gel Place 1 Applicatorful vaginally 2 (two) times daily.  Marland Kitchen nystatin (MYCOSTATIN) 100000 UNIT/ML suspension Take 5 mLs (500,000 Units total) by mouth 4 (four) times daily.  Marland Kitchen omeprazole (PRILOSEC) 20 MG capsule Take 1 capsule (20 mg total) by mouth daily.  . Oxycodone HCl 10 MG TABS Take 10 mg by mouth 3 (three) times daily as needed (for pain).  . phenazopyridine (PYRIDIUM) 200 MG tablet Take 1 tablet (200 mg total) by mouth 3 (three) times daily as needed for pain.  . ponatinib HCl (ICLUSIG) 15 MG tablet Take 15 mg by mouth daily.   . potassium chloride SA (K-DUR,KLOR-CON) 20 MEQ tablet Take 1 tablet (20 mEq total) by mouth 2 (two) times daily.  . predniSONE (DELTASONE) 5 MG tablet Take 5 mg by mouth daily with breakfast.  . rosuvastatin (CRESTOR) 5 MG tablet TAKE 1 TABLET BY MOUTH  DAILY WITH SUPPER  . traMADol (ULTRAM) 50 MG tablet Take 2 tablets (100 mg total) by mouth 2 (two) times daily.  . [DISCONTINUED] fluconazole (DIFLUCAN) 150 MG tablet Take 1 tablet po once. May repeat dose in 3 days as needed for persistent symptoms.  . nitrofurantoin, macrocrystal-monohydrate, (MACROBID) 100 MG capsule Take 1 capsule (100 mg total)  by mouth 2 (two) times daily. (Patient not taking: Reported on 02/11/2018)   No facility-administered encounter medications on file as of 02/11/2018.     Surgical History: Past Surgical History:  Procedure Laterality Date  . DEXA  2010   osteopenia  . MYOMECTOMY  2008  . right ankle AFO  2011  . TRANSTHORACIC ECHOCARDIOGRAM  09/1015   nl systolic/diastolic fxn, EF 51%, mild MR, normal PA pressures  . US/HSG  05/2010   possible endometrial polyp, rec rpt 8 wks continue loestrin 24 [facility])    Medical History: Past Medical History:  Diagnosis Date  . Asthma   . CML (chronic myeloid leukemia) ([place]) 2011   (Dr.  Alyse Low) stopped gleevec 2/2 side effects  . Diabetes mellitus without complication ([place])   . GERD (gastroesophageal reflux disease)   . History of shingles   . History of syphilis 1990   s/p treatment  . Hypertension   . Leukemia ([place])   . Osteopenia    due to prednisone use, was on reclast  . PUD (peptic ulcer disease)   . Rheumatoid arthritis(714.0) 1990s   Jacoud's arthropathy, previously treated with MTX, TNFa (remicade, enbrel, humira, orencia, rituximab, and actemra)  . SLE (systemic lupus erythematosus) ([place]) 09/2009   Rheum-Dr. Joan Mayans  . Thyroid goiter    Endo-Dr. Eddie Dibbles    Family History: Family History  Problem Relation Age of Onset  . Heart attack Maternal Grandmother   . Coronary artery disease Maternal Grandmother   . Hypertension Mother   . Hypertension Other        Aunt    Social History   Socioeconomic History  . Marital status: Single    Spouse name: Not on file  . Number of children: Not on file  . Years of education: Not on file  . Highest education level: Not on file  Occupational History  . Occupation: Secretary/administrator at [employer]  . Financial resource strain: Not on file  . Food insecurity:    Worry: Not on file    Inability: Not on file  . Transportation needs:    Medical: Not on file    Non-medical: Not on file  Tobacco Use  . Smoking status: Former Smoker    Packs/day: 0.30    Types: Cigarettes  . Smokeless tobacco: Never Used  . Tobacco comment: 6 cigarettes/day  Substance and Sexual Activity  . Alcohol use: No  . Drug use: No  . Sexual activity: Not on file  Lifestyle  . Physical activity:    Days per week: Not on file    Minutes per session: Not on file  . Stress: Not on file  Relationships  . Social connections:    Talks on phone: Not on file    Gets together: Not on file    Attends religious service: Not on file    Active member of club or organization: Not on file    Attends meetings of clubs or  organizations: Not on file    Relationship status: Not on file  . Intimate partner violence:    Fear of current or ex partner: Not on file    Emotionally abused: Not on file    Physically abused: Not on file    Forced sexual activity: Not on file  Other Topics Concern  . Not on file  Social History Narrative   Caffeine: 1 cup/day coffee      Lives with mother, 1 dog  [employer] education      Review of Systems  Constitutional: Negative for activity change, chills, fatigue and fever.  HENT: Negative for congestion, rhinorrhea, sinus pressure and voice change.        Oral thrush  Respiratory: Negative for apnea, cough, chest tightness, shortness of breath and wheezing.   Cardiovascular: Negative for chest pain, palpitations and leg swelling.  Gastrointestinal: Positive for constipation. Negative for abdominal distention, abdominal pain, diarrhea, nausea and vomiting.  Endocrine: Negative for polydipsia and polyuria.  Genitourinary: Negative for decreased urine volume, difficulty urinating, dysuria, flank pain, frequency, genital sores, hematuria, pelvic pain, urgency, vaginal bleeding, vaginal discharge and vaginal pain.  Musculoskeletal: Positive for arthralgias, gait problem and joint swelling. Negative for back pain.  Skin: Negative for color change, rash and wound.  Allergic/Immunologic: Negative for environmental allergies.  Neurological: Positive for weakness. Negative for dizziness, facial asymmetry and headaches.  Hematological: Negative for adenopathy.  Psychiatric/Behavioral: Negative for dysphoric mood. The patient is not nervous/anxious.    Today's Vitals   02/11/18 1145  BP: 132/84  Pulse: 99  Resp: 16  SpO2: 99%  Weight: 245 lb 12.8 oz (111.5 kg)  Height: 5\' 4"  (1.626 m)    Physical Exam Vitals signs and nursing note reviewed.  Constitutional:      Appearance: Normal appearance. She is well-developed.  HENT:     Head: Normocephalic and atraumatic.      Mouth/Throat:     Pharynx: Oropharyngeal exudate present.     Comments: Positive for oral thrush  Eyes:     Pupils: Pupils are equal, round, and reactive to light.  Neck:     Musculoskeletal: Normal range of motion and neck supple.  Cardiovascular:     Rate and Rhythm: Normal rate and regular rhythm.     Heart sounds: Normal heart sounds. No murmur. No friction rub. No gallop.   Pulmonary:     Effort: Pulmonary effort is normal. No respiratory distress.     Breath sounds: Normal breath sounds. No wheezing or rales.  Chest:     Chest wall: No tenderness.  Abdominal:     General: Bowel sounds are normal. There is no distension.     Palpations: Abdomen is soft.     Tenderness: There is no abdominal tenderness. There is no guarding.  Genitourinary:    Comments: U/a positive for moderate WBC, trace blood, and positive for protein. Musculoskeletal:     Comments: Generalized joint pain with point tenderness prese  Skin:    General: Skin is warm and dry.  Neurological:     Mental Status: She is alert and oriented to person, place, and time. Mental status is at baseline.  Psychiatric:        Behavior: Behavior normal.        Thought Content: Thought content normal.        Judgment: Judgment normal.   Assessment/Plan: 1. Urinary tract infection with hematuria, site unspecified U/a positive for protein, moderate WBC, and trace blood. Improved from last visit and patient is asymptomatic. Will send for culture and sensitivity and treat as indicated.  - POCT Urinalysis Dipstick - CULTURE, URINE COMPREHENSIVE  2. Oral candidiasis Add diflucan 150mg  once. May repeat dose in 3 days for persistent symptoms. Continue to use nystatin swish and swallow QID.  - fluconazole (DIFLUCAN) 150 MG tablet; Take 1 tablet po once. May repeat dose in 3 days as needed for persistent symptoms.  Dispense: 3 tablet; Refill: 1  3. Chronic myeloid leukemia ([place])  rutuxin treatment last week. Continue regular  visits with oncology as scheduled.   4. Systemic lupus erythematosus, unspecified SLE type, unspecified organ involvement status ([place]) Regular visits with rheumatology as scheduled.   5. Rheumatoid arthritis involving multiple sites with positive rheumatoid factor (HCC) Regular visits with rheumatology as scheduled.   General Counseling: Zetta verbalizes understanding of the findings of todays visit and agrees with plan of treatment. I have discussed any further diagnostic evaluation that may be needed or ordered today. We also reviewed her medications today. she has been encouraged to call the office with any questions or concerns that should arise related to todays visit.  This patient was seen by Leretha Pol FNP Collaboration with Dr Lavera Guise as a part of collaborative care agreement  Orders Placed This Encounter  Procedures  . CULTURE, URINE COMPREHENSIVE  . POCT Urinalysis Dipstick    Meds ordered this encounter  Medications  . fluconazole (DIFLUCAN) 150 MG tablet    Sig: Take 1 tablet po once. May repeat dose in 3 days as needed for persistent symptoms.    Dispense:  3 tablet    Refill:  1    Order Specific Question:   Supervising Provider    Answer:   Lavera Guise [[id number]]    Time spent: 55 Minutes      Dr Lavera Guise Internal medicine

## 2018-02-12 MED ORDER — PONATINIB 15 MG TABLET
ORAL_TABLET | Freq: Every day | ORAL | 11 refills | 0.00000 days | Status: CP
Start: 2018-02-12 — End: 2018-04-17

## 2018-02-14 LAB — CULTURE, URINE COMPREHENSIVE

## 2018-02-15 MED ORDER — OXYCODONE 10 MG TABLET
ORAL_TABLET | Freq: Three times a day (TID) | ORAL | 0 refills | 0 days | Status: CP | PRN
Start: 2018-02-15 — End: 2018-03-22

## 2018-02-21 ENCOUNTER — Ambulatory Visit (INDEPENDENT_AMBULATORY_CARE_PROVIDER_SITE_OTHER): Payer: Medicare Other | Admitting: Nurse Practitioner

## 2018-02-21 ENCOUNTER — Encounter: Payer: Self-pay | Admitting: Nurse Practitioner

## 2018-02-21 VITALS — BP 130/84 | HR 79 | Resp 16 | Ht 64.0 in | Wt 241.8 lb

## 2018-02-21 DIAGNOSIS — N760 Acute vaginitis: Secondary | ICD-10-CM

## 2018-02-21 DIAGNOSIS — B373 Candidiasis of vulva and vagina: Secondary | ICD-10-CM | POA: Diagnosis not present

## 2018-02-21 DIAGNOSIS — B370 Candidal stomatitis: Secondary | ICD-10-CM | POA: Diagnosis not present

## 2018-02-21 DIAGNOSIS — B3731 Acute candidiasis of vulva and vagina: Secondary | ICD-10-CM

## 2018-02-21 MED ORDER — METRONIDAZOLE 500 MG PO TABS: 500.0000 mg | ORAL_TABLET | Freq: Two times a day (BID) | ORAL | 0 refills | Status: DC

## 2018-02-21 MED ORDER — KETOCONAZOLE 2 % EX CREA: 1.0000 "application " | TOPICAL_CREAM | Freq: Every day | CUTANEOUS | 2 refills | Status: DC

## 2018-02-21 MED ORDER — NYSTATIN 100000 UNIT/ML MT SUSP: 5.0000 mL | Freq: Four times a day (QID) | OROMUCOSAL | 2 refills | Status: DC

## 2018-02-21 MED ORDER — FLUCONAZOLE 150 MG PO TABS: ORAL_TABLET | ORAL | 5 refills | Status: DC

## 2018-02-21 NOTE — Progress Notes (Signed)
[facility] [place], [place] [address]  Internal MEDICINE  Office Visit Note  Patient Name: Laura Chen  [id number]  [id number]  Date of Service: 02/24/2018   Pt is here for a sick visit.  Chief Complaint  Patient presents with  . Vaginal Itching    pt thinks she could have posssibly been exposed to an STD, blood and odor in vaginal area, swollen, pt noticed some bumps there.  symptomsstarted two days ago.      The patient continues to have vaginal itching and irritation. This is mostly along the outer walls of the labia. Has a rash long the labia. She is also c/o mild vaginal discharge with slight odor. She has been treated on multiple occassions for urinary tract infection and possible vaginitis. Urine cultures have come back with normal urogenital flora. Despite treatment, these symptoms have continued. She states that right before these symptoms started, she did have episode of unprotected sex. She is unsure if this is contributing to the urinary and vaginal symptoms she is having, but she would like to make sure.        Current Medication:  Outpatient Encounter Medications as of 02/21/2018  Medication Sig  . ACCU-CHEK FASTCLIX LANCETS MISC USE TO TEST BLOOD SUGAR QD UTD  . aspirin EC 81 MG tablet Take 81 mg by mouth every evening.   Marland Kitchen azaTHIOprine (IMURAN) 50 MG tablet Take 50 mg by mouth daily.  . baclofen (LIORESAL) 10 MG tablet Take 10 mg by mouth 3 (three) times daily.  Marland Kitchen buPROPion (WELLBUTRIN SR) 100 MG 12 hr tablet Take 1 tablet (100 mg total) by mouth daily.  . cephALEXin (KEFLEX) 500 MG capsule Take 1 capsule (500 mg total) by mouth 3 (three) times daily.  . cetirizine (ZYRTEC) 10 MG tablet Take 10 mg by mouth.  . cholecalciferol (VITAMIN D) 1000 units tablet Take 2,000 Units by mouth daily.  . DULoxetine (CYMBALTA) 20 MG capsule Take 1 capsule (20 mg total) by mouth daily.  . fluconazole (DIFLUCAN) 150 MG tablet Take 1 tablet po once  weekly  . fluticasone (FLONASE) 50 MCG/ACT nasal spray USE 1 SPRAY IN BOTH  NOSTRILS DAILY.  Marland Kitchen glucose blood (ACCU-CHEK GUIDE) test strip Use to test blood sugar QD UTD, normal dx e11.65  . linaclotide (LINZESS) 290 MCG CAPS capsule Take 1 capsule (290 mcg total) by mouth daily.  . metroNIDAZOLE (METROGEL) 0.75 % vaginal gel Place 1 Applicatorful vaginally 2 (two) times daily.  Marland Kitchen nystatin (MYCOSTATIN) 100000 UNIT/ML suspension Take 5 mLs (500,000 Units total) by mouth 4 (four) times daily.  Marland Kitchen omeprazole (PRILOSEC) 20 MG capsule Take 1 capsule (20 mg total) by mouth daily.  . Oxycodone HCl 10 MG TABS Take 10 mg by mouth 3 (three) times daily as needed (for pain).  . phenazopyridine (PYRIDIUM) 200 MG tablet Take 1 tablet (200 mg total) by mouth 3 (three) times daily as needed for pain.  . ponatinib HCl (ICLUSIG) 15 MG tablet Take 15 mg by mouth daily.   . potassium chloride SA (K-DUR,KLOR-CON) 20 MEQ tablet Take 1 tablet (20 mEq total) by mouth 2 (two) times daily.  . predniSONE (DELTASONE) 5 MG tablet Take 5 mg by mouth daily with breakfast.  . rosuvastatin (CRESTOR) 5 MG tablet TAKE 1 TABLET BY MOUTH  DAILY WITH SUPPER  . traMADol (ULTRAM) 50 MG tablet Take 2 tablets (100 mg total) by mouth 2 (two) times daily.  . [DISCONTINUED] fluconazole (DIFLUCAN) 150 MG tablet Take  1 tablet po once. May repeat dose in 3 days as needed for persistent symptoms.  . [DISCONTINUED] nystatin (MYCOSTATIN) 100000 UNIT/ML suspension Take 5 mLs (500,000 Units total) by mouth 4 (four) times daily.  Marland Kitchen ketoconazole (NIZORAL) 2 % cream Apply 1 application topically daily.  . metroNIDAZOLE (FLAGYL) 500 MG tablet Take 1 tablet (500 mg total) by mouth 2 (two) times daily.  . nitrofurantoin, macrocrystal-monohydrate, (MACROBID) 100 MG capsule Take 1 capsule (100 mg total) by mouth 2 (two) times daily. (Patient not taking: Reported on 02/11/2018)   No facility-administered encounter medications on file as of 02/21/2018.        Medical History: Past Medical History:  Diagnosis Date  . Asthma   . CML (chronic myeloid leukemia) ([place]) 2011   (Dr. Alyse Low) stopped gleevec 2/2 side effects  . Diabetes mellitus without complication ([place])   . GERD (gastroesophageal reflux disease)   . History of shingles   . History of syphilis 1990   s/p treatment  . Hypertension   . Leukemia ([place])   . Osteopenia    due to prednisone use, was on reclast  . PUD (peptic ulcer disease)   . Rheumatoid arthritis(714.0) 1990s   Jacoud's arthropathy, previously treated with MTX, TNFa (remicade, enbrel, humira, orencia, rituximab, and actemra)  . SLE (systemic lupus erythematosus) ([place]) 09/2009   Rheum-Dr. Joan Mayans  . Thyroid goiter    Endo-Dr. Eddie Dibbles     Today's Vitals   02/21/18 1455  BP: 130/84  Pulse: 79  Resp: 16  SpO2: 94%  Weight: 241 lb 12.8 oz (109.7 kg)  Height: 5\' 4"  (1.626 m)    Review of Systems  Constitutional: Negative for activity change, chills, fatigue and fever.  HENT: Negative for congestion, rhinorrhea, sinus pressure and voice change.        Oral thrush  Respiratory: Negative for apnea, cough, chest tightness, shortness of breath and wheezing.   Cardiovascular: Negative for chest pain, palpitations and leg swelling.  Gastrointestinal: Negative for abdominal distention, abdominal pain, constipation, diarrhea, nausea and vomiting.  Endocrine: Negative for polydipsia and polyuria.  Genitourinary: Positive for dysuria, pelvic pain and vaginal discharge. Negative for decreased urine volume, difficulty urinating, flank pain, frequency, genital sores, hematuria, urgency, vaginal bleeding and vaginal pain.       Vaginal itching and irritation. Rash along the outer aspect of the labia.   Musculoskeletal: Positive for arthralgias, gait problem and joint swelling. Negative for back pain.  Skin: Negative for color change, rash and wound.  Allergic/Immunologic: Negative for environmental allergies.   Neurological: Positive for weakness. Negative for dizziness, facial asymmetry and headaches.  Hematological: Negative for adenopathy.  Psychiatric/Behavioral: Negative for dysphoric mood. The patient is not nervous/anxious.     Physical Exam Vitals signs and nursing note reviewed.  Constitutional:      General: She is not in acute distress.    Appearance: Normal appearance. She is well-developed. She is not diaphoretic.  HENT:     Head: Normocephalic and atraumatic.     Mouth/Throat:     Mouth: Oral lesions present.     Tongue: Lesions present.     Pharynx: No oropharyngeal exudate.     Comments: Continues to have plaques of oral thrush on the tongue.  Eyes:     Pupils: Pupils are equal, round, and reactive to light.  Neck:     Musculoskeletal: Normal range of motion and neck supple.     Thyroid: No thyromegaly.     Vascular: No JVD.  Trachea: No tracheal deviation.  Cardiovascular:     Rate and Rhythm: Normal rate and regular rhythm.     Heart sounds: Normal heart sounds. No murmur. No friction rub. No gallop.   Pulmonary:     Effort: Pulmonary effort is normal. No respiratory distress.     Breath sounds: Normal breath sounds. No wheezing or rales.  Chest:     Chest wall: No tenderness.  Abdominal:     General: Bowel sounds are normal.     Palpations: Abdomen is soft.     Tenderness: There is no abdominal tenderness.  Genitourinary:    Exam position: Supine.     Labia:        Right: Rash present.        Left: Rash present.      Vagina: Vaginal discharge and erythema present.     Comments: There is redness, with fine, red, raised rash along bilateral labia majora. Tender to touch. There is small amount of yellowish vaginal discharge. No odor noted today.  Musculoskeletal: Normal range of motion.  Lymphadenopathy:     Cervical: No cervical adenopathy.  Skin:    General: Skin is warm and dry.  Neurological:     Mental Status: She is alert and oriented to person,  place, and time. Mental status is at baseline.     Cranial Nerves: No cranial nerve deficit.  Psychiatric:        Behavior: Behavior normal.        Thought Content: Thought content normal.        Judgment: Judgment normal.    Assessment/Plan: 1. Acute vaginitis Unclear etiology. Will start metronidazole 500mg  twice daily for next ten days. Specimen obtained for NuSwab analysis. - metroNIDAZOLE (FLAGYL) 500 MG tablet; Take 1 tablet (500 mg total) by mouth 2 (two) times daily.  Dispense: 20 tablet; Refill: 0  2. Vaginal candidiasis Ketoconazole cream should be inserted vaginally every night for 7 nights. Specimen obtained for NuSwab analysis.  - ketoconazole (NIZORAL) 2 % cream; Apply 1 application topically daily.  Dispense: 60 g; Refill: 2 - NuSwab Vaginitis Plus (VG+)  3. Oral candidiasis Add diflucan 150mg  tablets once weekly. Continue with nystatin suspension. Swish and swallow 31mls four times daily.  - nystatin (MYCOSTATIN) 100000 UNIT/ML suspension; Take 5 mLs (500,000 Units total) by mouth 4 (four) times daily.  Dispense: 200 mL; Refill: 2 - fluconazole (DIFLUCAN) 150 MG tablet; Take 1 tablet po once weekly  Dispense: 4 tablet; Refill: 5  General Counseling: Abra verbalizes understanding of the findings of todays visit and agrees with plan of treatment. I have discussed any further diagnostic evaluation that may be needed or ordered today. We also reviewed her medications today. she has been encouraged to call the office with any questions or concerns that should arise related to todays visit.    Counseling:  This patient was seen by Leretha Pol FNP Collaboration with Dr Lavera Guise as a part of collaborative care agreement  Orders Placed This Encounter  Procedures  . NuSwab Vaginitis Plus (VG+)    Meds ordered this encounter  Medications  . nystatin (MYCOSTATIN) 100000 UNIT/ML suspension    Sig: Take 5 mLs (500,000 Units total) by mouth 4 (four) times daily.     Dispense:  200 mL    Refill:  2    Order Specific Question:   Supervising Provider    Answer:   Lavera Guise [[id number]]  . fluconazole (DIFLUCAN) 150 MG tablet  Sig: Take 1 tablet po once weekly    Dispense:  4 tablet    Refill:  5    Order Specific Question:   Supervising Provider    Answer:   Lavera Guise [place]  . metroNIDAZOLE (FLAGYL) 500 MG tablet    Sig: Take 1 tablet (500 mg total) by mouth 2 (two) times daily.    Dispense:  20 tablet    Refill:  0    Order Specific Question:   Supervising Provider    Answer:   Lavera Guise [[id number]]  . ketoconazole (NIZORAL) 2 % cream    Sig: Apply 1 application topically daily.    Dispense:  60 g    Refill:  2    Order Specific Question:   Supervising Provider    Answer:   Lavera Guise [[id number]]    Time spent: 30 Minutes

## 2018-02-23 LAB — NUSWAB VAGINITIS PLUS (VG+)
Candida albicans, NAA: NEGATIVE
Candida glabrata, NAA: NEGATIVE
Chlamydia trachomatis, NAA: NEGATIVE
Neisseria gonorrhoeae, NAA: NEGATIVE
Trich vag by NAA: NEGATIVE

## 2018-02-24 DIAGNOSIS — N760 Acute vaginitis: Secondary | ICD-10-CM | POA: Insufficient documentation

## 2018-02-27 ENCOUNTER — Telehealth: Payer: Self-pay

## 2018-02-27 ENCOUNTER — Other Ambulatory Visit: Payer: Self-pay

## 2018-02-27 NOTE — Telephone Encounter (Signed)
Pt advised that Nuswab came back negative if still going on need to been seen by GYN

## 2018-03-07 ENCOUNTER — Encounter: Admit: 2018-03-07 | Discharge: 2018-03-08 | Disposition: A | Discharge status: Home / Self Care | Payer: MEDICARE

## 2018-03-07 ENCOUNTER — Ambulatory Visit: Admit: 2018-03-07 | Discharge: 2018-03-08 | Disposition: A | Discharge status: Home / Self Care | Payer: MEDICARE

## 2018-03-07 DIAGNOSIS — M1990 Unspecified osteoarthritis, unspecified site: Secondary | ICD-10-CM | POA: Diagnosis not present

## 2018-03-07 DIAGNOSIS — I509 Heart failure, unspecified: Secondary | ICD-10-CM | POA: Diagnosis not present

## 2018-03-07 DIAGNOSIS — I110 Hypertensive heart disease with heart failure: Secondary | ICD-10-CM | POA: Diagnosis not present

## 2018-03-07 DIAGNOSIS — E785 Hyperlipidemia, unspecified: Secondary | ICD-10-CM | POA: Diagnosis not present

## 2018-03-07 DIAGNOSIS — R1011 Right upper quadrant pain: Secondary | ICD-10-CM | POA: Diagnosis not present

## 2018-03-07 DIAGNOSIS — R1031 Right lower quadrant pain: Secondary | ICD-10-CM | POA: Diagnosis not present

## 2018-03-07 DIAGNOSIS — E119 Type 2 diabetes mellitus without complications: Secondary | ICD-10-CM | POA: Diagnosis not present

## 2018-03-07 DIAGNOSIS — R110 Nausea: Secondary | ICD-10-CM | POA: Diagnosis not present

## 2018-03-07 DIAGNOSIS — Z87440 Personal history of urinary (tract) infections: Secondary | ICD-10-CM | POA: Diagnosis not present

## 2018-03-07 DIAGNOSIS — R9431 Abnormal electrocardiogram [ECG] [EKG]: Secondary | ICD-10-CM | POA: Diagnosis not present

## 2018-03-07 DIAGNOSIS — M069 Rheumatoid arthritis, unspecified: Secondary | ICD-10-CM | POA: Diagnosis not present

## 2018-03-07 DIAGNOSIS — M810 Age-related osteoporosis without current pathological fracture: Secondary | ICD-10-CM | POA: Diagnosis not present

## 2018-03-07 DIAGNOSIS — Z79899 Other long term (current) drug therapy: Secondary | ICD-10-CM | POA: Diagnosis not present

## 2018-03-07 DIAGNOSIS — Z87891 Personal history of nicotine dependence: Secondary | ICD-10-CM | POA: Diagnosis not present

## 2018-03-07 DIAGNOSIS — N12 Tubulo-interstitial nephritis, not specified as acute or chronic: Secondary | ICD-10-CM | POA: Diagnosis not present

## 2018-03-07 DIAGNOSIS — N760 Acute vaginitis: Secondary | ICD-10-CM | POA: Diagnosis not present

## 2018-03-07 DIAGNOSIS — Z7982 Long term (current) use of aspirin: Secondary | ICD-10-CM | POA: Diagnosis not present

## 2018-03-07 DIAGNOSIS — Z882 Allergy status to sulfonamides status: Secondary | ICD-10-CM | POA: Diagnosis not present

## 2018-03-07 DIAGNOSIS — N939 Abnormal uterine and vaginal bleeding, unspecified: Secondary | ICD-10-CM

## 2018-03-07 DIAGNOSIS — N898 Other specified noninflammatory disorders of vagina: Principal | ICD-10-CM

## 2018-03-07 MED ORDER — METRONIDAZOLE 0.75 % VAGINAL GEL
Freq: Two times a day (BID) | VAGINAL | 0 refills | 0 days | Status: CP
Start: 2018-03-07 — End: 2018-03-14

## 2018-03-07 MED ORDER — CIPROFLOXACIN 500 MG TABLET
ORAL_TABLET | Freq: Two times a day (BID) | ORAL | 0 refills | 0.00000 days | Status: CP
Start: 2018-03-07 — End: 2018-04-15

## 2018-03-07 MED ORDER — METRONIDAZOLE 500 MG TABLET
ORAL_TABLET | Freq: Two times a day (BID) | ORAL | 0 refills | 0.00000 days | Status: CP
Start: 2018-03-07 — End: 2018-03-07

## 2018-03-19 ENCOUNTER — Ambulatory Visit (INDEPENDENT_AMBULATORY_CARE_PROVIDER_SITE_OTHER): Payer: Medicare Other | Admitting: Nurse Practitioner

## 2018-03-19 ENCOUNTER — Encounter: Payer: Self-pay | Admitting: Nurse Practitioner

## 2018-03-19 VITALS — BP 118/72 | HR 88 | Resp 16 | Ht 64.0 in | Wt 244.0 lb

## 2018-03-19 DIAGNOSIS — R7303 Prediabetes: Secondary | ICD-10-CM

## 2018-03-19 DIAGNOSIS — M0579 Rheumatoid arthritis with rheumatoid factor of multiple sites without organ or systems involvement: Secondary | ICD-10-CM

## 2018-03-19 DIAGNOSIS — B370 Candidal stomatitis: Secondary | ICD-10-CM | POA: Diagnosis not present

## 2018-03-19 DIAGNOSIS — C9210 Chronic myeloid leukemia, BCR/ABL-positive, not having achieved remission: Secondary | ICD-10-CM | POA: Diagnosis not present

## 2018-03-19 DIAGNOSIS — N760 Acute vaginitis: Secondary | ICD-10-CM

## 2018-03-19 DIAGNOSIS — E1165 Type 2 diabetes mellitus with hyperglycemia: Secondary | ICD-10-CM

## 2018-03-19 DIAGNOSIS — M329 Systemic lupus erythematosus, unspecified: Secondary | ICD-10-CM

## 2018-03-19 DIAGNOSIS — B9689 Other specified bacterial agents as the cause of diseases classified elsewhere: Secondary | ICD-10-CM

## 2018-03-19 LAB — POCT GLYCOSYLATED HEMOGLOBIN (HGB A1C): Hemoglobin A1C: 6.5 % — AB (ref 4.0–5.6)

## 2018-03-19 NOTE — Progress Notes (Signed)
[facility] [place], [place] [address]  Internal MEDICINE  Office Visit Note  Patient Name: Laura Chen  [id number]  [id number]  Date of Service: 03/27/2018  Chief Complaint  Patient presents with  . Hypertension  . Diabetes  . Gastroesophageal Reflux    Diabetes  She presents for her follow-up diabetic visit. She has type 2 diabetes mellitus. No MedicAlert identification noted. Her disease course has been stable. Pertinent negatives for hypoglycemia include no dizziness, headaches or nervousness/anxiousness. Associated symptoms include fatigue and weakness. Pertinent negatives for diabetes include no chest pain, no polydipsia and no polyuria. There are no hypoglycemic complications. Symptoms are stable. There are no diabetic complications. Risk factors for coronary artery disease include hypertension and post-menopausal. Current diabetic treatment includes diet. She is compliant with treatment all of the time. Her weight is stable. She is following a generally healthy diet. Meal planning includes avoidance of concentrated sweets. She has not had a previous visit with a dietitian. She participates in exercise intermittently. There is no change in her home blood glucose trend. An ACE inhibitor/angiotensin II receptor blocker is not being taken. She does not see a podiatrist.Eye exam is not current.       Current Medication: Outpatient Encounter Medications as of 03/19/2018  Medication Sig  . ACCU-CHEK FASTCLIX LANCETS MISC USE TO TEST BLOOD SUGAR QD UTD  . aspirin EC 81 MG tablet Take 81 mg by mouth every evening.   Marland Kitchen azaTHIOprine (IMURAN) 50 MG tablet Take 50 mg by mouth daily.  . baclofen (LIORESAL) 10 MG tablet Take 10 mg by mouth 3 (three) times daily.  Marland Kitchen buPROPion (WELLBUTRIN SR) 100 MG 12 hr tablet Take 1 tablet (100 mg total) by mouth daily.  . cephALEXin (KEFLEX) 500 MG capsule Take 1 capsule (500 mg total) by mouth 3 (three) times daily.  .  cetirizine (ZYRTEC) 10 MG tablet Take 10 mg by mouth.  . cholecalciferol (VITAMIN D) 1000 units tablet Take 2,000 Units by mouth daily.  . DULoxetine (CYMBALTA) 20 MG capsule Take 1 capsule (20 mg total) by mouth daily.  . fluconazole (DIFLUCAN) 150 MG tablet Take 1 tablet po once weekly  . fluticasone (FLONASE) 50 MCG/ACT nasal spray USE 1 SPRAY IN BOTH  NOSTRILS DAILY.  Marland Kitchen glucose blood (ACCU-CHEK GUIDE) test strip Use to test blood sugar QD UTD, normal dx e11.65  . ketoconazole (NIZORAL) 2 % cream Apply 1 application topically daily.  Marland Kitchen linaclotide (LINZESS) 290 MCG CAPS capsule Take 1 capsule (290 mcg total) by mouth daily.  . metroNIDAZOLE (FLAGYL) 500 MG tablet Take 1 tablet (500 mg total) by mouth 2 (two) times daily.  . metroNIDAZOLE (METROGEL) 0.75 % vaginal gel Place 1 Applicatorful vaginally 2 (two) times daily.  Marland Kitchen nystatin (MYCOSTATIN) 100000 UNIT/ML suspension Take 5 mLs (500,000 Units total) by mouth 4 (four) times daily.  Marland Kitchen omeprazole (PRILOSEC) 20 MG capsule Take 1 capsule (20 mg total) by mouth daily.  . Oxycodone HCl 10 MG TABS Take 10 mg by mouth 3 (three) times daily as needed (for pain).  . phenazopyridine (PYRIDIUM) 200 MG tablet Take 1 tablet (200 mg total) by mouth 3 (three) times daily as needed for pain.  . ponatinib HCl (ICLUSIG) 15 MG tablet Take 15 mg by mouth daily.   . potassium chloride SA (K-DUR,KLOR-CON) 20 MEQ tablet Take 1 tablet (20 mEq total) by mouth 2 (two) times daily.  . predniSONE (DELTASONE) 5 MG tablet Take 5 mg by mouth daily with  breakfast.  . rosuvastatin (CRESTOR) 5 MG tablet TAKE 1 TABLET BY MOUTH  DAILY WITH SUPPER  . traMADol (ULTRAM) 50 MG tablet Take 2 tablets (100 mg total) by mouth 2 (two) times daily.  . [DISCONTINUED] nitrofurantoin, macrocrystal-monohydrate, (MACROBID) 100 MG capsule Take 1 capsule (100 mg total) by mouth 2 (two) times daily. (Patient not taking: Reported on 02/11/2018)   No facility-administered encounter medications on  file as of 03/19/2018.     Surgical History: Past Surgical History:  Procedure Laterality Date  . DEXA  2010   osteopenia  . MYOMECTOMY  2008  . right ankle AFO  2011  . TRANSTHORACIC ECHOCARDIOGRAM  02/6107   nl systolic/diastolic fxn, EF 60%, mild MR, normal PA pressures  . US/HSG  05/2010   possible endometrial polyp, rec rpt 8 wks continue loestrin 24 [facility])    Medical History: Past Medical History:  Diagnosis Date  . Asthma   . CML (chronic myeloid leukemia) ([place]) 2011   (Dr. Alyse Low) stopped gleevec 2/2 side effects  . Diabetes mellitus without complication ([place])   . GERD (gastroesophageal reflux disease)   . History of shingles   . History of syphilis 1990   s/p treatment  . Hypertension   . Leukemia ([place])   . Osteopenia    due to prednisone use, was on reclast  . PUD (peptic ulcer disease)   . Rheumatoid arthritis(714.0) 1990s   Jacoud's arthropathy, previously treated with MTX, TNFa (remicade, enbrel, humira, orencia, rituximab, and actemra)  . SLE (systemic lupus erythematosus) ([place]) 09/2009   Rheum-Dr. Joan Mayans  . Thyroid goiter    Endo-Dr. Eddie Dibbles    Family History: Family History  Problem Relation Age of Onset  . Heart attack Maternal Grandmother   . Coronary artery disease Maternal Grandmother   . Hypertension Mother   . Hypertension Other        Aunt    Social History   Socioeconomic History  . Marital status: Single    Spouse name: Not on file  . Number of children: Not on file  . Years of education: Not on file  . Highest education level: Not on file  Occupational History  . Occupation: Secretary/administrator at [employer]  . Financial resource strain: Not on file  . Food insecurity:    Worry: Not on file    Inability: Not on file  . Transportation needs:    Medical: Not on file    Non-medical: Not on file  Tobacco Use  . Smoking status: Former Smoker    Packs/day: 0.30    Types: Cigarettes  . Smokeless tobacco: Never  Used  . Tobacco comment: 6 cigarettes/day  Substance and Sexual Activity  . Alcohol use: No  . Drug use: No  . Sexual activity: Not on file  Lifestyle  . Physical activity:    Days per week: Not on file    Minutes per session: Not on file  . Stress: Not on file  Relationships  . Social connections:    Talks on phone: Not on file    Gets together: Not on file    Attends religious service: Not on file    Active member of club or organization: Not on file    Attends meetings of clubs or organizations: Not on file    Relationship status: Not on file  . Intimate partner violence:    Fear of current or ex partner: Not on file    Emotionally abused: Not on file  Physically abused: Not on file    Forced sexual activity: Not on file  Other Topics Concern  . Not on file  Social History Narrative   Caffeine: 1 cup/day coffee      Lives with mother, 1 dog      HS education      Review of Systems  Constitutional: Positive for fatigue. Negative for activity change, chills and fever.  HENT: Negative for congestion, rhinorrhea, sinus pressure and voice change.        Oral thrush  Respiratory: Negative for apnea, cough, chest tightness, shortness of breath and wheezing.   Cardiovascular: Negative for chest pain and palpitations.  Gastrointestinal: Negative for abdominal distention, abdominal pain, constipation, diarrhea, nausea and vomiting.  Endocrine: Negative for polydipsia and polyuria.       Blood sugars controlled through diet and exercise alone.   Genitourinary: Positive for dysuria, pelvic pain and vaginal discharge. Negative for decreased urine volume, difficulty urinating, flank pain, frequency, genital sores, hematuria, urgency, vaginal bleeding and vaginal pain.       Vaginal itching and irritation. Rash along the outer aspect of the labia. Patient to see GYN for further evaluation.   Musculoskeletal: Positive for arthralgias, gait problem and joint swelling. Negative for  back pain.  Skin: Negative for color change, rash and wound.  Allergic/Immunologic: Negative for environmental allergies.  Neurological: Positive for weakness. Negative for dizziness, facial asymmetry and headaches.  Hematological: Negative for adenopathy.  Psychiatric/Behavioral: Negative for dysphoric mood. The patient is not nervous/anxious.     Today's Vitals   03/19/18 1052  BP: 118/72  Pulse: 88  Resp: 16  SpO2: 97%  Weight: 244 lb (110.7 kg)  Height: 5\' 4"  (1.626 m)   Body mass index is 41.88 kg/m.  Physical Exam Vitals signs and nursing note reviewed.  Constitutional:      General: She is not in acute distress.    Appearance: Normal appearance. She is well-developed. She is not diaphoretic.  HENT:     Head: Normocephalic and atraumatic.     Nose: Nose normal.     Mouth/Throat:     Pharynx: No oropharyngeal exudate.  Eyes:     Conjunctiva/sclera: Conjunctivae normal.     Pupils: Pupils are equal, round, and reactive to light.  Neck:     Musculoskeletal: Normal range of motion and neck supple.     Thyroid: No thyromegaly.     Vascular: No carotid bruit or JVD.     Trachea: No tracheal deviation.  Cardiovascular:     Rate and Rhythm: Normal rate and regular rhythm.     Heart sounds: Normal heart sounds. No murmur. No friction rub. No gallop.   Pulmonary:     Effort: Pulmonary effort is normal. No respiratory distress.     Breath sounds: Normal breath sounds. No wheezing or rales.  Abdominal:     General: Bowel sounds are normal.     Palpations: Abdomen is soft.     Tenderness: There is no abdominal tenderness.  Musculoskeletal:     Comments: Generalized joint pain with point tenderness present.   Lymphadenopathy:     Cervical: No cervical adenopathy.  Skin:    Capillary Refill: Capillary refill takes less than 2 seconds.     Findings: No erythema or rash.  Neurological:     Mental Status: She is alert and oriented to person, place, and time.     Cranial  Nerves: No cranial nerve deficit.  Psychiatric:  Mood and Affect: Mood is anxious and depressed.        Speech: Speech normal.        Behavior: Behavior normal.        Thought Content: Thought content normal.        Judgment: Judgment normal.    Assessment/Plan: 1. Borderline diabetes - POCT HgB A1C 6.5 today. Managed through diet and exercise alone. Continue to monitor closely. - Microalbumin, urine  2. Oral candidiasis Continue to swishe and swallow with nystatin suspension up to four times daily.   3. Bacterial vaginitis Symptoms persistent with negative labwork. Patient to see GYN for further evaluation and treatment.  4. Chronic myeloid leukemia ([place]) Continue regular visits with oncology as scheduled.   5. Systemic lupus erythematosus, unspecified SLE type, unspecified organ involvement status [facility]) rheumatology visits as scheduled.   6. Rheumatoid arthritis involving multiple sites with positive rheumatoid factor [facility]) rheumatology visits as scheduled.   General Counseling: Amberlynn verbalizes understanding of the findings of todays visit and agrees with plan of treatment. I have discussed any further diagnostic evaluation that may be needed or ordered today. We also reviewed her medications today. she has been encouraged to call the office with any questions or concerns that should arise related to todays visit.  Diabetes Counseling:  1. Addition of ACE inh/ ARB'S for nephroprotection. Microalbumin is updated  2. Diabetic foot care, prevention of complications. Podiatry consult 3. Exercise and lose weight.  4. Diabetic eye examination, Diabetic eye exam is updated  5. Monitor blood sugar closlely. nutrition counseling.  6. Sign and symptoms of hypoglycemia including shaking sweating,confusion and headaches.  This patient was seen by Leretha Pol FNP Collaboration with Dr Lavera Guise as a part of collaborative care agreement  Orders Placed This Encounter   Procedures  . Microalbumin, urine  . POCT HgB A1C     Time spent: 25 Minutes      Dr Lavera Guise Internal medicine

## 2018-03-20 LAB — MICROALBUMIN, URINE: Microalbumin, Urine: 15.4 ug/mL

## 2018-03-22 ENCOUNTER — Encounter
Admit: 2018-03-22 | Discharge: 2018-03-23 | Payer: MEDICARE | Attending: Obstetrics & Gynecology | Primary: Obstetrics & Gynecology

## 2018-03-22 DIAGNOSIS — N898 Other specified noninflammatory disorders of vagina: Secondary | ICD-10-CM

## 2018-03-22 DIAGNOSIS — N939 Abnormal uterine and vaginal bleeding, unspecified: Principal | ICD-10-CM

## 2018-03-22 MED ORDER — OXYCODONE 10 MG TABLET
ORAL_TABLET | Freq: Three times a day (TID) | ORAL | 0 refills | 0.00000 days | Status: CP | PRN
Start: 2018-03-22 — End: 2018-04-29

## 2018-03-22 MED ORDER — CLINDAMYCIN HCL 300 MG CAPSULE
ORAL_CAPSULE | Freq: Two times a day (BID) | ORAL | 0 refills | 0 days | Status: CP
Start: 2018-03-22 — End: 2018-04-15

## 2018-04-15 ENCOUNTER — Encounter
Admit: 2018-04-15 | Discharge: 2018-04-16 | Payer: MEDICARE | Attending: Obstetrics & Gynecology | Primary: Obstetrics & Gynecology

## 2018-04-15 DIAGNOSIS — N3000 Acute cystitis without hematuria: Secondary | ICD-10-CM | POA: Diagnosis not present

## 2018-04-15 DIAGNOSIS — N939 Abnormal uterine and vaginal bleeding, unspecified: Principal | ICD-10-CM

## 2018-04-15 MED ORDER — NITROFURANTOIN MONOHYDRATE/MACROCRYSTALS 100 MG CAPSULE
ORAL_CAPSULE | Freq: Two times a day (BID) | ORAL | 0 refills | 0 days | Status: CP
Start: 2018-04-15 — End: 2018-05-02

## 2018-04-17 ENCOUNTER — Encounter: Admit: 2018-04-17 | Discharge: 2018-04-18 | Payer: MEDICARE

## 2018-04-17 ENCOUNTER — Encounter
Admit: 2018-04-17 | Discharge: 2018-04-18 | Payer: MEDICARE | Attending: Hematology & Oncology | Primary: Hematology & Oncology

## 2018-04-17 DIAGNOSIS — C9210 Chronic myeloid leukemia, BCR/ABL-positive, not having achieved remission: Secondary | ICD-10-CM | POA: Diagnosis not present

## 2018-04-17 MED ORDER — PONATINIB 15 MG TABLET
ORAL_TABLET | Freq: Every day | ORAL | 11 refills | 0 days | Status: CP
Start: 2018-04-17 — End: 2018-04-23

## 2018-04-23 DIAGNOSIS — K219 Gastro-esophageal reflux disease without esophagitis: Principal | ICD-10-CM

## 2018-04-23 DIAGNOSIS — I10 Essential (primary) hypertension: Principal | ICD-10-CM

## 2018-04-23 DIAGNOSIS — D689 Coagulation defect, unspecified: Principal | ICD-10-CM

## 2018-04-23 DIAGNOSIS — R011 Cardiac murmur, unspecified: Principal | ICD-10-CM

## 2018-04-23 DIAGNOSIS — I5030 Unspecified diastolic (congestive) heart failure: Principal | ICD-10-CM

## 2018-04-23 DIAGNOSIS — M069 Rheumatoid arthritis, unspecified: Principal | ICD-10-CM

## 2018-04-23 DIAGNOSIS — A539 Syphilis, unspecified: Principal | ICD-10-CM

## 2018-04-23 DIAGNOSIS — M329 Systemic lupus erythematosus, unspecified: Principal | ICD-10-CM

## 2018-04-23 DIAGNOSIS — M20029 Boutonniere deformity of unspecified finger(s): Principal | ICD-10-CM

## 2018-04-23 DIAGNOSIS — D649 Anemia, unspecified: Principal | ICD-10-CM

## 2018-04-23 DIAGNOSIS — F329 Major depressive disorder, single episode, unspecified: Principal | ICD-10-CM

## 2018-04-23 DIAGNOSIS — C9210 Chronic myeloid leukemia, BCR/ABL-positive, not having achieved remission: Principal | ICD-10-CM

## 2018-04-23 DIAGNOSIS — M1990 Unspecified osteoarthritis, unspecified site: Principal | ICD-10-CM

## 2018-04-23 DIAGNOSIS — M810 Age-related osteoporosis without current pathological fracture: Principal | ICD-10-CM

## 2018-04-23 DIAGNOSIS — H269 Unspecified cataract: Principal | ICD-10-CM

## 2018-04-23 DIAGNOSIS — I509 Heart failure, unspecified: Principal | ICD-10-CM

## 2018-04-23 DIAGNOSIS — E119 Type 2 diabetes mellitus without complications: Principal | ICD-10-CM

## 2018-04-23 DIAGNOSIS — Z789 Other specified health status: Principal | ICD-10-CM

## 2018-04-23 DIAGNOSIS — E669 Obesity, unspecified: Principal | ICD-10-CM

## 2018-04-23 DIAGNOSIS — E785 Hyperlipidemia, unspecified: Principal | ICD-10-CM

## 2018-04-23 DIAGNOSIS — Z9189 Other specified personal risk factors, not elsewhere classified: Principal | ICD-10-CM

## 2018-04-23 MED ORDER — PONATINIB 15 MG TABLET
ORAL_TABLET | Freq: Every day | ORAL | 11 refills | 0.00000 days | Status: CP
Start: 2018-04-23 — End: 2018-04-23

## 2018-04-23 MED ORDER — PONATINIB 15 MG TABLET: 30 mg | tablet | Freq: Every day | 11 refills | 0 days | Status: AC

## 2018-04-29 ENCOUNTER — Encounter
Admit: 2018-04-29 | Discharge: 2018-04-30 | Payer: MEDICARE | Attending: Obstetrics & Gynecology | Primary: Obstetrics & Gynecology

## 2018-04-29 DIAGNOSIS — M810 Age-related osteoporosis without current pathological fracture: Principal | ICD-10-CM

## 2018-04-29 DIAGNOSIS — M069 Rheumatoid arthritis, unspecified: Principal | ICD-10-CM

## 2018-04-29 DIAGNOSIS — M170 Bilateral primary osteoarthritis of knee: Principal | ICD-10-CM

## 2018-04-29 DIAGNOSIS — R011 Cardiac murmur, unspecified: Principal | ICD-10-CM

## 2018-04-29 DIAGNOSIS — M1990 Unspecified osteoarthritis, unspecified site: Principal | ICD-10-CM

## 2018-04-29 DIAGNOSIS — E785 Hyperlipidemia, unspecified: Principal | ICD-10-CM

## 2018-04-29 DIAGNOSIS — A539 Syphilis, unspecified: Principal | ICD-10-CM

## 2018-04-29 DIAGNOSIS — Z789 Other specified health status: Principal | ICD-10-CM

## 2018-04-29 DIAGNOSIS — I10 Essential (primary) hypertension: Principal | ICD-10-CM

## 2018-04-29 DIAGNOSIS — H269 Unspecified cataract: Principal | ICD-10-CM

## 2018-04-29 DIAGNOSIS — D689 Coagulation defect, unspecified: Principal | ICD-10-CM

## 2018-04-29 DIAGNOSIS — M329 Systemic lupus erythematosus, unspecified: Principal | ICD-10-CM

## 2018-04-29 DIAGNOSIS — I5030 Unspecified diastolic (congestive) heart failure: Principal | ICD-10-CM

## 2018-04-29 DIAGNOSIS — E669 Obesity, unspecified: Principal | ICD-10-CM

## 2018-04-29 DIAGNOSIS — F329 Major depressive disorder, single episode, unspecified: Principal | ICD-10-CM

## 2018-04-29 DIAGNOSIS — N939 Abnormal uterine and vaginal bleeding, unspecified: Principal | ICD-10-CM

## 2018-04-29 DIAGNOSIS — I509 Heart failure, unspecified: Principal | ICD-10-CM

## 2018-04-29 DIAGNOSIS — M20029 Boutonniere deformity of unspecified finger(s): Principal | ICD-10-CM

## 2018-04-29 DIAGNOSIS — E119 Type 2 diabetes mellitus without complications: Principal | ICD-10-CM

## 2018-04-29 DIAGNOSIS — Z9189 Other specified personal risk factors, not elsewhere classified: Principal | ICD-10-CM

## 2018-04-29 DIAGNOSIS — C9210 Chronic myeloid leukemia, BCR/ABL-positive, not having achieved remission: Principal | ICD-10-CM

## 2018-04-29 DIAGNOSIS — D649 Anemia, unspecified: Principal | ICD-10-CM

## 2018-04-29 DIAGNOSIS — K219 Gastro-esophageal reflux disease without esophagitis: Principal | ICD-10-CM

## 2018-04-29 MED ORDER — OXYCODONE 10 MG TABLET
ORAL_TABLET | Freq: Three times a day (TID) | ORAL | 0 refills | 0 days | Status: CP | PRN
Start: 2018-04-29 — End: 2018-05-30

## 2018-05-02 ENCOUNTER — Encounter: Admit: 2018-05-02 | Discharge: 2018-05-02 | Payer: MEDICARE | Attending: Anesthesiology | Primary: Anesthesiology

## 2018-05-02 ENCOUNTER — Encounter: Admit: 2018-05-02 | Discharge: 2018-05-02 | Payer: MEDICARE

## 2018-05-02 DIAGNOSIS — Z87891 Personal history of nicotine dependence: Secondary | ICD-10-CM | POA: Diagnosis not present

## 2018-05-02 DIAGNOSIS — M329 Systemic lupus erythematosus, unspecified: Secondary | ICD-10-CM | POA: Diagnosis not present

## 2018-05-02 DIAGNOSIS — C9210 Chronic myeloid leukemia, BCR/ABL-positive, not having achieved remission: Secondary | ICD-10-CM | POA: Diagnosis not present

## 2018-05-02 DIAGNOSIS — Z882 Allergy status to sulfonamides status: Secondary | ICD-10-CM | POA: Diagnosis not present

## 2018-05-02 DIAGNOSIS — M810 Age-related osteoporosis without current pathological fracture: Secondary | ICD-10-CM | POA: Diagnosis not present

## 2018-05-02 DIAGNOSIS — Z7982 Long term (current) use of aspirin: Secondary | ICD-10-CM | POA: Diagnosis not present

## 2018-05-02 DIAGNOSIS — K219 Gastro-esophageal reflux disease without esophagitis: Secondary | ICD-10-CM | POA: Diagnosis not present

## 2018-05-02 DIAGNOSIS — Z79891 Long term (current) use of opiate analgesic: Secondary | ICD-10-CM | POA: Diagnosis not present

## 2018-05-02 DIAGNOSIS — Z7952 Long term (current) use of systemic steroids: Secondary | ICD-10-CM | POA: Diagnosis not present

## 2018-05-02 DIAGNOSIS — M069 Rheumatoid arthritis, unspecified: Secondary | ICD-10-CM | POA: Diagnosis not present

## 2018-05-02 DIAGNOSIS — Z888 Allergy status to other drugs, medicaments and biological substances status: Secondary | ICD-10-CM | POA: Diagnosis not present

## 2018-05-02 DIAGNOSIS — Z79899 Other long term (current) drug therapy: Secondary | ICD-10-CM | POA: Diagnosis not present

## 2018-05-02 DIAGNOSIS — E785 Hyperlipidemia, unspecified: Secondary | ICD-10-CM | POA: Diagnosis not present

## 2018-05-02 DIAGNOSIS — I110 Hypertensive heart disease with heart failure: Secondary | ICD-10-CM | POA: Diagnosis not present

## 2018-05-02 DIAGNOSIS — N950 Postmenopausal bleeding: Secondary | ICD-10-CM | POA: Diagnosis not present

## 2018-05-02 DIAGNOSIS — M1990 Unspecified osteoarthritis, unspecified site: Secondary | ICD-10-CM | POA: Diagnosis not present

## 2018-05-02 DIAGNOSIS — I5032 Chronic diastolic (congestive) heart failure: Secondary | ICD-10-CM | POA: Diagnosis not present

## 2018-05-02 DIAGNOSIS — E669 Obesity, unspecified: Principal | ICD-10-CM

## 2018-05-02 DIAGNOSIS — D259 Leiomyoma of uterus, unspecified: Principal | ICD-10-CM

## 2018-05-02 DIAGNOSIS — N939 Abnormal uterine and vaginal bleeding, unspecified: Principal | ICD-10-CM

## 2018-05-02 DIAGNOSIS — Z6841 Body Mass Index (BMI) 40.0 and over, adult: Principal | ICD-10-CM

## 2018-05-02 DIAGNOSIS — F329 Major depressive disorder, single episode, unspecified: Principal | ICD-10-CM

## 2018-05-02 DIAGNOSIS — N858 Other specified noninflammatory disorders of uterus: Principal | ICD-10-CM

## 2018-05-02 DIAGNOSIS — Q505 Embryonic cyst of broad ligament: Principal | ICD-10-CM

## 2018-05-03 DIAGNOSIS — M069 Rheumatoid arthritis, unspecified: Principal | ICD-10-CM

## 2018-05-03 DIAGNOSIS — E669 Obesity, unspecified: Principal | ICD-10-CM

## 2018-05-03 DIAGNOSIS — D649 Anemia, unspecified: Principal | ICD-10-CM

## 2018-05-03 DIAGNOSIS — M1990 Unspecified osteoarthritis, unspecified site: Principal | ICD-10-CM

## 2018-05-03 DIAGNOSIS — Z789 Other specified health status: Principal | ICD-10-CM

## 2018-05-03 DIAGNOSIS — E785 Hyperlipidemia, unspecified: Principal | ICD-10-CM

## 2018-05-03 DIAGNOSIS — I5030 Unspecified diastolic (congestive) heart failure: Principal | ICD-10-CM

## 2018-05-03 DIAGNOSIS — Z9189 Other specified personal risk factors, not elsewhere classified: Principal | ICD-10-CM

## 2018-05-03 DIAGNOSIS — E119 Type 2 diabetes mellitus without complications: Principal | ICD-10-CM

## 2018-05-03 DIAGNOSIS — R011 Cardiac murmur, unspecified: Principal | ICD-10-CM

## 2018-05-03 DIAGNOSIS — K219 Gastro-esophageal reflux disease without esophagitis: Principal | ICD-10-CM

## 2018-05-03 DIAGNOSIS — M810 Age-related osteoporosis without current pathological fracture: Principal | ICD-10-CM

## 2018-05-03 DIAGNOSIS — C9210 Chronic myeloid leukemia, BCR/ABL-positive, not having achieved remission: Principal | ICD-10-CM

## 2018-05-03 DIAGNOSIS — H269 Unspecified cataract: Principal | ICD-10-CM

## 2018-05-03 DIAGNOSIS — M20029 Boutonniere deformity of unspecified finger(s): Principal | ICD-10-CM

## 2018-05-03 DIAGNOSIS — I509 Heart failure, unspecified: Principal | ICD-10-CM

## 2018-05-03 DIAGNOSIS — I10 Essential (primary) hypertension: Principal | ICD-10-CM

## 2018-05-03 DIAGNOSIS — M329 Systemic lupus erythematosus, unspecified: Principal | ICD-10-CM

## 2018-05-03 DIAGNOSIS — D689 Coagulation defect, unspecified: Principal | ICD-10-CM

## 2018-05-03 DIAGNOSIS — A539 Syphilis, unspecified: Principal | ICD-10-CM

## 2018-05-03 DIAGNOSIS — F329 Major depressive disorder, single episode, unspecified: Principal | ICD-10-CM

## 2018-05-14 DIAGNOSIS — B9689 Other specified bacterial agents as the cause of diseases classified elsewhere: Principal | ICD-10-CM

## 2018-05-14 DIAGNOSIS — N760 Acute vaginitis: Principal | ICD-10-CM

## 2018-05-14 MED ORDER — METRONIDAZOLE 500 MG TABLET
ORAL_TABLET | Freq: Two times a day (BID) | ORAL | 0 refills | 0.00000 days | Status: CP
Start: 2018-05-14 — End: 2018-05-19

## 2018-05-20 ENCOUNTER — Telehealth: Payer: Self-pay

## 2018-05-20 NOTE — Telephone Encounter (Signed)
Let's go ahead and set her up for phone visit to discuss. She has not been here since 02/2018 and she has had multiple tests from GYN.

## 2018-05-21 ENCOUNTER — Ambulatory Visit (INDEPENDENT_AMBULATORY_CARE_PROVIDER_SITE_OTHER): Payer: Medicare Other | Admitting: Nurse Practitioner

## 2018-05-21 ENCOUNTER — Encounter: Payer: Self-pay | Admitting: Nurse Practitioner

## 2018-05-21 ENCOUNTER — Other Ambulatory Visit: Payer: Self-pay

## 2018-05-21 VITALS — BP 146/81 | HR 75 | Temp 97.6°F | Resp 16 | Ht 64.0 in | Wt 244.0 lb

## 2018-05-21 DIAGNOSIS — N390 Urinary tract infection, site not specified: Secondary | ICD-10-CM | POA: Diagnosis not present

## 2018-05-21 DIAGNOSIS — B3731 Acute candidiasis of vulva and vagina: Secondary | ICD-10-CM

## 2018-05-21 DIAGNOSIS — B373 Candidiasis of vulva and vagina: Secondary | ICD-10-CM | POA: Diagnosis not present

## 2018-05-21 DIAGNOSIS — R319 Hematuria, unspecified: Secondary | ICD-10-CM

## 2018-05-21 MED ORDER — AMOXICILLIN-POT CLAVULANATE 875-125 MG PO TABS: 1.0000 | ORAL_TABLET | Freq: Two times a day (BID) | ORAL | 0 refills | Status: DC

## 2018-05-21 NOTE — Progress Notes (Signed)
[facility] [place], [place] [address]  Internal MEDICINE  Telephone Visit  Patient Name: Laura Chen  [id number]  [id number]  Date of Service: 05/21/2018  I connected with the patient at [facility] by telephone and verified the patients identity using two identifiers.   I discussed the limitations, risks, security and privacy concerns of performing an evaluation and management service by telephone and the availability of in person appointments. I also discussed with the patient that there may be a patient responsible charge related to the service.  The patient expressed understanding and agrees to proceed.    Chief Complaint  Patient presents with  . Follow-up    Gyn visit     The patient has been contacted via telephone for sick visit due to concerns for spread of novel coronavirus. The patient continues to have issues with urinary tract infection, vaginal discharge, and vagianl itching and irritation. She was treated several times with antibiotics and was eventually seen by gyn due to persistent symptoms. She had endometrial biopsy which was negative and any evidence of abnormalities or evidence of infection. She was put on another round of macrobid which did not help at all. She is concerned about a possible kidney stone or problem with the kidney causing these infections. A renal ultrasound was ordered back in December but the patient did not have copay to have this done at the time.       Current Medication: Outpatient Encounter Medications as of 05/21/2018  Medication Sig  . ACCU-CHEK FASTCLIX LANCETS MISC USE TO TEST BLOOD SUGAR QD UTD  . amoxicillin-clavulanate (AUGMENTIN) 875-125 MG tablet Take 1 tablet by mouth 2 (two) times daily.  Marland Kitchen aspirin EC 81 MG tablet Take 81 mg by mouth every evening.   Marland Kitchen azaTHIOprine (IMURAN) 50 MG tablet Take 50 mg by mouth daily.  . baclofen (LIORESAL) 10 MG tablet Take 10 mg by mouth 3 (three) times daily.  Marland Kitchen buPROPion  (WELLBUTRIN SR) 100 MG 12 hr tablet Take 1 tablet (100 mg total) by mouth daily.  . cephALEXin (KEFLEX) 500 MG capsule Take 1 capsule (500 mg total) by mouth 3 (three) times daily.  . cetirizine (ZYRTEC) 10 MG tablet Take 10 mg by mouth.  . cholecalciferol (VITAMIN D) 1000 units tablet Take 2,000 Units by mouth daily.  . DULoxetine (CYMBALTA) 20 MG capsule Take 1 capsule (20 mg total) by mouth daily.  . fluconazole (DIFLUCAN) 150 MG tablet Take 1 tablet po once weekly  . fluticasone (FLONASE) 50 MCG/ACT nasal spray USE 1 SPRAY IN BOTH  NOSTRILS DAILY.  Marland Kitchen glucose blood (ACCU-CHEK GUIDE) test strip Use to test blood sugar QD UTD, normal dx e11.65  . ketoconazole (NIZORAL) 2 % cream Apply 1 application topically daily.  Marland Kitchen linaclotide (LINZESS) 290 MCG CAPS capsule Take 1 capsule (290 mcg total) by mouth daily.  . metroNIDAZOLE (FLAGYL) 500 MG tablet Take 1 tablet (500 mg total) by mouth 2 (two) times daily.  . metroNIDAZOLE (METROGEL) 0.75 % vaginal gel Place 1 Applicatorful vaginally 2 (two) times daily.  Marland Kitchen nystatin (MYCOSTATIN) 100000 UNIT/ML suspension Take 5 mLs (500,000 Units total) by mouth 4 (four) times daily.  Marland Kitchen omeprazole (PRILOSEC) 20 MG capsule Take 1 capsule (20 mg total) by mouth daily.  . Oxycodone HCl 10 MG TABS Take 10 mg by mouth 3 (three) times daily as needed (for pain).  . phenazopyridine (PYRIDIUM) 200 MG tablet Take 1 tablet (200 mg total) by mouth 3 (three) times daily  as needed for pain.  . ponatinib HCl (ICLUSIG) 15 MG tablet Take 15 mg by mouth daily.   . potassium chloride SA (K-DUR,KLOR-CON) 20 MEQ tablet Take 1 tablet (20 mEq total) by mouth 2 (two) times daily.  . predniSONE (DELTASONE) 5 MG tablet Take 5 mg by mouth daily with breakfast.  . rosuvastatin (CRESTOR) 5 MG tablet TAKE 1 TABLET BY MOUTH  DAILY WITH SUPPER  . traMADol (ULTRAM) 50 MG tablet Take 2 tablets (100 mg total) by mouth 2 (two) times daily.   No facility-administered encounter medications on file  as of 05/21/2018.     Surgical History: Past Surgical History:  Procedure Laterality Date  . DEXA  2010   osteopenia  . MYOMECTOMY  2008  . right ankle AFO  2011  . TRANSTHORACIC ECHOCARDIOGRAM  05/6268   nl systolic/diastolic fxn, EF 35%, mild MR, normal PA pressures  . US/HSG  05/2010   possible endometrial polyp, rec rpt 8 wks continue loestrin 24 [facility])    Medical History: Past Medical History:  Diagnosis Date  . Asthma   . CML (chronic myeloid leukemia) ([place]) 2011   (Dr. Alyse Low) stopped gleevec 2/2 side effects  . Diabetes mellitus without complication ([place])   . GERD (gastroesophageal reflux disease)   . History of shingles   . History of syphilis 1990   s/p treatment  . Hypertension   . Leukemia ([place])   . Osteopenia    due to prednisone use, was on reclast  . PUD (peptic ulcer disease)   . Rheumatoid arthritis(714.0) 1990s   Jacoud's arthropathy, previously treated with MTX, TNFa (remicade, enbrel, humira, orencia, rituximab, and actemra)  . SLE (systemic lupus erythematosus) ([place]) 09/2009   Rheum-Dr. Joan Mayans  . Thyroid goiter    Endo-Dr. Eddie Dibbles    Family History: Family History  Problem Relation Age of Onset  . Heart attack Maternal Grandmother   . Coronary artery disease Maternal Grandmother   . Hypertension Mother   . Hypertension Other        Aunt    Social History   Socioeconomic History  . Marital status: Single    Spouse name: Not on file  . Number of children: Not on file  . Years of education: Not on file  . Highest education level: Not on file  Occupational History  . Occupation: Secretary/administrator at [employer]  . Financial resource strain: Not on file  . Food insecurity:    Worry: Not on file    Inability: Not on file  . Transportation needs:    Medical: Not on file    Non-medical: Not on file  Tobacco Use  . Smoking status: Former Smoker    Packs/day: 0.30    Types: Cigarettes  . Smokeless tobacco: Never Used   . Tobacco comment: 6 cigarettes/day  Substance and Sexual Activity  . Alcohol use: No  . Drug use: No  . Sexual activity: Not on file  Lifestyle  . Physical activity:    Days per week: Not on file    Minutes per session: Not on file  . Stress: Not on file  Relationships  . Social connections:    Talks on phone: Not on file    Gets together: Not on file    Attends religious service: Not on file    Active member of club or organization: Not on file    Attends meetings of clubs or organizations: Not on file    Relationship status: Not  on file  . Intimate partner violence:    Fear of current or ex partner: Not on file    Emotionally abused: Not on file    Physically abused: Not on file    Forced sexual activity: Not on file  Other Topics Concern  . Not on file  Social History Narrative   Caffeine: 1 cup/day coffee      Lives with mother, 1 dog      HS education      Review of Systems  Constitutional: Negative for activity change, chills, fatigue and fever.  HENT: Negative for congestion, rhinorrhea, sinus pressure and voice change.        Oral thrush  Respiratory: Negative for apnea, cough, chest tightness, shortness of breath and wheezing.   Cardiovascular: Negative for chest pain, palpitations and leg swelling.  Gastrointestinal: Negative for abdominal distention, abdominal pain, constipation, diarrhea, nausea and vomiting.  Endocrine: Negative for polydipsia and polyuria.  Genitourinary: Positive for dysuria, pelvic pain and vaginal discharge. Negative for decreased urine volume, difficulty urinating, flank pain, frequency, genital sores, hematuria, urgency, vaginal bleeding and vaginal pain.       Vaginal itching and irritation. Rash along the outer aspect of the labia.   Musculoskeletal: Positive for arthralgias, gait problem and joint swelling. Negative for back pain.  Skin: Negative for color change, rash and wound.  Allergic/Immunologic: Negative for environmental  allergies.  Neurological: Positive for weakness. Negative for dizziness, facial asymmetry and headaches.  Hematological: Negative for adenopathy.  Psychiatric/Behavioral: Negative for dysphoric mood. The patient is not nervous/anxious.     Today's Vitals   05/21/18 1615  BP: (!) 146/81  Pulse: 75  Resp: 16  Temp: 97.6 F (36.4 C)  Weight: 244 lb (110.7 kg)  Height: 5\' 4"  (1.626 m)   Body mass index is 41.88 kg/m.  Assessment/Plan:  1. Urinary tract infection with hematuria, site unspecified Started augmentin 875mg  bid for 10 days. Increase fluid intake - amoxicillin-clavulanate (AUGMENTIN) 875-125 MG tablet; Take 1 tablet by mouth 2 (two) times daily.  Dispense: 20 tablet; Refill: 0  2. Hematuria, unspecified type Will get renal ultrasound rescheduled for further evaluation.   3. Vaginal candidiasis Recommended she use diflucan and previously prescribed nystatin cream as needed and as prescribed .  General Counseling: Nilam verbalizes understanding of the findings of today's phone visit and agrees with plan of treatment. I have discussed any further diagnostic evaluation that may be needed or ordered today. We also reviewed her medications today. she has been encouraged to call the office with any questions or concerns that should arise related to todays visit.  This patient was seen by [place] with Dr Lavera Guise as a part of collaborative care agreement  Meds ordered this encounter  Medications  . amoxicillin-clavulanate (AUGMENTIN) 875-125 MG tablet    Sig: Take 1 tablet by mouth 2 (two) times daily.    Dispense:  20 tablet    Refill:  0    Order Specific Question:   Supervising Provider    Answer:   Lavera Guise [[id number]]    Time spent: 56 Minutes    Dr Lavera Guise Internal medicine

## 2018-05-21 NOTE — Telephone Encounter (Signed)
Pt advised we can discuss in detailed in telephone visit

## 2018-05-22 NOTE — Addendum Note (Signed)
Addended by: Leretha Pol on: 05/22/2018 05:33 PM   Modules accepted: Level of Service

## 2018-05-29 ENCOUNTER — Telehealth: Payer: Self-pay | Admitting: Nurse Practitioner

## 2018-05-29 DIAGNOSIS — F321 Major depressive disorder, single episode, moderate: Secondary | ICD-10-CM

## 2018-05-29 DIAGNOSIS — M0579 Rheumatoid arthritis with rheumatoid factor of multiple sites without organ or systems involvement: Secondary | ICD-10-CM

## 2018-05-29 MED ORDER — TRAMADOL HCL 50 MG PO TABS: 100.0000 mg | ORAL_TABLET | Freq: Two times a day (BID) | ORAL | 1 refills | Status: DC | PRN

## 2018-05-29 MED ORDER — BUPROPION HCL ER (SR) 100 MG PO TB12: 100.0000 mg | ORAL_TABLET | Freq: Every day | ORAL | 3 refills | Status: DC

## 2018-05-30 ENCOUNTER — Other Ambulatory Visit: Payer: Self-pay | Admitting: Nurse Practitioner

## 2018-05-30 DIAGNOSIS — M0579 Rheumatoid arthritis with rheumatoid factor of multiple sites without organ or systems involvement: Secondary | ICD-10-CM

## 2018-05-30 MED ORDER — TRAMADOL HCL 50 MG PO TABS: 100.0000 mg | ORAL_TABLET | Freq: Two times a day (BID) | ORAL | 1 refills | Status: DC | PRN

## 2018-05-30 MED ORDER — OXYCODONE 10 MG TABLET
ORAL_TABLET | Freq: Three times a day (TID) | ORAL | 0 refills | 0 days | Status: CP | PRN
Start: 2018-05-30 — End: 2018-07-01

## 2018-05-30 NOTE — Progress Notes (Signed)
Refilled tramadol per request and sent to [facility] for her.

## 2018-06-06 ENCOUNTER — Other Ambulatory Visit: Payer: Self-pay | Admitting: Internal Medicine

## 2018-06-06 DIAGNOSIS — E1165 Type 2 diabetes mellitus with hyperglycemia: Secondary | ICD-10-CM

## 2018-06-10 ENCOUNTER — Encounter: Admit: 2018-06-10 | Discharge: 2018-06-11 | Payer: MEDICARE

## 2018-06-10 DIAGNOSIS — C9210 Chronic myeloid leukemia, BCR/ABL-positive, not having achieved remission: Secondary | ICD-10-CM | POA: Diagnosis not present

## 2018-06-10 DIAGNOSIS — M059 Rheumatoid arthritis with rheumatoid factor, unspecified: Secondary | ICD-10-CM | POA: Diagnosis not present

## 2018-06-10 DIAGNOSIS — E876 Hypokalemia: Secondary | ICD-10-CM | POA: Diagnosis not present

## 2018-06-12 ENCOUNTER — Encounter
Admit: 2018-06-12 | Discharge: 2018-06-13 | Payer: MEDICARE | Attending: Hematology & Oncology | Primary: Hematology & Oncology

## 2018-06-12 DIAGNOSIS — D509 Iron deficiency anemia, unspecified: Secondary | ICD-10-CM | POA: Diagnosis not present

## 2018-06-12 DIAGNOSIS — C9210 Chronic myeloid leukemia, BCR/ABL-positive, not having achieved remission: Secondary | ICD-10-CM | POA: Diagnosis not present

## 2018-06-14 ENCOUNTER — Ambulatory Visit: Payer: Self-pay | Admitting: Nurse Practitioner

## 2018-06-16 DIAGNOSIS — D509 Iron deficiency anemia, unspecified: Secondary | ICD-10-CM | POA: Insufficient documentation

## 2018-06-24 ENCOUNTER — Encounter: Admit: 2018-06-24 | Discharge: 2018-06-25 | Payer: MEDICARE | Attending: Rheumatology | Primary: Rheumatology

## 2018-06-24 DIAGNOSIS — C9310 Chronic myelomonocytic leukemia not having achieved remission: Secondary | ICD-10-CM | POA: Diagnosis not present

## 2018-06-24 DIAGNOSIS — M329 Systemic lupus erythematosus, unspecified: Secondary | ICD-10-CM | POA: Diagnosis not present

## 2018-06-24 DIAGNOSIS — M069 Rheumatoid arthritis, unspecified: Secondary | ICD-10-CM | POA: Diagnosis not present

## 2018-06-24 DIAGNOSIS — M1990 Unspecified osteoarthritis, unspecified site: Secondary | ICD-10-CM | POA: Diagnosis not present

## 2018-07-01 ENCOUNTER — Ambulatory Visit (INDEPENDENT_AMBULATORY_CARE_PROVIDER_SITE_OTHER): Payer: Medicare Other | Admitting: Nurse Practitioner

## 2018-07-01 ENCOUNTER — Other Ambulatory Visit: Payer: Self-pay

## 2018-07-01 ENCOUNTER — Encounter: Payer: Self-pay | Admitting: Nurse Practitioner

## 2018-07-01 VITALS — BP 138/74 | HR 93 | Resp 16 | Ht 64.0 in | Wt 246.6 lb

## 2018-07-01 DIAGNOSIS — N390 Urinary tract infection, site not specified: Secondary | ICD-10-CM

## 2018-07-01 DIAGNOSIS — R319 Hematuria, unspecified: Secondary | ICD-10-CM

## 2018-07-01 DIAGNOSIS — R300 Dysuria: Secondary | ICD-10-CM

## 2018-07-01 LAB — POCT URINALYSIS DIPSTICK
Bilirubin, UA: NEGATIVE
Glucose, UA: NEGATIVE
Ketones, UA: NEGATIVE
Nitrite, UA: NEGATIVE
Protein, UA: NEGATIVE
Spec Grav, UA: 1.015 (ref 1.010–1.025)
Urobilinogen, UA: 0.2 E.U./dL
pH, UA: 6 (ref 5.0–8.0)

## 2018-07-01 MED ORDER — AMOXICILLIN-POT CLAVULANATE 875-125 MG PO TABS: 1.0000 | ORAL_TABLET | Freq: Two times a day (BID) | ORAL | 0 refills | Status: DC

## 2018-07-01 MED ORDER — PHENAZOPYRIDINE HCL 200 MG PO TABS: 200.0000 mg | ORAL_TABLET | Freq: Three times a day (TID) | ORAL | 0 refills | Status: DC | PRN

## 2018-07-01 MED ORDER — OXYCODONE 10 MG TABLET
ORAL_TABLET | Freq: Three times a day (TID) | ORAL | 0 refills | 0 days | Status: CP | PRN
Start: 2018-07-01 — End: 2018-08-07

## 2018-07-01 NOTE — Progress Notes (Signed)
[facility] [place], [place] [address]  Internal MEDICINE  Office Visit Note  Patient Name: Laura Chen  [id number]  [id number]  Date of Service: 07/17/2018    Pt is here for a sick visit.  Chief Complaint  Patient presents with  . Urinary Tract Infection    feeling the urgency but only a little coming out  . Vaginal Itching    been going on for about 10 months, no change in detergents or body soaps  . Vaginal Discharge    thin greenish tint discharge,   . Pain    no burning, some lower back and abdominal pains, and near groin area     The patient is c/o lower back pain which radiates down into the groin. Has been going on for some time. Has been treated multiple times for UTI and bacterial vaginosis. She is scheduled for renal ultrasound this Friday for further evaluation of stones. She die she urologist/gynecologist, and was told there was no evidence of infection. She is having vaginal discharge. There is mild odor to it. Of all treatments tried, augmentin worked the best in clearing up symptoms of UTI and bacterial vaginosis. She denies itching or irritation in vaginal area. She denies burning when she urinates. But does have pelvic and flank pain when she urinates.       Current Medication:  Outpatient Encounter Medications as of 07/01/2018  Medication Sig  . ACCU-CHEK FASTCLIX LANCETS MISC USE TO TEST BLOOD SUGAR QD UTD  . ACCU-CHEK GUIDE test strip USE TO CHECK BLOOD GLUCOSE  EVERY DAY AS DIRECTED  . aspirin EC 81 MG tablet Take 81 mg by mouth every evening.   Marland Kitchen azaTHIOprine (IMURAN) 50 MG tablet Take 50 mg by mouth daily.  . baclofen (LIORESAL) 10 MG tablet Take 10 mg by mouth 3 (three) times daily.  Marland Kitchen buPROPion (WELLBUTRIN SR) 100 MG 12 hr tablet Take 1 tablet (100 mg total) by mouth daily.  . cephALEXin (KEFLEX) 500 MG capsule Take 1 capsule (500 mg total) by mouth 3 (three) times daily.  . cetirizine (ZYRTEC) 10 MG tablet Take 10 mg  by mouth.  . cholecalciferol (VITAMIN D) 1000 units tablet Take 2,000 Units by mouth daily.  . DULoxetine (CYMBALTA) 20 MG capsule Take 1 capsule (20 mg total) by mouth daily.  . fluconazole (DIFLUCAN) 150 MG tablet Take 1 tablet po once weekly  . fluticasone (FLONASE) 50 MCG/ACT nasal spray USE 1 SPRAY IN BOTH  NOSTRILS DAILY.  Marland Kitchen ketoconazole (NIZORAL) 2 % cream Apply 1 application topically daily.  Marland Kitchen LINZESS 290 MCG CAPS capsule TAKE 1 CAPSULE BY MOUTH  DAILY  . metroNIDAZOLE (METROGEL) 0.75 % vaginal gel Place 1 Applicatorful vaginally 2 (two) times daily.  Marland Kitchen nystatin (MYCOSTATIN) 100000 UNIT/ML suspension Take 5 mLs (500,000 Units total) by mouth 4 (four) times daily.  Marland Kitchen omeprazole (PRILOSEC) 20 MG capsule Take 1 capsule (20 mg total) by mouth daily.  . Oxycodone HCl 10 MG TABS Take 10 mg by mouth 3 (three) times daily as needed (for pain).  . phenazopyridine (PYRIDIUM) 200 MG tablet Take 1 tablet (200 mg total) by mouth 3 (three) times daily as needed for pain.  . ponatinib HCl (ICLUSIG) 15 MG tablet Take 15 mg by mouth daily.   . potassium chloride SA (K-DUR,KLOR-CON) 20 MEQ tablet Take 1 tablet (20 mEq total) by mouth 2 (two) times daily.  . predniSONE (DELTASONE) 5 MG tablet Take 5 mg by mouth daily with  breakfast.  . rosuvastatin (CRESTOR) 5 MG tablet TAKE 1 TABLET BY MOUTH  DAILY WITH SUPPER  . traMADol (ULTRAM) 50 MG tablet Take 2 tablets (100 mg total) by mouth every 12 (twelve) hours as needed.  . [DISCONTINUED] phenazopyridine (PYRIDIUM) 200 MG tablet Take 1 tablet (200 mg total) by mouth 3 (three) times daily as needed for pain.  Marland Kitchen amoxicillin-clavulanate (AUGMENTIN) 875-125 MG tablet Take 1 tablet by mouth 2 (two) times daily.  . [DISCONTINUED] amoxicillin-clavulanate (AUGMENTIN) 875-125 MG tablet Take 1 tablet by mouth 2 (two) times daily. (Patient not taking: Reported on 07/01/2018)  . [DISCONTINUED] metroNIDAZOLE (FLAGYL) 500 MG tablet Take 1 tablet (500 mg total) by mouth 2  (two) times daily. (Patient not taking: Reported on 07/01/2018)   No facility-administered encounter medications on file as of 07/01/2018.       Medical History: Past Medical History:  Diagnosis Date  . Asthma   . CML (chronic myeloid leukemia) ([place]) 2011   (Dr. Alyse Low) stopped gleevec 2/2 side effects  . Diabetes mellitus without complication ([place])   . GERD (gastroesophageal reflux disease)   . History of shingles   . History of syphilis 1990   s/p treatment  . Hypertension   . Leukemia ([place])   . Osteopenia    due to prednisone use, was on reclast  . PUD (peptic ulcer disease)   . Rheumatoid arthritis(714.0) 1990s   Jacoud's arthropathy, previously treated with MTX, TNFa (remicade, enbrel, humira, orencia, rituximab, and actemra)  . SLE (systemic lupus erythematosus) ([place]) 09/2009   Rheum-Dr. Joan Mayans  . Thyroid goiter    Endo-Dr. Eddie Dibbles    Today's Vitals   07/01/18 1509  BP: 138/74  Pulse: 93  Resp: 16  SpO2: 99%  Weight: 246 lb 9.6 oz (111.9 kg)  Height: 5\' 4"  (1.626 m)   Body mass index is 42.33 kg/m.  Review of Systems  Constitutional: Negative for activity change, chills, fatigue and fever.  HENT: Negative for congestion, rhinorrhea, sinus pressure and voice change.        Oral thrush  Respiratory: Negative for apnea, cough, chest tightness, shortness of breath and wheezing.   Cardiovascular: Negative for chest pain, palpitations and leg swelling.  Gastrointestinal: Negative for abdominal distention, abdominal pain, constipation, diarrhea, nausea and vomiting.  Endocrine: Negative for polydipsia and polyuria.  Genitourinary: Positive for dysuria, hematuria, pelvic pain and vaginal discharge. Negative for decreased urine volume, difficulty urinating, flank pain, frequency, genital sores, urgency, vaginal bleeding and vaginal pain.       Vaginal itching and irritation. Rash along the outer aspect of the labia.   Musculoskeletal: Positive for arthralgias,  gait problem and joint swelling. Negative for back pain.  Skin: Negative for color change, rash and wound.  Allergic/Immunologic: Negative for environmental allergies.  Neurological: Positive for weakness. Negative for dizziness, facial asymmetry and headaches.  Hematological: Negative for adenopathy.  Psychiatric/Behavioral: Negative for dysphoric mood. The patient is not nervous/anxious.     Physical Exam Vitals signs and nursing note reviewed.  Constitutional:      General: She is not in acute distress.    Appearance: Normal appearance. She is well-developed. She is not diaphoretic.  HENT:     Head: Normocephalic and atraumatic.     Nose: Nose normal.     Mouth/Throat:     Pharynx: No oropharyngeal exudate.  Eyes:     Conjunctiva/sclera: Conjunctivae normal.     Pupils: Pupils are equal, round, and reactive to light.  Neck:  Musculoskeletal: Normal range of motion and neck supple.     Thyroid: No thyromegaly.     Vascular: No carotid bruit or JVD.     Trachea: No tracheal deviation.  Cardiovascular:     Rate and Rhythm: Normal rate and regular rhythm.     Heart sounds: Normal heart sounds. No murmur. No friction rub. No gallop.   Pulmonary:     Effort: Pulmonary effort is normal. No respiratory distress.     Breath sounds: Normal breath sounds. No wheezing or rales.  Abdominal:     General: Bowel sounds are normal.     Palpations: Abdomen is soft.     Tenderness: There is no abdominal tenderness.  Genitourinary:    Comments: Urine sample positive for large WBC and moderate blood.  Musculoskeletal:     Comments: Generalized joint pain with point tenderness present.   Lymphadenopathy:     Cervical: No cervical adenopathy.  Skin:    Capillary Refill: Capillary refill takes less than 2 seconds.     Findings: No erythema or rash.  Neurological:     Mental Status: She is alert and oriented to person, place, and time.     Cranial Nerves: No cranial nerve deficit.   Psychiatric:        Mood and Affect: Mood is anxious and depressed.        Speech: Speech normal.        Behavior: Behavior normal.        Thought Content: Thought content normal.        Judgment: Judgment normal.   Assessment/Plan: 1. Urinary tract infection with hematuria, site unspecified Start augmentin 875mg  bid for 10 days. Send urine for culture and sensitivity and adjust antibiotics as indicated.  - amoxicillin-clavulanate (AUGMENTIN) 875-125 MG tablet; Take 1 tablet by mouth 2 (two) times daily.  Dispense: 42 tablet; Refill: 0 - CULTURE, URINE COMPREHENSIVE  2. Hematuria, unspecified type Patient scheduled for ultrasound of kidneys and bladder for further evaluation. Will discuss reslts with patient when available.   3. Dysuria Pyridium may be taken up to three times daily if needed for bladder pain.  - POCT Urinalysis Dipstick - phenazopyridine (PYRIDIUM) 200 MG tablet; Take 1 tablet (200 mg total) by mouth 3 (three) times daily as needed for pain.  Dispense: 10 tablet; Refill: 0  General Counseling: Jamilyn verbalizes understanding of the findings of todays visit and agrees with plan of treatment. I have discussed any further diagnostic evaluation that may be needed or ordered today. We also reviewed her medications today. she has been encouraged to call the office with any questions or concerns that should arise related to todays visit.    Counseling:  This patient was seen by Leretha Pol FNP Collaboration with Dr Lavera Guise as a part of collaborative care agreement  Orders Placed This Encounter  Procedures  . CULTURE, URINE COMPREHENSIVE  . POCT Urinalysis Dipstick    Meds ordered this encounter  Medications  . amoxicillin-clavulanate (AUGMENTIN) 875-125 MG tablet    Sig: Take 1 tablet by mouth 2 (two) times daily.    Dispense:  42 tablet    Refill:  0    Order Specific Question:   Supervising Provider    Answer:   Lavera Guise [[id number]]  . phenazopyridine  (PYRIDIUM) 200 MG tablet    Sig: Take 1 tablet (200 mg total) by mouth 3 (three) times daily as needed for pain.    Dispense:  10 tablet  Refill:  0    Order Specific Question:   Supervising Provider    Answer:   Lavera Guise [[id number]]    Time spent: 25 Minutes

## 2018-07-02 ENCOUNTER — Encounter: Admit: 2018-07-02 | Discharge: 2018-07-03 | Payer: MEDICARE

## 2018-07-02 DIAGNOSIS — N3090 Cystitis, unspecified without hematuria: Secondary | ICD-10-CM | POA: Diagnosis not present

## 2018-07-02 DIAGNOSIS — R3129 Other microscopic hematuria: Secondary | ICD-10-CM | POA: Diagnosis not present

## 2018-07-04 LAB — CULTURE, URINE COMPREHENSIVE

## 2018-07-05 ENCOUNTER — Other Ambulatory Visit: Payer: Self-pay

## 2018-07-05 ENCOUNTER — Ambulatory Visit: Payer: Medicare Other

## 2018-07-05 DIAGNOSIS — R319 Hematuria, unspecified: Secondary | ICD-10-CM

## 2018-07-11 ENCOUNTER — Ambulatory Visit (INDEPENDENT_AMBULATORY_CARE_PROVIDER_SITE_OTHER): Payer: Medicare Other | Admitting: Nurse Practitioner

## 2018-07-11 ENCOUNTER — Encounter: Payer: Self-pay | Admitting: Nurse Practitioner

## 2018-07-11 ENCOUNTER — Other Ambulatory Visit: Payer: Self-pay

## 2018-07-11 VITALS — BP 147/77 | HR 95 | Ht 64.0 in | Wt 246.0 lb

## 2018-07-11 DIAGNOSIS — B373 Candidiasis of vulva and vagina: Secondary | ICD-10-CM

## 2018-07-11 DIAGNOSIS — R319 Hematuria, unspecified: Secondary | ICD-10-CM

## 2018-07-11 DIAGNOSIS — B3731 Acute candidiasis of vulva and vagina: Secondary | ICD-10-CM

## 2018-07-11 DIAGNOSIS — D414 Neoplasm of uncertain behavior of bladder: Secondary | ICD-10-CM | POA: Diagnosis not present

## 2018-07-11 NOTE — Progress Notes (Signed)
[facility] [place],  [address]  Internal MEDICINE  Telephone Visit  Patient Name: Laura Chen  [id number]  [id number]  Date of Service: 07/12/2018  I connected with the patient at 4:35pm by webcam and verified the patients identity using two identifiers.   I discussed the limitations, risks, security and privacy concerns of performing an evaluation and management service by telephone and the availability of in person appointments. I also discussed with the patient that there may be a patient responsible charge related to the service.  The patient expressed understanding and agrees to proceed.    Chief Complaint  Patient presents with  . Telephone Screen    VIDEO VISIT [phone]  . Telephone Assessment  . Medical Management of Chronic Issues    3 month follow up  . Diabetes  . Labs Only    review ultrasound results  . Hypertension  . Gastroesophageal Reflux    .The patient has been contacted via webcam for follow up visit due to concerns for spread of novel coronavirus.  She has had ultrasound of the kidneys and bladder due to persistent UTI/vaginal infections, which have been persistent despite multiple rounds of antibiotics and anti-fungal therapies. She did have referral to urogyn amidst these infections and was basically told that everything looked fine. Ultrasound sowed normal kidneys. Also showed a soft tissue mass in the bladder, measuring 5.2X3cm in diameter. She is already set up to see urologist on 07/19/2018 for further evaluation and treatment.       Current Medication: Outpatient Encounter Medications as of 07/11/2018  Medication Sig  . ACCU-CHEK FASTCLIX LANCETS MISC USE TO TEST BLOOD SUGAR QD UTD  . ACCU-CHEK GUIDE test strip USE TO CHECK BLOOD GLUCOSE  EVERY DAY AS DIRECTED  . amoxicillin-clavulanate (AUGMENTIN) 875-125 MG tablet Take 1 tablet by mouth 2 (two) times daily.  Marland Kitchen aspirin EC 81 MG tablet Take 81 mg by mouth every  evening.   Marland Kitchen azaTHIOprine (IMURAN) 50 MG tablet Take 50 mg by mouth daily.  . baclofen (LIORESAL) 10 MG tablet Take 10 mg by mouth 3 (three) times daily.  Marland Kitchen buPROPion (WELLBUTRIN SR) 100 MG 12 hr tablet Take 1 tablet (100 mg total) by mouth daily.  . cephALEXin (KEFLEX) 500 MG capsule Take 1 capsule (500 mg total) by mouth 3 (three) times daily.  . cetirizine (ZYRTEC) 10 MG tablet Take 10 mg by mouth.  . cholecalciferol (VITAMIN D) 1000 units tablet Take 2,000 Units by mouth daily.  . DULoxetine (CYMBALTA) 20 MG capsule Take 1 capsule (20 mg total) by mouth daily.  . fluconazole (DIFLUCAN) 150 MG tablet Take 1 tablet po once weekly  . fluticasone (FLONASE) 50 MCG/ACT nasal spray USE 1 SPRAY IN BOTH  NOSTRILS DAILY.  Marland Kitchen ketoconazole (NIZORAL) 2 % cream Apply 1 application topically daily.  Marland Kitchen LINZESS 290 MCG CAPS capsule TAKE 1 CAPSULE BY MOUTH  DAILY  . metroNIDAZOLE (METROGEL) 0.75 % vaginal gel Place 1 Applicatorful vaginally 2 (two) times daily.  Marland Kitchen nystatin (MYCOSTATIN) 100000 UNIT/ML suspension Take 5 mLs (500,000 Units total) by mouth 4 (four) times daily.  Marland Kitchen omeprazole (PRILOSEC) 20 MG capsule Take 1 capsule (20 mg total) by mouth daily.  . Oxycodone HCl 10 MG TABS Take 10 mg by mouth 3 (three) times daily as needed (for pain).  . phenazopyridine (PYRIDIUM) 200 MG tablet Take 1 tablet (200 mg total) by mouth 3 (three) times daily as needed for pain.  . ponatinib HCl (ICLUSIG) 15  MG tablet Take 15 mg by mouth daily.   . potassium chloride SA (K-DUR,KLOR-CON) 20 MEQ tablet Take 1 tablet (20 mEq total) by mouth 2 (two) times daily.  . predniSONE (DELTASONE) 5 MG tablet Take 5 mg by mouth daily with breakfast.  . rosuvastatin (CRESTOR) 5 MG tablet TAKE 1 TABLET BY MOUTH  DAILY WITH SUPPER  . traMADol (ULTRAM) 50 MG tablet Take 2 tablets (100 mg total) by mouth every 12 (twelve) hours as needed.   No facility-administered encounter medications on file as of 07/11/2018.     Surgical  History: Past Surgical History:  Procedure Laterality Date  . DEXA  2010   osteopenia  . MYOMECTOMY  2008  . right ankle AFO  2011  . TRANSTHORACIC ECHOCARDIOGRAM  03/7780   nl systolic/diastolic fxn, EF 42%, mild MR, normal PA pressures  . US/HSG  05/2010   possible endometrial polyp, rec rpt 8 wks continue loestrin 24 [facility])    Medical History: Past Medical History:  Diagnosis Date  . Asthma   . CML (chronic myeloid leukemia) ([place]) 2011   (Dr. Alyse Low) stopped gleevec 2/2 side effects  . Diabetes mellitus without complication ([place])   . GERD (gastroesophageal reflux disease)   . History of shingles   . History of syphilis 1990   s/p treatment  . Hypertension   . Leukemia ([place])   . Osteopenia    due to prednisone use, was on reclast  . PUD (peptic ulcer disease)   . Rheumatoid arthritis(714.0) 1990s   Jacoud's arthropathy, previously treated with MTX, TNFa (remicade, enbrel, humira, orencia, rituximab, and actemra)  . SLE (systemic lupus erythematosus) ([place]) 09/2009   Rheum-Dr. Joan Mayans  . Thyroid goiter    Endo-Dr. Eddie Dibbles    Family History: Family History  Problem Relation Age of Onset  . Heart attack Maternal Grandmother   . Coronary artery disease Maternal Grandmother   . Hypertension Mother   . Hypertension Other        Aunt    Social History   Socioeconomic History  . Marital status: Single    Spouse name: Not on file  . Number of children: Not on file  . Years of education: Not on file  . Highest education level: Not on file  Occupational History  . Occupation: Secretary/administrator at [employer]  . Financial resource strain: Not on file  . Food insecurity:    Worry: Not on file    Inability: Not on file  . Transportation needs:    Medical: Not on file    Non-medical: Not on file  Tobacco Use  . Smoking status: Former Smoker    Packs/day: 0.30    Types: Cigarettes  . Smokeless tobacco: Never Used  . Tobacco comment: 6  cigarettes/day  Substance and Sexual Activity  . Alcohol use: No  . Drug use: No  . Sexual activity: Not on file  Lifestyle  . Physical activity:    Days per week: Not on file    Minutes per session: Not on file  . Stress: Not on file  Relationships  . Social connections:    Talks on phone: Not on file    Gets together: Not on file    Attends religious service: Not on file    Active member of club or organization: Not on file    Attends meetings of clubs or organizations: Not on file    Relationship status: Not on file  . Intimate partner violence:  Fear of current or ex partner: Not on file    Emotionally abused: Not on file    Physically abused: Not on file    Forced sexual activity: Not on file  Other Topics Concern  . Not on file  Social History Narrative   Caffeine: 1 cup/day coffee      Lives with mother, 1 dog      HS education      Review of Systems  Constitutional: Negative for activity change, chills, fatigue and fever.  HENT: Negative for congestion, rhinorrhea, sinus pressure and voice change.   Respiratory: Negative for apnea, cough, chest tightness, shortness of breath and wheezing.   Cardiovascular: Negative for chest pain, palpitations and leg swelling.  Gastrointestinal: Negative for abdominal distention, abdominal pain, constipation, diarrhea, nausea and vomiting.  Endocrine: Negative for polydipsia and polyuria.  Genitourinary: Positive for dysuria, pelvic pain and vaginal discharge. Negative for decreased urine volume, difficulty urinating, flank pain, frequency, genital sores, hematuria, urgency, vaginal bleeding and vaginal pain.       Vaginal itching and irritation. Yeast infection development after treatment with multiple rounds antibiotics .  Musculoskeletal: Positive for arthralgias, gait problem and joint swelling. Negative for back pain.  Skin: Negative for color change, rash and wound.  Allergic/Immunologic: Negative for environmental  allergies.  Neurological: Positive for weakness. Negative for dizziness, facial asymmetry and headaches.  Hematological: Negative for adenopathy.  Psychiatric/Behavioral: Negative for dysphoric mood. The patient is not nervous/anxious.    Today's Vitals   07/11/18 1610  BP: (!) 147/77  Pulse: 95  Weight: 246 lb (111.6 kg)  Height: 5\' 4"  (1.626 m)   Body mass index is 42.23 kg/m.  Observation/Objective:   The patient is alert and oriented. She is pleasant and answers all questions appropriately. Breathing is non-labored. She is in no acute distress at this time.    Assessment/Plan: 1. Neoplasm of uncertain behavior of bladder Reviewed ultrasound of the bladder with the patient. Showing soft tissue mass in the bladder measuring 5.2X3cm in diameter. Patient has appointment with urology on 07/19/2018.   2. Hematuria, unspecified type Likely from soft tissue mass. Urology appointment as scheduled for further evaluation.   3. Vaginal candidiasis Diflucan as prescribed   General Counseling: Laura Chen verbalizes understanding of the findings of today's phone visit and agrees with plan of treatment. I have discussed any further diagnostic evaluation that may be needed or ordered today. We also reviewed her medications today. she has been encouraged to call the office with any questions or concerns that should arise related to todays visit.   This patient was seen by Leretha Pol FNP Collaboration with Dr Lavera Guise as a part of collaborative care agreement  Time spent: 66 Minutes    Dr Lavera Guise Internal medicine

## 2018-07-12 DIAGNOSIS — D414 Neoplasm of uncertain behavior of bladder: Secondary | ICD-10-CM | POA: Insufficient documentation

## 2018-07-19 ENCOUNTER — Encounter: Admit: 2018-07-19 | Discharge: 2018-07-19 | Payer: MEDICARE

## 2018-07-19 ENCOUNTER — Encounter: Admit: 2018-07-19 | Discharge: 2018-07-19 | Payer: MEDICARE | Attending: Urology | Primary: Urology

## 2018-07-19 DIAGNOSIS — Z87891 Personal history of nicotine dependence: Secondary | ICD-10-CM | POA: Diagnosis not present

## 2018-07-19 DIAGNOSIS — R3129 Other microscopic hematuria: Secondary | ICD-10-CM | POA: Diagnosis not present

## 2018-07-25 ENCOUNTER — Encounter: Admit: 2018-07-25 | Discharge: 2018-07-26 | Payer: MEDICARE

## 2018-07-25 DIAGNOSIS — M059 Rheumatoid arthritis with rheumatoid factor, unspecified: Secondary | ICD-10-CM | POA: Diagnosis not present

## 2018-07-25 DIAGNOSIS — D509 Iron deficiency anemia, unspecified: Secondary | ICD-10-CM | POA: Diagnosis not present

## 2018-07-26 ENCOUNTER — Telehealth: Payer: Self-pay | Admitting: Oncology

## 2018-07-28 NOTE — Progress Notes (Signed)
[place]  [phone] [phone]) [phone]  ID: Laura Chen OB: [birth year]  MR#: [id number]  JJK#:[id number]  Patient Care Team: Lavera Guise, MD as PCP - General (Internal Medicine) Lavera Guise, MD (Internal Medicine) Christene Lye, MD (General Surgery)  I connected with Laura Chen on 07/30/18 at  2:15 PM EDT by video enabled telemedicine visit and verified that I am speaking with the correct person using two identifiers.   I discussed the limitations, risks, security and privacy concerns of performing an evaluation and management service by telemedicine and the availability of in-person appointments. I also discussed with the patient that there may be a patient responsible charge related to this service. The patient expressed understanding and agreed to proceed.   Other persons participating in the visit and their role in the encounter: Patient, MD  Patient's location: Home Provider's location: Clinic  CHIEF COMPLAINT: CML  INTERVAL HISTORY: Patient agreed to video enabled telemedicine visit for her routine 27-month follow-up.  She continues to take ponatinib 30 mg daily and is tolerating this well.  She has had recurrent UTIs recently and has been evaluated by both gynecology and urology without distinct etiology.  She otherwise feels well.  She has no neurologic complaints. She denies any fevers, night sweats, or weight loss.  She has chronic pain from her arthritis. She denies any chest pain, cough, hemoptysis, or shortness of breath. She has a fair appetite. She has no nausea, vomiting, constipation, or diarrhea.  She has no urinary complaints.  Patient otherwise feels well and offers no further specific complaints today.  REVIEW OF SYSTEMS:   Review of Systems  Constitutional: Negative.  Negative for fever, malaise/fatigue and weight loss.  Respiratory: Negative.  Negative for cough and shortness of breath.   Cardiovascular:  Negative.  Negative for chest pain and leg swelling.  Gastrointestinal: Negative.  Negative for abdominal pain and constipation.  Genitourinary: Positive for dysuria, flank pain and hematuria.  Musculoskeletal: Positive for back pain, joint pain and neck pain.  Skin: Negative.  Negative for rash.  Neurological: Negative.  Negative for sensory change, focal weakness, weakness and headaches.  Psychiatric/Behavioral: Negative.  The patient is not nervous/anxious.     As per HPI. Otherwise, a complete review of systems is negative.  PAST MEDICAL HISTORY: Past Medical History:  Diagnosis Date  . Asthma   . CML (chronic myeloid leukemia) ([place]) 2011   (Dr. Alyse Low) stopped gleevec 2/2 side effects  . Diabetes mellitus without complication ([place])   . GERD (gastroesophageal reflux disease)   . History of shingles   . History of syphilis 1990   s/p treatment  . Hypertension   . Leukemia ([place])   . Osteopenia    due to prednisone use, was on reclast  . PUD (peptic ulcer disease)   . Rheumatoid arthritis(714.0) 1990s   Jacoud's arthropathy, previously treated with MTX, TNFa (remicade, enbrel, humira, orencia, rituximab, and actemra)  . SLE (systemic lupus erythematosus) ([place]) 09/2009   Rheum-Dr. Joan Mayans  . Thyroid goiter    Endo-Dr. Eddie Dibbles    PAST SURGICAL HISTORY: Past Surgical History:  Procedure Laterality Date  . DEXA  2010   osteopenia  . MYOMECTOMY  2008  . right ankle AFO  2011  . TRANSTHORACIC ECHOCARDIOGRAM  04/7167   nl systolic/diastolic fxn, EF 67%, mild MR, normal PA pressures  . US/HSG  05/2010   possible endometrial polyp, rec rpt 8 wks continue loestrin 24 [facility])  FAMILY HISTORY Family History  Problem Relation Age of Onset  . Heart attack Maternal Grandmother   . Coronary artery disease Maternal Grandmother   . Hypertension Mother   . Hypertension Other        Aunt       ADVANCED DIRECTIVES:    HEALTH MAINTENANCE: Social History    Tobacco Use  . Smoking status: Former Smoker    Packs/day: 0.30    Types: Cigarettes  . Smokeless tobacco: Never Used  . Tobacco comment: 6 cigarettes/day  Substance Use Topics  . Alcohol use: No  . Drug use: No    Allergies  Allergen Reactions  . Furosemide     rash  . Ibuprofen Swelling  . Sulfa Antibiotics Rash and Other (See Comments)    Reaction:  Burning of feet   . Tape Itching and Rash  . Thiazide-Type Diuretics Rash    Current Outpatient Medications  Medication Sig Dispense Refill  . ACCU-CHEK FASTCLIX LANCETS MISC USE TO TEST BLOOD SUGAR QD UTD 90 each 3  . ACCU-CHEK GUIDE test strip USE TO CHECK BLOOD GLUCOSE  EVERY DAY AS DIRECTED 100 each 1  . amoxicillin-clavulanate (AUGMENTIN) 875-125 MG tablet Take 1 tablet by mouth 2 (two) times daily. 42 tablet 0  . aspirin EC 81 MG tablet Take 81 mg by mouth every evening.     Marland Kitchen azaTHIOprine (IMURAN) 50 MG tablet Take 50 mg by mouth daily.    . baclofen (LIORESAL) 10 MG tablet Take 10 mg by mouth 3 (three) times daily.    Marland Kitchen buPROPion (WELLBUTRIN SR) 100 MG 12 hr tablet Take 1 tablet (100 mg total) by mouth daily. 30 tablet 3  . cephALEXin (KEFLEX) 500 MG capsule Take 1 capsule (500 mg total) by mouth 3 (three) times daily. 30 capsule 0  . cetirizine (ZYRTEC) 10 MG tablet Take 10 mg by mouth.    . cholecalciferol (VITAMIN D) 1000 units tablet Take 2,000 Units by mouth daily.    . DULoxetine (CYMBALTA) 20 MG capsule Take 1 capsule (20 mg total) by mouth daily. 90 capsule 3  . fluconazole (DIFLUCAN) 150 MG tablet Take 1 tablet po once weekly 4 tablet 5  . fluticasone (FLONASE) 50 MCG/ACT nasal spray USE 1 SPRAY IN BOTH  NOSTRILS DAILY. 32 g 11  . ketoconazole (NIZORAL) 2 % cream Apply 1 application topically daily. 60 g 2  . LINZESS 290 MCG CAPS capsule TAKE 1 CAPSULE BY MOUTH  DAILY 90 capsule 2  . metroNIDAZOLE (METROGEL) 0.75 % vaginal gel Place 1 Applicatorful vaginally 2 (two) times daily. 70 g 0  . nystatin (MYCOSTATIN)  100000 UNIT/ML suspension Take 5 mLs (500,000 Units total) by mouth 4 (four) times daily. 200 mL 2  . omeprazole (PRILOSEC) 20 MG capsule Take 1 capsule (20 mg total) by mouth daily. 90 capsule 3  . Oxycodone HCl 10 MG TABS Take 10 mg by mouth 3 (three) times daily as needed (for pain).    . phenazopyridine (PYRIDIUM) 200 MG tablet Take 1 tablet (200 mg total) by mouth 3 (three) times daily as needed for pain. 10 tablet 0  . ponatinib HCl (ICLUSIG) 15 MG tablet Take 15 mg by mouth daily.     . potassium chloride SA (K-DUR,KLOR-CON) 20 MEQ tablet Take 1 tablet (20 mEq total) by mouth 2 (two) times daily. 450 tablet 2  . predniSONE (DELTASONE) 5 MG tablet Take 5 mg by mouth daily with breakfast.    . rosuvastatin (CRESTOR) 5 MG  tablet TAKE 1 TABLET BY MOUTH  DAILY WITH SUPPER 90 tablet 3  . traMADol (ULTRAM) 50 MG tablet Take 2 tablets (100 mg total) by mouth every 12 (twelve) hours as needed. 120 tablet 1   No current facility-administered medications for this visit.     OBJECTIVE: There were no vitals filed for this visit.   There is no height or weight on file to calculate BMI.    ECOG FS:1 - Symptomatic but completely ambulatory  General: Well-developed, well-nourished, no acute distress. HEENT: Normocephalic. Neuro: Alert, answering all questions appropriately. Cranial nerves grossly intact. Skin: No rashes or petechiae noted. Psych: Normal affect.   LAB RESULTS:  Lab Results  Component Value Date   NA 140 01/22/2018   K 4.0 01/22/2018   CL 108 01/22/2018   CO2 24 01/22/2018   GLUCOSE 145 (H) 01/22/2018   BUN 11 01/22/2018   CREATININE 1.30 (H) 01/22/2018   CALCIUM 8.5 (L) 01/22/2018   PROT 6.5 01/22/2018   ALBUMIN 3.7 01/22/2018   AST 46 (H) 01/22/2018   ALT 76 (H) 01/22/2018   ALKPHOS 199 (H) 01/22/2018   BILITOT 0.3 01/22/2018   GFRNONAA 48 (L) 01/22/2018   GFRAA 55 (L) 01/22/2018    Lab Results  Component Value Date   WBC 9.2 01/22/2018   NEUTROABS 6.0 01/22/2018    HGB 12.5 01/22/2018   HCT 42.7 01/22/2018   MCV 76.7 (L) 01/22/2018   PLT 265 01/22/2018     STUDIES: No results found.  ASSESSMENT: CML  PLAN:    1.  CML:  Previously, patient could not tolerate Gleevec, Sprycel, Tasigna, and Bosulif.  Patient's dose of ponatinib has been increased to 30 mg daily and she is tolerating this well.  Her most recent BCR-ABL1 p210 transcripts at [facility] detected a blood level of 0.067% this is essentially unchanged from February 2020 where her transcript level was 0.053%.  No further interventions are needed at this time.  Continue her routine follow-up with [facility].  Return to clinic in 6 months for further evaluation.   2.  Rheumatoid arthritis: Chronic and unchanged.  Continue 2 infusions of Rituxan every 6 months per [facility] rheumatology. 3.  History of cavitary lung lesions: Rheumatologic nodules, treatment as above. 4.  Cystitis: Continue follow-up with urology as indicated.  Recent CT scan at [facility] did not reveal distinct etiology. 5.  Iron deficiency: Patient has recently received IV Feraheme at [facility].  I provided 25 minutes of face-to-face video visit time during this encounter, and > 50% was spent counseling as documented under my assessment & plan.   Patient expressed understanding and was in agreement with this plan. She also understands that She can call clinic at any time with any questions, concerns, or complaints.     Lloyd Huger, MD   07/30/2018 7:05 AM

## 2018-07-29 ENCOUNTER — Inpatient Hospital Stay: Payer: Medicare Other | Admitting: Oncology

## 2018-07-29 ENCOUNTER — Telehealth: Payer: Self-pay

## 2018-07-29 ENCOUNTER — Inpatient Hospital Stay: Payer: Medicare Other | Attending: Oncology | Admitting: Oncology

## 2018-07-29 ENCOUNTER — Inpatient Hospital Stay: Payer: Medicare Other

## 2018-07-29 DIAGNOSIS — N3091 Cystitis, unspecified with hematuria: Secondary | ICD-10-CM

## 2018-07-29 DIAGNOSIS — C9210 Chronic myeloid leukemia, BCR/ABL-positive, not having achieved remission: Secondary | ICD-10-CM | POA: Diagnosis not present

## 2018-07-29 DIAGNOSIS — D509 Iron deficiency anemia, unspecified: Secondary | ICD-10-CM

## 2018-07-29 NOTE — Telephone Encounter (Signed)
Pt has consistent vaginal odor, pt has had a check up with urologist and her urine showed no bacteria only trace of blood. Spoke with dr. Humphrey Rolls and advised pt to insert boric acid weekly and to stop sexual intercourse for 2 months.

## 2018-07-31 ENCOUNTER — Telehealth: Payer: Self-pay

## 2018-07-31 ENCOUNTER — Ambulatory Visit: Admit: 2018-07-31 | Discharge: 2018-08-01 | Payer: MEDICARE

## 2018-07-31 DIAGNOSIS — M059 Rheumatoid arthritis with rheumatoid factor, unspecified: Secondary | ICD-10-CM | POA: Diagnosis not present

## 2018-07-31 DIAGNOSIS — M069 Rheumatoid arthritis, unspecified: Secondary | ICD-10-CM

## 2018-08-01 ENCOUNTER — Other Ambulatory Visit: Payer: Self-pay | Admitting: Nurse Practitioner

## 2018-08-01 DIAGNOSIS — M0579 Rheumatoid arthritis with rheumatoid factor of multiple sites without organ or systems involvement: Secondary | ICD-10-CM

## 2018-08-01 MED ORDER — TRAMADOL HCL 50 MG PO TABS: 100.0000 mg | ORAL_TABLET | Freq: Two times a day (BID) | ORAL | 1 refills | Status: DC | PRN

## 2018-08-01 NOTE — Progress Notes (Signed)
Approved and sent new prescription for her tramadol to the pharmacy.

## 2018-08-01 NOTE — Telephone Encounter (Signed)
Approved and sent new prescription for her tramadol to the pharmacy.

## 2018-08-01 NOTE — Telephone Encounter (Signed)
Pt advised we send med  

## 2018-08-06 ENCOUNTER — Encounter
Admit: 2018-08-06 | Discharge: 2018-08-07 | Payer: MEDICARE | Attending: Obstetrics & Gynecology | Primary: Obstetrics & Gynecology

## 2018-08-06 DIAGNOSIS — N939 Abnormal uterine and vaginal bleeding, unspecified: Principal | ICD-10-CM

## 2018-08-07 ENCOUNTER — Other Ambulatory Visit: Payer: Self-pay | Admitting: Internal Medicine

## 2018-08-07 MED ORDER — OXYCODONE 10 MG TABLET
ORAL_TABLET | Freq: Three times a day (TID) | ORAL | 0 refills | 0 days | Status: CP | PRN
Start: 2018-08-07 — End: 2018-10-02

## 2018-08-13 ENCOUNTER — Other Ambulatory Visit: Payer: Self-pay | Admitting: *Deleted

## 2018-08-13 ENCOUNTER — Institutional Professional Consult (permissible substitution)
Admit: 2018-08-13 | Discharge: 2018-08-14 | Payer: MEDICARE | Attending: Cardiovascular Disease | Primary: Cardiovascular Disease

## 2018-08-13 DIAGNOSIS — I5030 Unspecified diastolic (congestive) heart failure: Secondary | ICD-10-CM | POA: Diagnosis not present

## 2018-08-13 DIAGNOSIS — I10 Essential (primary) hypertension: Secondary | ICD-10-CM | POA: Diagnosis not present

## 2018-08-13 DIAGNOSIS — M069 Rheumatoid arthritis, unspecified: Secondary | ICD-10-CM | POA: Diagnosis not present

## 2018-08-13 DIAGNOSIS — C9210 Chronic myeloid leukemia, BCR/ABL-positive, not having achieved remission: Secondary | ICD-10-CM | POA: Diagnosis not present

## 2018-08-13 NOTE — Patient Outreach (Signed)
[place] [facility]) Care Management  08/13/2018  Laura Chen [birth year] [id number]   Telephone Screen  Referral Date:  08/09/2018 Referral Source:  [facility] High Risk List Reason for Referral:  Assess Needs Insurance:  [employer]   Outreach Attempt:  Outreach attempt #1 to patient for [facility] High Risk Screening. No answer. RN Health Coach left HIPAA compliant voicemail message along with contact information.  Plan:  RN Health Coach will send unsuccessful outreach letter to patient.  RN Health Coach will make another outreach attempt to patient within 3-4 business days if no return call back from patient.   [place] [phone] [email or web address]

## 2018-08-15 ENCOUNTER — Other Ambulatory Visit: Payer: Self-pay | Admitting: *Deleted

## 2018-08-15 ENCOUNTER — Encounter: Admit: 2018-08-15 | Discharge: 2018-08-16 | Payer: MEDICARE

## 2018-08-15 DIAGNOSIS — M059 Rheumatoid arthritis with rheumatoid factor, unspecified: Secondary | ICD-10-CM | POA: Diagnosis not present

## 2018-08-15 DIAGNOSIS — M069 Rheumatoid arthritis, unspecified: Secondary | ICD-10-CM

## 2018-08-15 NOTE — Patient Outreach (Signed)
[place] [facility]) Care Management  08/15/2018  Laura Chen 08/26/1965 [id number]   Telephone Screen  Referral Date:  08/09/2018 Referral Source:  [facility] High Risk List Reason for Referral:  Assess Needs Insurance:  [employer]   Outreach Attempt:  Outreach attempt #2 to patient for [facility] High Risk Screening. No answer. RN Health Coach left HIPAA compliant voicemail message along with contact information.  Plan:  RN Health Coach will make another outreach attempt to patient within 3-4 business days if no return call back from patient  [place] [phone] [name]

## 2018-08-19 ENCOUNTER — Other Ambulatory Visit: Payer: Self-pay | Admitting: *Deleted

## 2018-08-19 NOTE — Patient Outreach (Signed)
[place] [facility]) Care Management  08/19/2018  JINAN BIGGINS July 30, 1965 [id number]   Telephone Screen  Referral Date:08/09/2018 Referral Source:[employer] High Risk List Reason for Referral:Assess Needs Insurance:[employer] Medicare   Outreach Attempt:  Successful telephone outreach to patient to introduce [facility] services as part of [facility] insurance plan to assist with medical needs, education, and social needs, at no cost to the patient.  HIPAA verified with patient.  RN Health Coach introduced self, role and [facility] services.  Patient completed telephone screening.  Social:  Patient lives at home with her mother.  Reports being independent with ADLs and mother assisting with IADLs, as patient states she tires easily.  Mother transports her to her medical appointments.  Ambulates with walker and reports 1 fall in the past year without injuries.  DME in the home include:  Straight cane, Rolator walker, CBG meter, blood pressure cuff, scale, and a grab bar in the shower.  Conditions:   Per chart review and discussion with patient, PMH include but not limited to:  Arthritis, bilateral cataract extraction, congestive heart failure, chronic myeloid leukemia, clotting disorder, depression, diabetes, reflux, hypertension, hyperlipidemia, lupus, and rheumatoid arthritis.  Patient denies any recent emergency room visits or hospital admissions.  Reports checking her blood pressure about 3-4 times a week and blood pressure being in normal range.  Checks blood sugars about twice a week with fasting ranges of 80-90's.  Last Hgb A1C was 6.5 on 03/19/2018.  Medications:  Patient reports taking about 13 medications.  Manages medications herself without difficulties using pill box at times.  Denies any issues with affording medications at this time.  Appointments:  Attended Telehealth appointment with primary care provider on 07/11/2018.  Advanced Directives:  Denies having an advance directive  in place and does not wish to create one at this time.   Consent:  [facility] services reviewed and discussed with patient.  Patient verbally agrees to [place].  Plan: RN Health Coach will send patient Health Coach letter. RN Health Coach will make next telephone outreach to patient with in the month of July to complete initial telephone assessment.  [place] [phone] [email or web address]

## 2018-08-22 ENCOUNTER — Encounter
Admit: 2018-08-22 | Discharge: 2018-08-23 | Payer: MEDICARE | Attending: Obstetrics & Gynecology | Primary: Obstetrics & Gynecology

## 2018-08-22 DIAGNOSIS — N761 Subacute and chronic vaginitis: Secondary | ICD-10-CM | POA: Diagnosis not present

## 2018-08-22 MED ORDER — HYDROCORTISONE ACETATE 25 MG RECTAL SUPPOSITORY
Freq: Two times a day (BID) | RECTAL | 0 refills | 0 days | Status: CP
Start: 2018-08-22 — End: 2018-09-21

## 2018-09-02 ENCOUNTER — Other Ambulatory Visit: Payer: Self-pay | Admitting: *Deleted

## 2018-09-02 NOTE — Patient Outreach (Signed)
[place] [facility]) Care Management  09/02/2018  Laura Chen 08/11/1965 [id number]   RN Health Coach Initial Assessment  Referral Date:08/09/2018 Referral Source:[employer] High Risk List Reason for Referral:Assess Needs Insurance:[employer] Medicare   Outreach Attempt:  Outreach attempt #1 to patient for initial telephone assessment.  Patient answered and stated she was unavailable to speak at the moment.  Requested telephone call back.   Plan:  RN Health Coach will make another outreach attempt within the month of July.   [place] [phone] [email or web address]

## 2018-09-05 ENCOUNTER — Ambulatory Visit (INDEPENDENT_AMBULATORY_CARE_PROVIDER_SITE_OTHER): Payer: Medicare Other | Admitting: Nurse Practitioner

## 2018-09-05 ENCOUNTER — Encounter: Payer: Self-pay | Admitting: Nurse Practitioner

## 2018-09-05 ENCOUNTER — Other Ambulatory Visit: Payer: Self-pay

## 2018-09-05 VITALS — BP 142/86 | HR 91 | Resp 16 | Ht 64.0 in | Wt 249.8 lb

## 2018-09-05 DIAGNOSIS — N390 Urinary tract infection, site not specified: Secondary | ICD-10-CM

## 2018-09-05 DIAGNOSIS — B9689 Other specified bacterial agents as the cause of diseases classified elsewhere: Secondary | ICD-10-CM

## 2018-09-05 DIAGNOSIS — E119 Type 2 diabetes mellitus without complications: Secondary | ICD-10-CM | POA: Diagnosis not present

## 2018-09-05 DIAGNOSIS — Z79899 Other long term (current) drug therapy: Secondary | ICD-10-CM

## 2018-09-05 DIAGNOSIS — E559 Vitamin D deficiency, unspecified: Secondary | ICD-10-CM

## 2018-09-05 DIAGNOSIS — E039 Hypothyroidism, unspecified: Secondary | ICD-10-CM

## 2018-09-05 DIAGNOSIS — Z0001 Encounter for general adult medical examination with abnormal findings: Secondary | ICD-10-CM | POA: Diagnosis not present

## 2018-09-05 DIAGNOSIS — R300 Dysuria: Secondary | ICD-10-CM

## 2018-09-05 DIAGNOSIS — R319 Hematuria, unspecified: Secondary | ICD-10-CM | POA: Diagnosis not present

## 2018-09-05 DIAGNOSIS — E1165 Type 2 diabetes mellitus with hyperglycemia: Secondary | ICD-10-CM

## 2018-09-05 DIAGNOSIS — B370 Candidal stomatitis: Secondary | ICD-10-CM

## 2018-09-05 DIAGNOSIS — Z1239 Encounter for other screening for malignant neoplasm of breast: Secondary | ICD-10-CM

## 2018-09-05 DIAGNOSIS — N760 Acute vaginitis: Secondary | ICD-10-CM

## 2018-09-05 DIAGNOSIS — M0579 Rheumatoid arthritis with rheumatoid factor of multiple sites without organ or systems involvement: Secondary | ICD-10-CM

## 2018-09-05 LAB — POCT URINALYSIS DIPSTICK
Bilirubin, UA: NEGATIVE
Glucose, UA: NEGATIVE
Ketones, UA: NEGATIVE
Nitrite, UA: NEGATIVE
Protein, UA: POSITIVE — AB
Spec Grav, UA: 1.02 (ref 1.010–1.025)
Urobilinogen, UA: 0.2 E.U./dL
pH, UA: 6 (ref 5.0–8.0)

## 2018-09-05 LAB — POCT URINE DRUG SCREEN
POC Amphetamine UR: NOT DETECTED
POC BENZODIAZEPINES UR: NOT DETECTED
POC Barbiturate UR: NOT DETECTED
POC Cocaine UR: NOT DETECTED
POC Ecstasy UR: NOT DETECTED
POC Marijuana UR: NOT DETECTED
POC Methadone UR: NOT DETECTED
POC Methamphetamine UR: NOT DETECTED
POC Opiate Ur: NOT DETECTED
POC Oxycodone UR: POSITIVE — AB
POC PHENCYCLIDINE UR: NOT DETECTED
POC TRICYCLICS UR: NOT DETECTED

## 2018-09-05 LAB — POCT GLYCOSYLATED HEMOGLOBIN (HGB A1C): Hemoglobin A1C: 6.6 % — AB (ref 4.0–5.6)

## 2018-09-05 MED ORDER — FLUCONAZOLE 150 MG PO TABS: ORAL_TABLET | ORAL | 5 refills | Status: DC

## 2018-09-05 MED ORDER — TRAMADOL HCL 50 MG PO TABS: 100.0000 mg | ORAL_TABLET | Freq: Two times a day (BID) | ORAL | 2 refills | Status: DC | PRN

## 2018-09-05 MED ORDER — METRONIDAZOLE 0.75 % VA GEL: 1.0000 | Freq: Two times a day (BID) | VAGINAL | 0 refills | Status: DC

## 2018-09-05 MED ORDER — AMOXICILLIN-POT CLAVULANATE 875-125 MG PO TABS: 1.0000 | ORAL_TABLET | Freq: Two times a day (BID) | ORAL | 0 refills | Status: DC

## 2018-09-05 NOTE — Progress Notes (Signed)
[facility] [place], [place] [address]  Internal MEDICINE  Office Visit Note  Patient Name: Laura Chen  [id number]  [id number]  Date of Service: 09/15/2018   Pt is here for routine health maintenance examination  Chief Complaint  Patient presents with  . Medicare Wellness  . Hypertension  . Gastroesophageal Reflux  . Diabetes  . Arthritis  . Vaginal Bleeding    pt is having some bleeding, friday and saturday it was heavy bleeding, and other days light spotting, believes she have BV, and posible UTI, itching and burning, pt is swollen in the vaginal area  . Urinary Tract Infection     The patient continues to have vaginal itching and irritation. This is mostly along the outer walls of the labia. Has a rash long the labia. She is also c/o mild vaginal discharge with slight odor. She has been treated on multiple occassions for urinary tract infection and possible vaginitis. Urine cultures have come back with normal urogenital flora. Despite treatment, these symptoms have continued.     Current Medication: Outpatient Encounter Medications as of 09/05/2018  Medication Sig Note  . ACCU-CHEK FASTCLIX LANCETS MISC USE TO TEST BLOOD SUGAR QD UTD   . ACCU-CHEK GUIDE test strip USE TO CHECK BLOOD GLUCOSE  EVERY DAY AS DIRECTED   . aspirin EC 81 MG tablet Take 81 mg by mouth every evening.    Marland Kitchen azaTHIOprine (IMURAN) 50 MG tablet Take 50 mg by mouth daily. 09/06/2018: Reports no longer taking  . baclofen (LIORESAL) 10 MG tablet Take 10 mg by mouth 3 (three) times daily.   Marland Kitchen buPROPion (WELLBUTRIN SR) 100 MG 12 hr tablet Take 1 tablet (100 mg total) by mouth daily.   . cephALEXin (KEFLEX) 500 MG capsule Take 1 capsule (500 mg total) by mouth 3 (three) times daily. (Patient not taking: Reported on 09/06/2018)   . cetirizine (ZYRTEC) 10 MG tablet Take 10 mg by mouth.   . cholecalciferol (VITAMIN D) 1000 units tablet Take 2,000 Units by mouth daily.   . DULoxetine  (CYMBALTA) 20 MG capsule Take 1 capsule (20 mg total) by mouth daily.   . fluconazole (DIFLUCAN) 150 MG tablet Take 1 tablet po once weekly   . fluticasone (FLONASE) 50 MCG/ACT nasal spray USE 1 SPRAY IN BOTH  NOSTRILS DAILY.   Marland Kitchen ketoconazole (NIZORAL) 2 % cream Apply 1 application topically daily. (Patient not taking: Reported on 09/06/2018) 09/06/2018: Reports no longer taking  . LINZESS 290 MCG CAPS capsule TAKE 1 CAPSULE BY MOUTH  DAILY   . metroNIDAZOLE (METROGEL) 0.75 % vaginal gel Place 1 Applicatorful vaginally 2 (two) times daily.   Marland Kitchen nystatin (MYCOSTATIN) 100000 UNIT/ML suspension Take 5 mLs (500,000 Units total) by mouth 4 (four) times daily. (Patient not taking: Reported on 09/06/2018) 09/06/2018: Reports not longer taking  . omeprazole (PRILOSEC) 20 MG capsule Take 1 capsule (20 mg total) by mouth daily.   . Oxycodone HCl 10 MG TABS Take 10 mg by mouth 3 (three) times daily as needed (for pain).   . phenazopyridine (PYRIDIUM) 200 MG tablet Take 1 tablet (200 mg total) by mouth 3 (three) times daily as needed for pain. (Patient not taking: Reported on 09/06/2018) 09/06/2018: Reports not longer taking  . ponatinib HCl (ICLUSIG) 15 MG tablet Take 15 mg by mouth daily.    . potassium chloride SA (K-DUR) 20 MEQ tablet TAKE 1 TABLET BY MOUTH TWO  TIMES DAILY (Patient not taking: Reported on 09/06/2018) 09/06/2018: Reports  not longer taking  . predniSONE (DELTASONE) 5 MG tablet Take 5 mg by mouth daily with breakfast.   . rosuvastatin (CRESTOR) 5 MG tablet TAKE 1 TABLET BY MOUTH  DAILY WITH SUPPER   . traMADol (ULTRAM) 50 MG tablet Take 2 tablets (100 mg total) by mouth every 12 (twelve) hours as needed.   . [DISCONTINUED] fluconazole (DIFLUCAN) 150 MG tablet Take 1 tablet po once weekly   . [DISCONTINUED] metroNIDAZOLE (METROGEL) 0.75 % vaginal gel Place 1 Applicatorful vaginally 2 (two) times daily.   . [DISCONTINUED] traMADol (ULTRAM) 50 MG tablet Take 2 tablets (100 mg total) by mouth every 12  (twelve) hours as needed.   Marland Kitchen amoxicillin-clavulanate (AUGMENTIN) 875-125 MG tablet Take 1 tablet by mouth 2 (two) times daily.   Marland Kitchen levofloxacin (LEVAQUIN) 500 MG tablet Take 1 tablet (500 mg total) by mouth daily.   Marland Kitchen levothyroxine (SYNTHROID) 50 MCG tablet Take 1 tablet (50 mcg total) by mouth daily.   . [DISCONTINUED] amoxicillin-clavulanate (AUGMENTIN) 875-125 MG tablet Take 1 tablet by mouth 2 (two) times daily. (Patient not taking: Reported on 09/05/2018)    No facility-administered encounter medications on file as of 09/05/2018.     Surgical History: Past Surgical History:  Procedure Laterality Date  . DEXA  2010   osteopenia  . MYOMECTOMY  2008  . right ankle AFO  2011  . TRANSTHORACIC ECHOCARDIOGRAM  04/5007   nl systolic/diastolic fxn, EF 38%, mild MR, normal PA pressures  . US/HSG  05/2010   possible endometrial polyp, rec rpt 8 wks continue loestrin 24 [facility])    Medical History: Past Medical History:  Diagnosis Date  . Asthma   . CML (chronic myeloid leukemia) ([place]) 2011   (Dr. Alyse Low) stopped gleevec 2/2 side effects  . Diabetes mellitus without complication ([place])   . GERD (gastroesophageal reflux disease)   . History of shingles   . History of syphilis 1990   s/p treatment  . Hypertension   . Leukemia ([place])   . Osteopenia    due to prednisone use, was on reclast  . PUD (peptic ulcer disease)   . Rheumatoid arthritis(714.0) 1990s   Jacoud's arthropathy, previously treated with MTX, TNFa (remicade, enbrel, humira, orencia, rituximab, and actemra)  . SLE (systemic lupus erythematosus) ([place]) 09/2009   Rheum-Dr. Joan Mayans  . Thyroid goiter    Endo-Dr. Eddie Dibbles    Family History: Family History  Problem Relation Age of Onset  . Heart attack Maternal Grandmother   . Coronary artery disease Maternal Grandmother   . Hypertension Mother   . Hypertension Other        Aunt      Review of Systems  Constitutional: Negative for activity change, chills,  fatigue and fever.  HENT: Negative for congestion, rhinorrhea, sinus pressure and voice change.   Respiratory: Negative for apnea, cough, chest tightness, shortness of breath and wheezing.   Cardiovascular: Negative for chest pain, palpitations and leg swelling.  Gastrointestinal: Negative for abdominal distention, abdominal pain, constipation, diarrhea, nausea and vomiting.  Endocrine: Negative for polydipsia and polyuria.  Genitourinary: Positive for dysuria, pelvic pain and vaginal discharge. Negative for decreased urine volume, difficulty urinating, flank pain, frequency, genital sores, hematuria, urgency, vaginal bleeding and vaginal pain.       Vaginal itching and irritation. Yeast infection development after treatment with multiple rounds antibiotics .  Musculoskeletal: Positive for arthralgias, gait problem and joint swelling. Negative for back pain.  Skin: Negative for color change, rash and wound.  Allergic/Immunologic: Negative  for environmental allergies.  Neurological: Positive for weakness. Negative for dizziness, facial asymmetry and headaches.  Hematological: Negative for adenopathy.  Psychiatric/Behavioral: Negative for dysphoric mood. The patient is not nervous/anxious.      Today's Vitals   09/05/18 1445  BP: (!) 142/86  Pulse: 91  Resp: 16  SpO2: 98%  Weight: 249 lb 12.8 oz (113.3 kg)  Height: 5\' 4"  (1.626 m)   Body mass index is 42.88 kg/m.  Physical Exam Vitals signs and nursing note reviewed.  Constitutional:      General: She is not in acute distress.    Appearance: Normal appearance. She is well-developed. She is not diaphoretic.  HENT:     Head: Normocephalic and atraumatic.     Nose: Nose normal.     Mouth/Throat:     Pharynx: No oropharyngeal exudate.  Eyes:     Conjunctiva/sclera: Conjunctivae normal.     Pupils: Pupils are equal, round, and reactive to light.  Neck:     Musculoskeletal: Normal range of motion and neck supple.     Thyroid: No  thyromegaly.     Vascular: No carotid bruit or JVD.     Trachea: No tracheal deviation.  Cardiovascular:     Rate and Rhythm: Normal rate and regular rhythm.     Heart sounds: Normal heart sounds. No murmur. No friction rub. No gallop.   Pulmonary:     Effort: Pulmonary effort is normal. No respiratory distress.     Breath sounds: Normal breath sounds. No wheezing or rales.  Abdominal:     General: Bowel sounds are normal.     Palpations: Abdomen is soft.     Tenderness: There is no abdominal tenderness.  Genitourinary:    Comments: Urine sample positive for protein as well as moderate WBC and moderate blood.  Musculoskeletal:     Comments: Generalized joint pain with point tenderness present.   Lymphadenopathy:     Cervical: No cervical adenopathy.  Skin:    Capillary Refill: Capillary refill takes less than 2 seconds.     Findings: No erythema or rash.  Neurological:     Mental Status: She is alert and oriented to person, place, and time.     Cranial Nerves: No cranial nerve deficit.  Psychiatric:        Mood and Affect: Mood is anxious and depressed.        Speech: Speech normal.        Behavior: Behavior normal.        Thought Content: Thought content normal.        Judgment: Judgment normal.    Depression screen [facility] 2/9 09/06/2018 09/05/2018 08/19/2018 07/11/2018 07/01/2018  Decreased Interest 0 0 0 0 0  Down, Depressed, Hopeless 0 0 0 0 0  PHQ - 2 Score 0 0 0 0 0  Altered sleeping - - - - -  Tired, decreased energy - - - - -  Change in appetite - - - - -  Feeling bad or failure about yourself  - - - - -  Trouble concentrating - - - - -  Moving slowly or fidgety/restless - - - - -  Suicidal thoughts - - - - -  PHQ-9 Score - - - - -    Functional Status Survey: Is the patient deaf or have difficulty hearing?: No Does the patient have difficulty seeing, even when wearing glasses/contacts?: No Does the patient have difficulty concentrating, remembering, or making  decisions?: No Does the patient have  difficulty walking or climbing stairs?: Yes Does the patient have difficulty dressing or bathing?: No Does the patient have difficulty doing errands alone such as visiting a doctor's office or shopping?: No  MMSE - [place] Exam 09/05/2018 06/12/2017  Orientation to time 5 5  Orientation to Place 5 5  Registration 3 3  Attention/ Calculation 5 5  Recall 3 3  Language- name 2 objects 2 2  Language- repeat 1 1  Language- follow 3 step command 3 3  Language- read & follow direction 1 1  Write a sentence 1 1  Copy design 1 1  Total score 30 30    Fall Risk  09/06/2018 09/05/2018 08/19/2018 07/11/2018 07/01/2018  Falls in the past year? 0 0 1 0 0  Number falls in past yr: - - 0 - -  Injury with Fall? - - 0 - -  Risk for fall due to : Medication side effect;Impaired mobility - Medication side effect;Impaired balance/gait;Impaired mobility - -  Follow up Falls evaluation completed;Education provided;Falls prevention discussed - Falls evaluation completed;Education provided;Falls prevention discussed - -      LABS: Recent Results (from the past 2160 hour(s))  POCT Urinalysis Dipstick     Status: Abnormal   Collection Time: 07/01/18  3:30 PM  Result Value Ref Range   Color, UA     Clarity, UA     Glucose, UA Negative Negative   Bilirubin, UA negative    Ketones, UA negative    Spec Grav, UA 1.015 1.010 - 1.025   Blood, UA moderate    pH, UA 6.0 5.0 - 8.0   Protein, UA Negative Negative   Urobilinogen, UA 0.2 0.2 or 1.0 E.U./dL   Nitrite, UA negative    Leukocytes, UA Large (3+) (A) Negative   Appearance     Odor    CULTURE, URINE COMPREHENSIVE     Status: Abnormal   Collection Time: 07/01/18  4:22 PM   Specimen: Urine   URINE  Result Value Ref Range   Urine Culture, Comprehensive Final report (A)    Organism ID, Bacteria Enterococcus faecalis (A)     Comment: 50,000-100,000 colony forming units per mL For Enterococcus species,  aminoglycosides (except for high-level resistance screening), cephalosporins, clindamycin, and trimethoprim-sulfamethoxazole are not effective clinically. (CLSI, M100-S26, 2016) Enterococci susceptible to penicillin are predictably susceptible to ampicillin, amoxicillin, ampicillin-sulbactam, amoxicillin-clavulanate, and piperacillin-tazobactam for non-beta-lactamase producing enterococci. (CLSI 2018)    Organism ID, Bacteria Citrobacter koseri (A)     Comment: 25,000-50,000 colony forming units per mL   Organism ID, Bacteria Comment     Comment: Mixed urogenital flora 10,000-25,000 colony forming units per mL    ANTIMICROBIAL SUSCEPTIBILITY Comment     Comment:       ** S = Susceptible; I = Intermediate; R = Resistant **                    P = Positive; N = Negative             MICS are expressed in micrograms per mL    Antibiotic                 RSLT#1    RSLT#2    RSLT#3    RSLT#4 Amoxicillin/Clavulanic Acid              S Cefepime  S Ceftriaxone                              S Cefuroxime                               S Ciprofloxacin                  S         S Ertapenem                                S Gentamicin                               S Imipenem                                 S Levofloxacin                   S         S Meropenem                                S Nitrofurantoin                 S         S Penicillin                     S Piperacillin/Tazobactam                  S Tetracycline                   R         S Tobramycin                               S Trimethoprim/Sulfa                       S Vancomycin                     S   CULTURE, URINE COMPREHENSIVE     Status: Abnormal   Collection Time: 09/05/18 12:00 AM   Specimen: Urine   URINE  Result Value Ref Range   Urine Culture, Comprehensive Final report (A)    Organism ID, Bacteria Enterococcus faecalis (A)     Comment: Greater than 100,000 colony forming units per  mL   ANTIMICROBIAL SUSCEPTIBILITY Comment     Comment:       ** S = Susceptible; I = Intermediate; R = Resistant **                    P = Positive; N = Negative             MICS are expressed in micrograms per mL    Antibiotic                 RSLT#1    RSLT#2    RSLT#3    RSLT#4 Ciprofloxacin  S Levofloxacin                   S Nitrofurantoin                 S Penicillin                     S Tetracycline                   R Vancomycin                     S   POCT HgB A1C     Status: Abnormal   Collection Time: 09/05/18  3:06 PM  Result Value Ref Range   Hemoglobin A1C 6.6 (A) 4.0 - 5.6 %   HbA1c POC (<> result, manual entry)     HbA1c, POC (prediabetic range)     HbA1c, POC (controlled diabetic range)    POCT Urinalysis Dipstick     Status: Abnormal   Collection Time: 09/05/18  3:06 PM  Result Value Ref Range   Color, UA     Clarity, UA     Glucose, UA Negative Negative   Bilirubin, UA negative    Ketones, UA negative    Spec Grav, UA 1.020 1.010 - 1.025   Blood, UA moderate    pH, UA 6.0 5.0 - 8.0   Protein, UA Positive (A) Negative   Urobilinogen, UA 0.2 0.2 or 1.0 E.U./dL   Nitrite, UA negative    Leukocytes, UA Moderate (2+) (A) Negative   Appearance     Odor    POCT Urine Drug Screen     Status: Abnormal   Collection Time: 09/05/18  3:07 PM  Result Value Ref Range   POC METHAMPHETAMINE UR None Detected None Detected   POC Opiate Ur None Detected None Detected   POC Barbiturate UR None Detected None Detected   POC Amphetamine UR None Detected None Detected   POC Oxycodone UR Positive (A) None Detected   POC Cocaine UR None Detected None Detected   POC Ecstasy UR None Detected None Detected   POC TRICYCLICS UR None Detected None Detected   POC PHENCYCLIDINE UR None Detected None Detected   POC MARIJUANA UR None Detected None Detected   POC METHADONE UR None Detected None Detected   POC BENZODIAZEPINES UR None Detected None Detected   URINE  TEMPERATURE     POC DRUG SCREEN OXIDANTS URINE     POC SPECIFIC GRAVITY URINE     POC PH URINE     Methylenedioxyamphetamine    Vitamin D 1,25 dihydroxy     Status: None   Collection Time: 09/09/18  9:03 AM  Result Value Ref Range   Vitamin D 1, 25 (OH)2 Total 28 pg/mL    Comment: Reference Range: Adults: 21 - 65    Vitamin D2 1, 25 (OH)2 <10 pg/mL    Comment: This test was developed and its performance characteristics determined by [facility]. It has not been cleared or approved by the Food and Drug Administration.    Vitamin D3 1, 25 (OH)2 25 pg/mL    Comment: This test was developed and its performance characteristics determined by [facility]. It has not been cleared or approved by the Food and Drug Administration.   Lipid panel     Status: None   Collection Time: 09/09/18  9:03 AM  Result Value Ref Range   Cholesterol, Total 194 100 - 199 mg/dL  Triglycerides 143 0 - 149 mg/dL   HDL 90 >39 mg/dL   VLDL Cholesterol Cal 29 5 - 40 mg/dL   LDL Calculated 75 0 - 99 mg/dL   Chol/HDL Ratio 2.2 0.0 - 4.4 ratio    Comment:                                   T. Chol/HDL Ratio                                             Men  Women                               1/2 Avg.Risk  3.4    3.3                                   Avg.Risk  5.0    4.4                                2X Avg.Risk  9.6    7.1                                3X Avg.Risk 23.4   11.0   TSH     Status: Abnormal   Collection Time: 09/09/18  9:03 AM  Result Value Ref Range   TSH 9.070 (H) 0.450 - 4.500 uIU/mL  T4, free     Status: None   Collection Time: 09/09/18  9:03 AM  Result Value Ref Range   Free T4 0.95 0.82 - 1.77 ng/dL  Comprehensive metabolic panel     Status: Abnormal   Collection Time: 09/09/18  9:03 AM  Result Value Ref Range   Glucose 82 65 - 99 mg/dL   BUN 16 6 - 24 mg/dL   Creatinine, Ser 1.33 (H) 0.57 - 1.00 mg/dL   GFR calc non Af Amer 47 (L) >59 mL/min/1.73   GFR calc Af Amer 54 (L) >59 mL/min/1.73    BUN/Creatinine Ratio 12 9 - 23   Sodium 138 134 - 144 mmol/L   Potassium 4.1 3.5 - 5.2 mmol/L   Chloride 99 96 - 106 mmol/L   CO2 21 20 - 29 mmol/L   Calcium 9.6 8.7 - 10.2 mg/dL   Total Protein 6.4 6.0 - 8.5 g/dL   Albumin 4.6 3.8 - 4.8 g/dL   Globulin, Total 1.8 1.5 - 4.5 g/dL   Albumin/Globulin Ratio 2.6 (H) 1.2 - 2.2   Bilirubin Total 0.2 0.0 - 1.2 mg/dL   Alkaline Phosphatase 471 (H) 39 - 117 IU/L   AST 173 (H) 0 - 40 IU/L   ALT 219 (H) 0 - 32 IU/L  CBC with Differential/Platelet     Status: Abnormal   Collection Time: 09/09/18  9:03 AM  Result Value Ref Range   WBC 13.4 (H) 3.4 - 10.8 x10E3/uL   RBC 6.59 (H) 3.77 - 5.28 x10E6/uL   Hemoglobin 16.3 (H) 11.1 - 15.9 g/dL   Hematocrit 52.4 (H) 34.0 - 46.6 %   MCV 80 79 -  97 fL   MCH 24.7 (L) 26.6 - 33.0 pg   MCHC 31.1 (L) 31.5 - 35.7 g/dL   RDW 21.8 (H) 11.7 - 15.4 %   Platelets 230 150 - 450 x10E3/uL   Neutrophils 44 Not Estab. %   Lymphs 33 Not Estab. %   Monocytes 18 Not Estab. %   Eos 1 Not Estab. %   Basos 0 Not Estab. %   Neutrophils Absolute 6.0 1.4 - 7.0 x10E3/uL   Lymphocytes Absolute 4.4 (H) 0.7 - 3.1 x10E3/uL   Monocytes Absolute 2.4 (H) 0.1 - 0.9 x10E3/uL   EOS (ABSOLUTE) 0.2 0.0 - 0.4 x10E3/uL   Basophils Absolute 0.0 0.0 - 0.2 x10E3/uL   Immature Granulocytes 4 Not Estab. %   Immature Grans (Abs) 0.5 (H) 0.0 - 0.1 x10E3/uL    Comment: (An elevated percentage of Immature Granulocytes has not been found to be clinically significant as a sole clinical predictor of disease. Does NOT include bands or blast cells.  Pregnancy associated physiological leukocytosis may also show increased immature granulocytes without clinical significance.)     Assessment/Plan: 1. Encounter for general adult medical examination with abnormal findings Annual health maintenance exam today.  - CBC with Differential/Platelet; Future - Comprehensive metabolic panel; Future - T4, free; Future - TSH; Future - TSH - T4, free -  Comprehensive metabolic panel - CBC with Differential/Platelet  2. Urinary tract infection with hematuria, site unspecified Start treatment with augmentin. Later changed to levofloxacin 500mg  daily based on urine culture results. She should follow up with urogynecology as scheduled.  - amoxicillin-clavulanate (AUGMENTIN) 875-125 MG tablet; Take 1 tablet by mouth 2 (two) times daily.  Dispense: 42 tablet; Refill: 0 - CULTURE, URINE COMPREHENSIVE - levofloxacin (LEVAQUIN) 500 MG tablet; Take 1 tablet (500 mg total) by mouth daily.  Dispense: 10 tablet; Refill: 0  3. Bacterial vaginitis Metronidazole vaginal gel should be used nightly for 7 nights.  - metroNIDAZOLE (METROGEL) 0.75 % vaginal gel; Place 1 Applicatorful vaginally 2 (two) times daily.  Dispense: 70 g; Refill: 0  4. Oral candidiasis Diflucan 150mg  once weekly to combat persistent thrush and yeast infection  - fluconazole (DIFLUCAN) 150 MG tablet; Take 1 tablet po once weekly  Dispense: 4 tablet; Refill: 5  5. Type 2 diabetes mellitus without complication, without long-term current use of insulin (HCC) - POCT HgB A1C 6.6 today and controlled through diet. Continue to monitor blood sugars closely.  - Comprehensive metabolic panel; Future - T4, free; Future - TSH; Future - Lipid panel; Future - Lipid panel - TSH - T4, free - Comprehensive metabolic panel  6. Rheumatoid arthritis involving multiple sites with positive rheumatoid factor ([place]) Renew prescription for tramadol 100mg  twice daily as needed for mild to moderate pain. New prescription sent to her pharmacy today.  - traMADol (ULTRAM) 50 MG tablet; Take 2 tablets (100 mg total) by mouth every 12 (twelve) hours as needed.  Dispense: 120 tablet; Refill: 2  7. Acquired hypothyroidism - levothyroxine (SYNTHROID) 50 MCG tablet; Take 1 tablet (50 mcg total) by mouth daily.  Dispense: 30 tablet; Refill: 3  8. Vitamin D deficiency - Vitamin D 1,25 dihydroxy; Future - Vitamin  D 1,25 dihydroxy  9. Encounter for long-term (current) use of medications - POCT Urine Drug Screen appropriately positive for oxycodone which is prescribed per pain management provider.   10. Screening for breast cancer - MM DIGITAL SCREENING BILATERAL; Future  11. Dysuria - POCT Urinalysis Dipstick - CULTURE, URINE COMPREHENSIVE  General Counseling: Macie  verbalizes understanding of the findings of todays visit and agrees with plan of treatment. I have discussed any further diagnostic evaluation that may be needed or ordered today. We also reviewed her medications today. she has been encouraged to call the office with any questions or concerns that should arise related to todays visit.    Counseling:  This patient was seen by Leretha Pol FNP Collaboration with Dr Lavera Guise as a part of collaborative care agreement  Orders Placed This Encounter  Procedures  . CULTURE, URINE COMPREHENSIVE  . MM DIGITAL SCREENING BILATERAL  . CBC with Differential/Platelet  . Comprehensive metabolic panel  . T4, free  . TSH  . Lipid panel  . Vitamin D 1,25 dihydroxy  . POCT HgB A1C  . POCT Urine Drug Screen  . POCT Urinalysis Dipstick    Meds ordered this encounter  Medications  . metroNIDAZOLE (METROGEL) 0.75 % vaginal gel    Sig: Place 1 Applicatorful vaginally 2 (two) times daily.    Dispense:  70 g    Refill:  0    Order Specific Question:   Supervising Provider    Answer:   Lavera Guise [[id number]]  . fluconazole (DIFLUCAN) 150 MG tablet    Sig: Take 1 tablet po once weekly    Dispense:  4 tablet    Refill:  5    Order Specific Question:   Supervising Provider    Answer:   Lavera Guise [[id number]]  . amoxicillin-clavulanate (AUGMENTIN) 875-125 MG tablet    Sig: Take 1 tablet by mouth 2 (two) times daily.    Dispense:  42 tablet    Refill:  0    Order Specific Question:   Supervising Provider    Answer:   Lavera Guise [[id number]]  . traMADol (ULTRAM) 50 MG tablet    Sig: Take 2  tablets (100 mg total) by mouth every 12 (twelve) hours as needed.    Dispense:  120 tablet    Refill:  2    Order Specific Question:   Supervising Provider    Answer:   Lavera Guise [[id number]]  . levofloxacin (LEVAQUIN) 500 MG tablet    Sig: Take 1 tablet (500 mg total) by mouth daily.    Dispense:  10 tablet    Refill:  0    Order Specific Question:   Supervising Provider    Answer:   Lavera Guise [[id number]]  . levothyroxine (SYNTHROID) 50 MCG tablet    Sig: Take 1 tablet (50 mcg total) by mouth daily.    Dispense:  30 tablet    Refill:  3    Order Specific Question:   Supervising Provider    Answer:   Lavera Guise [[id number]]    Time spent: [place], MD  Internal Medicine

## 2018-09-06 ENCOUNTER — Other Ambulatory Visit: Payer: Self-pay | Admitting: *Deleted

## 2018-09-06 ENCOUNTER — Encounter: Payer: Self-pay | Admitting: *Deleted

## 2018-09-06 ENCOUNTER — Encounter
Admit: 2018-09-06 | Discharge: 2018-09-07 | Payer: MEDICARE | Attending: Obstetrics & Gynecology | Primary: Obstetrics & Gynecology

## 2018-09-06 DIAGNOSIS — N939 Abnormal uterine and vaginal bleeding, unspecified: Principal | ICD-10-CM

## 2018-09-06 NOTE — Patient Outreach (Signed)
[facility]) Care Management  [place]  09/06/2018   Laura Chen 03-20-1965 [id number]   RN Health Coach Initial Assessment   Referral Date:  08/09/2018 Referral Source:  [facility] High Risk List Reason for Referral:  Assess Needs Insurance:  [employer]   Outreach Attempt:  Successful telephone outreach to patient for initial telephone assessment.  HIPAA verified with patient.  Patient completed initial telephone assessment.  Social:  Patient lives at home with her mother.  Reports being independent with ADLs and mother assisting with IADLs, as patient states she tires easily.  Mother transports her to her medical appointments.  Ambulates with walker and reports 1 fall in the past year without injuries.  DME in the home include:  Straight cane, Rolator walker, CBG meter, blood pressure cuff, scale, shower chair with back, and a grab bar in the shower.   Conditions:   Per chart review and discussion with patient, PMH include but not limited to:  Arthritis, bilateral cataract extraction, congestive heart failure, chronic myeloid leukemia, clotting disorder, depression, diabetes, reflux, hypertension, hyperlipidemia, lupus, and rheumatoid arthritis.  Patient denies any recent emergency room visits or hospital admissions.  Reports checking her blood pressure about 3-4 times a week and blood pressure being in normal range.  Checks blood sugars about 3 times a week with fasting ranges of 80-90's.  Last Hgb A1C was 6.5 on 03/19/2018.  Does report she has about 3-4 hypoglycemic episodes a week.  Is on no medications for diabetes and feels hypoglycemia is related to not eating.  Discussed importance of balanced meals throughout the day.  Recently diagnosed with urinary tract infection and is being treated with antibiotics.   Medications:  Patient reports taking about 13 medications.  Manages medications herself without difficulties using pill box at times.  Denies  any issues with affording medications at this time.  Encounter Medications:  Outpatient Encounter Medications as of 09/06/2018  Medication Sig Note  . ACCU-CHEK FASTCLIX LANCETS MISC USE TO TEST BLOOD SUGAR QD UTD   . ACCU-CHEK GUIDE test strip USE TO CHECK BLOOD GLUCOSE  EVERY DAY AS DIRECTED   . amoxicillin-clavulanate (AUGMENTIN) 875-125 MG tablet Take 1 tablet by mouth 2 (two) times daily.   Marland Kitchen aspirin EC 81 MG tablet Take 81 mg by mouth every evening.    . baclofen (LIORESAL) 10 MG tablet Take 10 mg by mouth 3 (three) times daily.   Marland Kitchen buPROPion (WELLBUTRIN SR) 100 MG 12 hr tablet Take 1 tablet (100 mg total) by mouth daily.   . cetirizine (ZYRTEC) 10 MG tablet Take 10 mg by mouth.   . cholecalciferol (VITAMIN D) 1000 units tablet Take 2,000 Units by mouth daily.   . DULoxetine (CYMBALTA) 20 MG capsule Take 1 capsule (20 mg total) by mouth daily.   . fluconazole (DIFLUCAN) 150 MG tablet Take 1 tablet po once weekly   . fluticasone (FLONASE) 50 MCG/ACT nasal spray USE 1 SPRAY IN BOTH  NOSTRILS DAILY.   Marland Kitchen LINZESS 290 MCG CAPS capsule TAKE 1 CAPSULE BY MOUTH  DAILY   . metroNIDAZOLE (METROGEL) 0.75 % vaginal gel Place 1 Applicatorful vaginally 2 (two) times daily.   Marland Kitchen omeprazole (PRILOSEC) 20 MG capsule Take 1 capsule (20 mg total) by mouth daily.   . Oxycodone HCl 10 MG TABS Take 10 mg by mouth 3 (three) times daily as needed (for pain).   . ponatinib HCl (ICLUSIG) 15 MG tablet Take 15 mg by mouth daily.    Marland Kitchen  predniSONE (DELTASONE) 5 MG tablet Take 5 mg by mouth daily with breakfast.   . rosuvastatin (CRESTOR) 5 MG tablet TAKE 1 TABLET BY MOUTH  DAILY WITH SUPPER   . spironolactone (ALDACTONE) 50 MG tablet daily.   . traMADol (ULTRAM) 50 MG tablet Take 2 tablets (100 mg total) by mouth every 12 (twelve) hours as needed.   Marland Kitchen azaTHIOprine (IMURAN) 50 MG tablet Take 50 mg by mouth daily. 09/06/2018: Reports no longer taking  . cephALEXin (KEFLEX) 500 MG capsule Take 1 capsule (500 mg total) by  mouth 3 (three) times daily. (Patient not taking: Reported on 09/06/2018)   . ketoconazole (NIZORAL) 2 % cream Apply 1 application topically daily. (Patient not taking: Reported on 09/06/2018) 09/06/2018: Reports no longer taking  . nystatin (MYCOSTATIN) 100000 UNIT/ML suspension Take 5 mLs (500,000 Units total) by mouth 4 (four) times daily. (Patient not taking: Reported on 09/06/2018) 09/06/2018: Reports not longer taking  . phenazopyridine (PYRIDIUM) 200 MG tablet Take 1 tablet (200 mg total) by mouth 3 (three) times daily as needed for pain. (Patient not taking: Reported on 09/06/2018) 09/06/2018: Reports not longer taking  . potassium chloride SA (K-DUR) 20 MEQ tablet TAKE 1 TABLET BY MOUTH TWO  TIMES DAILY (Patient not taking: Reported on 09/06/2018) 09/06/2018: Reports not longer taking   No facility-administered encounter medications on file as of 09/06/2018.     Functional Status:  In your present state of health, do you have any difficulty performing the following activities: 09/06/2018 09/05/2018  Hearing? N N  Vision? N N  Difficulty concentrating or making decisions? N N  Walking or climbing stairs? Y Y  Comment knee pain with walking and climbing stairs -  Dressing or bathing? N N  Doing errands, shopping? N N  Preparing Food and eating ? - N  Using the Toilet? - N  In the past six months, have you accidently leaked urine? - N  Do you have problems with loss of bowel control? - N  Managing your Medications? - N  Managing your Finances? - N  Housekeeping or managing your Housekeeping? - N  Some recent data might be hidden    Fall/Depression Screening: Fall Risk  09/06/2018 09/05/2018 08/19/2018  Falls in the past year? 0 0 1  Number falls in past yr: - - 0  Injury with Fall? - - 0  Risk for fall due to : Medication side effect;Impaired mobility - Medication side effect;Impaired balance/gait;Impaired mobility  Follow up Falls evaluation completed;Education provided;Falls prevention  discussed - Falls evaluation completed;Education provided;Falls prevention discussed   PHQ 2/9 Scores 09/06/2018 09/05/2018 08/19/2018 07/11/2018 07/01/2018 03/19/2018 02/21/2018  PHQ - 2 Score 0 0 0 0 0 0 0  PHQ- 9 Score - - - - - - 0   THN CM Care Plan Problem One     Most Recent Value  Care Plan Problem One  Knowledge deficiet related to self care management of diabetes  Role Documenting the Problem One  [place] for Problem One  Active  THN Long Term Goal   Patient will maintain Hgb A1C below 7 within the next 90 days.  THN Long Term Goal Start Date  09/06/18  Interventions for Problem One Long Term Goal  Current care plan and goals reviewed and discussed, encouraged to keep and attend scheduled medical appointments, reviewed medications and encouraged medication compliance, reviewed signs and sympoms of hypo and hyperglycemia, reviewed diabetic diet and encouraged patient to eat well balanced diet throughout  the day to help prevent hypoglycemic events, sending Living Well with Diabetes Educational packet, sending 2020 Calendar Booklet to assist with keeping appointments and documentation of blood sugars, discussed how to treat hypoglycemia and importance of trying to avoid hypoglycemic episodes     Appointments:  Attended appointment with primary care provider, Leretha Pol NP on 09/05/2018.   Advanced Directives:  Denies having an advance directive in place and does not wish to create one at this time.   Consent:  [facility] services reviewed and discussed with patient.  Patient verbally agrees to [place].  Plan: RN Health Coach will send primary MD barriers letter. RN Health Coach will route initial telephone assessment note to primary MD. [place] will send patient [place]. RN Health Coach will send patient 2020 Calendar Booklet. RN Health Coach will send patient Living Well with Diabetes Educational Packet. RN Health Coach will make next telephone  outreach to patient in the month of August.   Nera Haworth RN [place] [phone] [email or web address]

## 2018-09-08 LAB — CULTURE, URINE COMPREHENSIVE

## 2018-09-09 ENCOUNTER — Telehealth: Payer: Self-pay

## 2018-09-09 ENCOUNTER — Encounter: Admit: 2018-09-09 | Discharge: 2018-09-10 | Payer: MEDICARE

## 2018-09-09 DIAGNOSIS — D509 Iron deficiency anemia, unspecified: Secondary | ICD-10-CM | POA: Diagnosis not present

## 2018-09-09 DIAGNOSIS — E559 Vitamin D deficiency, unspecified: Secondary | ICD-10-CM | POA: Diagnosis not present

## 2018-09-09 DIAGNOSIS — Z0001 Encounter for general adult medical examination with abnormal findings: Secondary | ICD-10-CM | POA: Diagnosis not present

## 2018-09-09 DIAGNOSIS — E1165 Type 2 diabetes mellitus with hyperglycemia: Secondary | ICD-10-CM | POA: Diagnosis not present

## 2018-09-09 DIAGNOSIS — C9210 Chronic myeloid leukemia, BCR/ABL-positive, not having achieved remission: Secondary | ICD-10-CM | POA: Diagnosis not present

## 2018-09-09 DIAGNOSIS — N939 Abnormal uterine and vaginal bleeding, unspecified: Secondary | ICD-10-CM

## 2018-09-09 NOTE — Telephone Encounter (Signed)
Continue same med for uti

## 2018-09-10 ENCOUNTER — Telehealth: Payer: Self-pay

## 2018-09-10 MED ORDER — LEVOTHYROXINE SODIUM 50 MCG PO TABS: 50.0000 ug | ORAL_TABLET | Freq: Every day | ORAL | 3 refills | Status: DC

## 2018-09-10 MED ORDER — LEVOFLOXACIN 500 MG PO TABS: 500.0000 mg | ORAL_TABLET | Freq: Every day | ORAL | 0 refills | Status: DC

## 2018-09-10 NOTE — Progress Notes (Signed)
Please let the patient know that I had to change antibiotics for uti. Please stop augmentin. Added levofloxacin 500mg  daily and sent to her pharmacy. Also, on her labs, her liver functions were very elevated. I would like to retest this. Possibly related to chemotherapy medication she is taking. I am not sure. I have lab slip ready to go for her. Thanks.

## 2018-09-10 NOTE — Telephone Encounter (Signed)
Pt advised stopped augmentin and start levaquin and also thyroid is low we add levothyroxine and we mailed labslip repeat labs and make follow up after this

## 2018-09-10 NOTE — Progress Notes (Signed)
Thyroid panel also very low. I added levothyroxine 4mcg daily. She should take this in the morning prior to eating. I am still waiting on some other lab results.

## 2018-09-10 NOTE — Telephone Encounter (Signed)
-----   Message from Ronnell Freshwater, NP sent at 09/10/2018 10:08 AM EDT ----- Thyroid panel also very low. I added levothyroxine 38mcg daily. She should take this in the morning prior to eating. I am still waiting on some other lab results.

## 2018-09-13 LAB — LIPID PANEL
Chol/HDL Ratio: 2.2 ratio (ref 0.0–4.4)
Cholesterol, Total: 194 mg/dL (ref 100–199)
HDL: 90 mg/dL (ref >39)
LDL Calculated: 75 mg/dL (ref 0–99)
Triglycerides: 143 mg/dL (ref 0–149)
VLDL Cholesterol Cal: 29 mg/dL (ref 5–40)

## 2018-09-13 LAB — COMPREHENSIVE METABOLIC PANEL
ALT: 219 IU/L — ABNORMAL HIGH (ref 0–32)
AST: 173 IU/L — ABNORMAL HIGH (ref 0–40)
Albumin/Globulin Ratio: 2.6 — ABNORMAL HIGH (ref 1.2–2.2)
Albumin: 4.6 g/dL (ref 3.8–4.8)
Alkaline Phosphatase: 471 IU/L — ABNORMAL HIGH (ref 39–117)
BUN/Creatinine Ratio: 12 (ref 9–23)
BUN: 16 mg/dL (ref 6–24)
Bilirubin Total: 0.2 mg/dL (ref 0.0–1.2)
CO2: 21 mmol/L (ref 20–29)
Calcium: 9.6 mg/dL (ref 8.7–10.2)
Chloride: 99 mmol/L (ref 96–106)
Creatinine, Ser: 1.33 mg/dL — ABNORMAL HIGH (ref 0.57–1.00)
GFR calc Af Amer: 54 mL/min/{1.73_m2} — ABNORMAL LOW (ref >59)
GFR calc non Af Amer: 47 mL/min/{1.73_m2} — ABNORMAL LOW (ref >59)
Globulin, Total: 1.8 g/dL (ref 1.5–4.5)
Glucose: 82 mg/dL (ref 65–99)
Potassium: 4.1 mmol/L (ref 3.5–5.2)
Sodium: 138 mmol/L (ref 134–144)
Total Protein: 6.4 g/dL (ref 6.0–8.5)

## 2018-09-13 LAB — CBC WITH DIFFERENTIAL/PLATELET
Basophils Absolute: 0 10*3/uL (ref 0.0–0.2)
Basos: 0 %
EOS (ABSOLUTE): 0.2 10*3/uL (ref 0.0–0.4)
Eos: 1 %
Hematocrit: 52.4 % — ABNORMAL HIGH (ref 34.0–46.6)
Hemoglobin: 16.3 g/dL — ABNORMAL HIGH (ref 11.1–15.9)
Immature Grans (Abs): 0.5 10*3/uL — ABNORMAL HIGH (ref 0.0–0.1)
Immature Granulocytes: 4 %
Lymphocytes Absolute: 4.4 10*3/uL — ABNORMAL HIGH (ref 0.7–3.1)
Lymphs: 33 %
MCH: 24.7 pg — ABNORMAL LOW (ref 26.6–33.0)
MCHC: 31.1 g/dL — ABNORMAL LOW (ref 31.5–35.7)
MCV: 80 fL (ref 79–97)
Monocytes Absolute: 2.4 10*3/uL — ABNORMAL HIGH (ref 0.1–0.9)
Monocytes: 18 %
Neutrophils Absolute: 6 10*3/uL (ref 1.4–7.0)
Neutrophils: 44 %
Platelets: 230 10*3/uL (ref 150–450)
RBC: 6.59 x10E6/uL — ABNORMAL HIGH (ref 3.77–5.28)
RDW: 21.8 % — ABNORMAL HIGH (ref 11.7–15.4)
WBC: 13.4 10*3/uL — ABNORMAL HIGH (ref 3.4–10.8)

## 2018-09-13 LAB — VITAMIN D 1,25 DIHYDROXY
Vitamin D 1, 25 (OH)2 Total: 28 pg/mL
Vitamin D2 1, 25 (OH)2: <10 pg/mL
Vitamin D3 1, 25 (OH)2: 25 pg/mL

## 2018-09-13 LAB — T4, FREE: Free T4: 0.95 ng/dL (ref 0.82–1.77)

## 2018-09-13 LAB — TSH: TSH: 9.07 u[IU]/mL — ABNORMAL HIGH (ref 0.450–4.500)

## 2018-09-15 DIAGNOSIS — Z1239 Encounter for other screening for malignant neoplasm of breast: Secondary | ICD-10-CM | POA: Insufficient documentation

## 2018-09-15 DIAGNOSIS — E559 Vitamin D deficiency, unspecified: Secondary | ICD-10-CM | POA: Insufficient documentation

## 2018-09-15 DIAGNOSIS — E039 Hypothyroidism, unspecified: Secondary | ICD-10-CM | POA: Insufficient documentation

## 2018-09-19 ENCOUNTER — Other Ambulatory Visit: Payer: Self-pay | Admitting: Nurse Practitioner

## 2018-09-19 ENCOUNTER — Encounter: Admit: 2018-09-19 | Discharge: 2018-09-20 | Payer: MEDICARE

## 2018-09-19 DIAGNOSIS — R944 Abnormal results of kidney function studies: Secondary | ICD-10-CM | POA: Diagnosis not present

## 2018-09-19 DIAGNOSIS — N939 Abnormal uterine and vaginal bleeding, unspecified: Principal | ICD-10-CM

## 2018-09-20 ENCOUNTER — Encounter: Admit: 2018-09-20 | Discharge: 2018-09-21 | Payer: MEDICARE

## 2018-09-20 DIAGNOSIS — Z1231 Encounter for screening mammogram for malignant neoplasm of breast: Secondary | ICD-10-CM | POA: Diagnosis not present

## 2018-09-20 LAB — COMPREHENSIVE METABOLIC PANEL
ALT: 170 IU/L — ABNORMAL HIGH (ref 0–32)
AST: 97 IU/L — ABNORMAL HIGH (ref 0–40)
Albumin/Globulin Ratio: 2.3 — ABNORMAL HIGH (ref 1.2–2.2)
Albumin: 4.1 g/dL (ref 3.8–4.8)
Alkaline Phosphatase: 464 IU/L — ABNORMAL HIGH (ref 39–117)
BUN/Creatinine Ratio: 9 (ref 9–23)
BUN: 11 mg/dL (ref 6–24)
Bilirubin Total: <0.2 mg/dL (ref 0.0–1.2)
CO2: 16 mmol/L — ABNORMAL LOW (ref 20–29)
Calcium: 9.7 mg/dL (ref 8.7–10.2)
Chloride: 103 mmol/L (ref 96–106)
Creatinine, Ser: 1.28 mg/dL — ABNORMAL HIGH (ref 0.57–1.00)
GFR calc Af Amer: 56 mL/min/{1.73_m2} — ABNORMAL LOW (ref >59)
GFR calc non Af Amer: 49 mL/min/{1.73_m2} — ABNORMAL LOW (ref >59)
Globulin, Total: 1.8 g/dL (ref 1.5–4.5)
Glucose: 147 mg/dL — ABNORMAL HIGH (ref 65–99)
Potassium: 4.4 mmol/L (ref 3.5–5.2)
Sodium: 142 mmol/L (ref 134–144)
Total Protein: 5.9 g/dL — ABNORMAL LOW (ref 6.0–8.5)

## 2018-09-20 LAB — HEPATITIS PANEL, ACUTE
Hep A IgM: NEGATIVE
Hep B C IgM: NEGATIVE
Hep C Virus Ab: <0.1 s/co ratio (ref 0.0–0.9)
Hepatitis B Surface Ag: NEGATIVE

## 2018-09-20 LAB — AMYLASE: Amylase: 32 U/L (ref 31–110)

## 2018-09-20 LAB — LIPASE: Lipase: 23 U/L (ref 14–72)

## 2018-09-25 ENCOUNTER — Other Ambulatory Visit: Payer: Self-pay

## 2018-09-25 DIAGNOSIS — F321 Major depressive disorder, single episode, moderate: Secondary | ICD-10-CM

## 2018-09-25 MED ORDER — BUPROPION HCL ER (SR) 100 MG PO TB12: 100.0000 mg | ORAL_TABLET | Freq: Every day | ORAL | 3 refills | Status: DC

## 2018-10-01 ENCOUNTER — Other Ambulatory Visit: Payer: Self-pay

## 2018-10-01 ENCOUNTER — Ambulatory Visit: Payer: Medicare Other | Admitting: Internal Medicine

## 2018-10-01 ENCOUNTER — Encounter: Payer: Self-pay | Admitting: Internal Medicine

## 2018-10-01 VITALS — BP 126/86 | HR 95 | Resp 16 | Ht 64.0 in | Wt 248.0 lb

## 2018-10-01 DIAGNOSIS — M0579 Rheumatoid arthritis with rheumatoid factor of multiple sites without organ or systems involvement: Secondary | ICD-10-CM

## 2018-10-01 DIAGNOSIS — J449 Chronic obstructive pulmonary disease, unspecified: Secondary | ICD-10-CM | POA: Diagnosis not present

## 2018-10-01 NOTE — Progress Notes (Signed)
[facility] [place], [place] [address]  Pulmonary Sleep Medicine   Office Visit Note  Patient Name: Laura Chen DOB: 17-Aug-1965 MRN [id number]  Date of Service: 10/01/2018  Complaints/HPI: PT is here for pulmonary follow up on copd.  Pt reports she is doing well.  Denies any issues with her breathing at this time.  Overall she is doing well.  She continues to see her Rheumatoid doctor at [facility].  She feels like she is currrently under control.  She does not currently use inhalers for her COPD, and she does not need oxygen currently.   ROS  General: (-) fever, (-) chills, (-) night sweats, (-) weakness Skin: (-) rashes, (-) itching,. Eyes: (-) visual changes, (-) redness, (-) itching. Nose and Sinuses: (-) nasal stuffiness or itchiness, (-) postnasal drip, (-) nosebleeds, (-) sinus trouble. Mouth and Throat: (-) sore throat, (-) hoarseness. Neck: (-) swollen glands, (-) enlarged thyroid, (-) neck pain. Respiratory: - cough, (-) bloody sputum, - shortness of breath, - wheezing. Cardiovascular: - ankle swelling, (-) chest pain. Lymphatic: (-) lymph node enlargement. Neurologic: (-) numbness, (-) tingling. Psychiatric: (-) anxiety, (-) depression   Current Medication: Outpatient Encounter Medications as of 10/01/2018  Medication Sig Note  . ACCU-CHEK FASTCLIX LANCETS MISC USE TO TEST BLOOD SUGAR QD UTD   . ACCU-CHEK GUIDE test strip USE TO CHECK BLOOD GLUCOSE  EVERY DAY AS DIRECTED   . amoxicillin-clavulanate (AUGMENTIN) 875-125 MG tablet Take 1 tablet by mouth 2 (two) times daily.   Marland Kitchen aspirin EC 81 MG tablet Take 81 mg by mouth every evening.    . baclofen (LIORESAL) 10 MG tablet Take 10 mg by mouth 3 (three) times daily.   Marland Kitchen buPROPion (WELLBUTRIN SR) 100 MG 12 hr tablet Take 1 tablet (100 mg total) by mouth daily.   . cetirizine (ZYRTEC) 10 MG tablet Take 10 mg by mouth.   . cholecalciferol (VITAMIN D) 1000 units tablet Take 2,000 Units by mouth daily.    . DULoxetine (CYMBALTA) 20 MG capsule Take 1 capsule (20 mg total) by mouth daily.   . fluconazole (DIFLUCAN) 150 MG tablet Take 1 tablet po once weekly   . fluticasone (FLONASE) 50 MCG/ACT nasal spray USE 1 SPRAY IN BOTH  NOSTRILS DAILY.   Marland Kitchen levothyroxine (SYNTHROID) 50 MCG tablet Take 1 tablet (50 mcg total) by mouth daily.   Marland Kitchen LINZESS 290 MCG CAPS capsule TAKE 1 CAPSULE BY MOUTH  DAILY   . metroNIDAZOLE (METROGEL) 0.75 % vaginal gel Place 1 Applicatorful vaginally 2 (two) times daily.   Marland Kitchen omeprazole (PRILOSEC) 20 MG capsule Take 1 capsule (20 mg total) by mouth daily.   . Oxycodone HCl 10 MG TABS Take 10 mg by mouth 3 (three) times daily as needed (for pain).   . ponatinib HCl (ICLUSIG) 15 MG tablet Take 15 mg by mouth daily.    . predniSONE (DELTASONE) 5 MG tablet Take 5 mg by mouth daily with breakfast.   . rosuvastatin (CRESTOR) 5 MG tablet TAKE 1 TABLET BY MOUTH  DAILY WITH SUPPER   . spironolactone (ALDACTONE) 50 MG tablet daily.   . traMADol (ULTRAM) 50 MG tablet Take 2 tablets (100 mg total) by mouth every 12 (twelve) hours as needed.   . [DISCONTINUED] azaTHIOprine (IMURAN) 50 MG tablet Take 50 mg by mouth daily. 09/06/2018: Reports no longer taking  . [DISCONTINUED] cephALEXin (KEFLEX) 500 MG capsule Take 1 capsule (500 mg total) by mouth 3 (three) times daily. (Patient not taking:  Reported on 09/06/2018)   . [DISCONTINUED] ketoconazole (NIZORAL) 2 % cream Apply 1 application topically daily. (Patient not taking: Reported on 09/06/2018) 09/06/2018: Reports no longer taking  . [DISCONTINUED] levofloxacin (LEVAQUIN) 500 MG tablet Take 1 tablet (500 mg total) by mouth daily. (Patient not taking: Reported on 10/01/2018)   . [DISCONTINUED] nystatin (MYCOSTATIN) 100000 UNIT/ML suspension Take 5 mLs (500,000 Units total) by mouth 4 (four) times daily. (Patient not taking: Reported on 09/06/2018) 09/06/2018: Reports not longer taking  . [DISCONTINUED] phenazopyridine (PYRIDIUM) 200 MG tablet Take 1  tablet (200 mg total) by mouth 3 (three) times daily as needed for pain. (Patient not taking: Reported on 09/06/2018) 09/06/2018: Reports not longer taking  . [DISCONTINUED] potassium chloride SA (K-DUR) 20 MEQ tablet TAKE 1 TABLET BY MOUTH TWO  TIMES DAILY (Patient not taking: Reported on 09/06/2018) 09/06/2018: Reports not longer taking   No facility-administered encounter medications on file as of 10/01/2018.     Surgical History: Past Surgical History:  Procedure Laterality Date  . DEXA  2010   osteopenia  . MYOMECTOMY  2008  . right ankle AFO  2011  . TRANSTHORACIC ECHOCARDIOGRAM  09/1189   nl systolic/diastolic fxn, EF 47%, mild MR, normal PA pressures  . US/HSG  05/2010   possible endometrial polyp, rec rpt 8 wks continue loestrin 24 [facility])    Medical History: Past Medical History:  Diagnosis Date  . Asthma   . CML (chronic myeloid leukemia) ([place]) 2011   (Dr. Alyse Low) stopped gleevec 2/2 side effects  . Diabetes mellitus without complication ([place])   . GERD (gastroesophageal reflux disease)   . History of shingles   . History of syphilis 1990   s/p treatment  . Hypertension   . Leukemia ([place])   . Osteopenia    due to prednisone use, was on reclast  . PUD (peptic ulcer disease)   . Rheumatoid arthritis(714.0) 1990s   Jacoud's arthropathy, previously treated with MTX, TNFa (remicade, enbrel, humira, orencia, rituximab, and actemra)  . SLE (systemic lupus erythematosus) ([place]) 09/2009   Rheum-Dr. Joan Mayans  . Thyroid goiter    Endo-Dr. Eddie Dibbles    Family History: Family History  Problem Relation Age of Onset  . Heart attack Maternal Grandmother   . Coronary artery disease Maternal Grandmother   . Hypertension Mother   . Hypertension Other        Aunt    Social History: Social History   Socioeconomic History  . Marital status: Single    Spouse name: Not on file  . Number of children: Not on file  . Years of education: Not on file  . Highest education  level: Not on file  Occupational History  . Occupation: Secretary/administrator at [employer]  . Financial resource strain: Not hard at all  . Food insecurity    Worry: Never true    Inability: Never true  . Transportation needs    Medical: No    Non-medical: No  Tobacco Use  . Smoking status: Former Smoker    Packs/day: 0.30    Types: Cigarettes  . Smokeless tobacco: Never Used  . Tobacco comment: 6 cigarettes/day  Substance and Sexual Activity  . Alcohol use: No  . Drug use: No  . Sexual activity: Not on file  Lifestyle  . Physical activity    Days per week: 3 days    Minutes per session: 20 min  . Stress: Not on file  Relationships  . Social connections  Talks on phone: Not on file    Gets together: Not on file    Attends religious service: Not on file    Active member of club or organization: Not on file    Attends meetings of clubs or organizations: Not on file    Relationship status: Not on file  . Intimate partner violence    Fear of current or ex partner: No    Emotionally abused: No    Physically abused: No    Forced sexual activity: No  Other Topics Concern  . Not on file  Social History Narrative   Caffeine: 1 cup/day coffee      Lives with mother, 1 dog      HS education    Vital Signs: Blood pressure 126/86, pulse 95, resp. rate 16, height 5\' 4"  (1.626 m), weight 248 lb (112.5 kg), SpO2 98 %.  Examination: General Appearance: The patient is well-developed, well-nourished, and in no distress. Skin: Gross inspection of skin unremarkable. Head: normocephalic, no gross deformities. Eyes: no gross deformities noted. ENT: ears appear grossly normal no exudates. Neck: Supple. No thyromegaly. No LAD. Respiratory: clear bilateraly. Cardiovascular: Normal S1 and S2 without murmur or rub. Extremities: No cyanosis. pulses are equal. Neurologic: Alert and oriented. No involuntary movements.  LABS: Recent Results (from the past 2160 hour(s))   CULTURE, URINE COMPREHENSIVE     Status: Abnormal   Collection Time: 09/05/18 12:00 AM   Specimen: Urine   URINE  Result Value Ref Range   Urine Culture, Comprehensive Final report (A)    Organism ID, Bacteria Enterococcus faecalis (A)     Comment: Greater than 100,000 colony forming units per mL   ANTIMICROBIAL SUSCEPTIBILITY Comment     Comment:       ** S = Susceptible; I = Intermediate; R = Resistant **                    P = Positive; N = Negative             MICS are expressed in micrograms per mL    Antibiotic                 RSLT#1    RSLT#2    RSLT#3    RSLT#4 Ciprofloxacin                  S Levofloxacin                   S Nitrofurantoin                 S Penicillin                     S Tetracycline                   R Vancomycin                     S   POCT HgB A1C     Status: Abnormal   Collection Time: 09/05/18  3:06 PM  Result Value Ref Range   Hemoglobin A1C 6.6 (A) 4.0 - 5.6 %   HbA1c POC (<> result, manual entry)     HbA1c, POC (prediabetic range)     HbA1c, POC (controlled diabetic range)    POCT Urinalysis Dipstick     Status: Abnormal   Collection Time: 09/05/18  3:06 PM  Result Value Ref Range   Color, UA  Clarity, UA     Glucose, UA Negative Negative   Bilirubin, UA negative    Ketones, UA negative    Spec Grav, UA 1.020 1.010 - 1.025   Blood, UA moderate    pH, UA 6.0 5.0 - 8.0   Protein, UA Positive (A) Negative   Urobilinogen, UA 0.2 0.2 or 1.0 E.U./dL   Nitrite, UA negative    Leukocytes, UA Moderate (2+) (A) Negative   Appearance     Odor    POCT Urine Drug Screen     Status: Abnormal   Collection Time: 09/05/18  3:07 PM  Result Value Ref Range   POC METHAMPHETAMINE UR None Detected None Detected   POC Opiate Ur None Detected None Detected   POC Barbiturate UR None Detected None Detected   POC Amphetamine UR None Detected None Detected   POC Oxycodone UR Positive (A) None Detected   POC Cocaine UR None Detected None Detected   POC  Ecstasy UR None Detected None Detected   POC TRICYCLICS UR None Detected None Detected   POC PHENCYCLIDINE UR None Detected None Detected   POC MARIJUANA UR None Detected None Detected   POC METHADONE UR None Detected None Detected   POC BENZODIAZEPINES UR None Detected None Detected   URINE TEMPERATURE     POC DRUG SCREEN OXIDANTS URINE     POC SPECIFIC GRAVITY URINE     POC PH URINE     Methylenedioxyamphetamine    Vitamin D 1,25 dihydroxy     Status: None   Collection Time: 09/09/18  9:03 AM  Result Value Ref Range   Vitamin D 1, 25 (OH)2 Total 28 pg/mL    Comment: Reference Range: Adults: 21 - 65    Vitamin D2 1, 25 (OH)2 <10 pg/mL    Comment: This test was developed and its performance characteristics determined by [facility]. It has not been cleared or approved by the Food and Drug Administration.    Vitamin D3 1, 25 (OH)2 25 pg/mL    Comment: This test was developed and its performance characteristics determined by [facility]. It has not been cleared or approved by the Food and Drug Administration.   Lipid panel     Status: None   Collection Time: 09/09/18  9:03 AM  Result Value Ref Range   Cholesterol, Total 194 100 - 199 mg/dL   Triglycerides 143 0 - 149 mg/dL   HDL 90 >39 mg/dL   VLDL Cholesterol Cal 29 5 - 40 mg/dL   LDL Calculated 75 0 - 99 mg/dL   Chol/HDL Ratio 2.2 0.0 - 4.4 ratio    Comment:                                   T. Chol/HDL Ratio                                             Men  Women                               1/2 Avg.Risk  3.4    3.3  Avg.Risk  5.0    4.4                                2X Avg.Risk  9.6    7.1                                3X Avg.Risk 23.4   11.0   TSH     Status: Abnormal   Collection Time: 09/09/18  9:03 AM  Result Value Ref Range   TSH 9.070 (H) 0.450 - 4.500 uIU/mL  T4, free     Status: None   Collection Time: 09/09/18  9:03 AM  Result Value Ref Range   Free T4 0.95 0.82 - 1.77 ng/dL   Comprehensive metabolic panel     Status: Abnormal   Collection Time: 09/09/18  9:03 AM  Result Value Ref Range   Glucose 82 65 - 99 mg/dL   BUN 16 6 - 24 mg/dL   Creatinine, Ser 1.33 (H) 0.57 - 1.00 mg/dL   GFR calc non Af Amer 47 (L) >59 mL/min/1.73   GFR calc Af Amer 54 (L) >59 mL/min/1.73   BUN/Creatinine Ratio 12 9 - 23   Sodium 138 134 - 144 mmol/L   Potassium 4.1 3.5 - 5.2 mmol/L   Chloride 99 96 - 106 mmol/L   CO2 21 20 - 29 mmol/L   Calcium 9.6 8.7 - 10.2 mg/dL   Total Protein 6.4 6.0 - 8.5 g/dL   Albumin 4.6 3.8 - 4.8 g/dL   Globulin, Total 1.8 1.5 - 4.5 g/dL   Albumin/Globulin Ratio 2.6 (H) 1.2 - 2.2   Bilirubin Total 0.2 0.0 - 1.2 mg/dL   Alkaline Phosphatase 471 (H) 39 - 117 IU/L   AST 173 (H) 0 - 40 IU/L   ALT 219 (H) 0 - 32 IU/L  CBC with Differential/Platelet     Status: Abnormal   Collection Time: 09/09/18  9:03 AM  Result Value Ref Range   WBC 13.4 (H) 3.4 - 10.8 x10E3/uL   RBC 6.59 (H) 3.77 - 5.28 x10E6/uL   Hemoglobin 16.3 (H) 11.1 - 15.9 g/dL   Hematocrit 52.4 (H) 34.0 - 46.6 %   MCV 80 79 - 97 fL   MCH 24.7 (L) 26.6 - 33.0 pg   MCHC 31.1 (L) 31.5 - 35.7 g/dL   RDW 21.8 (H) 11.7 - 15.4 %   Platelets 230 150 - 450 x10E3/uL   Neutrophils 44 Not Estab. %   Lymphs 33 Not Estab. %   Monocytes 18 Not Estab. %   Eos 1 Not Estab. %   Basos 0 Not Estab. %   Neutrophils Absolute 6.0 1.4 - 7.0 x10E3/uL   Lymphocytes Absolute 4.4 (H) 0.7 - 3.1 x10E3/uL   Monocytes Absolute 2.4 (H) 0.1 - 0.9 x10E3/uL   EOS (ABSOLUTE) 0.2 0.0 - 0.4 x10E3/uL   Basophils Absolute 0.0 0.0 - 0.2 x10E3/uL   Immature Granulocytes 4 Not Estab. %   Immature Grans (Abs) 0.5 (H) 0.0 - 0.1 x10E3/uL    Comment: (An elevated percentage of Immature Granulocytes has not been found to be clinically significant as a sole clinical predictor of disease. Does NOT include bands or blast cells.  Pregnancy associated physiological leukocytosis may also show increased immature granulocytes without  clinical significance.)   Comprehensive metabolic panel     Status: Abnormal   Collection  Time: 09/19/18  1:16 PM  Result Value Ref Range   Glucose 147 (H) 65 - 99 mg/dL   BUN 11 6 - 24 mg/dL   Creatinine, Ser 1.28 (H) 0.57 - 1.00 mg/dL   GFR calc non Af Amer 49 (L) >59 mL/min/1.73   GFR calc Af Amer 56 (L) >59 mL/min/1.73   BUN/Creatinine Ratio 9 9 - 23   Sodium 142 134 - 144 mmol/L   Potassium 4.4 3.5 - 5.2 mmol/L   Chloride 103 96 - 106 mmol/L   CO2 16 (L) 20 - 29 mmol/L   Calcium 9.7 8.7 - 10.2 mg/dL   Total Protein 5.9 (L) 6.0 - 8.5 g/dL   Albumin 4.1 3.8 - 4.8 g/dL   Globulin, Total 1.8 1.5 - 4.5 g/dL   Albumin/Globulin Ratio 2.3 (H) 1.2 - 2.2   Bilirubin Total <0.2 0.0 - 1.2 mg/dL   Alkaline Phosphatase 464 (H) 39 - 117 IU/L   AST 97 (H) 0 - 40 IU/L   ALT 170 (H) 0 - 32 IU/L  Hepatitis panel, acute     Status: None   Collection Time: 09/19/18  1:16 PM  Result Value Ref Range   Hep A IgM Negative Negative   Hepatitis B Surface Ag Negative Negative   Hep B C IgM Negative Negative   Hep C Virus Ab <0.1 0.0 - 0.9 s/co ratio    Comment:                                   Negative:     < 0.8                              Indeterminate: 0.8 - 0.9                                   Positive:     > 0.9  The CDC recommends that a positive HCV antibody result  be followed up with a HCV Nucleic Acid Amplification  test (263335).   Amylase     Status: None   Collection Time: 09/19/18  1:16 PM  Result Value Ref Range   Amylase 32 31 - 110 U/L  Lipase     Status: None   Collection Time: 09/19/18  1:16 PM  Result Value Ref Range   Lipase 23 14 - 72 U/L    Radiology: Dg Chest 2 View  Result Date: 06/28/2017 CLINICAL DATA:  Cough and congestion with body aches. History of leukemia. EXAM: CHEST - 2 VIEW COMPARISON:  Chest x-ray dated April 13, 2017. FINDINGS: The heart size and mediastinal contours are within normal limits. Normal pulmonary vascularity. Unchanged eventration of  the anterior right hemidiaphragm with blunting of the right costophrenic angle. Bronchitic changes appear slightly worsened when compared to prior study. Stable scarring/atelectasis at the left lung base. No focal consolidation, pleural effusion, or pneumothorax. No acute osseous abnormality. IMPRESSION: Slightly worsened bronchitic changes.  No consolidation. Electronically Signed   By: Titus Dubin M.D.   On: 06/28/2017 09:35    No results found.  No results found.    Assessment and Plan: Patient Active Problem List   Diagnosis Date Noted  . Screening for breast cancer 09/15/2018  . Acquired hypothyroidism 09/15/2018  . Vitamin D deficiency 09/15/2018  .  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 Iron deficiency anemia 06/16/2018  . Acute vaginitis 02/24/2018  . Hematuria 01/23/2018  . Bacterial vaginitis 01/23/2018  . Uncontrolled type 2 diabetes mellitus with hyperglycemia ([place]) 12/16/2017  . Mixed hyperlipidemia 12/16/2017  . Depression, major, recurrent, moderate ([place]) 12/16/2017  . Urinary tract infection with hematuria 11/11/2017  . Dysuria 11/11/2017  . Vaginal candidiasis 11/11/2017  . Episode of moderate major depression ([place]) 11/11/2017  . Hypertension 08/14/2017  . Hypokalemia 08/14/2017  . Sepsis ([place]) 06/28/2017  . Acute on chronic kidney failure ([place]) 04/14/2017  . (HFpEF) heart failure with preserved ejection fraction ([place]) 12/12/2016  . Other chest pain 12/12/2016  . Pneumonia 04/30/2015  . Acute renal failure (ARF) ([place]) 04/24/2015  . Elevated LFTs 04/24/2015  . Hyponatremia 04/24/2015  . Influenza A 04/24/2015  . Oral candidiasis 04/24/2015  . Pulmonary nodule 08/26/2014  . Iritis 04/30/2013  . Steroid responder, bilateral 04/30/2013  . Hemoptysis 11/02/2012  . Constipation 05/08/2012  . Complete rupture of rotator cuff 09/19/2011  . 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 Effusion of wrist 11/22/2010  . Hand joint pain 11/22/2010  .  Polymenorrhea 10/28/2010  . Anemia 10/28/2010  . External otitis 10/27/2010  . Endometrial polyp 10/27/2010  . Menorrhagia 10/27/2010  . Right flank pain 10/12/2010  . Fatigue 06/23/2010  . Skin rash 06/07/2010  . Edema 06/07/2010  . Tobacco abuse 06/07/2010  . Tobacco dependence syndrome 06/07/2010  . Primary localized osteoarthrosis, lower leg 05/11/2010  . Chronic myeloid leukemia ([place]) 03/24/2010  . ASTHMA 03/24/2010  . GERD 03/24/2010  . SYSTEMIC LUPUS ERYTHEMATOSUS 03/24/2010  . Rheumatoid arthritis ([place]) 03/24/2010  . FLATULENCE ERUCTATION AND GAS PAIN 03/24/2010  . Asthma 03/24/2010    1. Chronic bronchitis with COPD (chronic obstructive pulmonary disease) ([place]) Will get PFT to evaluate current function. Doing well at this time.  Continue current management.  - Pulmonary Function Test; Future  2. Rheumatoid arthritis involving multiple sites with positive rheumatoid factor ([place]) Continue to follow up with RA doctor.  Current doing well.  Continue present thearpy.   3. Morbid obesity ([place]) Obesity Counseling: Risk Assessment: An assessment of behavioral risk factors was made today and includes lack of exercise sedentary lifestyle, lack of portion control and poor dietary habits.  Risk Modification Advice: She was counseled on portion control guidelines. Restricting daily caloric intake to. . The detrimental long term effects of obesity on her health and ongoing poor compliance was also discussed with the patient.    General Counseling: I have discussed the findings of the evaluation and examination with Laura Chen.  I have also discussed any further diagnostic evaluation thatmay be needed or ordered today. Laura Chen verbalizes understanding of the findings of todays visit. We also reviewed her medications today and discussed drug interactions and side effects including but not limited excessive drowsiness and altered mental states. We also discussed that there is always a risk not  just to her but also people around her. she has been encouraged to call the office with any questions or concerns that should arise related to todays visit.    Time spent: 15 This patient was seen by Orson Gear AGNP-C in Collaboration with Dr. Devona Konig as a part of collaborative care agreement.   I have personally obtained a history, examined the patient, evaluated laboratory and imaging results, formulated the assessment and plan and placed orders.    Allyne Gee, MD [facility] Pulmonary and Critical Care Sleep medicine

## 2018-10-02 MED ORDER — NORETHINDRONE (CONTRACEPTIVE) 0.35 MG TABLET
ORAL_TABLET | Freq: Every day | ORAL | 1 refills | 84.00000 days | Status: CP
Start: 2018-10-02 — End: 2019-10-02

## 2018-10-02 NOTE — Progress Notes (Signed)
Improving renal and liver functions. Continue to monitor.

## 2018-10-03 MED ORDER — OXYCODONE 10 MG TABLET
ORAL_TABLET | Freq: Three times a day (TID) | ORAL | 0 refills | 30 days | Status: CP | PRN
Start: 2018-10-03 — End: ?

## 2018-10-07 ENCOUNTER — Other Ambulatory Visit: Payer: Self-pay | Admitting: *Deleted

## 2018-10-07 ENCOUNTER — Encounter: Payer: Self-pay | Admitting: *Deleted

## 2018-10-07 NOTE — Patient Outreach (Signed)
[facility]) Care Management  10/07/2018  Laura Chen Aug 13, 1965 [id number]   RN Health Coach Monthly Outreach  Referral Date:08/09/2018 Referral Source:[employer] High Risk List Reason for Referral:Assess Needs Insurance:[employer] Medicare   Outreach Attempt:  Successful telephone outreach to patient for follow up.  HIPAA verified with patient.  Patient reporting she is doing well.  States she is now starting to feel some relief from her yearlong urinary tract infection and bacterial infection since weaning of her chemotherapy drug.  Does state her thyroid levels are abnormal and she has started synthroid.  Fasting blood sugar this morning was 91 with fasting ranges of 80-90's.  Denies any falls or issues at this time.  Appointments:  Attended appointment with primary care on 10/01/2018 and has follow up appointment 10/16/2018.  Hematology appointment on 10/17/2018 and Rheumatology appointment on 01/06/2019.  Plan:  RN Health Coach will make next telephone outreach to patient within the month of October.   [place] [phone] [email or web address] [email or web address]

## 2018-10-14 MED ORDER — SPIRONOLACTONE 50 MG TABLET
ORAL_TABLET | Freq: Every day | ORAL | 0 refills | 90.00000 days | Status: CP
Start: 2018-10-14 — End: 2019-10-14

## 2018-10-14 MED ORDER — SPIRONOLACTONE 50 MG TABLET: 50 mg | tablet | Freq: Every day | 0 refills | 90 days | Status: AC

## 2018-10-16 ENCOUNTER — Other Ambulatory Visit: Payer: Self-pay

## 2018-10-16 ENCOUNTER — Ambulatory Visit: Payer: Medicare Other | Admitting: Internal Medicine

## 2018-10-16 DIAGNOSIS — R0602 Shortness of breath: Secondary | ICD-10-CM

## 2018-10-16 LAB — PULMONARY FUNCTION TEST

## 2018-10-17 ENCOUNTER — Encounter: Admit: 2018-10-17 | Discharge: 2018-10-17 | Payer: MEDICARE

## 2018-10-17 ENCOUNTER — Ambulatory Visit: Admit: 2018-10-17 | Discharge: 2018-10-17 | Payer: MEDICARE

## 2018-10-17 ENCOUNTER — Encounter
Admit: 2018-10-17 | Discharge: 2018-10-17 | Payer: MEDICARE | Attending: Hematology & Oncology | Primary: Hematology & Oncology

## 2018-10-17 DIAGNOSIS — R9431 Abnormal electrocardiogram [ECG] [EKG]: Secondary | ICD-10-CM | POA: Diagnosis not present

## 2018-10-17 DIAGNOSIS — C9210 Chronic myeloid leukemia, BCR/ABL-positive, not having achieved remission: Secondary | ICD-10-CM | POA: Diagnosis not present

## 2018-10-17 DIAGNOSIS — M069 Rheumatoid arthritis, unspecified: Secondary | ICD-10-CM

## 2018-10-17 DIAGNOSIS — M059 Rheumatoid arthritis with rheumatoid factor, unspecified: Principal | ICD-10-CM

## 2018-10-17 DIAGNOSIS — D509 Iron deficiency anemia, unspecified: Secondary | ICD-10-CM

## 2018-10-17 MED ORDER — NILOTINIB 150 MG CAPSULE
ORAL_CAPSULE | Freq: Two times a day (BID) | ORAL | 6 refills | 28.00000 days | Status: CP
Start: 2018-10-17 — End: 2018-11-19
  Filled 2018-10-22: qty 112, 28d supply, fill #0

## 2018-10-17 NOTE — Unmapped (Signed)
ID: Haley Mccullough is a 53 year old African-American female with a history of cpCML and RA/SLE Overlap Syndrome    ASSESSMENT:  Haley Mccullough is 53 African-American female with a history of chronic phase CML and overlap syndrome involving rheumatoid arthritis and SLE. She has struggled with side effects of her TKI therapy.  Is not entirely clear to me if her current set of symptoms are related to ponatinib.  I save this given the length of time that she has been on the drug and the relatively low dose of that drug.  The reduction in her ponatinib has not resulted in profound improvement in her symptoms, the most important of which is fatigue.  It is unfortunate that we are making a change given her response to therapy.    She has been treated with nilotinib in the past.  The drug was poorly tolerated. However, it was given when she had a substantial amount of disease.  This is not the case at this time and therefore she may be more successful.  While I am not convinced that it will make a substantial impact on her symptomatology, I do feel that it is a safer drug than ponatinib given the latter's association with thrombotic disease.      I do not have a good explanation for her fatigue.  She underwent iron replacement without any significant improvement.  There may be an element of depression though having a hard time diagnosing that problem.  It would be easy to blame her rheumatologic disease though there are not many objective findings that this problem is substantially worse than it was a 6 months ago.    PLAN:  1) Nilotinib: Start with 1 tablet (150 mg) 2 times a day for 3 weeks.  If tolerated, increase to 2 tablets.   2) Labs in 3 weeks: CBC, CMP, Lipase; FU with pharmacy   3) RTC in 6 weeks to see me.   4) Take Betaine (1200 mg) two tablets 5 minutes before nilotinib; take on an empty stomach  5) If not tolerated, return to ponatinib     CANCER HX:   Pre CML therapy for  MCTD:   ?? 1990's: Plaquenil  ?? 2004 - 10: MTX ?? 2004 - 06: Humira  ?? 10/06, 6/07: Rituxan  ?? 2008 - 09: Remicade2011: Dxed with cp CML; presented with thromb  ?? 2009 - 10: Orencia  ?? 2010: Actemra  03/2010: CML  ?? Dx confirmed with BCR-ABL testing  ?? Began Tasigna leading to anorexia, nausea, and anasarca; her weight went from 205 to 230  ?? Tasigna was DC'd and she improved  ?? Her B2 A2 transcript was 20.65%; B3 A2 was 30.83%.   ?? Two attempts were made with bosutinib; this was also intolerable   ?? Also treated with Simponi and Cellcept 2011     04/2010: Began Gleevec at standard dosing; she had severe swelling, anorexia, nausea and worsening joint and bone pain. Attempts were made at smaller doses without improvement    12/2010: Began Sprycel; tolerated it poorly (weakness, swelling) even at 50 mg a day     2013: Treated with Benlysta for MCTD  04/2011: Her B2 A2  = 0.886%;  B3 A2 = 5.3%.  Her weight peaked at 258 (from 240); Sprycel was dc'd    07/2011: Her B2 A2 transcript to 2.581% with B3 A2 transcripts of 20.064%    10/2011: Began ponatinib (15 mg QD) and tolerated it well;   12/2011: DC'd ponatinib  due to reports increased vascular events.    01/2012: B2A2 16.6%; B3A2 24.0%  02/2012: Restarted ponatinib; tolerated it well  04/2012: B2A2 0.6%; B3A2 1.5%  06/2012 P210 = 3.250  09/2012: P210 = 2.599%; pulmonary bx revealing RA nodules.    12/2012: Received 2 doses Rituxan  03/2013: P210 = 0.071%  07/2013: Received 2 doses Rituxan  03/2014: P210 = 0.017% (Received 2 doses Rituxan)  09/2014: P210 = 0.006% (Received 2 doses Rituxan - prednisone of 5 mg)   02/2015: P210 = 0.005%  03/2015 (1 dose Rituxan)  06/2015: P210 = 0.001%  09/2015: P210 = 0.022% (Received 2 doses Rituxan)    -- She begins to gain weight --  10/2015:  P210 = 0.002%  12/2015: She begins Provera.    01/2016: P210 undetectable (also received 2 doses of Rituxan)   06/2016: P210 = 0.006%; ponatinib is stopped; Echo: Nl LV, RV fxn; no sign TR; nl valves; nl liver biopsy  08/2016: P210 = 0.727%; Attempted ponatinib TIW (also poorly tolerated)   09/2016 P210 = 0.558%; Restarted ponatinib TIW (w/o Provera); Myeloid mutation panel was negative   10/2016: P210 = 0.261%; ponatinib increased to 15 mg QD  11/2016: P210 = 0.29%  12/2016: P210 = 0.006%  02/2017: P210 = 0.004%  08/2017: P210 = 0.003%  03/2018: P210 = 0.053%; ponatinib increased to 30 mg QD; this was not tolerated and she returned to 15 mg QD  05/2018: P210 = 0.067%  09/09/18: P210 = 0.007%; ponatinib 15 mg QOD (to transition to nilotinib)  10/16/18: Began Nilotinib 150 mg BID    IRON DEFICIENCY  ANEMIA:  09/2012: Feraheme: 1020 mg IV; h/o aspirin use  03/2016: Began aspirin   03/20/19: Colonoscopy  05/2018: Ferritin 15.2; hgb 13.5;   08/04/18: Feraheme: 510 mg  IV  10/17/18: Ferritin 81    INTERVAL HX:  Haley Mccullough comes for follow up of her CML.  She has had concerns that her ponatinib has been contributing to her symptoms.  She lowered her ponatinib to 15 mg QOD for the past three weeks. We discussed the following sx:  ?? Fatigue: This is her most bothersome symptom.  She feels that her energy level is between 30 and 50% of normal.  She feels it has definitely worsened over the past several months.  She is sleeping 12 to 16hours a night since May.  She can be doing activities for approximately 2 to 3 hours in the day and then she feels drained.  These activities are generally simple chores.  She thinks that stopping her ponatinib has been marginally helpful.  She has undergone testing for obstructive sleep apnea and was found not to have it.  ?? BV: She has undergone several therapies for bacterial vaginosis.  This symptom has improved significantly with these treatments.  She also notes no significant cramping.  She was started on 35 mcg of norethindrone which has been helpful for her vaginal bleeding.  ?? Pain: She notes pain in multiple joints.  She is taking oxycodone 3 times a day and tramadol.  She has not been taking baclofen.  She feels that the symptoms have been worse over the past several weeks to months.  Stopping the ponatinib has had no significant impact on this symptom.  She did feel she was having noncardiac chest pain on the higher dose of ponatinib.  This problem has become less frequent and less bothersome on the lower dose of ponatinib.Marland Kitchen  ?? Mental Health: She notes decreased interest in  doing activities and spending time with others.  She does not feel that this is related to depression, but it is a manifestation of her fatigue.    PHYSICAL EXAM:  VS: As recorded above  GENERAL: Haley. Mccullough appears much as he has in the past.  HEENT: Conjunctiva and sclera are clear; oropharynx unremarkable  LYMPH NODES: She has no significant submandibular cervical supraclavicular lymphadenopathy  LUNGS: Diminished breath sounds; there is a degree of limitation of inspiration; there is no pain noted with palpation over the sternum or ribs  COR: Distant S1-S2 without significant murmurs rubs or gallops  ABD: Soft nontender nondistended without hepatosplenomegaly  EXT: Chronic changes associated with her arthritis without active synovitis

## 2018-10-17 NOTE — Unmapped (Signed)
Venipuncture left hand, labs drawn and sent.  To next appt.

## 2018-10-17 NOTE — Procedures (Signed)
[place], [id number]  DATE OF SERVICE: October 16, 2018  Complete Pulmonary Function Testing Interpretation:  FINDINGS:  Forced vital capacity is normal.  The FEV1 is normal.  The FEV1 FVC ratio is normal.  Postbronchodilator no significant change in FEV1 clinical improvement may still occur however and does not preclude the use of bronchodilators.  Total lung capacity is normal.  Residual volume is decreased residual in total lung capacity ratio is decreased thoracic gas volume is within normal limits.  DLCO was mildly decreased.  IMPRESSION:  This pulmonary function study is within normal limits.  DLCO was mildly decreased clinical correlation is recommended.  Allyne Gee, MD [facility] Pulmonary Critical Care Medicine Sleep Medicine

## 2018-10-18 ENCOUNTER — Other Ambulatory Visit: Payer: Self-pay | Admitting: Internal Medicine

## 2018-10-18 DIAGNOSIS — E119 Type 2 diabetes mellitus without complications: Secondary | ICD-10-CM

## 2018-10-18 NOTE — Unmapped (Signed)
[facility] Medication Referral: PA Approved      Medication (Brand/Generic): TASIGNA    Final Test Claim completed with resulted information below:    Patient ABLE to fill at [facility]  Insurance Company:  [employer] PART D  Anticipated Copay: $0  Is anticipated copay with a copay card or grant? No, there is no need for grant or copay assistance.     Does this patient have to receive a partial fill of the medication due to insurance restrictions? NO  If so, please cofirm how many days supply is allowed per plan per fill and how long the patient will have to fill partial months supply for the medication: NOT APPLICABLE     If the copay is under the $25 defined limit, per policy there will be no further investigation of need for financial assistance at this time unless patient requests. This referral has been communicated to the provider and handed off to the [facility] team for further processing and filling of prescribed medication.   ______________________________________________________________________  Please utilize this referral for viewing purposes as it will serve as the central location for all relevant documentation and updates.

## 2018-10-19 NOTE — Unmapped (Signed)
I just wanted to be clear on the plan and follow up for Ms Cozby.  I am transitioning her to nilotinib. She thinks she is having too many side effects from ponatinib.  I am a tad skeptical, but I am willing to make the change.     1) Nilotinib: Start with 1 tablet (150 mg) 2 times a day for 3 weeks.  If tolerated, increase to 2 tablets.   2) Take Betaine (1200 mg) two tablets 5 minutes before nilotinib; take on an empty stomach  3) Labs in 3 weeks: CBC, CMP, Lipase;   4) FU with Katie if possible in 3 weeks.   5) RTC in 6 weeks to see me.   6) If not tolerated, return to ponatinib

## 2018-10-21 ENCOUNTER — Other Ambulatory Visit: Payer: Self-pay | Admitting: Internal Medicine

## 2018-10-21 ENCOUNTER — Other Ambulatory Visit: Payer: Self-pay

## 2018-10-21 DIAGNOSIS — E1165 Type 2 diabetes mellitus with hyperglycemia: Secondary | ICD-10-CM

## 2018-10-21 DIAGNOSIS — F331 Major depressive disorder, recurrent, moderate: Secondary | ICD-10-CM

## 2018-10-21 DIAGNOSIS — E119 Type 2 diabetes mellitus without complications: Secondary | ICD-10-CM

## 2018-10-21 DIAGNOSIS — R141 Gas pain: Secondary | ICD-10-CM

## 2018-10-21 DIAGNOSIS — R143 Flatulence: Secondary | ICD-10-CM

## 2018-10-21 MED ORDER — DULOXETINE HCL 20 MG PO CPEP: 20.0000 mg | ORAL_CAPSULE | Freq: Every day | ORAL | 1 refills | Status: DC

## 2018-10-21 MED ORDER — FLUTICASONE PROPIONATE 50 MCG/ACT NA SUSP: NASAL | 1 refills | Status: DC

## 2018-10-21 MED ORDER — ACCU-CHEK FASTCLIX LANCETS MISC: 3 refills | Status: DC

## 2018-10-21 MED ORDER — OMEPRAZOLE 20 MG PO CPDR: 20.0000 mg | DELAYED_RELEASE_CAPSULE | Freq: Every day | ORAL | 1 refills | Status: DC

## 2018-10-21 MED ORDER — ACCU-CHEK GUIDE VI STRP: ORAL_STRIP | 1 refills | Status: DC

## 2018-10-21 NOTE — Unmapped (Signed)
[facility]   Patient Onboarding/Medication Counseling    Haley Mccullough is a 53 y.o. female with CML who I am counseling today on initiation of therapy.  I am speaking to the patient.    Verified patient's date of birth / HIPAA.    Specialty medication(s) to be sent: Hematology/Oncology: Tasigna      Non-specialty medications/supplies to be sent: n/a      Medications not needed at this time: n/a         Tasigna (nilotinib)    Medication & Administration     Dosage: Start with 1 tablet (150 mg) 2 times a day for 3 weeks.  If tolerated, increase to 2 tablets.     Administration:   Take on an empty stomach. Take 1 hour before or 2 hours after meals.  Take with a full glass of water.  Swallow whole. Do not chew, break, or crush.Capsule may be opened and contents sprinkled on 1 teaspoon of applesauce. Swallow within 15 minutes after mixing. Do not store for future use.    Adherence/Missed dose instructions: Skip the missed dose and go back to your normal time.  Do not take 2 doses at the same time or extra doses.    Goals of Therapy     To slow disease progression    Side Effects & Monitoring Parameters   Commonly reported side effects:     ???Constipation, diarrhea, stomach pain, upset stomach, throwing up, or feeling less hungry.  ???Back, muscle, or joint pain, headache  ???Hair loss.  ???Trouble sleeping.  ???Signs of a common cold.  ???Nose and throat irritation.  ???Feeling tired or weak.  ???Itching.  ???Night sweats.  ???Dry skin.  ???Muscle spasm.    The following side effects should be reported to the provider:  ? Signs of an allergic reaction, like rash; hives; itching; red, swollen, blistered, or peeling skin with or without fever; wheezing; tightness in the chest or throat; trouble breathing, swallowing, or talking; unusual hoarseness; or swelling of the mouth, face, lips, tongue, or throat.  ? Signs of infection like fever, chills, very bad sore throat, ear or sinus pain, cough, more sputum or change in color of sputum, pain with passing urine, mouth sores, or wound that will not heal.  ? Signs of a pancreas problem (pancreatitis) like very bad stomach pain, very bad back pain, or very bad upset stomach or throwing up.  ? Signs of bleeding like throwing up or coughing up blood; vomit that looks like coffee grounds; blood in the urine; black, red, or tarry stools; bleeding from the gums; abnormal vaginal bleeding; bruises without a cause or that get bigger; or bleeding you cannot stop.  ? Signs of electrolyte problems like mood changes, confusion, muscle pain or weakness, a heartbeat that does not feel normal, seizures, not hungry, or very bad upset stomach or throwing up.  ? Signs of high blood pressure like very bad headache or dizziness, passing out, or change in eyesight.  ? Signs of high blood sugar like confusion, feeling sleepy, more thirst, more hungry, passing urine more often, flushing, fast breathing, or breath that smells like fruit.  ? Weakness on 1 side of the body, trouble speaking or thinking, change in balance, drooping on one side of the face, or blurred eyesight.  ? Chest pain or pressure or passing out.  ? Fast or abnormal heartbeat.  ? Dizziness.  ? Shortness of breath, a big weight gain, or swelling in the arms  or legs.  ? Change in eyesight.  ? Leg pain, leg feels cold, or change in skin color of the leg.      Contraindications, Warnings, & Precautions     ? Contraindications: Hypokalemia, hypomagnesemia, or long QT syndrome    ? Warnings/Precautions    ??? Bone marrow suppression: Reversible myelosuppression, including grades 3 and 4 thrombocytopenia, neutropenia, and anemia may occur.   ??? Cardiovascular: Cardiovascular events such as ischemic heart disease-related events, arterial vascular occlusive events, peripheral arterial occlusive disease, and ischemic cerebrovascular accident have been reported.    ??? Electrolyte imbalance: Electrolyte abnormalities may occur during treatment, including hypophosphatemia, hyper-/hypokalemia, hypocalcemia, and hyponatremia.   ??? Fluid retention: Fluid retention, including pleural and pericardial effusions, ascites, and pulmonary edema, were reported; may be severe.    ??? Hemorrhage: Serious hemorrhagic events (including fatal events) have occurred in chronic myelogenous leukemia (CML) patients treated with nilotinib.    ??? Hepatotoxicity: Nilotinib may cause hepatotoxicity, including elevations in bilirubin, transaminases, and alkaline phosphatase; grade 3 or 4 bilirubin, AST, and ALT elevations     were observed more frequently in pediatric versus adult patients.    ??? QT prolongation/sudden death: [US Boxed Warning]: May prolong the QT interval; sudden deaths have been reported. Use in patients with hypokalemia, hypomagnesemia, or             long QT syndrome is contraindicated. Correct hypomagnesemia and hypokalemia prior to initiating therapy; monitor electrolytes periodically. Monitor ECG and QTc (baseline,            at 7 days, with dose change, and periodically). Avoid the use of QT-prolonging agents and strong CYP3A4 inhibitors; also avoid concurrent use with antiarrhythmics; may              increase the risk of potentially fatal arrhythmias. The sudden deaths reported appear to be related to dose-dependent ventricular repolarization abnormalities. Prolonged QT             interval may result in torsade de pointes, which may cause syncope, seizure, and/or death. Patients with uncontrolled or significant cardiovascular disease were excluded             from studies.         ??? Tumor lysis syndrome: Tumor lysis syndrome (TLS) has been reported in patients with resistant or intolerant CML; the majority of cases had malignant disease progression,              high WBC counts, and/or dehydration. Maintain adequate hydration and treat high uric acid levels prior to nilotinib initiation.         ??? Gastrectomy: Consider alternative therapy or a dosage increase (with more frequent monitoring) in patients with total gastrectomy (nilotinib exposure is reduced).         ??? Hepatic impairment: Dosage reduction is recommended in patients with hepatic impairment, along with close monitoring of QT interval. Nilotinib metabolism is primarily             hepatic; exposure is increased in patients with hepatic impairment.         ???  Pancreatitis: Use with caution in patients with a history of pancreatitis, may cause dose-limiting elevations of serum lipase and amylase; monitor. In patients with abdominal             symptoms in conjunction with lipase increases, withhold treatment and consider diagnostics to exclude pancreatitis. Monitor serum lipase levels monthly or as clinically          ???  Drug-drug/drug-food interactions:  [US Boxed Warning]: Avoid concurrent use with medications known to prolong the QT interval and with strong CYP3A4 inhibitors.         ??? Pediatrics: Growth retardation has been reported in pediatric patients with Ph+ CML in chronic phase. Monitor bone growth/development in pediatric patients.         ??? Lactose: Capsules contain lactose; do not use with galactose intolerance, severe lactase deficiency, or glucose-galactose malabsorption syndromes.         ??? Appropriate administration: [US Boxed Warning]: Administer on an empty stomach, at least 1 hour before and 2 hours after food. Food increased the bioavailability/serum levels             which may then prolong QTc. Nilotinib solubility is decreased at higher pH; concurrent use with proton pump inhibitors is not recommended. If necessary, H2-receptor blockers             may be administered ~10 hours before and 2 hours after a nilotinib dose. Antacids (eg, aluminum hydroxide, magnesium hydroxide, simethicone) may be administered ~2             hours before or 2 hours after nilotinib.    Verify pregnancy status in females of reproductive potential prior to initiating therapy. Women of childbearing potential should be advised to use effective contraception during treatment and for at least 14 days after the last dose.    Drug/Food Interactions     ? Medication list reviewed in Epic. The patient was instructed to inform the care team before taking any new medications or supplements. Omeprazole use will be discontinued. Fluconazole and ondansetron may enhance the QTC longation effet of nilotinib.  Monitoring is recommended..   ? Avoid grapefruit and grapefruit juice they can increase nilotinib concentrations.    Storage, Handling Precautions, & Disposal     ? Store at room temperature in the original container (do not use a pillbox or store with other medications).   ? Caregivers helping administer medication should wear gloves and wash hands immediately after.    ? Keep the lid tightly closed. Keep out of the reach of children and pets.  ? Do not flush down a toilet or pour down a drain unless instructed to do so.  Check with your local police department or fire station about drug take-back programs in your area.       Current Medications (including OTC/herbals), Comorbidities and Allergies     Current Outpatient Medications   Medication Sig Dispense Refill   ??? ACCU-CHEK FASTCLIX Misc      ??? ACCU-CHEK SMARTVIEW TEST STRIP Strp      ??? aspirin (ADULT LOW DOSE ASPIRIN) 81 MG tablet Take 81 mg by mouth daily.     ??? baclofen (LIORESAL) 10 MG tablet TAKE 1 TABLET BY MOUTH 3  TIMES A DAY 270 tablet 2   ??? buPROPion (WELLBUTRIN SR) 100 MG 12 hr tablet Take 100 mg by mouth.     ??? calcium-vitamin D 500 mg(1,250mg ) -200 unit per tablet Take 1 tablet by mouth 2 (two) times a day with meals.     ??? cetirizine (ZYRTEC) 10 MG tablet Take 10 mg by mouth daily.     ??? DULoxetine (CYMBALTA) 60 MG capsule Take 1 capsule (60 mg total) by mouth daily. (Patient taking differently: Take 20 mg by mouth daily. ) 90 capsule 0   ??? fluconazole (DIFLUCAN) 150 MG tablet TK 1 T PO ONCE WEEKLY     ??? fluticasone (  FLONASE) 50 mcg/actuation nasal spray 2 sprays daily. (Patient taking differently: 2 sprays by Each Nare route daily as needed. ) 32 g 6   ??? LINZESS 290 mcg capsule Take by mouth nightly.      ??? nilotinib (TASIGNA) 150 mg cap Take 2 capsules (300 mg total) by mouth Two (2) times a day. Take 1 hour before or 2 hours after food 120 capsule 6   ??? norethindrone (ORTHO MICRONOR) 0.35 mg tablet Take 1 tablet by mouth daily. 84 tablet 1   ??? omeprazole (PRILOSEC) 20 MG capsule Take 20 mg by mouth daily.   3   ??? ondansetron (ZOFRAN) 8 MG tablet every eight (8) hours as needed.      ??? oxyCODONE (ROXICODONE) 10 mg immediate release tablet Take 1 tablet (10 mg total) by mouth every eight (8) hours as needed for pain. 90 tablet 0   ??? PONATinib (ICLUSIG) 15 mg Tab Take 2 tablets (30 mg total) by mouth daily. 60 tablet 11   ??? predniSONE (DELTASONE) 5 MG tablet Take 1 tablet (5 mg total) by mouth daily. 90 tablet 3   ??? rosuvastatin (CRESTOR) 5 MG tablet TK 1 T PO QD WITH SUPPER     ??? spironolactone (ALDACTONE) 50 MG tablet TAKE 1 TABLET(50 MG) BY MOUTH DAILY 90 tablet 3   ??? spironolactone (ALDACTONE) 50 MG tablet Take 1 tablet (50 mg total) by mouth daily. 90 tablet 0   ??? traMADol (ULTRAM) 50 mg tablet Take 100 mg by mouth three (3) times a day (at 6am, noon and 6pm).   2     No current facility-administered medications for this visit.        Allergies   Allergen Reactions   ??? Ibuprofen Swelling   ??? Lasix [Furosemide]      rash   ??? Adhesive Tape-Silicones Itching and Rash   ??? Sulfa (Sulfonamide Antibiotics) Rash     Other reaction(s): RASH   ??? Thiazides Rash     Other reaction(s): RASH       Patient Active Problem List   Diagnosis   ??? Systemic lupus erythematosus (CMS-HCC)   ??? Rheumatoid arthritis (CMS-HCC)   ??? CML (chronic myelocytic leukemia) (CMS-HCC)   ??? Boutonniere deformity   ??? Chronic myeloid leukemia (CMS-HCC)   ??? Complete rupture of rotator cuff   ??? Constipation   ??? Effusion of forearm joint   ??? Esophageal reflux   ??? Primary localized osteoarthrosis, lower leg   ??? Other tenosynovitis of hand and wrist   ??? Pain in joint, hand   ??? Hemoptysis   ??? Rhinitis   ??? Iritis   ??? Steroid responder, bilateral   ??? Anemia   ??? Asthma   ??? Edema   ??? Polyp of corpus uteri   ??? Otitis externa   ??? Fatigue   ??? Flatulence, eructation and gas pain   ??? Gastroesophageal reflux disease   ??? Menorrhagia   ??? Polymenorrhea   ??? Right flank pain   ??? Rash   ??? Tobacco dependence syndrome   ??? Seropositive rheumatoid arthritis (CMS-HCC)   ??? Pulmonary nodule   ??? Influenza A   ??? Oral thrush   ??? Elevated LFTs   ??? Acute renal failure (ARF) (CMS-HCC)   ??? Hyponatremia   ??? (HFpEF) heart failure with preserved ejection fraction (CMS-HCC)   ??? Other chest pain   ??? Hypertension   ??? Hypokalemia   ??? Iron deficiency anemia  Reviewed and up to date in Epic.    Appropriateness of Therapy     Is medication and dose appropriate based on diagnosis? Yes    Baseline Quality of Life Assessment      How many days over the past month did your condition keep you from your normal activities? 0    Financial Information     Medication Assistance provided: Prior Authorization    Anticipated copay of $0 reviewed with patient. Verified delivery address.    Delivery Information     Scheduled delivery date: 10/23/18    Expected start date: 10/24/18    Medication will be delivered via UPS to the home address in [facility].  This shipment will not require a signature.      Explained the services we provide at [facility] and that each month we would call to set up refills.  Stressed importance of returning phone calls so that we could ensure they receive their medications in time each month.  Informed patient that we should be setting up refills 7-10 days prior to when they will run out of medication.  A pharmacist will reach out to perform a clinical assessment periodically.  Informed patient that a welcome packet and a drug information handout will be sent.      Patient verbalized understanding of the above information as well as how to contact the pharmacy at [phone] option 4 with any questions/concerns.  The pharmacy is open Monday through Friday 8:30am-4:30pm.  A pharmacist is available 24/7 via pager to answer any clinical questions they may have.    Patient Specific Needs     ? Does the patient have any physical, cognitive, or cultural barriers? No    ? Patient prefers to have medications discussed with  Patient     ? Is the patient able to read and understand education materials at a high school level or above? Yes    ? Patient's primary language is  English     ? Is the patient high risk? No     ? Does the patient require a Care Management Plan? No     ? Does the patient require physician intervention or other additional services (i.e. nutrition, smoking cessation, social work)? No      Donato Studley  Anders Grant  [facility] Specialty Pharmacist

## 2018-10-22 DIAGNOSIS — M059 Rheumatoid arthritis with rheumatoid factor, unspecified: Secondary | ICD-10-CM

## 2018-10-22 MED ORDER — PREDNISONE 5 MG TABLET
ORAL_TABLET | Freq: Every day | ORAL | 3 refills | 90.00000 days | Status: CP
Start: 2018-10-22 — End: ?

## 2018-10-22 MED FILL — TASIGNA 150 MG CAPSULE: 28 days supply | Qty: 112 | Fill #0 | Status: AC

## 2018-10-22 NOTE — Unmapped (Addendum)
I called patient and discussed the plan with her laid out by Dr. Leotis Pain. She has stopped Iclusig due to concern for increased clotting risk while on an estrogen containing OCP, and will instead replace this with Tasigna. Med rec completed. No recent amylase and lipase as baseline. EKG WNL. The following items were discussed:    - Stop ponatinib (did this after visit with Dr. Zenaida Niece)  - While off ponatinib, can stop aspirin. She may be able to go off baclofen as well with reduced chest wall pain from the ponatinib.  - Will start nilotinib 150 mg BID when she gets this on Wednesday or Thursday of this week ($0 from [facility] Should take on empty stomach.   - Can continue omeprazole BUT must take Betaine 2 capsules (~1200 mg) 5 minutes prior to each dose of nilotinib to help with absorption. She was agreeable to this plan and the Betaine will arrive Thursday. (Can disregard the option I spelled out in my MyChart message to her - although being off a PPI is ideal, it sounds like [name] does not get benefit from H2 blockers for her heart burn)  - She should follow-up with a phone call with me in about 3 weeks to discuss increasing Tasigna to 300 mg BID. She mentions that Dr. Zenaida Niece told her she could go back to ponatinib if she didn't tolerate Tasigna.      Manfred Arch, PharmD, BCOP, CPP  Pager: [phone]

## 2018-10-22 NOTE — Unmapped (Signed)
Sulin,     Dr. Zenaida Niece is on service and one slot left on 10/8. How do you want to proceed with scheduling?    Thanks

## 2018-10-22 NOTE — Unmapped (Signed)
prednisone refill  Last ov: 06/24/2018  Next ov: 01/06/2019

## 2018-10-23 NOTE — Unmapped (Signed)
Lab appt canceled for tomorrow     Thanks

## 2018-10-23 NOTE — Unmapped (Signed)
Patient scheduled and confirmed appt times     Thanks

## 2018-10-23 NOTE — Unmapped (Signed)
I spoke with patient Haley Mccullough to confirm appointments on the following date(s): all scheduled appt confirmed by patient    Samella Parr

## 2018-10-30 DIAGNOSIS — C9210 Chronic myeloid leukemia, BCR/ABL-positive, not having achieved remission: Secondary | ICD-10-CM

## 2018-10-30 NOTE — Unmapped (Signed)
Haley Mccullough is not tolerating Tasigna.  I am not sure if her symptoms are related to her TKI's.  I think ponatinib remains her best choice despite the risks.  The goal of this trial was to figure out if she was better when she was not on it.      I told her to remain off the drug.  I would not be in a hurry to restart.  It would be nice to have a BCR ABL when she talks to [employer].  If this is < 0.100, I think she can have a longer break until she sees me in October.  Otherwise, we may need to restart her ponatinib.      Thanks, H

## 2018-11-01 DIAGNOSIS — C9210 Chronic myeloid leukemia, BCR/ABL-positive, not having achieved remission: Secondary | ICD-10-CM

## 2018-11-01 DIAGNOSIS — M170 Bilateral primary osteoarthritis of knee: Secondary | ICD-10-CM

## 2018-11-01 MED ORDER — OXYCODONE 10 MG TABLET
ORAL_TABLET | Freq: Three times a day (TID) | ORAL | 0 refills | 30 days | Status: CP | PRN
Start: 2018-11-01 — End: ?

## 2018-11-01 NOTE — Unmapped (Signed)
Narcotic, Oxycodone refill  Last ov: 12/10/2017   Next ov: 01/06/2019

## 2018-11-01 NOTE — Unmapped (Signed)
Addended by: Mellody Life on: 11/01/2018 04:42 PM     Modules accepted: Orders

## 2018-11-01 NOTE — Unmapped (Signed)
Patient scheduled for Monday 9/14@ 11am    Thanks

## 2018-11-01 NOTE — Unmapped (Signed)
Reason for call: Pt called in requesting for a refill on her oxycodone to please be sent to Pharmacy on file.     Thanks      Last ov: Visit date not found  Next ov: 01/06/2019

## 2018-11-04 ENCOUNTER — Encounter: Admit: 2018-11-04 | Discharge: 2018-11-05 | Payer: MEDICARE

## 2018-11-04 DIAGNOSIS — C9210 Chronic myeloid leukemia, BCR/ABL-positive, not having achieved remission: Secondary | ICD-10-CM | POA: Diagnosis not present

## 2018-11-04 LAB — CBC W/ AUTO DIFF
BASOPHILS ABSOLUTE COUNT: 0.1 10*9/L (ref 0.0–0.1)
BASOPHILS RELATIVE PERCENT: 0.8 %
EOSINOPHILS ABSOLUTE COUNT: 0.1 10*9/L (ref 0.0–0.4)
EOSINOPHILS RELATIVE PERCENT: 1.3 %
HEMOGLOBIN: 14.9 g/dL (ref 13.5–16.0)
LARGE UNSTAINED CELLS: 3 % (ref 0–4)
LYMPHOCYTES ABSOLUTE COUNT: 2.7 10*9/L (ref 1.5–5.0)
LYMPHOCYTES RELATIVE PERCENT: 26.2 %
MEAN CORPUSCULAR HEMOGLOBIN CONC: 30.4 g/dL — ABNORMAL LOW (ref 31.0–37.0)
MEAN CORPUSCULAR HEMOGLOBIN: 26.6 pg (ref 26.0–34.0)
MEAN CORPUSCULAR VOLUME: 87.4 fL (ref 80.0–100.0)
MEAN PLATELET VOLUME: 7.2 fL (ref 7.0–10.0)
MONOCYTES ABSOLUTE COUNT: 0.9 10*9/L — ABNORMAL HIGH (ref 0.2–0.8)
MONOCYTES RELATIVE PERCENT: 8.6 %
NEUTROPHILS ABSOLUTE COUNT: 6.1 10*9/L (ref 2.0–7.5)
NEUTROPHILS RELATIVE PERCENT: 59.7 %
PLATELET COUNT: 212 10*9/L (ref 150–440)
RED BLOOD CELL COUNT: 5.61 10*12/L — ABNORMAL HIGH (ref 4.00–5.20)
WBC ADJUSTED: 10.2 10*9/L (ref 4.5–11.0)

## 2018-11-04 LAB — COMPREHENSIVE METABOLIC PANEL
ALBUMIN: 3.9 g/dL (ref 3.5–5.0)
ALKALINE PHOSPHATASE: 151 U/L — ABNORMAL HIGH (ref 38–126)
ALT (SGPT): 31 U/L (ref <35)
AST (SGOT): 29 U/L (ref 14–38)
BILIRUBIN TOTAL: 0.4 mg/dL (ref 0.0–1.2)
BLOOD UREA NITROGEN: 6 mg/dL — ABNORMAL LOW (ref 7–21)
BUN / CREAT RATIO: 6
CALCIUM: 8.7 mg/dL (ref 8.5–10.2)
CHLORIDE: 108 mmol/L — ABNORMAL HIGH (ref 98–107)
CO2: 24 mmol/L (ref 22.0–30.0)
CREATININE: 1.01 mg/dL — ABNORMAL HIGH (ref 0.60–1.00)
EGFR CKD-EPI AA FEMALE: 75 mL/min/{1.73_m2} (ref >=60)
EGFR CKD-EPI NON-AA FEMALE: 65 mL/min/{1.73_m2} (ref >=60)
GLUCOSE RANDOM: 109 mg/dL (ref 70–179)
POTASSIUM: 4.4 mmol/L (ref 3.5–5.0)
PROTEIN TOTAL: 6.1 g/dL — ABNORMAL LOW (ref 6.5–8.3)

## 2018-11-04 LAB — CALCIUM: Calcium:MCnc:Pt:Ser/Plas:Qn:: 8.7

## 2018-11-04 LAB — MEAN CORPUSCULAR HEMOGLOBIN CONC: Lab: 30.4 — ABNORMAL LOW

## 2018-11-04 LAB — LIPASE: Triacylglycerol lipase:CCnc:Pt:Ser/Plas:Qn:: 28 — ABNORMAL LOW

## 2018-11-04 LAB — LACTATE DEHYDROGENASE: Lactate dehydrogenase:CCnc:Pt:Ser/Plas:Qn:: 748 — ABNORMAL HIGH

## 2018-11-07 ENCOUNTER — Encounter: Admit: 2018-11-07 | Discharge: 2018-11-07 | Payer: MEDICARE | Attending: Pharmacist | Primary: Pharmacist

## 2018-11-07 ENCOUNTER — Encounter: Admit: 2018-11-07 | Discharge: 2018-11-07 | Payer: MEDICARE

## 2018-11-07 DIAGNOSIS — C9210 Chronic myeloid leukemia, BCR/ABL-positive, not having achieved remission: Secondary | ICD-10-CM | POA: Diagnosis not present

## 2018-11-07 NOTE — Unmapped (Signed)
Called Haley Mccullough to discuss Dr. Raeanne Gathers recommendations related to her BCR-ABL and current TKI therapy.     She had previously been switched from ponatinib daily to nilotinib due to concomitant estrogen product and concern for thrombosis with this. However, when she was started on nilotinib, even at lower doses, she immediately started having vomiting all the time. She switched back to ponatinib and re-introduced the aspirin. She noted when she restarted the ponatinib she developed her usual itchy skin. She also developed a yeast infection. She has a history of yeast infections but it has been over a year since her last one, and she believes restarting ponatinib brought this on. Taking fluconazole 150 mg weekly, and says she often needs to do this for 3-4 months per her PCP.     Per Dr. Juanna Cao plan, if her BCR-ABL showed MMR or better (< 0.100) then he would be comfortable with her holding TKI therapy for now (until he discusses with her in October at next appointment) to allow side effect resolution. Her BCR-ABL from 9/14 resulted as 0.002.     She will STOP ponatinib and hold all TKI therapy until she sees MD next month. She will continue to follow her PCP for recommendations related to yeast infection and continued fluconazole use.     I also briefly introduced the TKI PK study, as she may be a candidate when her therapy is restarted.       Manfred Arch, PharmD, BCOP, CPP  Pager: [phone]

## 2018-11-12 ENCOUNTER — Encounter: Payer: Self-pay | Admitting: Internal Medicine

## 2018-11-12 ENCOUNTER — Other Ambulatory Visit: Payer: Self-pay

## 2018-11-12 ENCOUNTER — Ambulatory Visit: Payer: Medicare Other | Admitting: Internal Medicine

## 2018-11-12 VITALS — BP 122/90 | HR 95 | Resp 16 | Ht 64.0 in | Wt 247.0 lb

## 2018-11-12 DIAGNOSIS — J449 Chronic obstructive pulmonary disease, unspecified: Secondary | ICD-10-CM | POA: Diagnosis not present

## 2018-11-12 DIAGNOSIS — M0579 Rheumatoid arthritis with rheumatoid factor of multiple sites without organ or systems involvement: Secondary | ICD-10-CM | POA: Diagnosis not present

## 2018-11-12 DIAGNOSIS — J4489 Other specified chronic obstructive pulmonary disease: Secondary | ICD-10-CM

## 2018-11-12 NOTE — Progress Notes (Signed)
[facility] [place], [place] [address]  Pulmonary Sleep Medicine   Office Visit Note  Patient Name: Laura Chen DOB: [birth year] MRN [id number]  Date of Service: 11/12/2018  Complaints/HPI: Pt is here for follow up on PFT results.  Her pft was within normal limits.  She denies any current issues.  She has not been sick or hospitalized.  Overall she is doing well. She does not currently use any inhalers.    ROS  General: (-) fever, (-) chills, (-) night sweats, (-) weakness Skin: (-) rashes, (-) itching,. Eyes: (-) visual changes, (-) redness, (-) itching. Nose and Sinuses: (-) nasal stuffiness or itchiness, (-) postnasal drip, (-) nosebleeds, (-) sinus trouble. Mouth and Throat: (-) sore throat, (-) hoarseness. Neck: (-) swollen glands, (-) enlarged thyroid, (-) neck pain. Respiratory: - cough, (-) bloody sputum, - shortness of breath, - wheezing. Cardiovascular: - ankle swelling, (-) chest pain. Lymphatic: (-) lymph node enlargement. Neurologic: (-) numbness, (-) tingling. Psychiatric: (-) anxiety, (-) depression   Current Medication: Outpatient Encounter Medications as of 11/12/2018  Medication Sig Note  . Accu-Chek FastClix Lancets MISC USE TO TEST BLOOD SUGAR QD UTD DX E11.65   . aspirin EC 81 MG tablet Take 81 mg by mouth every evening.    . baclofen (LIORESAL) 10 MG tablet Take 10 mg by mouth 3 (three) times daily.   Marland Kitchen buPROPion (WELLBUTRIN SR) 100 MG 12 hr tablet Take 1 tablet (100 mg total) by mouth daily.   . cetirizine (ZYRTEC) 10 MG tablet Take 10 mg by mouth.   . cholecalciferol (VITAMIN D) 1000 units tablet Take 2,000 Units by mouth daily.   . DULoxetine (CYMBALTA) 20 MG capsule Take 1 capsule (20 mg total) by mouth daily.   . fluconazole (DIFLUCAN) 150 MG tablet Take 1 tablet po once weekly   . fluticasone (FLONASE) 50 MCG/ACT nasal spray USE 1 SPRAY IN BOTH  NOSTRILS DAILY.   Marland Kitchen glucose blood (ACCU-CHEK GUIDE) test strip USE TO CHECK  BLOOD GLUCOSE  EVERY DAY AS DIRECTED   . levothyroxine (SYNTHROID) 50 MCG tablet Take 1 tablet (50 mcg total) by mouth daily.   Marland Kitchen LINZESS 290 MCG CAPS capsule TAKE 1 CAPSULE BY MOUTH  DAILY   . metroNIDAZOLE (METROGEL) 0.75 % vaginal gel Place 1 Applicatorful vaginally 2 (two) times daily.   Marland Kitchen omeprazole (PRILOSEC) 20 MG capsule Take 1 capsule (20 mg total) by mouth daily.   . Oxycodone HCl 10 MG TABS Take 10 mg by mouth 3 (three) times daily as needed (for pain).   . ponatinib HCl (ICLUSIG) 15 MG tablet Take 15 mg by mouth daily.  10/07/2018: Reports taking every other day currently  . predniSONE (DELTASONE) 5 MG tablet Take 5 mg by mouth daily with breakfast.   . rosuvastatin (CRESTOR) 5 MG tablet TAKE 1 TABLET BY MOUTH  DAILY WITH SUPPER   . spironolactone (ALDACTONE) 50 MG tablet daily.   . traMADol (ULTRAM) 50 MG tablet Take 2 tablets (100 mg total) by mouth every 12 (twelve) hours as needed.   . [DISCONTINUED] amoxicillin-clavulanate (AUGMENTIN) 875-125 MG tablet Take 1 tablet by mouth 2 (two) times daily. (Patient not taking: Reported on 10/07/2018)    No facility-administered encounter medications on file as of 11/12/2018.     Surgical History: Past Surgical History:  Procedure Laterality Date  . DEXA  2010   osteopenia  . MYOMECTOMY  2008  . right ankle AFO  2011  . TRANSTHORACIC ECHOCARDIOGRAM  123456   nl systolic/diastolic fxn, EF [id number], mild MR, normal PA pressures  . US/HSG  05/2010   possible endometrial polyp, rec rpt 8 wks continue loestrin 24 [facility])    Medical History: Past Medical History:  Diagnosis Date  . Asthma   . CML (chronic myeloid leukemia) ([place]) 2011   (Dr. Alyse Low) stopped gleevec 2/2 side effects  . Diabetes mellitus without complication ([place])   . GERD (gastroesophageal reflux disease)   . History of shingles   . History of syphilis 1990   s/p treatment  . Hypertension   . Leukemia ([place])   . Osteopenia    due to prednisone use, was on  reclast  . PUD (peptic ulcer disease)   . Rheumatoid arthritis(714.0) 1990s   Jacoud's arthropathy, previously treated with MTX, TNFa (remicade, enbrel, humira, orencia, rituximab, and actemra)  . SLE (systemic lupus erythematosus) ([place]) 09/2009   Rheum-Dr. Joan Mayans  . Thyroid goiter    Endo-Dr. Eddie Dibbles    Family History: Family History  Problem Relation Age of Onset  . Heart attack Maternal Grandmother   . Coronary artery disease Maternal Grandmother   . Hypertension Mother   . Hypertension Other        Aunt    Social History: Social History   Socioeconomic History  . Marital status: Single    Spouse name: Not on file  . Number of children: Not on file  . Years of education: Not on file  . Highest education level: Not on file  Occupational History  . Occupation: Secretary/administrator at [employer]  . Financial resource strain: Not hard at all  . Food insecurity    Worry: Never true    Inability: Never true  . Transportation needs    Medical: No    Non-medical: No  Tobacco Use  . Smoking status: Former Smoker    Packs/day: 0.30    Types: Cigarettes  . Smokeless tobacco: Never Used  . Tobacco comment: 6 cigarettes/day  Substance and Sexual Activity  . Alcohol use: No  . Drug use: No  . Sexual activity: Not on file  Lifestyle  . Physical activity    Days per week: 3 days    Minutes per session: 20 min  . Stress: Not on file  Relationships  . Social Herbalist on phone: Not on file    Gets together: Not on file    Attends religious service: Not on file    Active member of club or organization: Not on file    Attends meetings of clubs or organizations: Not on file    Relationship status: Not on file  . Intimate partner violence    Fear of current or ex partner: No    Emotionally abused: No    Physically abused: No    Forced sexual activity: No  Other Topics Concern  . Not on file  Social History Narrative   Caffeine: 1 cup/day coffee       Lives with mother, 1 dog      HS education    Vital Signs: Blood pressure 122/90, pulse 95, resp. rate 16, height 5\' 4"  (1.626 m), weight 247 lb (112 kg), SpO2 98 %.  Examination: General Appearance: The patient is well-developed, well-nourished, and in no distress. Skin: Gross inspection of skin unremarkable. Head: normocephalic, no gross deformities. Eyes: no gross deformities noted. ENT: ears appear grossly normal no exudates. Neck: Supple. No thyromegaly. No LAD. Respiratory: clear bilaterally. Cardiovascular:  Normal S1 and S2 without murmur or rub. Extremities: No cyanosis. pulses are equal. Neurologic: Alert and oriented. No involuntary movements.  LABS: Recent Results (from the past 2160 hour(s))  CULTURE, URINE COMPREHENSIVE     Status: Abnormal   Collection Time: 09/05/18 12:00 AM   Specimen: Urine   URINE  Result Value Ref Range   Urine Culture, Comprehensive Final report (A)    Organism ID, Bacteria Enterococcus faecalis (A)     Comment: Greater than 100,000 colony forming units per mL   ANTIMICROBIAL SUSCEPTIBILITY Comment     Comment:       ** S = Susceptible; I = Intermediate; R = Resistant **                    P = Positive; N = Negative             MICS are expressed in micrograms per mL    Antibiotic                 RSLT#1    RSLT#2    RSLT#3    RSLT#4 Ciprofloxacin                  S Levofloxacin                   S Nitrofurantoin                 S Penicillin                     S Tetracycline                   R Vancomycin                     S   POCT HgB A1C     Status: Abnormal   Collection Time: 09/05/18  3:06 PM  Result Value Ref Range   Hemoglobin A1C 6.6 (A) 4.0 - 5.6 %   HbA1c POC (<> result, manual entry)     HbA1c, POC (prediabetic range)     HbA1c, POC (controlled diabetic range)    POCT Urinalysis Dipstick     Status: Abnormal   Collection Time: 09/05/18  3:06 PM  Result Value Ref Range   Color, UA     Clarity, UA     Glucose, UA  Negative Negative   Bilirubin, UA negative    Ketones, UA negative    Spec Grav, UA 1.020 1.010 - 1.025   Blood, UA moderate    pH, UA 6.0 5.0 - 8.0   Protein, UA Positive (A) Negative   Urobilinogen, UA 0.2 0.2 or 1.0 E.U./dL   Nitrite, UA negative    Leukocytes, UA Moderate (2+) (A) Negative   Appearance     Odor    POCT Urine Drug Screen     Status: Abnormal   Collection Time: 09/05/18  3:07 PM  Result Value Ref Range   POC METHAMPHETAMINE UR None Detected None Detected   POC Opiate Ur None Detected None Detected   POC Barbiturate UR None Detected None Detected   POC Amphetamine UR None Detected None Detected   POC Oxycodone UR Positive (A) None Detected   POC Cocaine UR None Detected None Detected   POC Ecstasy UR None Detected None Detected   POC TRICYCLICS UR None Detected None Detected   POC PHENCYCLIDINE UR None Detected None Detected   POC  MARIJUANA UR None Detected None Detected   POC METHADONE UR None Detected None Detected   POC BENZODIAZEPINES UR None Detected None Detected   URINE TEMPERATURE     POC DRUG SCREEN OXIDANTS URINE     POC SPECIFIC GRAVITY URINE     POC PH URINE     Methylenedioxyamphetamine    Vitamin D 1,25 dihydroxy     Status: None   Collection Time: 09/09/18  9:03 AM  Result Value Ref Range   Vitamin D 1, 25 (OH)2 Total 28 pg/mL    Comment: Reference Range: Adults: 21 - 65    Vitamin D2 1, 25 (OH)2 <10 pg/mL    Comment: This test was developed and its performance characteristics determined by [facility]. It has not been cleared or approved by the Food and Drug Administration.    Vitamin D3 1, 25 (OH)2 25 pg/mL    Comment: This test was developed and its performance characteristics determined by [facility]. It has not been cleared or approved by the Food and Drug Administration.   Lipid panel     Status: None   Collection Time: 09/09/18  9:03 AM  Result Value Ref Range   Cholesterol, Total 194 100 - 199 mg/dL   Triglycerides 143 0 - 149  mg/dL   HDL 90 >39 mg/dL   VLDL Cholesterol Cal 29 5 - 40 mg/dL   LDL Calculated 75 0 - 99 mg/dL   Chol/HDL Ratio 2.2 0.0 - 4.4 ratio    Comment:                                   T. Chol/HDL Ratio                                             Men  Women                               1/2 Avg.Risk  3.4    3.3                                   Avg.Risk  5.0    4.4                                2X Avg.Risk  9.6    7.1                                3X Avg.Risk 23.4   11.0   TSH     Status: Abnormal   Collection Time: 09/09/18  9:03 AM  Result Value Ref Range   TSH 9.070 (H) 0.450 - 4.500 uIU/mL  T4, free     Status: None   Collection Time: 09/09/18  9:03 AM  Result Value Ref Range   Free T4 0.95 0.82 - 1.77 ng/dL  Comprehensive metabolic panel     Status: Abnormal   Collection Time: 09/09/18  9:03 AM  Result Value Ref Range   Glucose 82 65 - 99 mg/dL   BUN 16 6 - 24 mg/dL  Creatinine, Ser 1.33 (H) 0.57 - 1.00 mg/dL   GFR calc non Af Amer 47 (L) >59 mL/min/1.73   GFR calc Af Amer 54 (L) >59 mL/min/1.73   BUN/Creatinine Ratio 12 9 - 23   Sodium 138 134 - 144 mmol/L   Potassium 4.1 3.5 - 5.2 mmol/L   Chloride 99 96 - 106 mmol/L   CO2 21 20 - 29 mmol/L   Calcium 9.6 8.7 - 10.2 mg/dL   Total Protein 6.4 6.0 - 8.5 g/dL   Albumin 4.6 3.8 - 4.8 g/dL   Globulin, Total 1.8 1.5 - 4.5 g/dL   Albumin/Globulin Ratio 2.6 (H) 1.2 - 2.2   Bilirubin Total 0.2 0.0 - 1.2 mg/dL   Alkaline Phosphatase 471 (H) 39 - 117 IU/L   AST 173 (H) 0 - 40 IU/L   ALT 219 (H) 0 - 32 IU/L  CBC with Differential/Platelet     Status: Abnormal   Collection Time: 09/09/18  9:03 AM  Result Value Ref Range   WBC 13.4 (H) 3.4 - 10.8 x10E3/uL   RBC 6.59 (H) 3.77 - 5.28 x10E6/uL   Hemoglobin 16.3 (H) 11.1 - 15.9 g/dL   Hematocrit 52.4 (H) 34.0 - 46.6 %   MCV 80 79 - 97 fL   MCH 24.7 (L) 26.6 - 33.0 pg   MCHC 31.1 (L) 31.5 - 35.7 g/dL   RDW 21.8 (H) 11.7 - 15.4 %   Platelets 230 150 - 450 x10E3/uL   Neutrophils  44 Not Estab. %   Lymphs 33 Not Estab. %   Monocytes 18 Not Estab. %   Eos 1 Not Estab. %   Basos 0 Not Estab. %   Neutrophils Absolute 6.0 1.4 - 7.0 x10E3/uL   Lymphocytes Absolute 4.4 (H) 0.7 - 3.1 x10E3/uL   Monocytes Absolute 2.4 (H) 0.1 - 0.9 x10E3/uL   EOS (ABSOLUTE) 0.2 0.0 - 0.4 x10E3/uL   Basophils Absolute 0.0 0.0 - 0.2 x10E3/uL   Immature Granulocytes 4 Not Estab. %   Immature Grans (Abs) 0.5 (H) 0.0 - 0.1 x10E3/uL    Comment: (An elevated percentage of Immature Granulocytes has not been found to be clinically significant as a sole clinical predictor of disease. Does NOT include bands or blast cells.  Pregnancy associated physiological leukocytosis may also show increased immature granulocytes without clinical significance.)   Comprehensive metabolic panel     Status: Abnormal   Collection Time: 09/19/18  1:16 PM  Result Value Ref Range   Glucose 147 (H) 65 - 99 mg/dL   BUN 11 6 - 24 mg/dL   Creatinine, Ser 1.28 (H) 0.57 - 1.00 mg/dL   GFR calc non Af Amer 49 (L) >59 mL/min/1.73   GFR calc Af Amer 56 (L) >59 mL/min/1.73   BUN/Creatinine Ratio 9 9 - 23   Sodium 142 134 - 144 mmol/L   Potassium 4.4 3.5 - 5.2 mmol/L   Chloride 103 96 - 106 mmol/L   CO2 16 (L) 20 - 29 mmol/L   Calcium 9.7 8.7 - 10.2 mg/dL   Total Protein 5.9 (L) 6.0 - 8.5 g/dL   Albumin 4.1 3.8 - 4.8 g/dL   Globulin, Total 1.8 1.5 - 4.5 g/dL   Albumin/Globulin Ratio 2.3 (H) 1.2 - 2.2   Bilirubin Total <0.2 0.0 - 1.2 mg/dL   Alkaline Phosphatase 464 (H) 39 - 117 IU/L   AST 97 (H) 0 - 40 IU/L   ALT 170 (H) 0 - 32 IU/L  Hepatitis panel, acute  Status: None   Collection Time: 09/19/18  1:16 PM  Result Value Ref Range   Hep A IgM Negative Negative   Hepatitis B Surface Ag Negative Negative   Hep B C IgM Negative Negative   Hep C Virus Ab <0.1 0.0 - 0.9 s/co ratio    Comment:                                   Negative:     < 0.8                              Indeterminate: 0.8 - 0.9                                    Positive:     > 0.9  The CDC recommends that a positive HCV antibody result  be followed up with a HCV Nucleic Acid Amplification  test [id number]).   Amylase     Status: None   Collection Time: 09/19/18  1:16 PM  Result Value Ref Range   Amylase 32 31 - 110 U/L  Lipase     Status: None   Collection Time: 09/19/18  1:16 PM  Result Value Ref Range   Lipase 23 14 - 72 U/L    Radiology: Dg Chest 2 View  Result Date: 06/28/2017 CLINICAL DATA:  Cough and congestion with body aches. History of leukemia. EXAM: CHEST - 2 VIEW COMPARISON:  Chest x-ray dated April 13, 2017. FINDINGS: The heart size and mediastinal contours are within normal limits. Normal pulmonary vascularity. Unchanged eventration of the anterior right hemidiaphragm with blunting of the right costophrenic angle. Bronchitic changes appear slightly worsened when compared to prior study. Stable scarring/atelectasis at the left lung base. No focal consolidation, pleural effusion, or pneumothorax. No acute osseous abnormality. IMPRESSION: Slightly worsened bronchitic changes.  No consolidation. Electronically Signed   By: Titus Dubin M.D.   On: 06/28/2017 09:35    No results found.  No results found.    Assessment and Plan: Patient Active Problem List   Diagnosis Date Noted  . Screening for breast cancer 09/15/2018  . Acquired hypothyroidism 09/15/2018  . Vitamin D deficiency 09/15/2018  . 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 Iron deficiency anemia 06/16/2018  . Acute vaginitis 02/24/2018  . Hematuria 01/23/2018  . Bacterial vaginitis 01/23/2018  . Uncontrolled type 2 diabetes mellitus with hyperglycemia ([place]) 12/16/2017  . Mixed hyperlipidemia 12/16/2017  . Depression, major, recurrent, moderate ([place]) 12/16/2017  . Urinary tract infection with hematuria 11/11/2017  . Dysuria 11/11/2017  . Vaginal candidiasis 11/11/2017  . Episode of moderate major depression ([place]) 11/11/2017  .  Hypertension 08/14/2017  . Hypokalemia 08/14/2017  . Sepsis ([place]) 06/28/2017  . Acute on chronic kidney failure ([place]) 04/14/2017  . (HFpEF) heart failure with preserved ejection fraction ([place]) 12/12/2016  . Other chest pain 12/12/2016  . Pneumonia 04/30/2015  . Acute renal failure (ARF) ([place]) 04/24/2015  . Elevated LFTs 04/24/2015  . Hyponatremia 04/24/2015  . Influenza A 04/24/2015  . Oral candidiasis 04/24/2015  . Pulmonary nodule 08/26/2014  . Iritis 04/30/2013  . Steroid responder, bilateral 04/30/2013  . Hemoptysis 11/02/2012  . Constipation 05/08/2012  . Complete rupture of rotator cuff 09/19/2011  . 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 Effusion of  wrist 11/22/2010  . Hand joint pain 11/22/2010  . Polymenorrhea 10/28/2010  . Anemia 10/28/2010  . External otitis 10/27/2010  . Endometrial polyp 10/27/2010  . Menorrhagia 10/27/2010  . Right flank pain 10/12/2010  . Fatigue 06/23/2010  . Skin rash 06/07/2010  . Edema 06/07/2010  . Tobacco abuse 06/07/2010  . Tobacco dependence syndrome 06/07/2010  . Primary localized osteoarthrosis, lower leg 05/11/2010  . Chronic myeloid leukemia ([place]) 03/24/2010  . ASTHMA 03/24/2010  . GERD 03/24/2010  . SYSTEMIC LUPUS ERYTHEMATOSUS 03/24/2010  . Rheumatoid arthritis ([place]) 03/24/2010  . FLATULENCE ERUCTATION AND GAS PAIN 03/24/2010  . Asthma 03/24/2010    1. Chronic bronchitis with COPD (chronic obstructive pulmonary disease) ([place]) Pt will continue to monitor.  She does not currently use any inhalers.   2. Morbid obesity ([place]) Obesity Counseling: Risk Assessment: An assessment of behavioral risk factors was made today and includes lack of exercise sedentary lifestyle, lack of portion control and poor dietary habits.  Risk Modification Advice: She was counseled on portion control guidelines. Restricting daily caloric intake to. . The detrimental long term effects of obesity on her health and ongoing poor compliance was also  discussed with the patient.  3. Rheumatoid arthritis involving multiple sites with positive rheumatoid factor ([place]) Follow up with Rheumatologist as directed.   General Counseling: I have discussed the findings of the evaluation and examination with Mariavictoria.  I have also discussed any further diagnostic evaluation thatmay be needed or ordered today. Sallye verbalizes understanding of the findings of todays visit. We also reviewed her medications today and discussed drug interactions and side effects including but not limited excessive drowsiness and altered mental states. We also discussed that there is always a risk not just to her but also people around her. she has been encouraged to call the office with any questions or concerns that should arise related to todays visit.    Time spent: 15 This patient was seen by Orson Gear AGNP-C in Collaboration with Dr. Devona Konig as a part of collaborative care agreement.   I have personally obtained a history, examined the patient, evaluated laboratory and imaging results, formulated the assessment and plan and placed orders.    Allyne Gee, MD [facility] Pulmonary and Critical Care Sleep medicine

## 2018-11-18 ENCOUNTER — Other Ambulatory Visit: Payer: Self-pay

## 2018-11-18 ENCOUNTER — Ambulatory Visit (INDEPENDENT_AMBULATORY_CARE_PROVIDER_SITE_OTHER): Payer: Medicare Other | Admitting: Nurse Practitioner

## 2018-11-18 ENCOUNTER — Encounter: Payer: Self-pay | Admitting: Nurse Practitioner

## 2018-11-18 VITALS — BP 134/73 | HR 90 | Temp 95.1°F | Resp 16 | Ht 64.0 in | Wt 249.0 lb

## 2018-11-18 DIAGNOSIS — B370 Candidal stomatitis: Secondary | ICD-10-CM

## 2018-11-18 DIAGNOSIS — N390 Urinary tract infection, site not specified: Secondary | ICD-10-CM | POA: Diagnosis not present

## 2018-11-18 DIAGNOSIS — N760 Acute vaginitis: Secondary | ICD-10-CM

## 2018-11-18 DIAGNOSIS — N898 Other specified noninflammatory disorders of vagina: Secondary | ICD-10-CM | POA: Diagnosis not present

## 2018-11-18 DIAGNOSIS — B9689 Other specified bacterial agents as the cause of diseases classified elsewhere: Secondary | ICD-10-CM | POA: Diagnosis not present

## 2018-11-18 LAB — POCT URINALYSIS DIPSTICK
Bilirubin, UA: NEGATIVE
Blood, UA: NEGATIVE
Glucose, UA: NEGATIVE
Ketones, UA: NEGATIVE
Nitrite, UA: NEGATIVE
Protein, UA: POSITIVE — AB
Spec Grav, UA: 1.01 (ref 1.010–1.025)
Urobilinogen, UA: 0.2 E.U./dL
pH, UA: 6.5 (ref 5.0–8.0)

## 2018-11-18 MED ORDER — METRONIDAZOLE 0.75 % VA GEL: 1.0000 | Freq: Two times a day (BID) | VAGINAL | 2 refills | Status: DC

## 2018-11-18 MED ORDER — FLUCONAZOLE 150 MG PO TABS: ORAL_TABLET | ORAL | 2 refills | Status: DC

## 2018-11-18 MED ORDER — LEVOFLOXACIN 500 MG PO TABS: 500.0000 mg | ORAL_TABLET | Freq: Every day | ORAL | 0 refills | Status: DC

## 2018-11-18 NOTE — Progress Notes (Signed)
[facility], [place] [address]  Internal MEDICINE  Office Visit Note  Patient Name: Laura Chen  [id number]  [id number]  Date of Service: 11/18/2018   Pt is here for a sick visit.  Chief Complaint  Patient presents with  . Vaginitis    itchiness, inflammation, and sore  . Thrush     The patient is here for acute visit. Laura Chen is having vaginal itching and irritation along with frequency and pain with urination. Laura Chen states symptoms have been going on for about a week. Gradually getting worse. Laura Chen states that symptoms started when her cancer doctor restarted her old chemotherapy medication. Laura Chen also is reporting symptoms of oral thrush. Has tried to use oral nystatin rinse but this has not helped.        Current Medication:  Outpatient Encounter Medications as of 11/18/2018  Medication Sig Note  . Accu-Chek FastClix Lancets MISC USE TO TEST BLOOD SUGAR QD UTD DX E11.65   . aspirin EC 81 MG tablet Take 81 mg by mouth every evening.    . baclofen (LIORESAL) 10 MG tablet Take 10 mg by mouth 3 (three) times daily.   Marland Kitchen buPROPion (WELLBUTRIN SR) 100 MG 12 hr tablet Take 1 tablet (100 mg total) by mouth daily.   . cetirizine (ZYRTEC) 10 MG tablet Take 10 mg by mouth.   . cholecalciferol (VITAMIN D) 1000 units tablet Take 2,000 Units by mouth daily.   . DULoxetine (CYMBALTA) 20 MG capsule Take 1 capsule (20 mg total) by mouth daily.   . fluconazole (DIFLUCAN) 150 MG tablet Take one tablet every other day for 5 doses   . fluticasone (FLONASE) 50 MCG/ACT nasal spray USE 1 SPRAY IN BOTH  NOSTRILS DAILY.   Marland Kitchen glucose blood (ACCU-CHEK GUIDE) test strip USE TO CHECK BLOOD GLUCOSE  EVERY DAY AS DIRECTED   . levothyroxine (SYNTHROID) 50 MCG tablet Take 1 tablet (50 mcg total) by mouth daily.   Marland Kitchen LINZESS 290 MCG CAPS capsule TAKE 1 CAPSULE BY MOUTH  DAILY   . metroNIDAZOLE (METROGEL) 0.75 % vaginal gel Place 1 Applicatorful vaginally 2 (two) times daily.   Marland Kitchen  omeprazole (PRILOSEC) 20 MG capsule Take 1 capsule (20 mg total) by mouth daily.   . Oxycodone HCl 10 MG TABS Take 10 mg by mouth 3 (three) times daily as needed (for pain).   . ponatinib HCl (ICLUSIG) 15 MG tablet Take 15 mg by mouth daily.  10/07/2018: Reports taking every other day currently  . predniSONE (DELTASONE) 5 MG tablet Take 5 mg by mouth daily with breakfast.   . rosuvastatin (CRESTOR) 5 MG tablet TAKE 1 TABLET BY MOUTH  DAILY WITH SUPPER   . spironolactone (ALDACTONE) 50 MG tablet daily.   . traMADol (ULTRAM) 50 MG tablet Take 2 tablets (100 mg total) by mouth every 12 (twelve) hours as needed.   . [DISCONTINUED] fluconazole (DIFLUCAN) 150 MG tablet Take 1 tablet po once weekly   . [DISCONTINUED] metroNIDAZOLE (METROGEL) 0.75 % vaginal gel Place 1 Applicatorful vaginally 2 (two) times daily.   Marland Kitchen levofloxacin (LEVAQUIN) 500 MG tablet Take 1 tablet (500 mg total) by mouth daily.    No facility-administered encounter medications on file as of 11/18/2018.       Medical History: Past Medical History:  Diagnosis Date  . Asthma   . CML (chronic myeloid leukemia) ([place]) 2011   (Dr. Alyse Low) stopped gleevec 2/2 side effects  . Diabetes mellitus without complication ([place])   .  GERD (gastroesophageal reflux disease)   . History of shingles   . History of syphilis 1990   s/p treatment  . Hypertension   . Leukemia ([place])   . Osteopenia    due to prednisone use, was on reclast  . PUD (peptic ulcer disease)   . Rheumatoid arthritis(714.0) 1990s   Jacoud's arthropathy, previously treated with MTX, TNFa (remicade, enbrel, humira, orencia, rituximab, and actemra)  . SLE (systemic lupus erythematosus) ([place]) 09/2009   Rheum-Dr. Joan Mayans  . Thyroid goiter    Endo-Dr. Eddie Dibbles    Today's Vitals   11/18/18 1404  BP: 134/73  Pulse: 90  Resp: 16  Temp: (!) 95.1 F (35.1 C)  SpO2: 98%  Weight: 249 lb (112.9 kg)  Height: 5\' 4"  (1.626 m)   Body mass index is 42.74 kg/m.  Review  of Systems  Constitutional: Negative for chills, fatigue and unexpected weight change.  HENT: Negative for congestion, postnasal drip, rhinorrhea, sneezing and sore throat.   Respiratory: Negative for cough, chest tightness, shortness of breath and wheezing.   Cardiovascular: Negative for chest pain and palpitations.  Gastrointestinal: Negative for abdominal pain, constipation, diarrhea, nausea and vomiting.  Genitourinary: Positive for dysuria, frequency and vaginal discharge.       Vaginal itching and irritation.   Musculoskeletal: Negative for arthralgias, back pain, joint swelling and neck pain.  Skin: Negative for rash.  Neurological: Negative for tremors, numbness and headaches.  Hematological: Negative for adenopathy. Does not bruise/bleed easily.  Psychiatric/Behavioral: Negative for behavioral problems (Depression), sleep disturbance and suicidal ideas. The patient is not nervous/anxious.     Physical Exam Constitutional:      General: Laura Chen is not in acute distress.    Appearance: Normal appearance. Laura Chen is well-developed. Laura Chen is not diaphoretic.  HENT:     Head: Normocephalic and atraumatic.     Mouth/Throat:     Pharynx: No oropharyngeal exudate.  Eyes:     Pupils: Pupils are equal, round, and reactive to light.  Neck:     Musculoskeletal: Normal range of motion and neck supple.     Thyroid: No thyromegaly.     Vascular: No JVD.     Trachea: No tracheal deviation.  Cardiovascular:     Rate and Rhythm: Normal rate and regular rhythm.     Heart sounds: Normal heart sounds. No murmur. No friction rub. No gallop.   Pulmonary:     Effort: Pulmonary effort is normal. No respiratory distress.     Breath sounds: Normal breath sounds. No wheezing or rales.  Chest:     Chest wall: No tenderness.  Abdominal:     General: Bowel sounds are normal.     Palpations: Abdomen is soft.  Genitourinary:    Comments: Urine sample is positive for protein and large amount of WBCs.   Musculoskeletal: Normal range of motion.  Lymphadenopathy:     Cervical: No cervical adenopathy.  Skin:    General: Skin is warm and dry.  Neurological:     Mental Status: Laura Chen is alert and oriented to person, place, and time.     Cranial Nerves: No cranial nerve deficit.  Psychiatric:        Behavior: Behavior normal.        Thought Content: Thought content normal.        Judgment: Judgment normal.    Assessment/Plan: 1. Urinary tract infection without hematuria, site unspecified Start levofloxacin 500mg  daily for 7 days. Send urine for culture and sensitivity and adjust antibiotics  as indicated.  - CULTURE, URINE COMPREHENSIVE - levofloxacin (LEVAQUIN) 500 MG tablet; Take 1 tablet (500 mg total) by mouth daily.  Dispense: 7 tablet; Refill: 0  2. Vaginal discharge Treat bacterial infection and candidal infection  3. Bacterial vaginitis Metronidazole vaginal gel should be used twice daily for 7 days  - metroNIDAZOLE (METROGEL) 0.75 % vaginal gel; Place 1 Applicatorful vaginally 2 (two) times daily.  Dispense: 70 g; Refill: 2  4. Oral candidiasis Diflucan should be used every other day for 5 doses then as needed.  - fluconazole (DIFLUCAN) 150 MG tablet; Take one tablet every other day for 5 doses  Dispense: 5 tablet; Refill: 2  General Counseling: Laura Chen verbalizes understanding of the findings of todays visit and agrees with plan of treatment. I have discussed any further diagnostic evaluation that may be needed or ordered today. We also reviewed her medications today. Laura Chen has been encouraged to call the office with any questions or concerns that should arise related to todays visit.    Counseling:  This patient was seen by Leretha Pol FNP Collaboration with Dr Lavera Guise as a part of collaborative care agreement  Orders Placed This Encounter  Procedures  . CULTURE, URINE COMPREHENSIVE  . POCT Urinalysis Dipstick    Meds ordered this encounter  Medications  .  metroNIDAZOLE (METROGEL) 0.75 % vaginal gel    Sig: Place 1 Applicatorful vaginally 2 (two) times daily.    Dispense:  70 g    Refill:  2    Order Specific Question:   Supervising Provider    Answer:   Lavera Guise [id number]  . fluconazole (DIFLUCAN) 150 MG tablet    Sig: Take one tablet every other day for 5 doses    Dispense:  5 tablet    Refill:  2    Order Specific Question:   Supervising Provider    Answer:   Lavera Guise [id number]  . levofloxacin (LEVAQUIN) 500 MG tablet    Sig: Take 1 tablet (500 mg total) by mouth daily.    Dispense:  7 tablet    Refill:  0    Order Specific Question:   Supervising Provider    Answer:   Lavera Guise [id number]    Time spent: 25 Minutes

## 2018-11-20 NOTE — Progress Notes (Signed)
Waiting for culture and esensitivity report

## 2018-11-21 LAB — CULTURE, URINE COMPREHENSIVE

## 2018-11-22 NOTE — Progress Notes (Signed)
Patient started on levofloxacin at visit.

## 2018-12-02 ENCOUNTER — Telehealth: Payer: Self-pay

## 2018-12-03 ENCOUNTER — Other Ambulatory Visit: Payer: Self-pay | Admitting: Nurse Practitioner

## 2018-12-03 DIAGNOSIS — M0579 Rheumatoid arthritis with rheumatoid factor of multiple sites without organ or systems involvement: Secondary | ICD-10-CM

## 2018-12-03 MED ORDER — TRAMADOL HCL 50 MG PO TABS: 100.0000 mg | ORAL_TABLET | Freq: Two times a day (BID) | ORAL | 0 refills | Status: DC | PRN

## 2018-12-03 NOTE — Progress Notes (Signed)
Renewed tramadol prescription for 30 days. Will give additional refills at her visit.

## 2018-12-03 NOTE — Telephone Encounter (Signed)
Renewed tramadol prescription for 30 days. Will give additional refills at her visit.

## 2018-12-05 ENCOUNTER — Encounter
Admit: 2018-12-05 | Discharge: 2018-12-06 | Payer: MEDICARE | Attending: Hematology & Oncology | Primary: Hematology & Oncology

## 2018-12-05 ENCOUNTER — Other Ambulatory Visit: Admit: 2018-12-05 | Discharge: 2018-12-06 | Payer: MEDICARE

## 2018-12-05 DIAGNOSIS — C9210 Chronic myeloid leukemia, BCR/ABL-positive, not having achieved remission: Secondary | ICD-10-CM | POA: Diagnosis not present

## 2018-12-05 DIAGNOSIS — M170 Bilateral primary osteoarthritis of knee: Principal | ICD-10-CM

## 2018-12-05 LAB — CBC W/ AUTO DIFF
BASOPHILS ABSOLUTE COUNT: 0.1 10*9/L (ref 0.0–0.1)
BASOPHILS RELATIVE PERCENT: 1.1 %
EOSINOPHILS ABSOLUTE COUNT: 0.1 10*9/L (ref 0.0–0.4)
EOSINOPHILS RELATIVE PERCENT: 1.6 %
HEMATOCRIT: 50.7 % — ABNORMAL HIGH (ref 36.0–46.0)
HEMOGLOBIN: 14.8 g/dL (ref 12.0–16.0)
LARGE UNSTAINED CELLS: 3 % (ref 0–4)
LYMPHOCYTES ABSOLUTE COUNT: 2.3 10*9/L (ref 1.5–5.0)
LYMPHOCYTES RELATIVE PERCENT: 28.1 %
MEAN CORPUSCULAR HEMOGLOBIN CONC: 29.2 g/dL — ABNORMAL LOW (ref 31.0–37.0)
MEAN CORPUSCULAR VOLUME: 90.9 fL (ref 80.0–100.0)
MEAN PLATELET VOLUME: 8.6 fL (ref 7.0–10.0)
MONOCYTES ABSOLUTE COUNT: 0.9 10*9/L — ABNORMAL HIGH (ref 0.2–0.8)
NEUTROPHILS ABSOLUTE COUNT: 4.6 10*9/L (ref 2.0–7.5)
NEUTROPHILS RELATIVE PERCENT: 55.7 %
PLATELET COUNT: 209 10*9/L (ref 150–440)
RED BLOOD CELL COUNT: 5.58 10*12/L — ABNORMAL HIGH (ref 4.00–5.20)
RED CELL DISTRIBUTION WIDTH: 20.1 % — ABNORMAL HIGH (ref 12.0–15.0)
WBC ADJUSTED: 8.3 10*9/L (ref 4.5–11.0)

## 2018-12-05 LAB — COMPREHENSIVE METABOLIC PANEL
ALBUMIN: 4 g/dL (ref 3.5–5.0)
ALT (SGPT): 29 U/L (ref <35)
ANION GAP: 12 mmol/L (ref 7–15)
AST (SGOT): 35 U/L (ref 14–38)
BILIRUBIN TOTAL: 0.5 mg/dL (ref 0.0–1.2)
BLOOD UREA NITROGEN: 7 mg/dL (ref 7–21)
CALCIUM: 8.5 mg/dL (ref 8.5–10.2)
CHLORIDE: 107 mmol/L (ref 98–107)
CO2: 20 mmol/L — ABNORMAL LOW (ref 22.0–30.0)
CREATININE: 1.01 mg/dL — ABNORMAL HIGH (ref 0.60–1.00)
EGFR CKD-EPI AA FEMALE: 74 mL/min/{1.73_m2} (ref >=60)
EGFR CKD-EPI NON-AA FEMALE: 65 mL/min/{1.73_m2} (ref >=60)
GLUCOSE RANDOM: 116 mg/dL (ref 70–179)
POTASSIUM: 4.4 mmol/L (ref 3.5–5.0)
PROTEIN TOTAL: 6.1 g/dL — ABNORMAL LOW (ref 6.5–8.3)
SODIUM: 139 mmol/L (ref 135–145)

## 2018-12-05 LAB — NEUTROPHILS RELATIVE PERCENT: Neutrophils/100 leukocytes:NFr:Pt:Bld:Qn:Automated count: 55.7

## 2018-12-05 LAB — GLUCOSE RANDOM: Glucose:MCnc:Pt:Ser/Plas:Qn:: 116

## 2018-12-05 LAB — POLYCHROMASIA

## 2018-12-05 MED ORDER — PONATINIB 15 MG TABLET: 15 mg | tablet | Freq: Every day | 6 refills | 30 days | Status: AC

## 2018-12-05 MED ORDER — OXYCODONE 10 MG TABLET: 10 mg | tablet | Freq: Three times a day (TID) | 0 refills | 30 days | Status: AC

## 2018-12-05 NOTE — Unmapped (Signed)
Addended by: Elam Dutch F on: 12/05/2018 02:34 PM     Modules accepted: Orders

## 2018-12-05 NOTE — Unmapped (Signed)
ID: Haley Mccullough is a 53 year old African-American female with a history of cpCML and RA/SLE Overlap Syndrome    ASSESSMENT:  Haley Mccullough is 53 African-American female with a history of chronic phase CML and overlap syndrome involving rheumatoid arthritis and SLE. She has struggled with side effects of her TKI therapy.  She has recently gone on a break from ponatinib.  Overall, I do not think this break was associated with a significant improvement in her symptoms.  We did make an attempt to switch to nilotinib.  This was very poorly tolerated.    After multiple trials with different tyrosine kinase inhibitors, I think Haley. Mccullough's best approach is ponatnib.  She is currently on 15 mg a day, which is considered to be subtherapeutic.  However, doses of 30 mg or more are poorly tolerated.  More importantly, she does appear to respond to the low-dose as evidenced in her BCR ABL transcripts.  I do think that she needs to take the medication consistently since she is very near the therapeutic minimum.    I know of no relationship between tyrosine kinase inhibitors and the development of bacterial vaginosis.  She is seeking help from a gynecologist on this problem.  She may benefit from local estrogen therapy.  There is no significant contraindication to such a treatment if it is deemed helpful.    Her ferritin was not measured at this visit. I will do so at her next visit.     PLAN:  1) Ponatinib 15 mg a day   2) RTC in 3 months   3) Check CBC, CMP, BCR ABL, Ferritin    CANCER HX:   Pre CML therapy for  MCTD:   ?? 1990's: Plaquenil  ?? 2004 - 10: MTX  ?? 2004 - 06: Humira  ?? 10/06, 6/07: Rituxan  ?? 2008 - 09: Remicade2011: Dxed with cp CML; presented with thromb  ?? 2009 - 10: Orencia  ?? 2010: Actemra  03/2010: CML  ?? Dx confirmed with BCR-ABL testing  ?? Began Tasigna leading to anorexia, nausea, and anasarca; her weight went from 205 to 230  ?? Tasigna was DC'd and she improved ?? Her B2 A2 transcript was 20.65%; B3 A2 was 30.83%.   ?? Two attempts were made with bosutinib; this was also intolerable   ?? Also treated with Simponi and Cellcept 2011     04/2010: Began Gleevec at standard dosing; she had severe swelling, anorexia, nausea and worsening joint and bone pain. Attempts were made at smaller doses without improvement    12/2010: Began Sprycel; tolerated it poorly (weakness, swelling) even at 50 mg a day     2013: Treated with Benlysta for MCTD  04/2011: Her B2 A2  = 0.886%;  B3 A2 = 5.3%.  Her weight peaked at 258 (from 240); Sprycel was dc'd    07/2011: Her B2 A2 transcript to 2.581% with B3 A2 transcripts of 20.064%    10/2011: Began ponatinib (15 mg QD) and tolerated it well;   12/2011: DC'd ponatinib due to reports increased vascular events.    01/2012: B2A2 16.6%; B3A2 24.0%  02/2012: Restarted ponatinib; tolerated it well  04/2012: B2A2 0.6%; B3A2 1.5%  06/2012 P210 = 3.250  09/2012: P210 = 2.599%; pulmonary bx revealing RA nodules.    12/2012: Received 2 doses Rituxan  03/2013: P210 = 0.071%  07/2013: Received 2 doses Rituxan  03/2014: P210 = 0.017% (Received 2 doses Rituxan)  09/2014: P210 = 0.006% (Received 2 doses Rituxan -  prednisone of 5 mg)   02/2015: P210 = 0.005%  03/2015 (1 dose Rituxan)  06/2015: P210 = 0.001%  09/2015: P210 = 0.022% (Received 2 doses Rituxan)    -- She begins to gain weight --  10/2015:  P210 = 0.002%  12/2015: She begins Provera.    01/2016: P210 undetectable (also received 2 doses of Rituxan)   06/2016: P210 = 0.006%; ponatinib is stopped; Echo: Nl LV, RV fxn; no sign TR; nl valves; nl liver biopsy  08/2016: P210 = 0.727%; Attempted ponatinib TIW (also poorly tolerated)   09/2016 P210 = 0.558%; Restarted ponatinib TIW (w/o Provera); Myeloid mutation panel was negative   10/2016: P210 = 0.261%; ponatinib increased to 15 mg QD  11/2016: P210 = 0.29%  12/2016: P210 = 0.006%  02/2017: P210 = 0.004%  08/2017: P210 = 0.003% 03/2018: P210 = 0.053%; ponatinib increased to 30 mg QD; this was not tolerated and she returned to 15 mg QD  05/2018: P210 = 0.067%  09/09/18: P210 = 0.007%; ponatinib 15 mg QOD (to transition to nilotinib)  10/16/18: Began Nilotinib 150 mg BID - developed N/V within three days   10/22/18: Restarted ponatinib 15 mg every day  11/04/18: P210: 0.002%    IRON DEFICIENCY  ANEMIA:  09/2012: Feraheme: 1020 mg IV; h/o aspirin use  03/2016: Began aspirin   03/20/19: Colonoscopy  05/2018: Ferritin 15.2; hgb 13.5;   08/04/18: Feraheme: 510 mg  IV  10/17/18: Ferritin 81    INTERVAL HX:   Haley Mccullough was taken off ponatinib due to concerns of several side effects.  These side effects include severe fatigue and hypersomnolence.  She also noted multiple therapies for bacterial vaginosis and pain in multiple joints.  As result of these symptoms, she did stop taking ponatinib during the summer.  However, the benefit of stopping the drug was fairly marginal.    Toward the end of August, we attempted a trial of nilotinib.  She developed nausea and vomiting within 3 days and this trial was stopped.  She returned to taking ponatinib 15 mg daily.  Since doing this, she feels that her vaginal symptoms have worsened.  She continues to have itching and a discharge.  She has been placed on fluconazole without any change as well as a metronidazole gel.    She feels that she is due for Rituxan.  She notes worsening synovitis.  Her fatigue has not changed significantly.    PHYSICAL EXAM:  VS: As recorded above  GENERAL: Haley Mccullough struggled to get to the exam table  HEENT: C/S mildly injected; NP is patent  LYMPH NODES: No significant LAN;   LUNGS: Dec BS; no sx of effusion  COR:RRR; nl S1, S2  ABD: Obese; no HSM  [id number] noted of wrist and MCP on left hand; chronic arthropathy changes  DERM: No rash noted

## 2018-12-05 NOTE — Unmapped (Signed)
Narcotic refill  Last ov: 06/24/2018  Next ov: 01/06/2019

## 2018-12-05 NOTE — Unmapped (Addendum)
Your BCR ABL level in Sept was excellent 0.002.  IF this is 0.0000 for 2 to 5 years, you can stop treatment.     You are responding to low doses. At low doses, you are at a higher risk of dropping below the therapeutic concentration. You need to take religiously.     15 mg is fine - it works.     I think you may benefit from a topical estrogen.     All lab results last 24 hours:    Recent Results (from the past 24 hour(s))   Comprehensive Metabolic Panel    Collection Time: 12/05/18 11:48 AM   Result Value Ref Range    Sodium 139 135 - 145 mmol/L    Potassium 4.4 3.5 - 5.0 mmol/L    Chloride 107 98 - 107 mmol/L    Anion Gap 12 7 - 15 mmol/L    CO2 20.0 (L) 22.0 - 30.0 mmol/L    BUN 7 7 - 21 mg/dL    Creatinine 8.41 (H) 0.60 - 1.00 mg/dL    BUN/Creatinine Ratio 7     EGFR CKD-EPI Non-African American, Female 65 >=60 mL/min/1.66m2    EGFR CKD-EPI African American, Female 66 >=60 mL/min/1.54m2    Glucose 116 70 - 179 mg/dL    Calcium 8.5 8.5 - 32.4 mg/dL    Albumin 4.0 3.5 - 5.0 g/dL    Total Protein 6.1 (L) 6.5 - 8.3 g/dL    Total Bilirubin 0.5 0.0 - 1.2 mg/dL    AST 35 14 - 38 U/L    ALT 29 <35 U/L    Alkaline Phosphatase 122 38 - 126 U/L   BCR/ABL1 p210 Blood    Collection Time: 12/05/18 11:48 AM   Result Value Ref Range    Collection Collected    CBC w/ Differential    Collection Time: 12/05/18 11:48 AM   Result Value Ref Range    WBC 8.3 4.5 - 11.0 10*9/L    RBC 5.58 (H) 4.00 - 5.20 10*12/L    HGB 14.8 12.0 - 16.0 g/dL    HCT 40.1 (H) 02.7 - 46.0 %    MCV 90.9 80.0 - 100.0 fL    MCH 26.5 26.0 - 34.0 pg    MCHC 29.2 (L) 31.0 - 37.0 g/dL    RDW 25.3 (H) 66.4 - 15.0 %    MPV 8.6 7.0 - 10.0 fL    Platelet 209 150 - 440 10*9/L    Neutrophils % 55.7 %    Lymphocytes % 28.1 %    Monocytes % 10.7 %    Eosinophils % 1.6 %    Basophils % 1.1 %    Neutrophil Left Shift 1+ (A) Not Present    Absolute Neutrophils 4.6 2.0 - 7.5 10*9/L    Absolute Lymphocytes 2.3 1.5 - 5.0 10*9/L    Absolute Monocytes 0.9 (H) 0.2 - 0.8 10*9/L Absolute Eosinophils 0.1 0.0 - 0.4 10*9/L    Absolute Basophils 0.1 0.0 - 0.1 10*9/L    Large Unstained Cells 3 0 - 4 %    Microcytosis Slight (A) Not Present    Macrocytosis Slight (A) Not Present    Anisocytosis Moderate (A) Not Present    Hypochromasia Marked (A) Not Present

## 2018-12-05 NOTE — Unmapped (Signed)
Called patient regarding refill. I did refill oxycodone medication as she has been maintained on this medication long-standing. Reviewed PDMP. Patient agrees with in-person visit with me 12/2018.

## 2018-12-05 NOTE — Unmapped (Signed)
Reason for call: Pt called in requesting for a refill on her oxycodone to please be sent to Pharmacy on file.     Thanks      Last ov: Visit date not found  Next ov: 01/06/2019

## 2018-12-05 NOTE — Unmapped (Signed)
Labs drawn peripherally by Shanda [facility].

## 2018-12-06 ENCOUNTER — Other Ambulatory Visit: Payer: Self-pay

## 2018-12-06 ENCOUNTER — Ambulatory Visit (INDEPENDENT_AMBULATORY_CARE_PROVIDER_SITE_OTHER): Payer: Medicare Other | Admitting: Nurse Practitioner

## 2018-12-06 ENCOUNTER — Encounter: Payer: Self-pay | Admitting: Nurse Practitioner

## 2018-12-06 VITALS — BP 120/80 | HR 99 | Temp 97.0°F | Resp 16 | Ht 64.0 in | Wt 252.0 lb

## 2018-12-06 DIAGNOSIS — N3001 Acute cystitis with hematuria: Secondary | ICD-10-CM

## 2018-12-06 DIAGNOSIS — N898 Other specified noninflammatory disorders of vagina: Secondary | ICD-10-CM | POA: Diagnosis not present

## 2018-12-06 DIAGNOSIS — R300 Dysuria: Secondary | ICD-10-CM

## 2018-12-06 DIAGNOSIS — E119 Type 2 diabetes mellitus without complications: Secondary | ICD-10-CM

## 2018-12-06 LAB — POCT URINALYSIS DIPSTICK
Bilirubin, UA: NEGATIVE
Blood, UA: POSITIVE
Glucose, UA: NEGATIVE
Ketones, UA: NEGATIVE
Nitrite, UA: NEGATIVE
Protein, UA: POSITIVE — AB
Spec Grav, UA: 1.015 (ref 1.010–1.025)
Urobilinogen, UA: 0.2 E.U./dL
pH, UA: 6 (ref 5.0–8.0)

## 2018-12-06 LAB — POCT GLYCOSYLATED HEMOGLOBIN (HGB A1C): Hemoglobin A1C: 6.1 % — AB (ref 4.0–5.6)

## 2018-12-06 NOTE — Progress Notes (Signed)
[facility] [place], [place] [address]  Internal MEDICINE  Office Visit Note  Patient Name: Laura Chen  [id number]  [id number]  Date of Service: 12/18/2018  Chief Complaint  Patient presents with  . Diabetes  . Hypertension  . Asthma  . Follow-up    UA    The patient is here for follow up of bacterial vaginosis and uti. She has completed treatment with vaginal metronidazole and oral levofloxacin. She states that she has minimal irritation and inflammation. She has deveoped some vaginal bleeding. Started on October 10/2018. She states that mostly the blood is a dark brown color. When she has a bowel movement, she has vaginal bleeding which is bright red. She has history of fibroid tumor. She has appointment with GYN on 12/12/2018. Her urine sample is positive for blood and WBC. She is not having painful urination or frequency of urination.       Current Medication: Outpatient Encounter Medications as of 12/06/2018  Medication Sig Note  . Accu-Chek FastClix Lancets MISC USE TO TEST BLOOD SUGAR QD UTD DX E11.65   . aspirin EC 81 MG tablet Take 81 mg by mouth every evening.    . baclofen (LIORESAL) 10 MG tablet Take 10 mg by mouth 3 (three) times daily.   Marland Kitchen buPROPion (WELLBUTRIN SR) 100 MG 12 hr tablet Take 1 tablet (100 mg total) by mouth daily.   . cetirizine (ZYRTEC) 10 MG tablet Take 10 mg by mouth.   . cholecalciferol (VITAMIN D) 1000 units tablet Take 2,000 Units by mouth daily.   . DULoxetine (CYMBALTA) 20 MG capsule Take 1 capsule (20 mg total) by mouth daily.   . fluconazole (DIFLUCAN) 150 MG tablet Take one tablet every other day for 5 doses   . fluticasone (FLONASE) 50 MCG/ACT nasal spray USE 1 SPRAY IN BOTH  NOSTRILS DAILY.   Marland Kitchen glucose blood (ACCU-CHEK GUIDE) test strip USE TO CHECK BLOOD GLUCOSE  EVERY DAY AS DIRECTED   . LINZESS 290 MCG CAPS capsule TAKE 1 CAPSULE BY MOUTH  DAILY   . omeprazole (PRILOSEC) 20 MG capsule Take 1 capsule (20  mg total) by mouth daily.   . Oxycodone HCl 10 MG TABS Take 10 mg by mouth 3 (three) times daily as needed (for pain).   . ponatinib HCl (ICLUSIG) 15 MG tablet Take 15 mg by mouth daily.  12/09/2018: Reports taking daily  . predniSONE (DELTASONE) 5 MG tablet Take 5 mg by mouth daily with breakfast.   . rosuvastatin (CRESTOR) 5 MG tablet TAKE 1 TABLET BY MOUTH  DAILY WITH SUPPER   . spironolactone (ALDACTONE) 50 MG tablet daily.   . traMADol (ULTRAM) 50 MG tablet Take 2 tablets (100 mg total) by mouth every 12 (twelve) hours as needed.   . [DISCONTINUED] levofloxacin (LEVAQUIN) 500 MG tablet Take 1 tablet (500 mg total) by mouth daily.   . [DISCONTINUED] levothyroxine (SYNTHROID) 50 MCG tablet Take 1 tablet (50 mcg total) by mouth daily.   . [DISCONTINUED] metroNIDAZOLE (METROGEL) 0.75 % vaginal gel Place 1 Applicatorful vaginally 2 (two) times daily.    No facility-administered encounter medications on file as of 12/06/2018.     Surgical History: Past Surgical History:  Procedure Laterality Date  . DEXA  2010   osteopenia  . MYOMECTOMY  2008  . right ankle AFO  2011  . TRANSTHORACIC ECHOCARDIOGRAM  123456   nl systolic/diastolic fxn, EF [id number], mild MR, normal PA pressures  . US/HSG  05/2010  possible endometrial polyp, rec rpt 8 wks continue loestrin 24 [facility])    Medical History: Past Medical History:  Diagnosis Date  . Asthma   . CML (chronic myeloid leukemia) ([place]) 2011   (Dr. Alyse Low) stopped gleevec 2/2 side effects  . Diabetes mellitus without complication ([place])   . GERD (gastroesophageal reflux disease)   . History of shingles   . History of syphilis 1990   s/p treatment  . Hypertension   . Leukemia ([place])   . Osteopenia    due to prednisone use, was on reclast  . PUD (peptic ulcer disease)   . Rheumatoid arthritis(714.0) 1990s   Jacoud's arthropathy, previously treated with MTX, TNFa (remicade, enbrel, humira, orencia, rituximab, and actemra)  . SLE  (systemic lupus erythematosus) ([place]) 09/2009   Rheum-Dr. Joan Mayans  . Thyroid goiter    Endo-Dr. Eddie Dibbles    Family History: Family History  Problem Relation Age of Onset  . Heart attack Maternal Grandmother   . Coronary artery disease Maternal Grandmother   . Hypertension Mother   . Hypertension Other        Aunt    Social History   Socioeconomic History  . Marital status: Single    Spouse name: Not on file  . Number of children: Not on file  . Years of education: Not on file  . Highest education level: Not on file  Occupational History  . Occupation: Secretary/administrator at [employer]  . Financial resource strain: Not hard at all  . Food insecurity    Worry: Never true    Inability: Never true  . Transportation needs    Medical: No    Non-medical: No  Tobacco Use  . Smoking status: Former Smoker    Packs/day: 0.30    Types: Cigarettes  . Smokeless tobacco: Never Used  . Tobacco comment: 6 cigarettes/day  Substance and Sexual Activity  . Alcohol use: No  . Drug use: No  . Sexual activity: Not on file  Lifestyle  . Physical activity    Days per week: 3 days    Minutes per session: 20 min  . Stress: Not on file  Relationships  . Social Herbalist on phone: Not on file    Gets together: Not on file    Attends religious service: Not on file    Active member of club or organization: Not on file    Attends meetings of clubs or organizations: Not on file    Relationship status: Not on file  . Intimate partner violence    Fear of current or ex partner: No    Emotionally abused: No    Physically abused: No    Forced sexual activity: No  Other Topics Concern  . Not on file  Social History Narrative   Caffeine: 1 cup/day coffee      Lives with mother, 1 dog      HS education      Review of Systems  Constitutional: Negative for activity change, chills, fatigue and unexpected weight change.  HENT: Negative for congestion, postnasal drip,  rhinorrhea, sneezing and sore throat.   Respiratory: Negative for cough, chest tightness, shortness of breath and wheezing.   Cardiovascular: Negative for chest pain and palpitations.  Gastrointestinal: Negative for abdominal pain, constipation, diarrhea, nausea and vomiting.  Endocrine: Negative for cold intolerance, heat intolerance, polydipsia and polyuria.       Blood sugars doing well  Controlled without medication.  Genitourinary: Positive for  dysuria, frequency and vaginal discharge.       Vaginal itching and irritation. Symptoms improved since her last visit.   Musculoskeletal: Negative for arthralgias, back pain, joint swelling and neck pain.  Skin: Negative for rash.  Neurological: Negative for tremors, numbness and headaches.  Hematological: Negative for adenopathy. Does not bruise/bleed easily.  Psychiatric/Behavioral: Negative for behavioral problems (Depression), sleep disturbance and suicidal ideas. The patient is not nervous/anxious.     Today's Vitals   12/06/18 1417  BP: 120/80  Pulse: 99  Resp: 16  Temp: (!) 97 F (36.1 C)  SpO2: 98%  Weight: 252 lb (114.3 kg)  Height: 5\' 4"  (1.626 m)   Body mass index is 43.26 kg/m.  Physical Exam Vitals signs and nursing note reviewed.  Constitutional:      General: She is not in acute distress.    Appearance: Normal appearance. She is well-developed. She is not diaphoretic.  HENT:     Head: Normocephalic and atraumatic.     Mouth/Throat:     Pharynx: No oropharyngeal exudate.  Eyes:     Pupils: Pupils are equal, round, and reactive to light.  Neck:     Musculoskeletal: Normal range of motion and neck supple.     Thyroid: No thyromegaly.     Vascular: No JVD.     Trachea: No tracheal deviation.  Cardiovascular:     Rate and Rhythm: Normal rate and regular rhythm.     Heart sounds: Normal heart sounds. No murmur. No friction rub. No gallop.   Pulmonary:     Effort: Pulmonary effort is normal. No respiratory  distress.     Breath sounds: Normal breath sounds. No wheezing or rales.  Chest:     Chest wall: No tenderness.  Abdominal:     General: Bowel sounds are normal.     Palpations: Abdomen is soft.     Tenderness: There is abdominal tenderness.  Genitourinary:    Comments: Urine sample is positive for blood, protein, and moderate WBC Lymphadenopathy:     Cervical: No cervical adenopathy.  Skin:    General: Skin is warm and dry.  Neurological:     Mental Status: She is alert and oriented to person, place, and time. Mental status is at baseline.     Cranial Nerves: No cranial nerve deficit.  Psychiatric:        Behavior: Behavior normal.        Thought Content: Thought content normal.        Judgment: Judgment normal.    Assessment/Plan: 1. Diabetes mellitus without complication (HCC) - POCT HgB A1C 6.1 today. Controlled with diet and exercise only.  2. Acute cystitis with hematuria Will send urne for culture and sensitivity and treat as indicated.  - CULTURE, URINE COMPREHENSIVE  3. Dysuria U/a positive for protein, blood, and moderate WBC. Patient wishes to wait for culture results before further antibiotic treatment.  - POCT Urinalysis Dipstick  4. Vaginal discharge Advised she follow up with GYN provider as scheduled on 12/12/2018  General Counseling: Declan verbalizes understanding of the findings of todays visit and agrees with plan of treatment. I have discussed any further diagnostic evaluation that may be needed or ordered today. We also reviewed her medications today. she has been encouraged to call the office with any questions or concerns that should arise related to todays visit.  Diabetes Counseling:  1. Addition of ACE inh/ ARB'S for nephroprotection. Microalbumin is updated  2. Diabetic foot care, prevention of complications. Podiatry  consult 3. Exercise and lose weight.  4. Diabetic eye examination, Diabetic eye exam is updated  5. Monitor blood sugar closlely.  nutrition counseling.  6. Sign and symptoms of hypoglycemia including shaking sweating,confusion and headaches.  This patient was seen by Leretha Pol FNP Collaboration with Dr Lavera Guise as a part of collaborative care agreement  Orders Placed This Encounter  Procedures  . CULTURE, URINE COMPREHENSIVE  . POCT HgB A1C  . POCT Urinalysis Dipstick      Time spent: 25 Minutes      Dr Lavera Guise Internal medicine

## 2018-12-09 ENCOUNTER — Encounter: Payer: Self-pay | Admitting: *Deleted

## 2018-12-09 ENCOUNTER — Other Ambulatory Visit: Payer: Self-pay | Admitting: *Deleted

## 2018-12-09 LAB — CULTURE, URINE COMPREHENSIVE

## 2018-12-09 NOTE — Patient Outreach (Signed)
[facility]) Care Management  [place]  12/09/2018   Laura Chen 04-20-1965 [id number]   RN Health Coach Quarterly Outreach   Referral Date:  08/09/2018 Referral Source:  [facility] High Risk List Reason for Referral:  Assess Needs Insurance:  [employer]   Outreach Attempt:   Successful telephone outreach to patient for follow up.  HIPAA verified with patient.  Patient reporting she is doing ok.  States she had to restart her chemotherapy drug because she could not tolerate the other one.  Patient reporting just completing antibiotics for urinary tract/vaginal infection.  States pain and urgency has stopped but continues to have vaginal bleeding.  Has gynecological appointment coming up this week. Continues to monitor blood sugars.  Fasting blood sugar this morning was 87 with fasting ranges of 80-90's.  Recent Hgb A1C was 6.1.  Encounter Medications:  Outpatient Encounter Medications as of 12/09/2018  Medication Sig Note  . Accu-Chek FastClix Lancets MISC USE TO TEST BLOOD SUGAR QD UTD DX E11.65   . aspirin EC 81 MG tablet Take 81 mg by mouth every evening.    . baclofen (LIORESAL) 10 MG tablet Take 10 mg by mouth 3 (three) times daily.   Marland Kitchen buPROPion (WELLBUTRIN SR) 100 MG 12 hr tablet Take 1 tablet (100 mg total) by mouth daily.   . cetirizine (ZYRTEC) 10 MG tablet Take 10 mg by mouth.   . cholecalciferol (VITAMIN D) 1000 units tablet Take 2,000 Units by mouth daily.   . DULoxetine (CYMBALTA) 20 MG capsule Take 1 capsule (20 mg total) by mouth daily.   . fluticasone (FLONASE) 50 MCG/ACT nasal spray USE 1 SPRAY IN BOTH  NOSTRILS DAILY.   Marland Kitchen levothyroxine (SYNTHROID) 50 MCG tablet Take 1 tablet (50 mcg total) by mouth daily.   Marland Kitchen LINZESS 290 MCG CAPS capsule TAKE 1 CAPSULE BY MOUTH  DAILY   . norethindrone (NORLYDA) 0.35 MG tablet Take 1 tablet by mouth daily.   Marland Kitchen omeprazole (PRILOSEC) 20 MG capsule Take 1 capsule (20 mg total) by mouth daily.    . Oxycodone HCl 10 MG TABS Take 10 mg by mouth 3 (three) times daily as needed (for pain).   . ponatinib HCl (ICLUSIG) 15 MG tablet Take 15 mg by mouth daily.  12/09/2018: Reports taking daily  . predniSONE (DELTASONE) 5 MG tablet Take 5 mg by mouth daily with breakfast.   . rosuvastatin (CRESTOR) 5 MG tablet TAKE 1 TABLET BY MOUTH  DAILY WITH SUPPER   . spironolactone (ALDACTONE) 50 MG tablet daily.   . traMADol (ULTRAM) 50 MG tablet Take 2 tablets (100 mg total) by mouth every 12 (twelve) hours as needed.   . fluconazole (DIFLUCAN) 150 MG tablet Take one tablet every other day for 5 doses   . glucose blood (ACCU-CHEK GUIDE) test strip USE TO CHECK BLOOD GLUCOSE  EVERY DAY AS DIRECTED    No facility-administered encounter medications on file as of 12/09/2018.     Functional Status:  In your present state of health, do you have any difficulty performing the following activities: 10/09/2018 09/06/2018  Hearing? - N  Vision? - N  Difficulty concentrating or making decisions? - N  Walking or climbing stairs? - Y  Comment - knee pain with walking and climbing stairs  Dressing or bathing? - N  Doing errands, shopping? - N  Conservation officer, nature and eating ? N -  Using the Toilet? N -  In the past six months, have you accidently  leaked urine? Y -  Comment has had chronic UTI over the past year -  Do you have problems with loss of bowel control? N -  Managing your Medications? N -  Managing your Finances? N -  Housekeeping or managing your Housekeeping? Y -  Comment mother helps with house cleaning -  Some recent data might be hidden    Fall/Depression Screening: Fall Risk  12/09/2018 12/06/2018 10/07/2018  Falls in the past year? 0 0 0  Number falls in past yr: - - -  Injury with Fall? - - -  Risk for fall due to : Medication side effect;Impaired mobility - Medication side effect;Impaired mobility  Follow up Falls prevention discussed;Education provided;Falls evaluation completed - Falls  prevention discussed;Education provided;Falls evaluation completed   PHQ 2/9 Scores 12/06/2018 09/06/2018 09/05/2018 08/19/2018 07/11/2018 07/01/2018 03/19/2018  PHQ - 2 Score 1 0 0 0 0 0 0  PHQ- 9 Score - - - - - - -   THN CM Care Plan Problem One     Most Recent Value  Care Plan Problem One  Knowledge deficiet related to self care management of diabetes  Role Documenting the Problem One  [place] for Problem One  Active  THN Long Term Goal   Patient will maintain Hgb A1C below 7 within the next 90 days.  THN Long Term Goal Start Date  12/09/18  THN Long Term Goal Met Date  12/09/18  Interventions for Problem One Long Term Goal  Congratulated patient on current A1C of 6.1 and maintaining below goal, encouraged continued diabetic food choices, encouraged to continue to monitor blood sugars, reviewed medications and indications and encouraged medication compliance, encouraged to keep and attend scheduled medical appointments, confirmed patient recieved diabetes educational packet in the mail, encouraged patient to review diabetes education, discussed and encouraged to prevent hypoglycemic episodes     Appointments:  Attended appointment with primary care provider, Leretha Pol NP on 12/06/2018 and hematologist on 12/05/2018.  Has scheduled gynecological appointment on 12/12/2018.  Plan: RN Health Coach will send primary care provider quarterly update. RN Health Coach will make next telephone outreach to patient within the month of January.  [place] [phone] [name]

## 2018-12-10 NOTE — Unmapped (Signed)
This patient has been disenrolled from the [facility] specialty pharmacy services due to patient now filling at [facility].Haley Mccullough  [facility] Specialty Pharmacist

## 2018-12-11 ENCOUNTER — Other Ambulatory Visit: Payer: Self-pay | Admitting: Nurse Practitioner

## 2018-12-11 DIAGNOSIS — E039 Hypothyroidism, unspecified: Secondary | ICD-10-CM

## 2018-12-11 MED ORDER — LEVOTHYROXINE SODIUM 50 MCG PO TABS: 50.0000 ug | ORAL_TABLET | Freq: Every day | ORAL | 1 refills | Status: DC

## 2018-12-12 ENCOUNTER — Encounter
Admit: 2018-12-12 | Discharge: 2018-12-13 | Payer: MEDICARE | Attending: Obstetrics & Gynecology | Primary: Obstetrics & Gynecology

## 2018-12-12 DIAGNOSIS — Z124 Encounter for screening for malignant neoplasm of cervix: Secondary | ICD-10-CM | POA: Diagnosis not present

## 2018-12-12 DIAGNOSIS — N939 Abnormal uterine and vaginal bleeding, unspecified: Principal | ICD-10-CM

## 2018-12-12 DIAGNOSIS — N898 Other specified noninflammatory disorders of vagina: Principal | ICD-10-CM

## 2018-12-12 MED ORDER — CLINDAMYCIN 2 % VAGINAL CREAM: 1 | g | Freq: Every day | 1 refills | 6 days | Status: AC

## 2018-12-18 ENCOUNTER — Ambulatory Visit (INDEPENDENT_AMBULATORY_CARE_PROVIDER_SITE_OTHER): Payer: Medicare Other

## 2018-12-18 ENCOUNTER — Other Ambulatory Visit: Payer: Self-pay | Admitting: Nurse Practitioner

## 2018-12-18 ENCOUNTER — Other Ambulatory Visit: Payer: Self-pay

## 2018-12-18 DIAGNOSIS — Z23 Encounter for immunization: Secondary | ICD-10-CM | POA: Diagnosis not present

## 2018-12-18 DIAGNOSIS — E119 Type 2 diabetes mellitus without complications: Secondary | ICD-10-CM | POA: Insufficient documentation

## 2018-12-18 DIAGNOSIS — N3001 Acute cystitis with hematuria: Secondary | ICD-10-CM | POA: Insufficient documentation

## 2018-12-18 DIAGNOSIS — E039 Hypothyroidism, unspecified: Secondary | ICD-10-CM | POA: Diagnosis not present

## 2018-12-18 DIAGNOSIS — C9210 Chronic myeloid leukemia, BCR/ABL-positive, not having achieved remission: Principal | ICD-10-CM

## 2018-12-19 LAB — T3: T3, Total: 125 ng/dL (ref 71–180)

## 2018-12-19 LAB — T4, FREE: Free T4: 0.84 ng/dL (ref 0.82–1.77)

## 2018-12-19 LAB — TSH: TSH: 3.37 u[IU]/mL (ref 0.450–4.500)

## 2018-12-30 DIAGNOSIS — E119 Type 2 diabetes mellitus without complications: Secondary | ICD-10-CM | POA: Diagnosis not present

## 2019-01-03 ENCOUNTER — Other Ambulatory Visit: Payer: Self-pay | Admitting: Nurse Practitioner

## 2019-01-03 DIAGNOSIS — M0579 Rheumatoid arthritis with rheumatoid factor of multiple sites without organ or systems involvement: Secondary | ICD-10-CM

## 2019-01-03 MED ORDER — TRAMADOL HCL 50 MG PO TABS: 100.0000 mg | ORAL_TABLET | Freq: Two times a day (BID) | ORAL | 0 refills | Status: DC | PRN

## 2019-01-03 NOTE — Progress Notes (Signed)
renewed  patient's current prescription for tramadol and sent to her pharmacy.

## 2019-01-06 ENCOUNTER — Encounter: Admit: 2019-01-06 | Discharge: 2019-01-07 | Payer: MEDICARE | Attending: Internal Medicine | Primary: Internal Medicine

## 2019-01-06 DIAGNOSIS — R918 Other nonspecific abnormal finding of lung field: Secondary | ICD-10-CM | POA: Diagnosis not present

## 2019-01-06 DIAGNOSIS — M328 Other forms of systemic lupus erythematosus: Secondary | ICD-10-CM | POA: Diagnosis not present

## 2019-01-06 DIAGNOSIS — G8929 Other chronic pain: Secondary | ICD-10-CM | POA: Diagnosis not present

## 2019-01-06 DIAGNOSIS — M170 Bilateral primary osteoarthritis of knee: Secondary | ICD-10-CM | POA: Diagnosis not present

## 2019-01-06 DIAGNOSIS — Z79899 Other long term (current) drug therapy: Secondary | ICD-10-CM | POA: Diagnosis not present

## 2019-01-06 DIAGNOSIS — H209 Unspecified iridocyclitis: Secondary | ICD-10-CM | POA: Diagnosis not present

## 2019-01-06 DIAGNOSIS — R21 Rash and other nonspecific skin eruption: Secondary | ICD-10-CM | POA: Diagnosis not present

## 2019-01-06 DIAGNOSIS — M059 Rheumatoid arthritis with rheumatoid factor, unspecified: Secondary | ICD-10-CM | POA: Diagnosis not present

## 2019-01-07 MED ORDER — OXYCODONE 10 MG TABLET
ORAL_TABLET | Freq: Three times a day (TID) | ORAL | 0 refills | 30 days | Status: CP | PRN
Start: 2019-01-07 — End: ?

## 2019-01-21 ENCOUNTER — Encounter: Admit: 2019-01-21 | Discharge: 2019-01-22 | Payer: MEDICARE

## 2019-01-21 DIAGNOSIS — Z01812 Encounter for preprocedural laboratory examination: Principal | ICD-10-CM

## 2019-01-24 ENCOUNTER — Ambulatory Visit: Admit: 2019-01-24 | Discharge: 2019-01-25 | Payer: MEDICARE | Attending: Internal Medicine | Primary: Internal Medicine

## 2019-01-24 DIAGNOSIS — L409 Psoriasis, unspecified: Secondary | ICD-10-CM | POA: Diagnosis not present

## 2019-01-24 MED ORDER — FLUOCINOLONE 0.01 % TOPICAL BODY OIL
5 refills | 0 days | Status: CP
Start: 2019-01-24 — End: ?

## 2019-01-24 MED ORDER — VALACYCLOVIR 500 MG TABLET
ORAL_TABLET | Freq: Every day | ORAL | 5 refills | 30.00000 days | Status: CP
Start: 2019-01-24 — End: 2019-02-23

## 2019-01-27 ENCOUNTER — Other Ambulatory Visit: Payer: Self-pay

## 2019-01-27 DIAGNOSIS — E782 Mixed hyperlipidemia: Secondary | ICD-10-CM

## 2019-01-27 MED ORDER — ROSUVASTATIN CALCIUM 5 MG PO TABS: ORAL_TABLET | ORAL | 3 refills | Status: DC

## 2019-01-28 ENCOUNTER — Telehealth: Payer: Self-pay

## 2019-01-29 ENCOUNTER — Inpatient Hospital Stay: Payer: Medicare Other

## 2019-01-29 ENCOUNTER — Ambulatory Visit: Admit: 2019-01-29 | Discharge: 2019-01-30 | Payer: MEDICARE

## 2019-01-29 DIAGNOSIS — M059 Rheumatoid arthritis with rheumatoid factor, unspecified: Secondary | ICD-10-CM | POA: Diagnosis not present

## 2019-01-29 DIAGNOSIS — C9210 Chronic myeloid leukemia, BCR/ABL-positive, not having achieved remission: Secondary | ICD-10-CM | POA: Diagnosis not present

## 2019-02-03 ENCOUNTER — Telehealth: Payer: Self-pay

## 2019-02-03 ENCOUNTER — Other Ambulatory Visit: Payer: Self-pay | Admitting: Nurse Practitioner

## 2019-02-03 DIAGNOSIS — M0579 Rheumatoid arthritis with rheumatoid factor of multiple sites without organ or systems involvement: Secondary | ICD-10-CM

## 2019-02-03 MED ORDER — TRAMADOL HCL 50 MG PO TABS: 100.0000 mg | ORAL_TABLET | Freq: Two times a day (BID) | ORAL | 1 refills | Status: DC | PRN

## 2019-02-03 NOTE — Progress Notes (Signed)
Renewed tramadol for 30 days. Has single refill. She has follow up appointment which she needs to keep 03/10/2019

## 2019-02-03 NOTE — Telephone Encounter (Signed)
Renewed tramadol for 30 days. Has single refill. She has follow up appointment which she needs to keep 03/10/2019

## 2019-02-03 NOTE — Telephone Encounter (Signed)
I am afraid to prescribe any of these due to severely elevated liver enzymes in the past. Can you see if they will cover repatha?

## 2019-02-05 ENCOUNTER — Other Ambulatory Visit: Payer: Self-pay | Admitting: Nurse Practitioner

## 2019-02-05 ENCOUNTER — Encounter
Admit: 2019-02-05 | Discharge: 2019-02-06 | Payer: MEDICARE | Attending: Obstetrics & Gynecology | Primary: Obstetrics & Gynecology

## 2019-02-05 DIAGNOSIS — E782 Mixed hyperlipidemia: Secondary | ICD-10-CM

## 2019-02-05 DIAGNOSIS — N939 Abnormal uterine and vaginal bleeding, unspecified: Principal | ICD-10-CM

## 2019-02-05 DIAGNOSIS — R102 Pelvic and perineal pain: Principal | ICD-10-CM

## 2019-02-05 MED ORDER — REPATHA 140 MG/ML ~~LOC~~ SOSY: 140.0000 mg | PREFILLED_SYRINGE | SUBCUTANEOUS | 5 refills | Status: DC

## 2019-02-05 NOTE — Telephone Encounter (Signed)
Sent new prescription for repatha 140mg  once every other week to [facility]. Please advise her to have pharmacist go over administration instructions with her. Thanks

## 2019-02-05 NOTE — Progress Notes (Signed)
Sent new prescription for repatha 140mg  once every other week to [facility]. Please advise her to have pharmacist go over administration instructions with her.

## 2019-02-07 ENCOUNTER — Ambulatory Visit: Payer: Medicare Other | Admitting: Oncology

## 2019-02-07 DIAGNOSIS — M170 Bilateral primary osteoarthritis of knee: Principal | ICD-10-CM

## 2019-02-07 MED ORDER — OXYCODONE 10 MG TABLET
ORAL_TABLET | Freq: Three times a day (TID) | ORAL | 0 refills | 30 days | Status: CP | PRN
Start: 2019-02-07 — End: ?

## 2019-02-11 NOTE — Telephone Encounter (Signed)
Sent in repatha instead

## 2019-02-20 ENCOUNTER — Encounter: Admit: 2019-02-20 | Discharge: 2019-02-21 | Payer: MEDICARE

## 2019-02-20 DIAGNOSIS — M069 Rheumatoid arthritis, unspecified: Secondary | ICD-10-CM | POA: Diagnosis not present

## 2019-02-20 DIAGNOSIS — M059 Rheumatoid arthritis with rheumatoid factor, unspecified: Secondary | ICD-10-CM | POA: Diagnosis not present

## 2019-03-06 ENCOUNTER — Telehealth: Payer: Self-pay

## 2019-03-06 ENCOUNTER — Encounter: Admit: 2019-03-06 | Discharge: 2019-03-07 | Payer: MEDICARE

## 2019-03-06 DIAGNOSIS — M059 Rheumatoid arthritis with rheumatoid factor, unspecified: Secondary | ICD-10-CM | POA: Diagnosis not present

## 2019-03-06 DIAGNOSIS — M069 Rheumatoid arthritis, unspecified: Principal | ICD-10-CM

## 2019-03-06 NOTE — Telephone Encounter (Signed)
Lmom to confirm 03-10-19 ov as virtual.

## 2019-03-10 ENCOUNTER — Other Ambulatory Visit: Payer: Self-pay

## 2019-03-10 ENCOUNTER — Ambulatory Visit (INDEPENDENT_AMBULATORY_CARE_PROVIDER_SITE_OTHER): Payer: Medicare Other | Admitting: Nurse Practitioner

## 2019-03-10 ENCOUNTER — Encounter: Payer: Self-pay | Admitting: Nurse Practitioner

## 2019-03-10 VITALS — Ht 64.0 in

## 2019-03-10 DIAGNOSIS — M0579 Rheumatoid arthritis with rheumatoid factor of multiple sites without organ or systems involvement: Secondary | ICD-10-CM

## 2019-03-10 DIAGNOSIS — E039 Hypothyroidism, unspecified: Secondary | ICD-10-CM | POA: Diagnosis not present

## 2019-03-10 DIAGNOSIS — C9210 Chronic myeloid leukemia, BCR/ABL-positive, not having achieved remission: Secondary | ICD-10-CM | POA: Diagnosis not present

## 2019-03-10 DIAGNOSIS — E119 Type 2 diabetes mellitus without complications: Secondary | ICD-10-CM | POA: Diagnosis not present

## 2019-03-10 NOTE — Progress Notes (Signed)
[facility] [place], [place] [address]  Internal MEDICINE  Telephone Visit  Patient Name: Laura Chen  [id number]  [id number]  Date of Service: 03/13/2019  I connected with the patient at 2:55pm by telephone and verified the patients identity using two identifiers.   I discussed the limitations, risks, security and privacy concerns of performing an evaluation and management service by telephone and the availability of in person appointments. I also discussed with the patient that there may be a patient responsible charge related to the service.  The patient expressed understanding and agrees to proceed.    Chief Complaint  Patient presents with  . Telephone Assessment  . Telephone Screen  . Diabetes  . Hypertension  . Gastroesophageal Reflux    The patient has been contacted via telephone for follow up visit due to concerns for spread of novel coronavirus. The patient presents for follow up visit. She has diet controlled type 2 diabetes. She does not take medications for this and controls blood sugars through her diet. She last checked her blood sugar was this past Saturday. It was 90. She has no new concerns or complaints today. She does plan to make [place] her GYN provider. She does take norethindrone every day and will need this to be refilled soon. She does have chrolnic and severe rheumatoid arthritis and CML. She sees a rheumatologist and oncologist routinely for management.       Current Medication: Outpatient Encounter Medications as of 03/10/2019  Medication Sig Note  . Accu-Chek FastClix Lancets MISC USE TO TEST BLOOD SUGAR QD UTD DX E11.65   . aspirin EC 81 MG tablet Take 81 mg by mouth every evening.    . baclofen (LIORESAL) 10 MG tablet Take 10 mg by mouth 3 (three) times daily.   Marland Kitchen buPROPion (WELLBUTRIN SR) 100 MG 12 hr tablet Take 1 tablet (100 mg total) by mouth daily.   . cetirizine (ZYRTEC) 10 MG tablet Take 10 mg by mouth.   .  cholecalciferol (VITAMIN D) 1000 units tablet Take 2,000 Units by mouth daily.   . DULoxetine (CYMBALTA) 20 MG capsule Take 1 capsule (20 mg total) by mouth daily.   . Evolocumab (REPATHA) 140 MG/ML SOSY Inject 140 mg into the skin every 14 (fourteen) days.   . fluconazole (DIFLUCAN) 150 MG tablet Take one tablet every other day for 5 doses   . fluticasone (FLONASE) 50 MCG/ACT nasal spray USE 1 SPRAY IN BOTH  NOSTRILS DAILY.   Marland Kitchen glucose blood (ACCU-CHEK GUIDE) test strip USE TO CHECK BLOOD GLUCOSE  EVERY DAY AS DIRECTED   . levothyroxine (SYNTHROID) 50 MCG tablet Take 1 tablet (50 mcg total) by mouth daily.   Marland Kitchen LINZESS 290 MCG CAPS capsule TAKE 1 CAPSULE BY MOUTH  DAILY   . norethindrone (NORLYDA) 0.35 MG tablet Take 1 tablet by mouth daily.   Marland Kitchen omeprazole (PRILOSEC) 20 MG capsule Take 1 capsule (20 mg total) by mouth daily.   . Oxycodone HCl 10 MG TABS Take 10 mg by mouth 3 (three) times daily as needed (for pain).   . ponatinib HCl (ICLUSIG) 15 MG tablet Take 15 mg by mouth daily.  12/09/2018: Reports taking daily  . predniSONE (DELTASONE) 5 MG tablet Take 5 mg by mouth daily with breakfast.   . rosuvastatin (CRESTOR) 5 MG tablet TAKE 1 TABLET BY MOUTH  DAILY WITH SUPPER   . spironolactone (ALDACTONE) 50 MG tablet daily.   . traMADol (ULTRAM) 50 MG tablet Take  2 tablets (100 mg total) by mouth every 12 (twelve) hours as needed.    No facility-administered encounter medications on file as of 03/10/2019.    Surgical History: Past Surgical History:  Procedure Laterality Date  . DEXA  2010   osteopenia  . MYOMECTOMY  2008  . right ankle AFO  2011  . TRANSTHORACIC ECHOCARDIOGRAM  123456   nl systolic/diastolic fxn, EF XX123456, mild MR, normal PA pressures  . US/HSG  05/2010   possible endometrial polyp, rec rpt 8 wks continue loestrin 24 [facility])    Medical History: Past Medical History:  Diagnosis Date  . Asthma   . CML (chronic myeloid leukemia) ([place]) 2011   (Dr. Alyse Low)  stopped gleevec 2/2 side effects  . Diabetes mellitus without complication ([place])   . GERD (gastroesophageal reflux disease)   . History of shingles   . History of syphilis 1990   s/p treatment  . Hypertension   . Leukemia ([place])   . Osteopenia    due to prednisone use, was on reclast  . PUD (peptic ulcer disease)   . Rheumatoid arthritis(714.0) 1990s   Jacoud's arthropathy, previously treated with MTX, TNFa (remicade, enbrel, humira, orencia, rituximab, and actemra)  . SLE (systemic lupus erythematosus) ([place]) 09/2009   Rheum-Dr. Joan Mayans  . Thyroid goiter    Endo-Dr. Eddie Dibbles    Family History: Family History  Problem Relation Age of Onset  . Heart attack Maternal Grandmother   . Coronary artery disease Maternal Grandmother   . Hypertension Mother   . Hypertension Other        Aunt    Social History   Socioeconomic History  . Marital status: Single    Spouse name: Not on file  . Number of children: Not on file  . Years of education: Not on file  . Highest education level: Not on file  Occupational History  . Occupation: Secretary/administrator at [employer]  . Smoking status: Former Smoker    Packs/day: 0.30    Types: Cigarettes  . Smokeless tobacco: Never Used  . Tobacco comment: 6 cigarettes/day  Substance and Sexual Activity  . Alcohol use: No  . Drug use: No  . Sexual activity: Not on file  Other Topics Concern  . Not on file  Social History Narrative   Caffeine: 1 cup/day coffee      Lives with mother, 1 dog      HS education   Social Determinants of Health   Financial Resource Strain: Low Risk   . Difficulty of Paying Living Expenses: Not hard at all  Food Insecurity: No Food Insecurity  . Worried About Charity fundraiser in the Last Year: Never true  . Ran Out of Food in the Last Year: Never true  Transportation Needs: No Transportation Needs  . Lack of Transportation (Medical): No  . Lack of Transportation (Non-Medical): No  Physical Activity:  Insufficiently Active  . Days of Exercise per Week: 3 days  . Minutes of Exercise per Session: 20 min  Stress:   . Feeling of Stress : Not on file  Social Connections:   . Frequency of Communication with Friends and Family: Not on file  . Frequency of Social Gatherings with Friends and Family: Not on file  . Attends Religious Services: Not on file  . Active Member of Clubs or Organizations: Not on file  . Attends Archivist Meetings: Not on file  . Marital Status: Not on file  Intimate Partner  Violence: Not At Risk  . Fear of Current or Ex-Partner: No  . Emotionally Abused: No  . Physically Abused: No  . Sexually Abused: No      Review of Systems  Constitutional: Negative for activity change, chills, fatigue and unexpected weight change.  HENT: Negative for congestion, postnasal drip, rhinorrhea, sneezing and sore throat.   Respiratory: Negative for cough, chest tightness, shortness of breath and wheezing.   Cardiovascular: Negative for chest pain and palpitations.  Gastrointestinal: Negative for abdominal pain, constipation, diarrhea, nausea and vomiting.  Endocrine: Negative for cold intolerance, heat intolerance, polydipsia and polyuria.       Blood sugars doing well  Controlled without medication.  Musculoskeletal: Positive for arthralgias, gait problem and joint swelling. Negative for back pain and neck pain.  Skin: Negative for rash.  Allergic/Immunologic: Negative for environmental allergies.  Neurological: Negative for tremors, numbness and headaches.  Hematological: Negative for adenopathy. Does not bruise/bleed easily.  Psychiatric/Behavioral: Negative for behavioral problems (Depression), sleep disturbance and suicidal ideas. The patient is not nervous/anxious.     Today's Vitals   03/10/19 1424  Height: 5\' 4"  (1.626 m)   Body mass index is 43.26 kg/m.   Observation/Objective:   The patient is alert and oriented. She is pleasant and answers all  questions appropriately. Breathing is non-labored. She is in no acute distress at this time.    Assessment/Plan:  1. Diet-controlled type 2 diabetes mellitus ([place]) Blood sugars are well managed. Continue to control through diet.   2. Acquired hypothyroidism Continue levothyroxine 65mcg daily.  3. Rheumatoid arthritis involving multiple sites with positive rheumatoid factor ([place]) Continue regular visits with rheumatology as scheduled.   4. Chronic myeloid leukemia ([place]) Continue regular visits with oncology as scheduled.    General Counseling: Skyleen verbalizes understanding of the findings of today's phone visit and agrees with plan of treatment. I have discussed any further diagnostic evaluation that may be needed or ordered today. We also reviewed her medications today. she has been encouraged to call the office with any questions or concerns that should arise related to todays visit.   This patient was seen by Leretha Pol FNP Collaboration with Dr Lavera Guise as a part of collaborative care agreement  Time spent: 25 Minutes    Dr Lavera Guise Internal medicine

## 2019-03-11 DIAGNOSIS — M170 Bilateral primary osteoarthritis of knee: Principal | ICD-10-CM

## 2019-03-11 MED ORDER — OXYCODONE 10 MG TABLET
ORAL_TABLET | Freq: Three times a day (TID) | ORAL | 0 refills | 30 days | Status: CP | PRN
Start: 2019-03-11 — End: ?

## 2019-03-12 ENCOUNTER — Other Ambulatory Visit: Payer: Self-pay | Admitting: *Deleted

## 2019-03-12 ENCOUNTER — Other Ambulatory Visit: Payer: Self-pay

## 2019-03-12 DIAGNOSIS — C9210 Chronic myeloid leukemia, BCR/ABL-positive, not having achieved remission: Principal | ICD-10-CM

## 2019-03-12 MED ORDER — PREDNISONE 10 MG (21) PO TBPK: ORAL_TABLET | ORAL | 0 refills | Status: DC

## 2019-03-12 NOTE — Telephone Encounter (Signed)
Pt called that she having sciatica pain,back pain and going pain to leg leg as per heather advised pt that she need to call rheumatology to we send prednisone taper for 6 days and also she had tramadol and other pain med she is taking advised her to hold prednisone 5 mg while she on taper and also advised if she is not feeling better she need to been seen and also talk to her rheumatology

## 2019-03-12 NOTE — Patient Outreach (Signed)
[facility]) Care Management  03/12/2019  Laura Chen 1965/04/09 [id number]   RN Health Coach Quarterly Outreach  Referral Date:08/09/2018 Referral Source:UHC High Risk List Reason for Referral:Assess Needs Insurance:[employer] Medicare   Outreach Attempt:  Outreach attempt #1 to patient for follow up.  Patient answered and stated she is having a sciatica flare and would like call back on another day.   Plan:  RN Health Coach will make another outreach attempt within the month of February.  [place] Coach [phone] [email or web address]

## 2019-03-13 ENCOUNTER — Encounter
Admit: 2019-03-13 | Discharge: 2019-03-14 | Payer: MEDICARE | Attending: Hematology & Oncology | Primary: Hematology & Oncology

## 2019-03-13 ENCOUNTER — Encounter: Admit: 2019-03-13 | Discharge: 2019-03-14 | Payer: MEDICARE

## 2019-03-13 DIAGNOSIS — E611 Iron deficiency: Secondary | ICD-10-CM | POA: Diagnosis not present

## 2019-03-13 DIAGNOSIS — C9210 Chronic myeloid leukemia, BCR/ABL-positive, not having achieved remission: Secondary | ICD-10-CM | POA: Diagnosis not present

## 2019-03-13 DIAGNOSIS — R0602 Shortness of breath: Secondary | ICD-10-CM | POA: Diagnosis not present

## 2019-03-13 DIAGNOSIS — N939 Abnormal uterine and vaginal bleeding, unspecified: Principal | ICD-10-CM

## 2019-03-13 MED ORDER — NORLYDA 0.35 MG TABLET
ORAL_TABLET | 1 refills | 0 days | Status: CP
Start: 2019-03-13 — End: ?

## 2019-03-23 IMAGING — DX DG CHEST 1V PORT
1 series · 1 of 1 positions shown · non-contrast
Comparison: Chest radiograph April 30, 2015

CLINICAL DATA: Dehydration, anorexia for 3 days.

EXAM:
PORTABLE CHEST 1 VIEW

[chest ap]
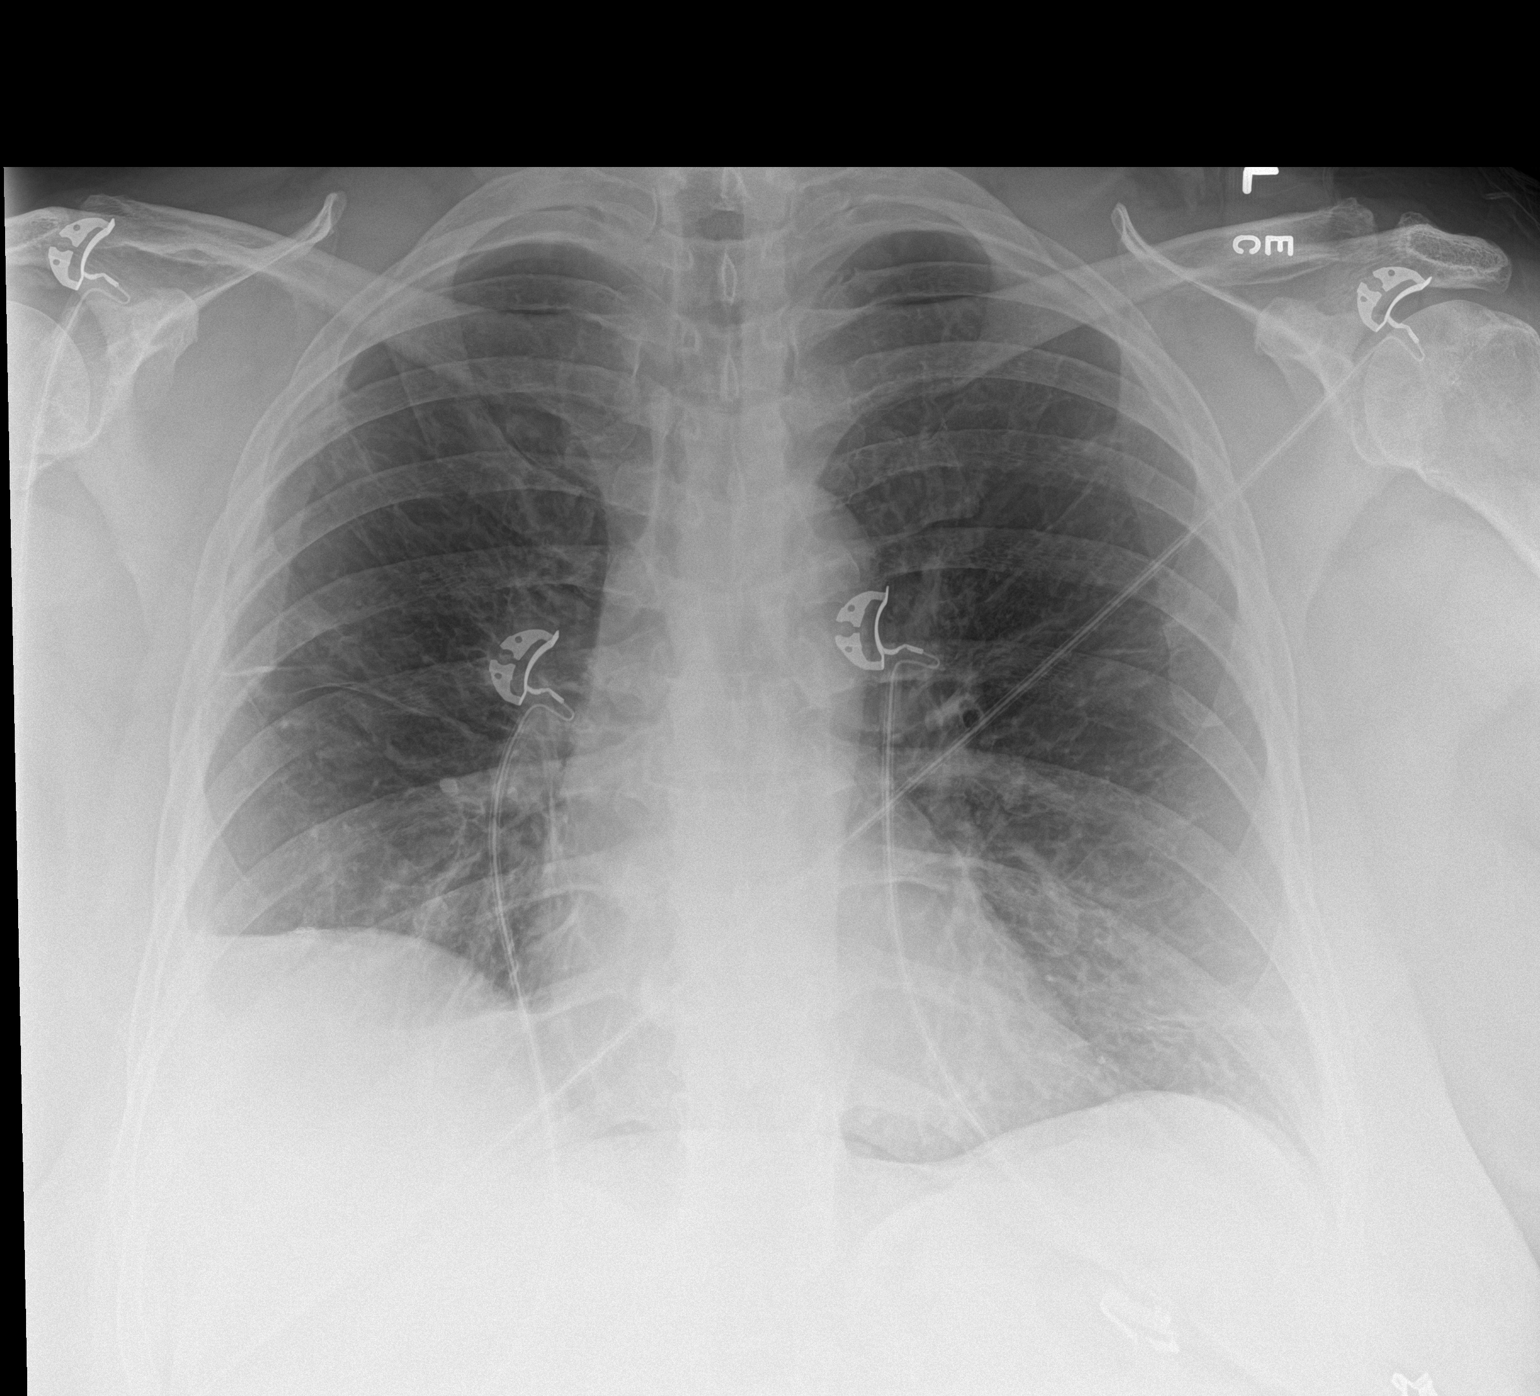

[1 of 1 positions shown; findings below may reference images not displayed]

FINDINGS: Cardiomediastinal silhouette is normal. Persistently elevated RIGHT
hemidiaphragm with juxta peaking. Bronchitic changes and strandy
densities LEFT lung base. RIGHT major fissure pleural thickening. No
focal consolidation. No pneumothorax. Soft tissue planes and
included osseous structures are non suspicious.
IMPRESSION: Bronchitic changes with LEFT lung base atelectasis/scarring.

Persistently elevated RIGHT hemidiaphragm with pleural thickening,
less likely small pleural effusion.

## 2019-03-24 ENCOUNTER — Ambulatory Visit (INDEPENDENT_AMBULATORY_CARE_PROVIDER_SITE_OTHER): Payer: Medicare Other | Admitting: Adult Health

## 2019-03-24 ENCOUNTER — Telehealth: Payer: Self-pay

## 2019-03-24 ENCOUNTER — Encounter: Payer: Self-pay | Admitting: Adult Health

## 2019-03-24 VITALS — Ht 64.0 in | Wt 242.0 lb

## 2019-03-24 DIAGNOSIS — E119 Type 2 diabetes mellitus without complications: Secondary | ICD-10-CM

## 2019-03-24 DIAGNOSIS — C9210 Chronic myeloid leukemia, BCR/ABL-positive, not having achieved remission: Secondary | ICD-10-CM | POA: Diagnosis not present

## 2019-03-24 DIAGNOSIS — R269 Unspecified abnormalities of gait and mobility: Secondary | ICD-10-CM

## 2019-03-24 DIAGNOSIS — M0579 Rheumatoid arthritis with rheumatoid factor of multiple sites without organ or systems involvement: Secondary | ICD-10-CM | POA: Diagnosis not present

## 2019-03-24 NOTE — Progress Notes (Signed)
[facility] [place],  [address]  Internal MEDICINE  Telephone Visit  Patient Name: Laura Chen  [id number]  [id number]  Date of Service: 03/24/2019  I connected with the patient at 920 by telephone and verified the patients identity using two identifiers.   I discussed the limitations, risks, security and privacy concerns of performing an evaluation and management service by telephone and the availability of in person appointments. I also discussed with the patient that there may be a patient responsible charge related to the service.  The patient expressed understanding and agrees to proceed.    Chief Complaint  Patient presents with  . Telephone Assessment  . Telephone Screen  . Orders    NEW ROLLATOR    HPI  Pt is seen via video. She reports needing a new rollator walker.  Her current rollator is 52 years old.  The seat is broken and the brakes no longer work.  She reports having fallen while using it because the brakes are not working well.  Luckily she was unharmed.  She denies any other issues at this time. She has a history of rheumatoid arthritis, CML, and DMII.  She denies any new or worsening issues with these conditions.     Current Medication: Outpatient Encounter Medications as of 03/24/2019  Medication Sig Note  . Accu-Chek FastClix Lancets MISC USE TO TEST BLOOD SUGAR QD UTD DX E11.65   . aspirin EC 81 MG tablet Take 81 mg by mouth every evening.    . baclofen (LIORESAL) 10 MG tablet Take 10 mg by mouth 3 (three) times daily.   Marland Kitchen buPROPion (WELLBUTRIN SR) 100 MG 12 hr tablet Take 1 tablet (100 mg total) by mouth daily.   . cetirizine (ZYRTEC) 10 MG tablet Take 10 mg by mouth.   . cholecalciferol (VITAMIN D) 1000 units tablet Take 2,000 Units by mouth daily.   . DULoxetine (CYMBALTA) 20 MG capsule Take 1 capsule (20 mg total) by mouth daily.   . Evolocumab (REPATHA) 140 MG/ML SOSY Inject 140 mg into the skin every 14 (fourteen) days.    . fluconazole (DIFLUCAN) 150 MG tablet Take one tablet every other day for 5 doses   . fluticasone (FLONASE) 50 MCG/ACT nasal spray USE 1 SPRAY IN BOTH  NOSTRILS DAILY.   Marland Kitchen glucose blood (ACCU-CHEK GUIDE) test strip USE TO CHECK BLOOD GLUCOSE  EVERY DAY AS DIRECTED   . levothyroxine (SYNTHROID) 50 MCG tablet Take 1 tablet (50 mcg total) by mouth daily.   Marland Kitchen LINZESS 290 MCG CAPS capsule TAKE 1 CAPSULE BY MOUTH  DAILY   . norethindrone (NORLYDA) 0.35 MG tablet Take 1 tablet by mouth daily.   Marland Kitchen omeprazole (PRILOSEC) 20 MG capsule Take 1 capsule (20 mg total) by mouth daily.   . Oxycodone HCl 10 MG TABS Take 10 mg by mouth 3 (three) times daily as needed (for pain).   . ponatinib HCl (ICLUSIG) 15 MG tablet Take 15 mg by mouth daily.  12/09/2018: Reports taking daily  . predniSONE (DELTASONE) 5 MG tablet Take 5 mg by mouth daily with breakfast.   . predniSONE (STERAPRED UNI-PAK 21 TAB) 10 MG (21) TBPK tablet 1Use as directed for 6 days for sciatica pain and hold prednisone 5 mg when she is on taper   . rosuvastatin (CRESTOR) 5 MG tablet TAKE 1 TABLET BY MOUTH  DAILY WITH SUPPER   . spironolactone (ALDACTONE) 50 MG tablet daily.   . traMADol (ULTRAM) 50 MG tablet  Take 2 tablets (100 mg total) by mouth every 12 (twelve) hours as needed.    No facility-administered encounter medications on file as of 03/24/2019.    Surgical History: Past Surgical History:  Procedure Laterality Date  . DEXA  2010   osteopenia  . MYOMECTOMY  2008  . right ankle AFO  2011  . TRANSTHORACIC ECHOCARDIOGRAM  123456   nl systolic/diastolic fxn, EF XX123456, mild MR, normal PA pressures  . US/HSG  05/2010   possible endometrial polyp, rec rpt 8 wks continue loestrin 24 [facility])    Medical History: Past Medical History:  Diagnosis Date  . Asthma   . CML (chronic myeloid leukemia) ([place]) 2011   (Dr. Alyse Low) stopped gleevec 2/2 side effects  . Diabetes mellitus without complication ([place])   . GERD  (gastroesophageal reflux disease)   . History of shingles   . History of syphilis 1990   s/p treatment  . Hypertension   . Leukemia ([place])   . Osteopenia    due to prednisone use, was on reclast  . PUD (peptic ulcer disease)   . Rheumatoid arthritis(714.0) 1990s   Jacoud's arthropathy, previously treated with MTX, TNFa (remicade, enbrel, humira, orencia, rituximab, and actemra)  . SLE (systemic lupus erythematosus) ([place]) 09/2009   Rheum-Dr. Joan Mayans  . Thyroid goiter    Endo-Dr. Eddie Dibbles    Family History: Family History  Problem Relation Age of Onset  . Heart attack Maternal Grandmother   . Coronary artery disease Maternal Grandmother   . Hypertension Mother   . Hypertension Other        Aunt    Social History   Socioeconomic History  . Marital status: Single    Spouse name: Not on file  . Number of children: Not on file  . Years of education: Not on file  . Highest education level: Not on file  Occupational History  . Occupation: Secretary/administrator at [employer]  . Smoking status: Former Smoker    Packs/day: 0.30    Types: Cigarettes  . Smokeless tobacco: Never Used  . Tobacco comment: 6 cigarettes/day  Substance and Sexual Activity  . Alcohol use: No  . Drug use: No  . Sexual activity: Not on file  Other Topics Concern  . Not on file  Social History Narrative   Caffeine: 1 cup/day coffee      Lives with mother, 1 dog      HS education   Social Determinants of Health   Financial Resource Strain: Low Risk   . Difficulty of Paying Living Expenses: Not hard at all  Food Insecurity: No Food Insecurity  . Worried About Charity fundraiser in the Last Year: Never true  . Ran Out of Food in the Last Year: Never true  Transportation Needs: No Transportation Needs  . Lack of Transportation (Medical): No  . Lack of Transportation (Non-Medical): No  Physical Activity: Insufficiently Active  . Days of Exercise per Week: 3 days  . Minutes of Exercise per  Session: 20 min  Stress:   . Feeling of Stress : Not on file  Social Connections:   . Frequency of Communication with Friends and Family: Not on file  . Frequency of Social Gatherings with Friends and Family: Not on file  . Attends Religious Services: Not on file  . Active Member of Clubs or Organizations: Not on file  . Attends Archivist Meetings: Not on file  . Marital Status: Not on file  Intimate  Partner Violence: Not At Risk  . Fear of Current or Ex-Partner: No  . Emotionally Abused: No  . Physically Abused: No  . Sexually Abused: No      Review of Systems  Constitutional: Negative for chills, fatigue and unexpected weight change.  HENT: Negative for congestion, rhinorrhea, sneezing and sore throat.   Eyes: Negative for photophobia, pain and redness.  Respiratory: Negative for cough, chest tightness and shortness of breath.   Cardiovascular: Negative for chest pain and palpitations.  Gastrointestinal: Negative for abdominal pain, constipation, diarrhea, nausea and vomiting.  Endocrine: Negative.   Genitourinary: Negative for dysuria and frequency.  Musculoskeletal: Negative for arthralgias, back pain, joint swelling and neck pain.  Skin: Negative for rash.  Allergic/Immunologic: Negative.   Neurological: Negative for tremors and numbness.  Hematological: Negative for adenopathy. Does not bruise/bleed easily.  Psychiatric/Behavioral: Negative for behavioral problems and sleep disturbance. The patient is not nervous/anxious.     Vital Signs: Ht 5\' 4"  (1.626 m)   Wt 242 lb (109.8 kg)   BMI 41.54 kg/m    Observation/Objective:  Well appearing, NAD noted.    Assessment/Plan: 1. Rheumatoid arthritis involving multiple sites with positive rheumatoid factor (HCC) Stable, continue current management. -Rollator walker ordered on paper  2. Chronic myeloid leukemia (HCC) Stable, continue to follow up with hematology as before.  3. Diet-controlled type 2  diabetes mellitus ([place]) Continue present management.  4. Morbid obesity ([place]) Obesity Counseling: Risk Assessment: An assessment of behavioral risk factors was made today and includes lack of exercise sedentary lifestyle, lack of portion control and poor dietary habits.  Risk Modification Advice: She was counseled on portion control guidelines. Restricting daily caloric intake to 1800. The detrimental long term effects of obesity on her health and ongoing poor compliance was also discussed with the patient.    General Counseling: [place] verbalizes understanding of the findings of today's phone visit and agrees with plan of treatment. I have discussed any further diagnostic evaluation that may be needed or ordered today. We also reviewed her medications today. she has been encouraged to call the office with any questions or concerns that should arise related to todays visit.    No orders of the defined types were placed in this encounter.   No orders of the defined types were placed in this encounter.   Time spent: [place] [facility] Internal medicine

## 2019-03-24 NOTE — Telephone Encounter (Signed)
Spoke with [facility] and put order through epic for rollator walker with seat

## 2019-03-26 ENCOUNTER — Encounter: Admit: 2019-03-26 | Discharge: 2019-03-27 | Payer: MEDICARE

## 2019-03-26 DIAGNOSIS — M5137 Other intervertebral disc degeneration, lumbosacral region: Secondary | ICD-10-CM | POA: Diagnosis not present

## 2019-03-26 DIAGNOSIS — C9210 Chronic myeloid leukemia, BCR/ABL-positive, not having achieved remission: Secondary | ICD-10-CM | POA: Diagnosis not present

## 2019-03-26 DIAGNOSIS — M545 Low back pain: Secondary | ICD-10-CM | POA: Diagnosis not present

## 2019-03-27 ENCOUNTER — Other Ambulatory Visit: Payer: Self-pay | Admitting: Nurse Practitioner

## 2019-03-27 DIAGNOSIS — E039 Hypothyroidism, unspecified: Secondary | ICD-10-CM | POA: Diagnosis not present

## 2019-03-28 LAB — T4, FREE: Free T4: 0.89 ng/dL (ref 0.82–1.77)

## 2019-03-28 LAB — TSH: TSH: 1.7 u[IU]/mL (ref 0.450–4.500)

## 2019-03-28 LAB — T3: T3, Total: 122 ng/dL (ref 71–180)

## 2019-03-30 NOTE — Progress Notes (Signed)
Please let the patient know that her thyroid panel was normal.

## 2019-03-31 ENCOUNTER — Other Ambulatory Visit: Payer: Self-pay

## 2019-03-31 ENCOUNTER — Telehealth: Payer: Self-pay

## 2019-03-31 DIAGNOSIS — R141 Gas pain: Secondary | ICD-10-CM

## 2019-03-31 DIAGNOSIS — F331 Major depressive disorder, recurrent, moderate: Secondary | ICD-10-CM

## 2019-03-31 DIAGNOSIS — R143 Flatulence: Secondary | ICD-10-CM

## 2019-03-31 DIAGNOSIS — E1165 Type 2 diabetes mellitus with hyperglycemia: Secondary | ICD-10-CM

## 2019-03-31 MED ORDER — ACCU-CHEK GUIDE VI STRP: ORAL_STRIP | 1 refills | Status: DC

## 2019-03-31 MED ORDER — LINACLOTIDE 290 MCG PO CAPS: 290.0000 ug | ORAL_CAPSULE | Freq: Every day | ORAL | 2 refills | Status: DC

## 2019-03-31 MED ORDER — OMEPRAZOLE 20 MG PO CPDR: 20.0000 mg | DELAYED_RELEASE_CAPSULE | Freq: Every day | ORAL | 1 refills | Status: DC

## 2019-03-31 MED ORDER — DULOXETINE HCL 20 MG PO CPEP: 20.0000 mg | ORAL_CAPSULE | Freq: Every day | ORAL | 1 refills | Status: DC

## 2019-03-31 NOTE — Telephone Encounter (Signed)
Pt was notified.  

## 2019-03-31 NOTE — Telephone Encounter (Signed)
-----   Message from Ronnell Freshwater, NP sent at 03/30/2019  6:23 PM EST ----- Please let the patient know that her thyroid panel was normal.

## 2019-04-04 ENCOUNTER — Other Ambulatory Visit: Payer: Self-pay

## 2019-04-04 DIAGNOSIS — M0579 Rheumatoid arthritis with rheumatoid factor of multiple sites without organ or systems involvement: Secondary | ICD-10-CM

## 2019-04-04 MED ORDER — TRAMADOL HCL 50 MG PO TABS: 100.0000 mg | ORAL_TABLET | Freq: Two times a day (BID) | ORAL | 1 refills | Status: DC | PRN

## 2019-04-07 ENCOUNTER — Other Ambulatory Visit: Payer: Self-pay | Admitting: *Deleted

## 2019-04-07 NOTE — Patient Outreach (Signed)
[facility]) Care Management  04/07/2019  Laura Chen 09/30/1965 [id number]   RN Health Coach Quarterly Outreach  Referral Date:08/09/2018 Referral Source:UHC High Risk List Reason for Referral:Assess Needs Insurance:[employer] Medicare   Outreach Attempt:  Outreach attempt #2 to patient for follow up. No answer. RN Health Coach left HIPAA compliant voicemail message along with contact information.  Plan:  RN Health Coach will make another outreach attempt within the month of March if no return call back from patient.  [place] [phone] [email or web address]

## 2019-04-12 DIAGNOSIS — M170 Bilateral primary osteoarthritis of knee: Principal | ICD-10-CM

## 2019-04-12 MED ORDER — OXYCODONE 10 MG TABLET
ORAL_TABLET | Freq: Three times a day (TID) | ORAL | 0 refills | 30 days | Status: CP | PRN
Start: 2019-04-12 — End: ?

## 2019-04-21 ENCOUNTER — Ambulatory Visit: Admit: 2019-04-21 | Discharge: 2019-04-22 | Attending: Internal Medicine | Primary: Internal Medicine

## 2019-04-21 DIAGNOSIS — R202 Paresthesia of skin: Principal | ICD-10-CM

## 2019-04-21 DIAGNOSIS — M059 Rheumatoid arthritis with rheumatoid factor, unspecified: Principal | ICD-10-CM

## 2019-04-21 DIAGNOSIS — M545 Low back pain: Principal | ICD-10-CM

## 2019-04-23 MED ORDER — METRONIDAZOLE 0.75 % VAGINAL GEL
0 days
Start: 2019-04-23 — End: ?

## 2019-05-01 ENCOUNTER — Other Ambulatory Visit: Payer: Self-pay

## 2019-05-01 MED ORDER — FLUTICASONE PROPIONATE 50 MCG/ACT NA SUSP: NASAL | 1 refills | Status: DC

## 2019-05-02 ENCOUNTER — Encounter: Admit: 2019-05-02 | Discharge: 2019-05-03 | Payer: MEDICARE

## 2019-05-02 DIAGNOSIS — R202 Paresthesia of skin: Secondary | ICD-10-CM | POA: Diagnosis not present

## 2019-05-02 DIAGNOSIS — M5127 Other intervertebral disc displacement, lumbosacral region: Secondary | ICD-10-CM | POA: Diagnosis not present

## 2019-05-02 DIAGNOSIS — M154 Erosive (osteo)arthritis: Secondary | ICD-10-CM | POA: Diagnosis not present

## 2019-05-07 DIAGNOSIS — M545 Low back pain: Principal | ICD-10-CM

## 2019-05-07 DIAGNOSIS — M059 Rheumatoid arthritis with rheumatoid factor, unspecified: Principal | ICD-10-CM

## 2019-05-07 DIAGNOSIS — R202 Paresthesia of skin: Principal | ICD-10-CM

## 2019-05-08 ENCOUNTER — Telehealth: Payer: Self-pay

## 2019-05-08 NOTE — Telephone Encounter (Signed)
Confirmed appointment on 05/12/2019 and screened for covid. klh 

## 2019-05-09 DIAGNOSIS — M170 Bilateral primary osteoarthritis of knee: Principal | ICD-10-CM

## 2019-05-09 MED ORDER — OXYCODONE 10 MG TABLET
ORAL_TABLET | Freq: Three times a day (TID) | ORAL | 0 refills | 30.00000 days | Status: CP | PRN
Start: 2019-05-09 — End: ?

## 2019-05-12 ENCOUNTER — Ambulatory Visit: Payer: Medicare Other | Admitting: Internal Medicine

## 2019-05-14 ENCOUNTER — Other Ambulatory Visit: Payer: Self-pay | Admitting: *Deleted

## 2019-05-14 NOTE — Patient Outreach (Signed)
[facility]) Care Management  05/14/2019  Laura Chen 11-17-1965 [id number]   RN Health Coach Quarterly Outreach  Referral Date:08/09/2018 Referral Source:UHC High Risk List Reason for Referral:Assess Needs Insurance:[employer] Medicare   Outreach Attempt:  Outreach attempt #3 to patient for follow up. No answer. RN Health Coach left HIPAA compliant voicemail message along with contact information.  Plan:  RN Health Coach will make another outreach attempt within the month of April if no return call back from patient.  [place] [phone] [email or web address]

## 2019-05-21 ENCOUNTER — Encounter: Admit: 2019-05-21 | Discharge: 2019-05-22 | Payer: MEDICARE

## 2019-05-21 ENCOUNTER — Encounter
Admit: 2019-05-21 | Discharge: 2019-05-22 | Payer: MEDICARE | Attending: Hematology & Oncology | Primary: Hematology & Oncology

## 2019-05-21 DIAGNOSIS — M069 Rheumatoid arthritis, unspecified: Secondary | ICD-10-CM | POA: Diagnosis not present

## 2019-05-21 DIAGNOSIS — C9210 Chronic myeloid leukemia, BCR/ABL-positive, not having achieved remission: Secondary | ICD-10-CM | POA: Diagnosis not present

## 2019-05-21 DIAGNOSIS — K859 Acute pancreatitis without necrosis or infection, unspecified: Secondary | ICD-10-CM | POA: Diagnosis not present

## 2019-05-21 DIAGNOSIS — E611 Iron deficiency: Principal | ICD-10-CM

## 2019-05-21 DIAGNOSIS — M059 Rheumatoid arthritis with rheumatoid factor, unspecified: Principal | ICD-10-CM

## 2019-05-21 DIAGNOSIS — D509 Iron deficiency anemia, unspecified: Principal | ICD-10-CM

## 2019-05-22 ENCOUNTER — Telehealth: Payer: Self-pay

## 2019-05-22 NOTE — Telephone Encounter (Signed)
Confirmed appointment on 05/27/2019 and screened for covid. klh 

## 2019-05-27 ENCOUNTER — Ambulatory Visit: Payer: Medicare Other | Admitting: Internal Medicine

## 2019-05-27 ENCOUNTER — Encounter: Payer: Self-pay | Admitting: Internal Medicine

## 2019-05-27 ENCOUNTER — Other Ambulatory Visit: Payer: Self-pay

## 2019-05-27 VITALS — BP 145/81 | HR 74 | Temp 92.8°F | Resp 16 | Ht 64.0 in | Wt 253.2 lb

## 2019-05-27 DIAGNOSIS — R0602 Shortness of breath: Secondary | ICD-10-CM

## 2019-05-27 DIAGNOSIS — Z6841 Body Mass Index (BMI) 40.0 and over, adult: Secondary | ICD-10-CM | POA: Diagnosis not present

## 2019-05-27 DIAGNOSIS — M0579 Rheumatoid arthritis with rheumatoid factor of multiple sites without organ or systems involvement: Secondary | ICD-10-CM

## 2019-05-27 DIAGNOSIS — J984 Other disorders of lung: Secondary | ICD-10-CM

## 2019-05-27 DIAGNOSIS — J449 Chronic obstructive pulmonary disease, unspecified: Secondary | ICD-10-CM

## 2019-05-27 DIAGNOSIS — J189 Pneumonia, unspecified organism: Secondary | ICD-10-CM

## 2019-05-27 NOTE — Progress Notes (Signed)
[facility] [place], [place] [address]  Pulmonary Sleep Medicine   Office Visit Note  Patient Name: Laura Chen DOB: 10/21/1965 MRN [id number]  Date of Service: 05/27/2019  Complaints/HPI: PT is here for Pulmonary follow up.  She continues to deny shortness of breath.  She reports she breaths hard due to her RA pain at times, however she does not feel like she has trouble getting oxygen.  She walks with a walker.  She does not use any oxygen or cpap.   ROS  General: (-) fever, (-) chills, (-) night sweats, (-) weakness Skin: (-) rashes, (-) itching,. Eyes: (-) visual changes, (-) redness, (-) itching. Nose and Sinuses: (-) nasal stuffiness or itchiness, (-) postnasal drip, (-) nosebleeds, (-) sinus trouble. Mouth and Throat: (-) sore throat, (-) hoarseness. Neck: (-) swollen glands, (-) enlarged thyroid, (-) neck pain. Respiratory: - cough, (-) bloody sputum, - shortness of breath, - wheezing. Cardiovascular: -+ankle swelling, (-) chest pain. Lymphatic: (-) lymph node enlargement. Neurologic: (-) numbness, (-) tingling. Psychiatric: (-) anxiety, (-) depression   Current Medication: Outpatient Encounter Medications as of 05/27/2019  Medication Sig Note  . Accu-Chek FastClix Lancets MISC USE TO TEST BLOOD SUGAR QD UTD DX E11.65   . aspirin EC 81 MG tablet Take 81 mg by mouth every evening.    . baclofen (LIORESAL) 10 MG tablet Take 10 mg by mouth 3 (three) times daily.   Marland Kitchen buPROPion (WELLBUTRIN SR) 100 MG 12 hr tablet Take 1 tablet (100 mg total) by mouth daily.   . cetirizine (ZYRTEC) 10 MG tablet Take 10 mg by mouth.   . cholecalciferol (VITAMIN D) 1000 units tablet Take 2,000 Units by mouth daily.   . DULoxetine (CYMBALTA) 20 MG capsule Take 1 capsule (20 mg total) by mouth daily.   . Evolocumab (REPATHA) 140 MG/ML SOSY Inject 140 mg into the skin every 14 (fourteen) days.   . fluconazole (DIFLUCAN) 150 MG tablet Take one tablet every other day for  5 doses   . fluticasone (FLONASE) 50 MCG/ACT nasal spray USE 1 SPRAY IN BOTH  NOSTRILS DAILY.   Marland Kitchen glucose blood (ACCU-CHEK GUIDE) test strip USE TO CHECK BLOOD GLUCOSE  EVERY DAY AS DIRECTED   . levothyroxine (SYNTHROID) 50 MCG tablet Take 1 tablet (50 mcg total) by mouth daily.   Marland Kitchen linaclotide (LINZESS) 290 MCG CAPS capsule Take 1 capsule (290 mcg total) by mouth daily.   . norethindrone (NORLYDA) 0.35 MG tablet Take 1 tablet by mouth daily.   Marland Kitchen omeprazole (PRILOSEC) 20 MG capsule Take 1 capsule (20 mg total) by mouth daily.   . Oxycodone HCl 10 MG TABS Take 10 mg by mouth 3 (three) times daily as needed (for pain).   . ponatinib HCl (ICLUSIG) 15 MG tablet Take 15 mg by mouth daily.  12/09/2018: Reports taking daily  . predniSONE (DELTASONE) 5 MG tablet Take 5 mg by mouth daily with breakfast.   . predniSONE (STERAPRED UNI-PAK 21 TAB) 10 MG (21) TBPK tablet 1Use as directed for 6 days for sciatica pain and hold prednisone 5 mg when she is on taper   . rosuvastatin (CRESTOR) 5 MG tablet TAKE 1 TABLET BY MOUTH  DAILY WITH SUPPER   . spironolactone (ALDACTONE) 50 MG tablet daily.   . traMADol (ULTRAM) 50 MG tablet Take 2 tablets (100 mg total) by mouth every 12 (twelve) hours as needed.    No facility-administered encounter medications on file as of 05/27/2019.  Surgical History: Past Surgical History:  Procedure Laterality Date  . DEXA  2010   osteopenia  . MYOMECTOMY  2008  . right ankle AFO  2011  . TRANSTHORACIC ECHOCARDIOGRAM  123456   nl systolic/diastolic fxn, EF [id number], mild MR, normal PA pressures  . US/HSG  05/2010   possible endometrial polyp, rec rpt 8 wks continue loestrin 24 [facility])    Medical History: Past Medical History:  Diagnosis Date  . Asthma   . CML (chronic myeloid leukemia) ([place]) 2011   (Dr. Alyse Low) stopped gleevec 2/2 side effects  . Diabetes mellitus without complication ([place])   . GERD (gastroesophageal reflux disease)   . History of shingles    . History of syphilis 1990   s/p treatment  . Hypertension   . Leukemia ([place])   . Osteopenia    due to prednisone use, was on reclast  . PUD (peptic ulcer disease)   . Rheumatoid arthritis(714.0) 1990s   Jacoud's arthropathy, previously treated with MTX, TNFa (remicade, enbrel, humira, orencia, rituximab, and actemra)  . SLE (systemic lupus erythematosus) ([place]) 09/2009   Rheum-Dr. Joan Mayans  . Thyroid goiter    Endo-Dr. Eddie Dibbles    Family History: Family History  Problem Relation Age of Onset  . Heart attack Maternal Grandmother   . Coronary artery disease Maternal Grandmother   . Hypertension Mother   . Hypertension Other        Aunt    Social History: Social History   Socioeconomic History  . Marital status: Single    Spouse name: Not on file  . Number of children: Not on file  . Years of education: Not on file  . Highest education level: Not on file  Occupational History  . Occupation: Secretary/administrator at [employer]  . Smoking status: Former Smoker    Packs/day: 0.30    Types: Cigarettes  . Smokeless tobacco: Never Used  . Tobacco comment: 6 cigarettes/day  Substance and Sexual Activity  . Alcohol use: No  . Drug use: No  . Sexual activity: Not on file  Other Topics Concern  . Not on file  Social History Narrative   Caffeine: 1 cup/day coffee      Lives with mother, 1 dog      HS education   Social Determinants of Health   Financial Resource Strain: Low Risk   . Difficulty of Paying Living Expenses: Not hard at all  Food Insecurity: No Food Insecurity  . Worried About Charity fundraiser in the Last Year: Never true  . Ran Out of Food in the Last Year: Never true  Transportation Needs: No Transportation Needs  . Lack of Transportation (Medical): No  . Lack of Transportation (Non-Medical): No  Physical Activity: Insufficiently Active  . Days of Exercise per Week: 3 days  . Minutes of Exercise per Session: 20 min  Stress:   . Feeling of  Stress :   Social Connections:   . Frequency of Communication with Friends and Family:   . Frequency of Social Gatherings with Friends and Family:   . Attends Religious Services:   . Active Member of Clubs or Organizations:   . Attends Archivist Meetings:   Marland Kitchen Marital Status:   Intimate Partner Violence: Not At Risk  . Fear of Current or Ex-Partner: No  . Emotionally Abused: No  . Physically Abused: No  . Sexually Abused: No    Vital Signs: Blood pressure (!) 145/81, pulse 74, temperature (!)  92.8 F (33.8 C), resp. rate 16, height 5\' 4"  (1.626 m), weight 253 lb 3.2 oz (114.9 kg), SpO2 95 %.  Examination: General Appearance: The patient is well-developed, well-nourished, and in no distress. Skin: Gross inspection of skin unremarkable. Head: normocephalic, no gross deformities. Eyes: no gross deformities noted. ENT: ears appear grossly normal no exudates. Neck: Supple. No thyromegaly. No LAD. Respiratory: clear bilaterally. Cardiovascular: Normal S1 and S2 without murmur or rub. Extremities: No cyanosis. pulses are equal. Neurologic: Alert and oriented. No involuntary movements.  LABS: Recent Results (from the past 2160 hour(s))  T4, free     Status: None   Collection Time: 03/27/19  2:40 PM  Result Value Ref Range   Free T4 0.89 0.82 - 1.77 ng/dL  TSH     Status: None   Collection Time: 03/27/19  2:40 PM  Result Value Ref Range   TSH 1.700 0.450 - 4.500 uIU/mL  T3     Status: None   Collection Time: 03/27/19  2:40 PM  Result Value Ref Range   T3, Total 122 71 - 180 ng/dL    Radiology: DG Chest 2 View  Result Date: 06/28/2017 CLINICAL DATA:  Cough and congestion with body aches. History of leukemia. EXAM: CHEST - 2 VIEW COMPARISON:  Chest x-ray dated April 13, 2017. FINDINGS: The heart size and mediastinal contours are within normal limits. Normal pulmonary vascularity. Unchanged eventration of the anterior right hemidiaphragm with blunting of the right  costophrenic angle. Bronchitic changes appear slightly worsened when compared to prior study. Stable scarring/atelectasis at the left lung base. No focal consolidation, pleural effusion, or pneumothorax. No acute osseous abnormality. IMPRESSION: Slightly worsened bronchitic changes.  No consolidation. Electronically Signed   By: Titus Dubin M.D.   On: 06/28/2017 09:35    No results found.  No results found.    Assessment and Plan: Patient Active Problem List   Diagnosis Date Noted  . Diet-controlled type 2 diabetes mellitus ([place]) 12/18/2018  . Acute cystitis with hematuria 12/18/2018  . Vaginal discharge 11/18/2018  . Screening for breast cancer 09/15/2018  . Acquired hypothyroidism 09/15/2018  . Vitamin D deficiency 09/15/2018  . 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 Iron deficiency anemia 06/16/2018  . Acute vaginitis 02/24/2018  . Hematuria 01/23/2018  . Bacterial vaginitis 01/23/2018  . Uncontrolled type 2 diabetes mellitus with hyperglycemia ([place]) 12/16/2017  . Mixed hyperlipidemia 12/16/2017  . Depression, major, recurrent, moderate ([place]) 12/16/2017  . Urinary tract infection without hematuria 11/11/2017  . Dysuria 11/11/2017  . Vaginal candidiasis 11/11/2017  . Episode of moderate major depression ([place]) 11/11/2017  . Hypertension 08/14/2017  . Hypokalemia 08/14/2017  . Sepsis ([place]) 06/28/2017  . Acute on chronic kidney failure ([place]) 04/14/2017  . (HFpEF) heart failure with preserved ejection fraction ([place]) 12/12/2016  . Other chest pain 12/12/2016  . Pneumonia 04/30/2015  . Acute renal failure (ARF) ([place]) 04/24/2015  . Elevated LFTs 04/24/2015  . Hyponatremia 04/24/2015  . Influenza A 04/24/2015  . Oral candidiasis 04/24/2015  . Pulmonary nodule 08/26/2014  . Iritis 04/30/2013  . Steroid responder, bilateral 04/30/2013  . Hemoptysis 11/02/2012  . Constipation 05/08/2012  . Complete rupture of rotator cuff 09/19/2011  . Boutonniere deformity  11/22/2010  . Effusion of wrist 11/22/2010  . Hand joint pain 11/22/2010  . Polymenorrhea 10/28/2010  . Anemia 10/28/2010  . External otitis 10/27/2010  . Endometrial polyp 10/27/2010  . Menorrhagia 10/27/2010  . Right flank pain 10/12/2010  . Fatigue 06/23/2010  .  Skin rash 06/07/2010  . Edema 06/07/2010  . Tobacco abuse 06/07/2010  . Tobacco dependence syndrome 06/07/2010  . Primary localized osteoarthrosis, lower leg 05/11/2010  . Chronic myeloid leukemia ([place]) 03/24/2010  . ASTHMA 03/24/2010  . GERD 03/24/2010  . SYSTEMIC LUPUS ERYTHEMATOSUS 03/24/2010  . Rheumatoid arthritis ([place]) 03/24/2010  . FLATULENCE ERUCTATION AND GAS PAIN 03/24/2010  . Asthma 03/24/2010    1. Chronic bronchitis with COPD (chronic obstructive pulmonary disease) (HCC) Stable, not currently on inhalers or oxygen.  Continue to monitor.   2. Rheumatoid arthritis involving multiple sites with positive rheumatoid factor ([place]) Continue to see [facility] for follow up  3. BMI 40.0-44.9, adult (HCC) Obesity Counseling: Risk Assessment: An assessment of behavioral risk factors was made today and includes lack of exercise sedentary lifestyle, lack of portion control and poor dietary habits.  Risk Modification Advice: She was counseled on portion control guidelines. Restricting daily caloric intake to 1800. The detrimental long term effects of obesity on her health and ongoing poor compliance was also discussed with the patient.  4. Morbid obesity (HCC) BMI over 40, commodities include RA, copd, and HTN.  5. Cavitary pneumonia Repeat Chest Ct, it has been 6 years since last imaging. - CT Chest W Contrast; Future  6. Shortness of breath - CT Chest W Contrast; Future  General Counseling: I have discussed the findings of the evaluation and examination with Laura Chen.  I have also discussed any further diagnostic evaluation thatmay be needed or ordered today. Lasean verbalizes understanding of the findings of todays  visit. We also reviewed her medications today and discussed drug interactions and side effects including but not limited excessive drowsiness and altered mental states. We also discussed that there is always a risk not just to her but also people around her. she has been encouraged to call the office with any questions or concerns that should arise related to todays visit.  No orders of the defined types were placed in this encounter.    Time spent: 30 includes 10 minutes of chart review.  This patient was seen by Orson Gear AGNP-C in Collaboration with Dr. Devona Konig as a part of collaborative care agreement.   I have personally obtained a history, examined the patient, evaluated laboratory and imaging results, formulated the assessment and plan and placed orders.    Allyne Gee, MD [facility] Pulmonary and Critical Care Sleep medicine

## 2019-06-05 ENCOUNTER — Other Ambulatory Visit: Payer: Self-pay

## 2019-06-05 DIAGNOSIS — M0579 Rheumatoid arthritis with rheumatoid factor of multiple sites without organ or systems involvement: Secondary | ICD-10-CM

## 2019-06-05 DIAGNOSIS — F321 Major depressive disorder, single episode, moderate: Secondary | ICD-10-CM

## 2019-06-05 MED ORDER — BUPROPION HCL ER (SR) 100 MG PO TB12: 100.0000 mg | ORAL_TABLET | Freq: Every day | ORAL | 3 refills | Status: DC

## 2019-06-06 ENCOUNTER — Other Ambulatory Visit: Payer: Self-pay | Admitting: Nurse Practitioner

## 2019-06-06 DIAGNOSIS — M0579 Rheumatoid arthritis with rheumatoid factor of multiple sites without organ or systems involvement: Secondary | ICD-10-CM

## 2019-06-06 MED ORDER — TRAMADOL HCL 50 MG PO TABS: 100.0000 mg | ORAL_TABLET | Freq: Two times a day (BID) | ORAL | 1 refills | Status: DC | PRN

## 2019-06-06 NOTE — Progress Notes (Signed)
Renewed tramadol prescription and sent to [facility].

## 2019-06-07 IMAGING — CR DG CHEST 2V
1 series · 2 of 2 positions shown · non-contrast
Comparison: Chest x-ray dated April 13, 2017.

CLINICAL DATA: Cough and congestion with body aches. History of
leukemia.

EXAM:
CHEST - 2 VIEW

[Series 1: dg chest 2 view · 0.14mm/px · 2 of 2 slices shown]
[im 1/2]
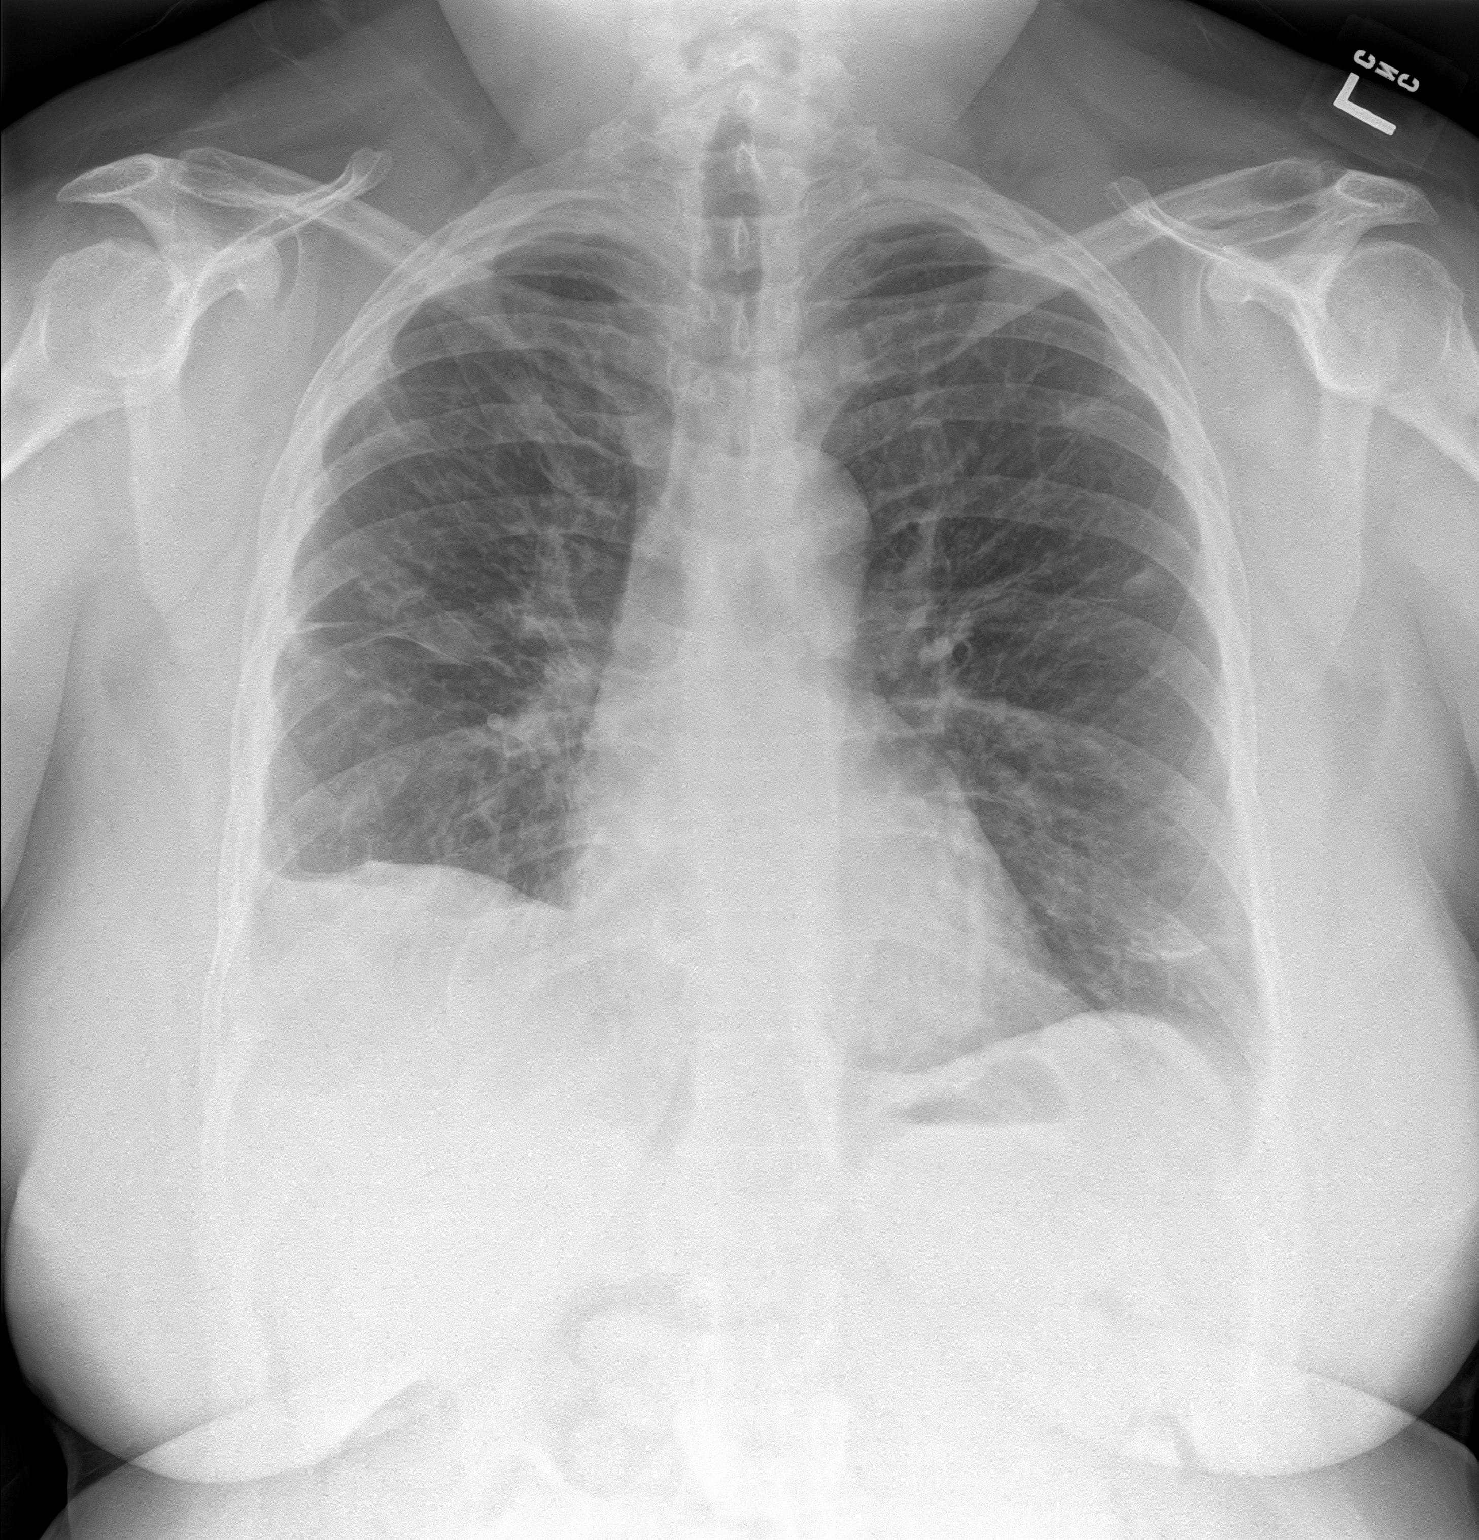
[im 2/2]
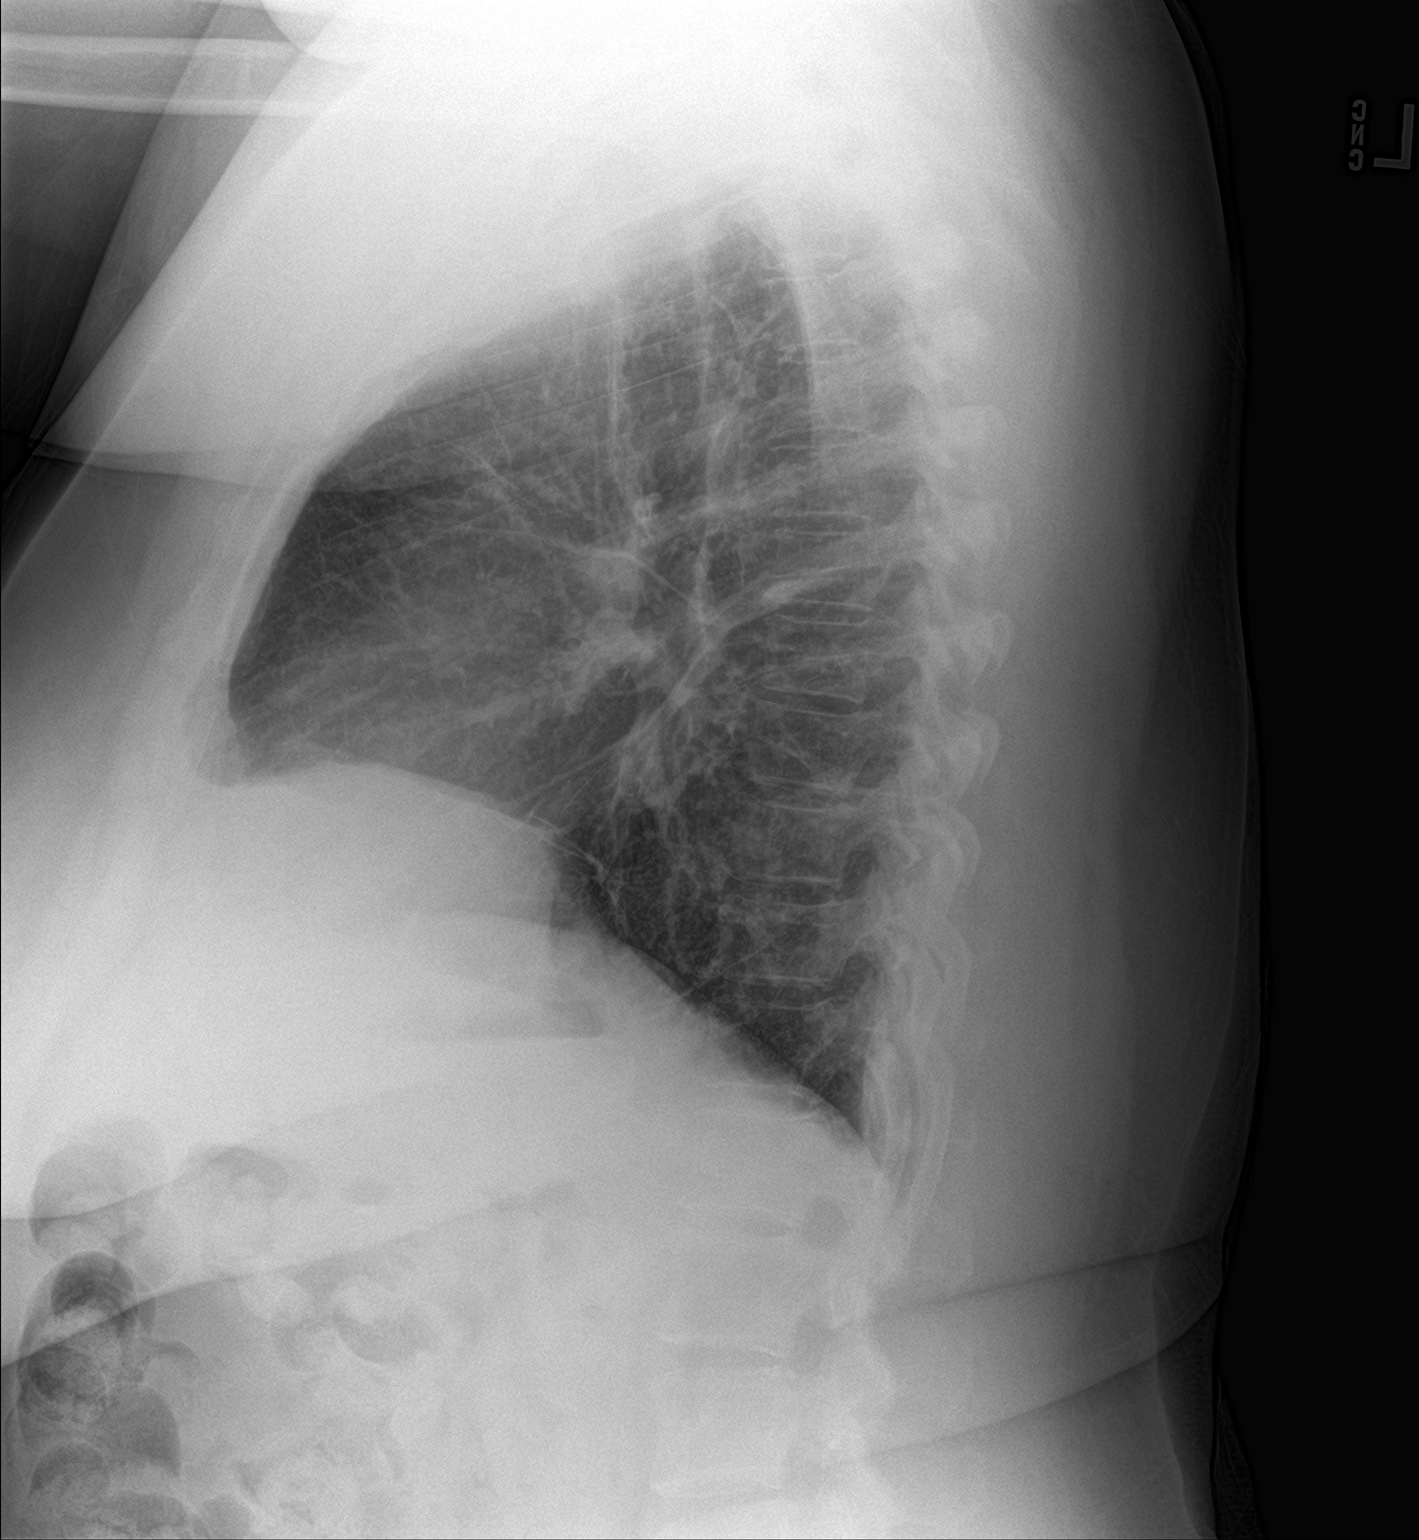

[2 of 2 positions shown; findings below may reference images not displayed]

FINDINGS: The heart size and mediastinal contours are within normal limits.
Normal pulmonary vascularity. Unchanged eventration of the anterior
right hemidiaphragm with blunting of the right costophrenic angle.
Bronchitic changes appear slightly worsened when compared to prior
study. Stable scarring/atelectasis at the left lung base. No focal
consolidation, pleural effusion, or pneumothorax. No acute osseous
abnormality.
IMPRESSION: Slightly worsened bronchitic changes.  No consolidation.

## 2019-06-09 ENCOUNTER — Encounter: Payer: Self-pay | Admitting: Nurse Practitioner

## 2019-06-09 ENCOUNTER — Ambulatory Visit (INDEPENDENT_AMBULATORY_CARE_PROVIDER_SITE_OTHER): Payer: Medicare Other | Admitting: Nurse Practitioner

## 2019-06-09 ENCOUNTER — Other Ambulatory Visit: Payer: Self-pay

## 2019-06-09 VITALS — BP 153/78 | HR 85 | Temp 97.3°F | Resp 16 | Ht 64.0 in | Wt 251.8 lb

## 2019-06-09 DIAGNOSIS — R300 Dysuria: Secondary | ICD-10-CM | POA: Diagnosis not present

## 2019-06-09 DIAGNOSIS — E1165 Type 2 diabetes mellitus with hyperglycemia: Secondary | ICD-10-CM | POA: Diagnosis not present

## 2019-06-09 DIAGNOSIS — N390 Urinary tract infection, site not specified: Secondary | ICD-10-CM

## 2019-06-09 DIAGNOSIS — E039 Hypothyroidism, unspecified: Secondary | ICD-10-CM

## 2019-06-09 DIAGNOSIS — Z6841 Body Mass Index (BMI) 40.0 and over, adult: Secondary | ICD-10-CM

## 2019-06-09 DIAGNOSIS — R319 Hematuria, unspecified: Secondary | ICD-10-CM | POA: Diagnosis not present

## 2019-06-09 DIAGNOSIS — B370 Candidal stomatitis: Secondary | ICD-10-CM | POA: Diagnosis not present

## 2019-06-09 LAB — POCT URINALYSIS DIPSTICK
Bilirubin, UA: NEGATIVE
Glucose, UA: NEGATIVE
Ketones, UA: NEGATIVE
Nitrite, UA: NEGATIVE
Protein, UA: POSITIVE — AB
Spec Grav, UA: 1.02 (ref 1.010–1.025)
Urobilinogen, UA: 0.2 E.U./dL
pH, UA: 5 (ref 5.0–8.0)

## 2019-06-09 LAB — POCT GLYCOSYLATED HEMOGLOBIN (HGB A1C): Hemoglobin A1C: 6.4 % — AB (ref 4.0–5.6)

## 2019-06-09 MED ORDER — OZEMPIC (0.25 OR 0.5 MG/DOSE) 2 MG/1.5ML ~~LOC~~ SOPN: 0.5000 mg | PEN_INJECTOR | SUBCUTANEOUS | 3 refills | Status: DC

## 2019-06-09 MED ORDER — CIPROFLOXACIN HCL 500 MG PO TABS: 500.0000 mg | ORAL_TABLET | Freq: Two times a day (BID) | ORAL | 0 refills | Status: DC

## 2019-06-09 MED ORDER — FLUCONAZOLE 150 MG PO TABS: ORAL_TABLET | ORAL | 2 refills | Status: DC

## 2019-06-09 MED ORDER — OZEMPIC 0.25 MG OR 0.5 MG (2 MG/1.5 ML) SUBCUTANEOUS PEN INJECTOR
SUBCUTANEOUS | 0 days
Start: 2019-06-09 — End: ?

## 2019-06-09 MED ORDER — CIPROFLOXACIN 500 MG TABLET
ORAL | 0 days
Start: 2019-06-09 — End: ?

## 2019-06-09 NOTE — Progress Notes (Signed)
[facility] [place], [place] [address]  Internal MEDICINE  Office Visit Note  Patient Name: Laura Chen  [id number]  [id number]  Date of Service: 06/09/2019  Chief Complaint  Patient presents with  . Diabetes  . Hypothyroidism  . Hypertension  . Gastroesophageal Reflux  . Arthritis  . Dysuria    burning upon urination, pain     The patient is here for routine follow up. Today, she is having burning with urination. Has been going on for about a week. Gradually getting worse. She does have appointment with GYN provider on Wednesday. Also feels as though she has bacterial vaginosis and possibly yeast infection as well. She is having vaginal bleeding as well. She is post menopausal. She states that she has aching type pain just inside the vaginal area making it uncomfortable for her to sit.  She does have diet controlled type 2 diabetes. Today, her HgbA1c is 6.4. she had discussion with her oncologist about possibly starting ozempic. This will help to better control her blood sugars and possibly help her to lose weight. Losing weight will help her joints to feel better and she would be able to move around with greater ease.        Current Medication: Outpatient Encounter Medications as of 06/09/2019  Medication Sig Note  . Accu-Chek FastClix Lancets MISC USE TO TEST BLOOD SUGAR QD UTD DX E11.65   . aspirin EC 81 MG tablet Take 81 mg by mouth every evening.    . baclofen (LIORESAL) 10 MG tablet Take 10 mg by mouth 3 (three) times daily.   Marland Kitchen buPROPion (WELLBUTRIN SR) 100 MG 12 hr tablet Take 1 tablet (100 mg total) by mouth daily.   . cetirizine (ZYRTEC) 10 MG tablet Take 10 mg by mouth.   . cholecalciferol (VITAMIN D) 1000 units tablet Take 2,000 Units by mouth daily.   . DULoxetine (CYMBALTA) 20 MG capsule Take 1 capsule (20 mg total) by mouth daily.   . Evolocumab (REPATHA) 140 MG/ML SOSY Inject 140 mg into the skin every 14 (fourteen) days.   .  fluconazole (DIFLUCAN) 150 MG tablet Take one tablet every other day for 5 doses   . fluticasone (FLONASE) 50 MCG/ACT nasal spray USE 1 SPRAY IN BOTH  NOSTRILS DAILY.   Marland Kitchen glucose blood (ACCU-CHEK GUIDE) test strip USE TO CHECK BLOOD GLUCOSE  EVERY DAY AS DIRECTED   . levothyroxine (SYNTHROID) 50 MCG tablet Take 1 tablet (50 mcg total) by mouth daily.   Marland Kitchen linaclotide (LINZESS) 290 MCG CAPS capsule Take 1 capsule (290 mcg total) by mouth daily.   . norethindrone (NORLYDA) 0.35 MG tablet Take 1 tablet by mouth daily.   Marland Kitchen omeprazole (PRILOSEC) 20 MG capsule Take 1 capsule (20 mg total) by mouth daily.   . Oxycodone HCl 10 MG TABS Take 10 mg by mouth 3 (three) times daily as needed (for pain).   . ponatinib HCl (ICLUSIG) 15 MG tablet Take 15 mg by mouth daily.  12/09/2018: Reports taking daily  . predniSONE (DELTASONE) 5 MG tablet Take 5 mg by mouth daily with breakfast.   . predniSONE (STERAPRED UNI-PAK 21 TAB) 10 MG (21) TBPK tablet 1Use as directed for 6 days for sciatica pain and hold prednisone 5 mg when she is on taper   . rosuvastatin (CRESTOR) 5 MG tablet TAKE 1 TABLET BY MOUTH  DAILY WITH SUPPER   . spironolactone (ALDACTONE) 50 MG tablet daily.   . traMADol (ULTRAM) 50 MG  tablet Take 2 tablets (100 mg total) by mouth every 12 (twelve) hours as needed.   . [DISCONTINUED] fluconazole (DIFLUCAN) 150 MG tablet Take one tablet every other day for 5 doses   . ciprofloxacin (CIPRO) 500 MG tablet Take 1 tablet (500 mg total) by mouth 2 (two) times daily.   . Semaglutide,0.25 or 0.5MG /DOS, (OZEMPIC, 0.25 OR 0.5 MG/DOSE,) 2 MG/1.5ML SOPN Inject 0.5 mg into the skin once a week. Take 0.25mg  [place] once weekly for three weeks, then increase to 0.5mg   weekly.    No facility-administered encounter medications on file as of 06/09/2019.    Surgical History: Past Surgical History:  Procedure Laterality Date  . DEXA  2010   osteopenia  . MYOMECTOMY  2008  . right ankle AFO  2011  . TRANSTHORACIC  ECHOCARDIOGRAM  123456   nl systolic/diastolic fxn, EF XX123456, mild MR, normal PA pressures  . US/HSG  05/2010   possible endometrial polyp, rec rpt 8 wks continue loestrin 24 [facility])    Medical History: Past Medical History:  Diagnosis Date  . Asthma   . CML (chronic myeloid leukemia) ([place]) 2011   (Dr. Alyse Low) stopped gleevec 2/2 side effects  . Diabetes mellitus without complication ([place])   . GERD (gastroesophageal reflux disease)   . History of shingles   . History of syphilis 1990   s/p treatment  . Hypertension   . Leukemia ([place])   . Osteopenia    due to prednisone use, was on reclast  . PUD (peptic ulcer disease)   . Rheumatoid arthritis(714.0) 1990s   Jacoud's arthropathy, previously treated with MTX, TNFa (remicade, enbrel, humira, orencia, rituximab, and actemra)  . SLE (systemic lupus erythematosus) ([place]) 09/2009   Rheum-Dr. Joan Mayans  . Thyroid goiter    Endo-Dr. Eddie Dibbles    Family History: Family History  Problem Relation Age of Onset  . Heart attack Maternal Grandmother   . Coronary artery disease Maternal Grandmother   . Hypertension Mother   . Hypertension Other        Aunt    Social History   Socioeconomic History  . Marital status: Single    Spouse name: Not on file  . Number of children: Not on file  . Years of education: Not on file  . Highest education level: Not on file  Occupational History  . Occupation: Secretary/administrator at [employer]  . Smoking status: Former Smoker    Packs/day: 0.30    Types: Cigarettes  . Smokeless tobacco: Never Used  . Tobacco comment: 6 cigarettes/day  Substance and Sexual Activity  . Alcohol use: No  . Drug use: No  . Sexual activity: Not on file  Other Topics Concern  . Not on file  Social History Narrative   Caffeine: 1 cup/day coffee      Lives with mother, 1 dog      HS education   Social Determinants of Health   Financial Resource Strain: Low Risk   . Difficulty of Paying Living  Expenses: Not hard at all  Food Insecurity: No Food Insecurity  . Worried About Charity fundraiser in the Last Year: Never true  . Ran Out of Food in the Last Year: Never true  Transportation Needs: No Transportation Needs  . Lack of Transportation (Medical): No  . Lack of Transportation (Non-Medical): No  Physical Activity: Insufficiently Active  . Days of Exercise per Week: 3 days  . Minutes of Exercise per Session: 20 min  Stress:   .  Feeling of Stress :   Social Connections:   . Frequency of Communication with Friends and Family:   . Frequency of Social Gatherings with Friends and Family:   . Attends Religious Services:   . Active Member of Clubs or Organizations:   . Attends Archivist Meetings:   Marland Kitchen Marital Status:   Intimate Partner Violence: Not At Risk  . Fear of Current or Ex-Partner: No  . Emotionally Abused: No  . Physically Abused: No  . Sexually Abused: No      Review of Systems  Constitutional: Negative for activity change, chills, fatigue and unexpected weight change.  HENT: Negative for congestion, postnasal drip, rhinorrhea, sneezing and sore throat.   Respiratory: Negative for cough, chest tightness, shortness of breath and wheezing.   Cardiovascular: Negative for chest pain and palpitations.  Gastrointestinal: Negative for abdominal pain, constipation, diarrhea, nausea and vomiting.  Endocrine: Negative for cold intolerance, heat intolerance, polydipsia and polyuria.       Blood sugars doing well  Controlled without medication.  Genitourinary: Positive for dysuria, flank pain, frequency, hematuria and pelvic pain.  Musculoskeletal: Positive for arthralgias, gait problem and joint swelling. Negative for back pain and neck pain.  Skin: Negative for rash.  Allergic/Immunologic: Negative for environmental allergies.  Neurological: Negative for dizziness, tremors, numbness and headaches.  Hematological: Negative for adenopathy. Does not bruise/bleed  easily.  Psychiatric/Behavioral: Negative for behavioral problems (Depression), sleep disturbance and suicidal ideas. The patient is not nervous/anxious.     Today's Vitals   06/09/19 1419  BP: (!) 153/78  Pulse: 85  Resp: 16  Temp: (!) 97.3 F (36.3 C)  SpO2: 98%  Weight: 251 lb 12.8 oz (114.2 kg)  Height: 5\' 4"  (1.626 m)   Body mass index is 43.22 kg/m.  Physical Exam Vitals and nursing note reviewed.  Constitutional:      General: She is not in acute distress.    Appearance: Normal appearance. She is well-developed. She is obese. She is not diaphoretic.  HENT:     Head: Normocephalic and atraumatic.     Mouth/Throat:     Pharynx: No oropharyngeal exudate.  Eyes:     Pupils: Pupils are equal, round, and reactive to light.  Neck:     Thyroid: No thyromegaly.     Vascular: No JVD.     Trachea: No tracheal deviation.  Cardiovascular:     Rate and Rhythm: Normal rate and regular rhythm.     Heart sounds: Normal heart sounds. No murmur. No friction rub. No gallop.   Pulmonary:     Effort: Pulmonary effort is normal. No respiratory distress.     Breath sounds: Normal breath sounds. No wheezing or rales.  Chest:     Chest wall: No tenderness.  Abdominal:     General: Bowel sounds are normal.     Palpations: Abdomen is soft.     Tenderness: There is abdominal tenderness.  Genitourinary:    Comments: Urine sample is positive for blood, protein, and moderate WBC Musculoskeletal:     Cervical back: Normal range of motion and neck supple.     Comments: Moderate lower back pain which radiates into the hips and legs. Has to use walker to help with ambulation.   Lymphadenopathy:     Cervical: No cervical adenopathy.  Skin:    General: Skin is warm and dry.  Neurological:     Mental Status: She is alert and oriented to person, place, and time. Mental status is at  baseline.     Cranial Nerves: No cranial nerve deficit.  Psychiatric:        Behavior: Behavior normal.         Thought Content: Thought content normal.        Judgment: Judgment normal.    Assessment/Plan: 1. Type 2 diabetes mellitus with hyperglycemia, without long-term current use of insulin (HCC) - POCT HgB A1C 6.4 today. Will add ozempic 0.25mg  weekly. Discussed instructions for use and sample pen provided. New prescription sent to her pharmacy today.  - Semaglutide,0.25 or 0.5MG /DOS, (OZEMPIC, 0.25 OR 0.5 MG/DOSE,) 2 MG/1.5ML SOPN; Inject 0.5 mg into the skin once a week. Take 0.25mg  [place] once weekly for three weeks, then increase to 0.5mg  [place] weekly.  Dispense: 1 pen; Refill: 3  2. Urinary tract infection with hematuria, site unspecified Start cipro 500mg  twice daily for 10 days. Send urine sample for culture and sensitivity and adjust antibiotics as indicated.  - CULTURE, URINE COMPREHENSIVE - ciprofloxacin (CIPRO) 500 MG tablet; Take 1 tablet (500 mg total) by mouth 2 (two) times daily.  Dispense: 20 tablet; Refill: 0  3. Oral candidiasis Diflucan 150mg  should be taken every other day for 5 doses.  - fluconazole (DIFLUCAN) 150 MG tablet; Take one tablet every other day for 5 doses  Dispense: 5 tablet; Refill: 2  4. Acquired hypothyroidism Stable. Continue levothyroxine as prescribed   5. Dysuria Treat as infection. Adjust as indicated. - POCT Urinalysis Dipstick  6. BMI 40.0-44.9, adult ([place]) Start ozempic 0.25mg  weekly to help with weight management and control blood sugars.   General Counseling: Brita verbalizes understanding of the findings of todays visit and agrees with plan of treatment. I have discussed any further diagnostic evaluation that may be needed or ordered today. We also reviewed her medications today. she has been encouraged to call the office with any questions or concerns that should arise related to todays visit.  Diabetes Counseling:  1. Addition of ACE inh/ ARB'S for nephroprotection. Microalbumin is updated  2. Diabetic foot care, prevention of complications.  Podiatry consult 3. Exercise and lose weight.  4. Diabetic eye examination, Diabetic eye exam is updated  5. Monitor blood sugar closlely. nutrition counseling.  6. Sign and symptoms of hypoglycemia including shaking sweating,confusion and headaches.  This patient was seen by Leretha Pol FNP Collaboration with Dr Lavera Guise as a part of collaborative care agreement  Orders Placed This Encounter  Procedures  . CULTURE, URINE COMPREHENSIVE  . POCT HgB A1C  . POCT Urinalysis Dipstick    Meds ordered this encounter  Medications  . Semaglutide,0.25 or 0.5MG /DOS, (OZEMPIC, 0.25 OR 0.5 MG/DOSE,) 2 MG/1.5ML SOPN    Sig: Inject 0.5 mg into the skin once a week. Take 0.25mg  [place] once weekly for three weeks, then increase to 0.5mg  [place] weekly.    Dispense:  1 pen    Refill:  3    Order Specific Question:   Supervising Provider    Answer:   Lavera Guise [id number]  . ciprofloxacin (CIPRO) 500 MG tablet    Sig: Take 1 tablet (500 mg total) by mouth 2 (two) times daily.    Dispense:  20 tablet    Refill:  0    Order Specific Question:   Supervising Provider    Answer:   Lavera Guise [id number]  . fluconazole (DIFLUCAN) 150 MG tablet    Sig: Take one tablet every other day for 5 doses    Dispense:  5 tablet  Refill:  2    Order Specific Question:   Supervising Provider    Answer:   Lavera Guise [id number]    Total time spent: 30 Minutes   Time spent includes review of chart, medications, test results, and follow up plan with the patient.      Dr Lavera Guise Internal medicine

## 2019-06-10 ENCOUNTER — Ambulatory Visit
Admission: RE | Admit: 2019-06-10 | Discharge: 2019-06-10 | Disposition: A | Discharge status: Home / Self Care | Payer: Medicare Other | Source: Ambulatory Visit | Attending: Adult Health | Admitting: Adult Health

## 2019-06-10 ENCOUNTER — Other Ambulatory Visit: Payer: Self-pay | Admitting: *Deleted

## 2019-06-10 DIAGNOSIS — J189 Pneumonia, unspecified organism: Secondary | ICD-10-CM | POA: Insufficient documentation

## 2019-06-10 DIAGNOSIS — R0602 Shortness of breath: Secondary | ICD-10-CM | POA: Insufficient documentation

## 2019-06-10 DIAGNOSIS — J984 Other disorders of lung: Secondary | ICD-10-CM | POA: Insufficient documentation

## 2019-06-10 DIAGNOSIS — M170 Bilateral primary osteoarthritis of knee: Principal | ICD-10-CM

## 2019-06-10 MED ORDER — IOHEXOL 300 MG/ML  SOLN
75.0000 mL | Freq: Once | INTRAMUSCULAR | Status: AC | PRN
  Administered 2019-06-10: 75 mL via INTRAVENOUS

## 2019-06-10 MED ORDER — OXYCODONE 10 MG TABLET
ORAL_TABLET | Freq: Three times a day (TID) | ORAL | 0 refills | 30 days | Status: CP | PRN
Start: 2019-06-10 — End: ?

## 2019-06-10 NOTE — Patient Outreach (Signed)
[name]) Care Management  06/10/2019  Laura Chen 06-05-1965 [id number]   RN Health Coach Quarterly Outreach  Referral Date:08/09/2018 Referral Source:[employer] High Risk List Reason for Referral:Assess Needs Insurance:[employer] Medicare   Outreach Attempt:  Outreach attempt #4 to patient for follow up. No answer. RN Health Coach left HIPAA compliant voicemail message along with contact information.  Plan:  RN Health Coach will make another outreach attempt within the month of May if no return call back from patient.  [place] [phone] [email or web address]

## 2019-06-11 ENCOUNTER — Encounter
Admit: 2019-06-11 | Discharge: 2019-06-12 | Payer: MEDICARE | Attending: Obstetrics & Gynecology | Primary: Obstetrics & Gynecology

## 2019-06-11 DIAGNOSIS — R102 Pelvic and perineal pain: Principal | ICD-10-CM

## 2019-06-11 DIAGNOSIS — N939 Abnormal uterine and vaginal bleeding, unspecified: Principal | ICD-10-CM

## 2019-06-11 DIAGNOSIS — N898 Other specified noninflammatory disorders of vagina: Principal | ICD-10-CM

## 2019-06-12 ENCOUNTER — Encounter: Payer: Self-pay | Admitting: Nurse Practitioner

## 2019-06-12 LAB — CULTURE, URINE COMPREHENSIVE

## 2019-06-12 NOTE — Progress Notes (Signed)
Started patient on cipro at time of visit

## 2019-06-13 MED ORDER — CLINDAMYCIN 2 % VAGINAL CREAM
Freq: Every evening | VAGINAL | 1 refills | 8.00000 days | Status: CP
Start: 2019-06-13 — End: 2019-06-27

## 2019-06-16 ENCOUNTER — Other Ambulatory Visit: Payer: Self-pay

## 2019-06-16 ENCOUNTER — Encounter: Admit: 2019-06-16 | Discharge: 2019-06-17 | Payer: MEDICARE | Attending: Internal Medicine | Primary: Internal Medicine

## 2019-06-16 DIAGNOSIS — Z79899 Other long term (current) drug therapy: Secondary | ICD-10-CM | POA: Diagnosis not present

## 2019-06-16 DIAGNOSIS — E039 Hypothyroidism, unspecified: Secondary | ICD-10-CM

## 2019-06-16 DIAGNOSIS — M059 Rheumatoid arthritis with rheumatoid factor, unspecified: Secondary | ICD-10-CM | POA: Diagnosis not present

## 2019-06-16 MED ORDER — LEVOTHYROXINE SODIUM 50 MCG PO TABS: 50.0000 ug | ORAL_TABLET | Freq: Every day | ORAL | 1 refills | Status: AC

## 2019-06-20 ENCOUNTER — Ambulatory Visit: Admit: 2019-06-20 | Discharge: 2019-06-21 | Payer: MEDICARE

## 2019-06-20 ENCOUNTER — Encounter: Admit: 2019-06-20 | Discharge: 2019-06-21 | Payer: MEDICARE

## 2019-06-20 DIAGNOSIS — M059 Rheumatoid arthritis with rheumatoid factor, unspecified: Secondary | ICD-10-CM | POA: Diagnosis not present

## 2019-06-20 DIAGNOSIS — M069 Rheumatoid arthritis, unspecified: Secondary | ICD-10-CM | POA: Diagnosis not present

## 2019-06-20 DIAGNOSIS — M25552 Pain in left hip: Secondary | ICD-10-CM | POA: Diagnosis not present

## 2019-06-20 DIAGNOSIS — M25551 Pain in right hip: Secondary | ICD-10-CM | POA: Diagnosis not present

## 2019-06-26 ENCOUNTER — Encounter: Admit: 2019-06-26 | Discharge: 2019-06-27 | Payer: MEDICARE

## 2019-06-26 DIAGNOSIS — K859 Acute pancreatitis without necrosis or infection, unspecified: Secondary | ICD-10-CM | POA: Diagnosis not present

## 2019-06-26 DIAGNOSIS — C9210 Chronic myeloid leukemia, BCR/ABL-positive, not having achieved remission: Secondary | ICD-10-CM | POA: Diagnosis not present

## 2019-06-26 DIAGNOSIS — M059 Rheumatoid arthritis with rheumatoid factor, unspecified: Secondary | ICD-10-CM | POA: Diagnosis not present

## 2019-06-30 ENCOUNTER — Encounter: Payer: Self-pay | Admitting: Adult Health

## 2019-06-30 ENCOUNTER — Ambulatory Visit (INDEPENDENT_AMBULATORY_CARE_PROVIDER_SITE_OTHER): Payer: Medicare Other | Admitting: Adult Health

## 2019-06-30 VITALS — Ht 64.0 in | Wt 248.0 lb

## 2019-06-30 DIAGNOSIS — R195 Other fecal abnormalities: Secondary | ICD-10-CM

## 2019-06-30 DIAGNOSIS — E1165 Type 2 diabetes mellitus with hyperglycemia: Secondary | ICD-10-CM | POA: Diagnosis not present

## 2019-06-30 NOTE — Progress Notes (Signed)
[facility] [place], [place] [address]  Internal MEDICINE  Telephone Visit  Patient Name: Laura Chen  [id number]  [id number]  Date of Service: 06/30/2019  I connected with the patient at [phone] by telephone and verified the patients identity using two identifiers.   I discussed the limitations, risks, security and privacy concerns of performing an evaluation and management service by telephone and the availability of in person appointments. I also discussed with the patient that there may be a patient responsible charge related to the service.  The patient expressed understanding and agrees to proceed.    Chief Complaint  Patient presents with  . Telephone Assessment  . Telephone Screen  . Follow-up    review fecal lab results    HPI  Pt is here for follow up on Fecal lab results. Her results were positive for blood.  She reports that she had been dealing with some vaginal bleeding, and she believes her specimen may have been contaminated by that.  Also, she has internal hemorrhoids in the past and that could be the cause.  She had a Gi doctor at [facility], and would like to see them for follow up.    Current Medication: Outpatient Encounter Medications as of 06/30/2019  Medication Sig Note  . Accu-Chek FastClix Lancets MISC USE TO TEST BLOOD SUGAR QD UTD DX E11.65   . aspirin EC 81 MG tablet Take 81 mg by mouth every evening.    . baclofen (LIORESAL) 10 MG tablet Take 10 mg by mouth 3 (three) times daily.   Marland Kitchen buPROPion (WELLBUTRIN SR) 100 MG 12 hr tablet Take 1 tablet (100 mg total) by mouth daily.   . cetirizine (ZYRTEC) 10 MG tablet Take 10 mg by mouth.   . cholecalciferol (VITAMIN D) 1000 units tablet Take 2,000 Units by mouth daily.   . DULoxetine (CYMBALTA) 20 MG capsule Take 1 capsule (20 mg total) by mouth daily.   . Evolocumab (REPATHA) 140 MG/ML SOSY Inject 140 mg into the skin every 14 (fourteen) days.   . fluconazole (DIFLUCAN) 150 MG tablet Take  one tablet every other day for 5 doses   . fluticasone (FLONASE) 50 MCG/ACT nasal spray USE 1 SPRAY IN BOTH  NOSTRILS DAILY.   Marland Kitchen glucose blood (ACCU-CHEK GUIDE) test strip USE TO CHECK BLOOD GLUCOSE  EVERY DAY AS DIRECTED   . levothyroxine (SYNTHROID) 50 MCG tablet Take 1 tablet (50 mcg total) by mouth daily.   Marland Kitchen linaclotide (LINZESS) 290 MCG CAPS capsule Take 1 capsule (290 mcg total) by mouth daily.   . norethindrone (NORLYDA) 0.35 MG tablet Take 1 tablet by mouth daily.   Marland Kitchen omeprazole (PRILOSEC) 20 MG capsule Take 1 capsule (20 mg total) by mouth daily.   . Oxycodone HCl 10 MG TABS Take 10 mg by mouth 3 (three) times daily as needed (for pain).   . ponatinib HCl (ICLUSIG) 15 MG tablet Take 15 mg by mouth daily.  12/09/2018: Reports taking daily  . predniSONE (DELTASONE) 5 MG tablet Take 5 mg by mouth daily with breakfast.   . predniSONE (STERAPRED UNI-PAK 21 TAB) 10 MG (21) TBPK tablet 1Use as directed for 6 days for sciatica pain and hold prednisone 5 mg when she is on taper   . rosuvastatin (CRESTOR) 5 MG tablet TAKE 1 TABLET BY MOUTH  DAILY WITH SUPPER   . Semaglutide,0.25 or 0.5MG /DOS, (OZEMPIC, 0.25 OR 0.5 MG/DOSE,) 2 MG/1.5ML SOPN Inject 0.5 mg into the skin once a week.  Take 0.25mg  [place] once weekly for three weeks, then increase to 0.5mg   weekly.   Marland Kitchen spironolactone (ALDACTONE) 50 MG tablet daily.   . traMADol (ULTRAM) 50 MG tablet Take 2 tablets (100 mg total) by mouth every 12 (twelve) hours as needed.   . ciprofloxacin (CIPRO) 500 MG tablet Take 1 tablet (500 mg total) by mouth 2 (two) times daily. (Patient not taking: Reported on 06/30/2019)    No facility-administered encounter medications on file as of 06/30/2019.    Surgical History: Past Surgical History:  Procedure Laterality Date  . DEXA  2010   osteopenia  . MYOMECTOMY  2008  . right ankle AFO  2011  . TRANSTHORACIC ECHOCARDIOGRAM  123456   nl systolic/diastolic fxn, EF XX123456, mild MR, normal PA pressures  . US/HSG   05/2010   possible endometrial polyp, rec rpt 8 wks continue loestrin 24 [facility])    Medical History: Past Medical History:  Diagnosis Date  . Asthma   . CML (chronic myeloid leukemia) ([place]) 2011   (Dr. Alyse Low) stopped gleevec 2/2 side effects  . Diabetes mellitus without complication ([place])   . GERD (gastroesophageal reflux disease)   . History of shingles   . History of syphilis 1990   s/p treatment  . Hypertension   . Osteopenia    due to prednisone use, was on reclast  . PUD (peptic ulcer disease)   . Rheumatoid arthritis(714.0) 1990s   Jacoud's arthropathy, previously treated with MTX, TNFa (remicade, enbrel, humira, orencia, rituximab, and actemra)  . SLE (systemic lupus erythematosus) ([place]) 09/2009   Rheum-Dr. Joan Mayans  . Thyroid goiter    Endo-Dr. Eddie Dibbles    Family History: Family History  Problem Relation Age of Onset  . Heart attack Maternal Grandmother   . Coronary artery disease Maternal Grandmother   . Hypertension Mother   . Hypertension Other        Aunt    Social History   Socioeconomic History  . Marital status: Single    Spouse name: Not on file  . Number of children: Not on file  . Years of education: Not on file  . Highest education level: Not on file  Occupational History  . Occupation: Secretary/administrator at [employer]  . Smoking status: Former Smoker    Packs/day: 0.30    Types: Cigarettes  . Smokeless tobacco: Never Used  . Tobacco comment: 6 cigarettes/day  Substance and Sexual Activity  . Alcohol use: No  . Drug use: No  . Sexual activity: Not on file  Other Topics Concern  . Not on file  Social History Narrative   Caffeine: 1 cup/day coffee      Lives with mother, 1 dog      HS education   Social Determinants of Health   Financial Resource Strain: Low Risk   . Difficulty of Paying Living Expenses: Not hard at all  Food Insecurity: No Food Insecurity  . Worried About Charity fundraiser in the Last Year:  Never true  . Ran Out of Food in the Last Year: Never true  Transportation Needs: No Transportation Needs  . Lack of Transportation (Medical): No  . Lack of Transportation (Non-Medical): No  Physical Activity: Insufficiently Active  . Days of Exercise per Week: 3 days  . Minutes of Exercise per Session: 20 min  Stress:   . Feeling of Stress :   Social Connections:   . Frequency of Communication with Friends and Family:   . Frequency  of Social Gatherings with Friends and Family:   . Attends Religious Services:   . Active Member of Clubs or Organizations:   . Attends Archivist Meetings:   Marland Kitchen Marital Status:   Intimate Partner Violence: Not At Risk  . Fear of Current or Ex-Partner: No  . Emotionally Abused: No  . Physically Abused: No  . Sexually Abused: No      Review of Systems  Constitutional: Negative for chills, fatigue and unexpected weight change.  HENT: Negative for congestion, rhinorrhea, sneezing and sore throat.   Eyes: Negative for photophobia, pain and redness.  Respiratory: Negative for cough, chest tightness and shortness of breath.   Cardiovascular: Negative for chest pain and palpitations.  Gastrointestinal: Negative for abdominal pain, constipation, diarrhea, nausea and vomiting.  Endocrine: Negative.   Genitourinary: Negative for dysuria and frequency.  Musculoskeletal: Negative for arthralgias, back pain, joint swelling and neck pain.  Skin: Negative for rash.  Allergic/Immunologic: Negative.   Neurological: Negative for tremors and numbness.  Hematological: Negative for adenopathy. Does not bruise/bleed easily.  Psychiatric/Behavioral: Negative for behavioral problems and sleep disturbance. The patient is not nervous/anxious.     Vital Signs: Ht 5\' 4"  (1.626 m)   Wt 248 lb (112.5 kg)   BMI 42.57 kg/m    Observation/Objective:  Well sounding, NAD noted.    Assessment/Plan: 1. Positive fecal occult blood test Discussed need for eval  from GI.  She agreed to call her GI at [facility].    2. Type 2 diabetes mellitus with hyperglycemia, without long-term current use of insulin (HCC) Stable, continue to follow.   General Counseling: Keri verbalizes understanding of the findings of today's phone visit and agrees with plan of treatment. I have discussed any further diagnostic evaluation that may be needed or ordered today. We also reviewed her medications today. she has been encouraged to call the office with any questions or concerns that should arise related to todays visit.    No orders of the defined types were placed in this encounter.   No orders of the defined types were placed in this encounter.   Time spent: [place] [facility] Internal medicine

## 2019-07-03 ENCOUNTER — Telehealth: Payer: Self-pay

## 2019-07-03 NOTE — Progress Notes (Signed)
Scanned in bioiq lab results. Laura Chen

## 2019-07-03 NOTE — Telephone Encounter (Signed)
Scanned in home lab fecal results, per office note pt was going to call her [facility] provider. Laura Chen

## 2019-07-04 ENCOUNTER — Telehealth: Payer: Self-pay

## 2019-07-04 NOTE — Telephone Encounter (Signed)
Confirmed appointment on 07/08/2019 and screened for covid. [facility] 

## 2019-07-08 ENCOUNTER — Ambulatory Visit: Payer: Medicare Other | Admitting: Internal Medicine

## 2019-07-08 ENCOUNTER — Other Ambulatory Visit: Payer: Self-pay

## 2019-07-08 ENCOUNTER — Encounter: Payer: Self-pay | Admitting: Internal Medicine

## 2019-07-08 VITALS — BP 131/73 | HR 87 | Temp 97.5°F | Resp 16 | Ht 64.0 in | Wt 241.0 lb

## 2019-07-08 DIAGNOSIS — I700 Atherosclerosis of aorta: Secondary | ICD-10-CM

## 2019-07-08 DIAGNOSIS — J449 Chronic obstructive pulmonary disease, unspecified: Secondary | ICD-10-CM | POA: Diagnosis not present

## 2019-07-08 DIAGNOSIS — M0579 Rheumatoid arthritis with rheumatoid factor of multiple sites without organ or systems involvement: Secondary | ICD-10-CM

## 2019-07-08 NOTE — Progress Notes (Signed)
[facility] [place], [place] [address]  Pulmonary Sleep Medicine   Office Visit Note  Patient Name: Laura Chen DOB: Aug 02, 1965 MRN [id number]  Date of Service: 07/08/2019  Complaints/HPI: Pt is here for pulmonary follow up.  She has a history of RA with cavitary lesions of the lungs.  She had a follow up CT with multiple findings.  The cavitary lesions have a continued presence, although some are smaller than previous scan.  She denies any new or worsening symptoms.  She does have some ongoing back pain, that she is seeing neuro for in the next few weeks to discuss thearpy.   ROS  General: (-) fever, (-) chills, (-) night sweats, (-) weakness Skin: (-) rashes, (-) itching,. Eyes: (-) visual changes, (-) redness, (-) itching. Nose and Sinuses: (-) nasal stuffiness or itchiness, (-) postnasal drip, (-) nosebleeds, (-) sinus trouble. Mouth and Throat: (-) sore throat, (-) hoarseness. Neck: (-) swollen glands, (-) enlarged thyroid, (-) neck pain. Respiratory: - cough, (-) bloody sputum, - shortness of breath, - wheezing. Cardiovascular: - ankle swelling, (-) chest pain. Lymphatic: (-) lymph node enlargement. Neurologic: (-) numbness, (-) tingling. Psychiatric: (-) anxiety, (-) depression   Current Medication: Outpatient Encounter Medications as of 07/08/2019  Medication Sig Note  . Accu-Chek FastClix Lancets MISC USE TO TEST BLOOD SUGAR QD UTD DX E11.65   . aspirin EC 81 MG tablet Take 81 mg by mouth every evening.    . baclofen (LIORESAL) 10 MG tablet Take 10 mg by mouth 3 (three) times daily.   Marland Kitchen buPROPion (WELLBUTRIN SR) 100 MG 12 hr tablet Take 1 tablet (100 mg total) by mouth daily.   . cetirizine (ZYRTEC) 10 MG tablet Take 10 mg by mouth.   . cholecalciferol (VITAMIN D) 1000 units tablet Take 2,000 Units by mouth daily.   . DULoxetine (CYMBALTA) 20 MG capsule Take 1 capsule (20 mg total) by mouth daily.   . Evolocumab (REPATHA) 140 MG/ML SOSY  Inject 140 mg into the skin every 14 (fourteen) days.   . fluconazole (DIFLUCAN) 150 MG tablet Take one tablet every other day for 5 doses   . fluticasone (FLONASE) 50 MCG/ACT nasal spray USE 1 SPRAY IN BOTH  NOSTRILS DAILY.   Marland Kitchen glucose blood (ACCU-CHEK GUIDE) test strip USE TO CHECK BLOOD GLUCOSE  EVERY DAY AS DIRECTED   . levothyroxine (SYNTHROID) 50 MCG tablet Take 1 tablet (50 mcg total) by mouth daily.   Marland Kitchen linaclotide (LINZESS) 290 MCG CAPS capsule Take 1 capsule (290 mcg total) by mouth daily.   . norethindrone (NORLYDA) 0.35 MG tablet Take 1 tablet by mouth daily.   Marland Kitchen omeprazole (PRILOSEC) 20 MG capsule Take 1 capsule (20 mg total) by mouth daily.   . Oxycodone HCl 10 MG TABS Take 10 mg by mouth 3 (three) times daily as needed (for pain).   . ponatinib HCl (ICLUSIG) 15 MG tablet Take 15 mg by mouth daily.  12/09/2018: Reports taking daily  . predniSONE (DELTASONE) 5 MG tablet Take 5 mg by mouth daily with breakfast.   . predniSONE (STERAPRED UNI-PAK 21 TAB) 10 MG (21) TBPK tablet 1Use as directed for 6 days for sciatica pain and hold prednisone 5 mg when she is on taper   . rosuvastatin (CRESTOR) 5 MG tablet TAKE 1 TABLET BY MOUTH  DAILY WITH SUPPER   . Semaglutide,0.25 or 0.5MG /DOS, (OZEMPIC, 0.25 OR 0.5 MG/DOSE,) 2 MG/1.5ML SOPN Inject 0.5 mg into the skin once a week. Take  0.25mg  [place] once weekly for three weeks, then increase to 0.5mg  [place] weekly.   Marland Kitchen spironolactone (ALDACTONE) 50 MG tablet daily.   . traMADol (ULTRAM) 50 MG tablet Take 2 tablets (100 mg total) by mouth every 12 (twelve) hours as needed.   . [DISCONTINUED] ciprofloxacin (CIPRO) 500 MG tablet Take 1 tablet (500 mg total) by mouth 2 (two) times daily. (Patient not taking: Reported on 06/30/2019)    No facility-administered encounter medications on file as of 07/08/2019.    Surgical History: Past Surgical History:  Procedure Laterality Date  . DEXA  2010   osteopenia  . MYOMECTOMY  2008  . right ankle AFO  2011  .  TRANSTHORACIC ECHOCARDIOGRAM  123456   nl systolic/diastolic fxn, EF [id number], mild MR, normal PA pressures  . US/HSG  05/2010   possible endometrial polyp, rec rpt 8 wks continue loestrin 24 [facility])    Medical History: Past Medical History:  Diagnosis Date  . Asthma   . CML (chronic myeloid leukemia) ([place]) 2011   (Dr. Alyse Low) stopped gleevec 2/2 side effects  . Diabetes mellitus without complication ([place])   . GERD (gastroesophageal reflux disease)   . History of shingles   . History of syphilis 1990   s/p treatment  . Hypertension   . Osteopenia    due to prednisone use, was on reclast  . PUD (peptic ulcer disease)   . Rheumatoid arthritis(714.0) 1990s   Jacoud's arthropathy, previously treated with MTX, TNFa (remicade, enbrel, humira, orencia, rituximab, and actemra)  . SLE (systemic lupus erythematosus) ([place]) 09/2009   Rheum-Dr. Joan Mayans  . Thyroid goiter    Endo-Dr. Eddie Dibbles    Family History: Family History  Problem Relation Age of Onset  . Heart attack Maternal Grandmother   . Coronary artery disease Maternal Grandmother   . Hypertension Mother   . Hypertension Other        Aunt    Social History: Social History   Socioeconomic History  . Marital status: Single    Spouse name: Not on file  . Number of children: Not on file  . Years of education: Not on file  . Highest education level: Not on file  Occupational History  . Occupation: Secretary/administrator at [employer]  . Smoking status: Former Smoker    Packs/day: 0.30    Types: Cigarettes  . Smokeless tobacco: Never Used  . Tobacco comment: 6 cigarettes/day  Substance and Sexual Activity  . Alcohol use: No  . Drug use: No  . Sexual activity: Not on file  Other Topics Concern  . Not on file  Social History Narrative   Caffeine: 1 cup/day coffee      Lives with mother, 1 dog      HS education   Social Determinants of Health   Financial Resource Strain: Low Risk   . Difficulty of  Paying Living Expenses: Not hard at all  Food Insecurity: No Food Insecurity  . Worried About Charity fundraiser in the Last Year: Never true  . Ran Out of Food in the Last Year: Never true  Transportation Needs: No Transportation Needs  . Lack of Transportation (Medical): No  . Lack of Transportation (Non-Medical): No  Physical Activity: Insufficiently Active  . Days of Exercise per Week: 3 days  . Minutes of Exercise per Session: 20 min  Stress:   . Feeling of Stress :   Social Connections:   . Frequency of Communication with Friends and Family:   .  Frequency of Social Gatherings with Friends and Family:   . Attends Religious Services:   . Active Member of Clubs or Organizations:   . Attends Archivist Meetings:   Marland Kitchen Marital Status:   Intimate Partner Violence: Not At Risk  . Fear of Current or Ex-Partner: No  . Emotionally Abused: No  . Physically Abused: No  . Sexually Abused: No    Vital Signs: Blood pressure 131/73, pulse 87, temperature (!) 97.5 F (36.4 C), resp. rate 16, height 5\' 4"  (1.626 m), weight 241 lb (109.3 kg), SpO2 98 %.  Examination: General Appearance: The patient is well-developed, well-nourished, and in no distress. Skin: Gross inspection of skin unremarkable. Head: normocephalic, no gross deformities. Eyes: no gross deformities noted. ENT: ears appear grossly normal no exudates. Neck: Supple. No thyromegaly. No LAD. Respiratory: clear bilaterally. Cardiovascular: Normal S1 and S2 without murmur or rub. Extremities: No cyanosis. pulses are equal. Neurologic: Alert and oriented. No involuntary movements.  LABS: Recent Results (from the past 2160 hour(s))  CULTURE, URINE COMPREHENSIVE     Status: None   Collection Time: 06/09/19 12:00 AM   Specimen: Urine   URINE  Result Value Ref Range   Urine Culture, Comprehensive Final report    Organism ID, Bacteria Comment     Comment: Mixed urogenital flora 25,000-50,000 colony forming units  per mL   POCT HgB A1C     Status: Abnormal   Collection Time: 06/09/19  2:37 PM  Result Value Ref Range   Hemoglobin A1C 6.4 (A) 4.0 - 5.6 %   HbA1c POC (<> result, manual entry)     HbA1c, POC (prediabetic range)     HbA1c, POC (controlled diabetic range)    POCT Urinalysis Dipstick     Status: Abnormal   Collection Time: 06/09/19  2:38 PM  Result Value Ref Range   Color, UA     Clarity, UA     Glucose, UA Negative Negative   Bilirubin, UA negative    Ketones, UA negative    Spec Grav, UA 1.020 1.010 - 1.025   Blood, UA large    pH, UA 5.0 5.0 - 8.0   Protein, UA Positive (A) Negative   Urobilinogen, UA 0.2 0.2 or 1.0 E.U./dL   Nitrite, UA negative    Leukocytes, UA Moderate (2+) (A) Negative   Appearance     Odor      Radiology: CT Chest W Contrast  Result Date: 06/10/2019 CLINICAL DATA:  Shortness of breath follow up cavitary pneumonia. Patient with history of lupus and rheumatoid arthritis. EXAM: CT CHEST WITH CONTRAST TECHNIQUE: Multidetector CT imaging of the chest was performed during intravenous contrast administration. CONTRAST:  52mL OMNIPAQUE IOHEXOL 300 MG/ML  SOLN COMPARISON:  Most recent radiograph 06/28/2017. Most recent chest CT 10/06/2013 FINDINGS: Cardiovascular: The thoracic aorta is normal in caliber. Heart is normal in size. Mild anterior pericardial thickening with multifocal pericardial calcifications. No significant pericardial effusion. Mediastinum/Nodes: No enlarged mediastinal or hilar lymph nodes. No axillary adenopathy. Tiny hiatal hernia, esophagus is otherwise unremarkable. No visualized thyroid nodule. Lungs/Pleura: Majority of the bilateral pulmonary nodules, many of which are cavitating in both lungs have completely resolved. Scattered areas of linear and irregular bandlike scarring persist, with slight fissural nodularity of the right inter lobar fissures. There are 2 areas of residual nodularity. Spiculated nodule measuring 14 x 9 mm in the left  upper lobe, series 4, image 40, decreased in size from prior exam. Left lower lobe pulmonary nodule measuring 10  x 11 mm series 4, image 95 with irregular margins, also diminished from prior exam. This is a small residual central cavitary component. Mild chronic right lung base pleural thickening and evidence of prior talc pleurodesis with pleural calcifications. Mild bilateral lower lobe bronchiectasis. No significant pleural effusion. Trachea and mainstem bronchi are patent. Upper Abdomen: Nodular hepatic contours again seen. Decreased hepatic density. Small hiatal hernia. Musculoskeletal: No focal bone lesion. Chronic changes of both shoulders more typical of osteoarthritis. IMPRESSION: 1. Majority of the bilateral pulmonary nodules, many of which were cavitating, have resolved with residual linear and bandlike scarring. There are 2 areas of residual nodularity, largest measuring 14 x 9 mm in the left upper lobe. Small cavitary nodule persists in the left lower lobe measuring 10 mm. 2. Chronic right lung base pleural thickening and evidence of prior talc pleurodesis with pleural calcifications. 3. Pericardial thickening with areas of pericardial calcification. 4. Nodular hepatic contours suggest of mild cirrhosis. Small hiatal hernia. Aortic Atherosclerosis (ICD10-I70.0). Electronically Signed   By: Keith Rake M.D.   On: 06/10/2019 23:25    No results found.  CT Chest W Contrast  Result Date: 06/10/2019 CLINICAL DATA:  Shortness of breath follow up cavitary pneumonia. Patient with history of lupus and rheumatoid arthritis. EXAM: CT CHEST WITH CONTRAST TECHNIQUE: Multidetector CT imaging of the chest was performed during intravenous contrast administration. CONTRAST:  58mL OMNIPAQUE IOHEXOL 300 MG/ML  SOLN COMPARISON:  Most recent radiograph 06/28/2017. Most recent chest CT 10/06/2013 FINDINGS: Cardiovascular: The thoracic aorta is normal in caliber. Heart is normal in size. Mild anterior pericardial  thickening with multifocal pericardial calcifications. No significant pericardial effusion. Mediastinum/Nodes: No enlarged mediastinal or hilar lymph nodes. No axillary adenopathy. Tiny hiatal hernia, esophagus is otherwise unremarkable. No visualized thyroid nodule. Lungs/Pleura: Majority of the bilateral pulmonary nodules, many of which are cavitating in both lungs have completely resolved. Scattered areas of linear and irregular bandlike scarring persist, with slight fissural nodularity of the right inter lobar fissures. There are 2 areas of residual nodularity. Spiculated nodule measuring 14 x 9 mm in the left upper lobe, series 4, image 40, decreased in size from prior exam. Left lower lobe pulmonary nodule measuring 10 x 11 mm series 4, image 95 with irregular margins, also diminished from prior exam. This is a small residual central cavitary component. Mild chronic right lung base pleural thickening and evidence of prior talc pleurodesis with pleural calcifications. Mild bilateral lower lobe bronchiectasis. No significant pleural effusion. Trachea and mainstem bronchi are patent. Upper Abdomen: Nodular hepatic contours again seen. Decreased hepatic density. Small hiatal hernia. Musculoskeletal: No focal bone lesion. Chronic changes of both shoulders more typical of osteoarthritis. IMPRESSION: 1. Majority of the bilateral pulmonary nodules, many of which were cavitating, have resolved with residual linear and bandlike scarring. There are 2 areas of residual nodularity, largest measuring 14 x 9 mm in the left upper lobe. Small cavitary nodule persists in the left lower lobe measuring 10 mm. 2. Chronic right lung base pleural thickening and evidence of prior talc pleurodesis with pleural calcifications. 3. Pericardial thickening with areas of pericardial calcification. 4. Nodular hepatic contours suggest of mild cirrhosis. Small hiatal hernia. Aortic Atherosclerosis (ICD10-I70.0). Electronically Signed   By:  Keith Rake M.D.   On: 06/10/2019 23:25      Assessment and Plan: Patient Active Problem List   Diagnosis Date Noted  . BMI 40.0-44.9, adult ([place]) 06/09/2019  . Diet-controlled type 2 diabetes mellitus ([place]) 12/18/2018  . Acute cystitis with  hematuria 12/18/2018  . Vaginal discharge 11/18/2018  . Screening for breast cancer 09/15/2018  . Acquired hypothyroidism 09/15/2018  . Vitamin D deficiency 09/15/2018  . 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 Iron deficiency anemia 06/16/2018  . Acute vaginitis 02/24/2018  . Hematuria 01/23/2018  . Bacterial vaginitis 01/23/2018  . Type 2 diabetes mellitus with hyperglycemia, without long-term current use of insulin ([place]) 12/16/2017  . Mixed hyperlipidemia 12/16/2017  . Depression, major, recurrent, moderate ([place]) 12/16/2017  . Urinary tract infection with hematuria 11/11/2017  . Dysuria 11/11/2017  . Vaginal candidiasis 11/11/2017  . Episode of moderate major depression ([place]) 11/11/2017  . Hypertension 08/14/2017  . Hypokalemia 08/14/2017  . Sepsis ([place]) 06/28/2017  . Acute on chronic kidney failure ([place]) 04/14/2017  . (HFpEF) heart failure with preserved ejection fraction ([place]) 12/12/2016  . Other chest pain 12/12/2016  . Pneumonia 04/30/2015  . Acute renal failure (ARF) ([place]) 04/24/2015  . Elevated LFTs 04/24/2015  . Hyponatremia 04/24/2015  . Influenza A 04/24/2015  . Oral candidiasis 04/24/2015  . Pulmonary nodule 08/26/2014  . Iritis 04/30/2013  . Steroid responder, bilateral 04/30/2013  . Hemoptysis 11/02/2012  . Constipation 05/08/2012  . Complete rupture of rotator cuff 09/19/2011  . 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 Effusion of wrist 11/22/2010  . Hand joint pain 11/22/2010  . Polymenorrhea 10/28/2010  . Anemia 10/28/2010  . External otitis 10/27/2010  . Endometrial polyp 10/27/2010  . Menorrhagia 10/27/2010  . Right flank pain 10/12/2010  . Fatigue 06/23/2010  . Skin rash 06/07/2010  .  Edema 06/07/2010  . Tobacco abuse 06/07/2010  . Tobacco dependence syndrome 06/07/2010  . Primary localized osteoarthrosis, lower leg 05/11/2010  . Chronic myeloid leukemia ([place]) 03/24/2010  . ASTHMA 03/24/2010  . GERD 03/24/2010  . SYSTEMIC LUPUS ERYTHEMATOSUS 03/24/2010  . Rheumatoid arthritis ([place]) 03/24/2010  . FLATULENCE ERUCTATION AND GAS PAIN 03/24/2010  . Asthma 03/24/2010    1. Rheumatoid arthritis involving multiple sites with positive rheumatoid factor (HCC) Continue to monitor cavitary lesions, and follow up with Rheumatology as planned.   2. Chronic bronchitis with COPD (chronic obstructive pulmonary disease) (HCC) Stable, continue current management with meds.   3. Aortic atherosclerosis ([place]) ON CT 06/10/19. Continue crestor    General Counseling: I have discussed the findings of the evaluation and examination with [place].  I have also discussed any further diagnostic evaluation thatmay be needed or ordered today. Saisha verbalizes understanding of the findings of todays visit. We also reviewed her medications today and discussed drug interactions and side effects including but not limited excessive drowsiness and altered mental states. We also discussed that there is always a risk not just to her but also people around her. she has been encouraged to call the office with any questions or concerns that should arise related to todays visit.  No orders of the defined types were placed in this encounter.    Time spent: 30 This patient was seen by Orson Gear AGNP-C in Collaboration with Dr. Devona Konig as a part of collaborative care agreement.   I have personally obtained a history, examined the patient, evaluated laboratory and imaging results, formulated the assessment and plan and placed orders.    Allyne Gee, MD [facility] Pulmonary and Critical Care Sleep medicine

## 2019-07-10 ENCOUNTER — Other Ambulatory Visit: Payer: Self-pay | Admitting: *Deleted

## 2019-07-10 DIAGNOSIS — M170 Bilateral primary osteoarthritis of knee: Principal | ICD-10-CM

## 2019-07-10 MED ORDER — OXYCODONE 10 MG TABLET
ORAL_TABLET | Freq: Three times a day (TID) | ORAL | 0 refills | 30 days | Status: CP | PRN
Start: 2019-07-10 — End: ?

## 2019-07-10 NOTE — Patient Outreach (Signed)
[facility]) Care Management  07/10/2019  Laura Chen 1965/10/06 [id number]   RN Health Coach Quarterly Outreach  Referral Date:08/09/2018 Referral Source:[employer] High Risk List Reason for Referral:Assess Needs Insurance:[employer] Medicare   Outreach Attempt:  Outreach attempt #5 to patient for follow up. No answer. RN Health Coach left HIPAA compliant voicemail message along with contact information.  Plan:  RN Health Coach will make another outreach attempt within the month of June if no return call back from patient.  RN Health Coach will send patient Unsuccessful Letter.  [place] [phone] [name]

## 2019-07-11 ENCOUNTER — Ambulatory Visit: Payer: Self-pay | Admitting: *Deleted

## 2019-07-14 ENCOUNTER — Telehealth: Payer: Self-pay

## 2019-07-14 NOTE — Telephone Encounter (Signed)
Confirmed appointment on 07/15/2019 and screened for covid. [facility] 

## 2019-07-15 ENCOUNTER — Encounter: Payer: Self-pay | Admitting: Nurse Practitioner

## 2019-07-15 ENCOUNTER — Other Ambulatory Visit: Payer: Self-pay

## 2019-07-15 ENCOUNTER — Ambulatory Visit (INDEPENDENT_AMBULATORY_CARE_PROVIDER_SITE_OTHER): Payer: Medicare Other | Admitting: Nurse Practitioner

## 2019-07-15 VITALS — BP 148/80 | Temp 96.8°F | Resp 16 | Ht 64.0 in | Wt 240.2 lb

## 2019-07-15 DIAGNOSIS — N390 Urinary tract infection, site not specified: Secondary | ICD-10-CM | POA: Diagnosis not present

## 2019-07-15 DIAGNOSIS — R300 Dysuria: Secondary | ICD-10-CM

## 2019-07-15 LAB — POCT URINALYSIS DIPSTICK
Bilirubin, UA: NEGATIVE
Blood, UA: NEGATIVE
Glucose, UA: NEGATIVE
Ketones, UA: NEGATIVE
Nitrite, UA: NEGATIVE
Protein, UA: POSITIVE — AB
Spec Grav, UA: 1.01 (ref 1.010–1.025)
Urobilinogen, UA: 0.2 E.U./dL
pH, UA: 6 (ref 5.0–8.0)

## 2019-07-15 MED ORDER — PHENAZOPYRIDINE HCL 200 MG PO TABS: 200.0000 mg | ORAL_TABLET | Freq: Three times a day (TID) | ORAL | 0 refills | Status: DC | PRN

## 2019-07-15 MED ORDER — CIPROFLOXACIN HCL 500 MG PO TABS: 500.0000 mg | ORAL_TABLET | Freq: Two times a day (BID) | ORAL | 0 refills | Status: DC

## 2019-07-15 NOTE — Progress Notes (Signed)
[facility] [place], [place] [address]  Internal MEDICINE  Office Visit Note  Patient Name: Laura Chen  [id number]  [id number]  Date of Service: 07/20/2019  Chief Complaint  Patient presents with  . Urinary Tract Infection    pain and itching     The patient is here for acute visit. She states that she is having vaginal itching and irritation as well burning with urination. Symptoms have been present for about a week. They are unchanged. Denies abdominal pain, nausea, or vomiting. She does have some vaginal discharge which is thin and has no odor. She does have medication to treat bacterial vaginitis and yeast infection. She has been referred to pelvic and vulva clinic at [facility]. Her first appointment with them is 07/29/2019.   Pt is here for a sick visit.     Current Medication:  Outpatient Encounter Medications as of 07/15/2019  Medication Sig Note  . Accu-Chek FastClix Lancets MISC USE TO TEST BLOOD SUGAR QD UTD DX E11.65   . aspirin EC 81 MG tablet Take 81 mg by mouth every evening.    . baclofen (LIORESAL) 10 MG tablet Take 10 mg by mouth 3 (three) times daily.   Marland Kitchen buPROPion (WELLBUTRIN SR) 100 MG 12 hr tablet Take 1 tablet (100 mg total) by mouth daily.   . cetirizine (ZYRTEC) 10 MG tablet Take 10 mg by mouth.   . cholecalciferol (VITAMIN D) 1000 units tablet Take 2,000 Units by mouth daily.   . DULoxetine (CYMBALTA) 20 MG capsule Take 1 capsule (20 mg total) by mouth daily.   . Evolocumab (REPATHA) 140 MG/ML SOSY Inject 140 mg into the skin every 14 (fourteen) days.   . fluconazole (DIFLUCAN) 150 MG tablet Take one tablet every other day for 5 doses   . fluticasone (FLONASE) 50 MCG/ACT nasal spray USE 1 SPRAY IN BOTH  NOSTRILS DAILY.   Marland Kitchen glucose blood (ACCU-CHEK GUIDE) test strip USE TO CHECK BLOOD GLUCOSE  EVERY DAY AS DIRECTED   . levothyroxine (SYNTHROID) 50 MCG tablet Take 1 tablet (50 mcg total) by mouth daily.   Marland Kitchen linaclotide (LINZESS)  290 MCG CAPS capsule Take 1 capsule (290 mcg total) by mouth daily.   . norethindrone (NORLYDA) 0.35 MG tablet Take 1 tablet by mouth daily.   Marland Kitchen omeprazole (PRILOSEC) 20 MG capsule Take 1 capsule (20 mg total) by mouth daily.   . Oxycodone HCl 10 MG TABS Take 10 mg by mouth 3 (three) times daily as needed (for pain).   . ponatinib HCl (ICLUSIG) 15 MG tablet Take 15 mg by mouth daily.  12/09/2018: Reports taking daily  . predniSONE (DELTASONE) 5 MG tablet Take 5 mg by mouth daily with breakfast.   . predniSONE (STERAPRED UNI-PAK 21 TAB) 10 MG (21) TBPK tablet 1Use as directed for 6 days for sciatica pain and hold prednisone 5 mg when she is on taper   . rosuvastatin (CRESTOR) 5 MG tablet TAKE 1 TABLET BY MOUTH  DAILY WITH SUPPER   . Semaglutide,0.25 or 0.5MG /DOS, (OZEMPIC, 0.25 OR 0.5 MG/DOSE,) 2 MG/1.5ML SOPN Inject 0.5 mg into the skin once a week. Take 0.25mg  [place] once weekly for three weeks, then increase to 0.5mg  [place] weekly.   Marland Kitchen spironolactone (ALDACTONE) 50 MG tablet daily.   . traMADol (ULTRAM) 50 MG tablet Take 2 tablets (100 mg total) by mouth every 12 (twelve) hours as needed.   . ciprofloxacin (CIPRO) 500 MG tablet Take 1 tablet (500 mg total) by  mouth 2 (two) times daily.   . phenazopyridine (PYRIDIUM) 200 MG tablet Take 1 tablet (200 mg total) by mouth 3 (three) times daily as needed for pain.    No facility-administered encounter medications on file as of 07/15/2019.      Medical History: Past Medical History:  Diagnosis Date  . Asthma   . CML (chronic myeloid leukemia) ([place]) 2011   (Dr. Alyse Low) stopped gleevec 2/2 side effects  . Diabetes mellitus without complication ([place])   . GERD (gastroesophageal reflux disease)   . History of shingles   . History of syphilis 1990   s/p treatment  . Hypertension   . Osteopenia    due to prednisone use, was on reclast  . PUD (peptic ulcer disease)   . Rheumatoid arthritis(714.0) 1990s   Jacoud's arthropathy, previously treated  with MTX, TNFa (remicade, enbrel, humira, orencia, rituximab, and actemra)  . SLE (systemic lupus erythematosus) ([place]) 09/2009   Rheum-Dr. Joan Mayans  . Thyroid goiter    Endo-Dr. Eddie Dibbles     Today's Vitals   07/15/19 1058  BP: (!) 148/80  Resp: 16  Temp: (!) 96.8 F (36 C)  SpO2: 98%  Weight: 240 lb 3.2 oz (109 kg)   Body mass index is 41.23 kg/m.  Review of Systems  Constitutional: Positive for fatigue. Negative for activity change, chills and unexpected weight change.  HENT: Negative for congestion, postnasal drip, rhinorrhea, sneezing and sore throat.   Respiratory: Negative for cough, chest tightness, shortness of breath and wheezing.   Cardiovascular: Negative for chest pain and palpitations.  Gastrointestinal: Negative for abdominal pain, constipation, diarrhea, nausea and vomiting.  Endocrine: Negative for cold intolerance, heat intolerance, polydipsia and polyuria.       Blood sugars doing well  Controlled without medication.  Genitourinary: Positive for dysuria, flank pain, frequency, hematuria and pelvic pain.  Musculoskeletal: Positive for arthralgias, gait problem and joint swelling. Negative for back pain and neck pain.  Skin: Negative for rash.  Allergic/Immunologic: Negative for environmental allergies.  Neurological: Negative for dizziness, tremors, numbness and headaches.  Hematological: Negative for adenopathy. Does not bruise/bleed easily.  Psychiatric/Behavioral: Negative for behavioral problems (Depression), sleep disturbance and suicidal ideas. The patient is not nervous/anxious.     Physical Exam Vitals and nursing note reviewed.  Constitutional:      General: She is not in acute distress.    Appearance: Normal appearance. She is well-developed. She is not diaphoretic.  HENT:     Head: Normocephalic and atraumatic.     Mouth/Throat:     Pharynx: No oropharyngeal exudate.  Eyes:     Pupils: Pupils are equal, round, and reactive to light.  Neck:      Thyroid: No thyromegaly.     Vascular: No JVD.     Trachea: No tracheal deviation.  Cardiovascular:     Rate and Rhythm: Normal rate and regular rhythm.     Heart sounds: Normal heart sounds. No murmur. No friction rub. No gallop.   Pulmonary:     Effort: Pulmonary effort is normal. No respiratory distress.     Breath sounds: Normal breath sounds. No wheezing or rales.  Chest:     Chest wall: No tenderness.  Abdominal:     General: Bowel sounds are normal.     Palpations: Abdomen is soft.     Tenderness: There is no abdominal tenderness.  Genitourinary:    Comments: Urine sample positive for moderate WBC and protein.  Musculoskeletal:  General: Normal range of motion.     Cervical back: Normal range of motion and neck supple.  Lymphadenopathy:     Cervical: No cervical adenopathy.  Skin:    General: Skin is warm and dry.  Neurological:     Mental Status: She is alert and oriented to person, place, and time. Mental status is at baseline.     Cranial Nerves: No cranial nerve deficit.  Psychiatric:        Mood and Affect: Mood normal.        Behavior: Behavior normal.        Thought Content: Thought content normal.        Judgment: Judgment normal.   Assessment/Plan: 1. Urinary tract infection without hematuria, site unspecified Start cipro 500mg  twice daily for 10 days. Send urine for culture and sensitivity and adjust antibiotic treatment as indicated.  - CULTURE, URINE COMPREHENSIVE - ciprofloxacin (CIPRO) 500 MG tablet; Take 1 tablet (500 mg total) by mouth 2 (two) times daily.  Dispense: 20 tablet; Refill: 0  2. Dysuria Pyridium 200mg  may be taken up to three times daily as needed for bladder pain and spasms.  - POCT Urinalysis Dipstick - phenazopyridine (PYRIDIUM) 200 MG tablet; Take 1 tablet (200 mg total) by mouth 3 (three) times daily as needed for pain.  Dispense: 10 tablet; Refill: 0  General Counseling: Loralye verbalizes understanding of the findings of  todays visit and agrees with plan of treatment. I have discussed any further diagnostic evaluation that may be needed or ordered today. We also reviewed her medications today. she has been encouraged to call the office with any questions or concerns that should arise related to todays visit.    Counseling:  This patient was seen by Leretha Pol FNP Collaboration with Dr Lavera Guise as a part of collaborative care agreement  Orders Placed This Encounter  Procedures  . CULTURE, URINE COMPREHENSIVE  . POCT Urinalysis Dipstick    Meds ordered this encounter  Medications  . phenazopyridine (PYRIDIUM) 200 MG tablet    Sig: Take 1 tablet (200 mg total) by mouth 3 (three) times daily as needed for pain.    Dispense:  10 tablet    Refill:  0    Order Specific Question:   Supervising Provider    Answer:   Lavera Guise [id number]  . ciprofloxacin (CIPRO) 500 MG tablet    Sig: Take 1 tablet (500 mg total) by mouth 2 (two) times daily.    Dispense:  20 tablet    Refill:  0    Order Specific Question:   Supervising Provider    Answer:   Lavera Guise [id number]    Time spent: 30 Minutes

## 2019-07-19 LAB — CULTURE, URINE COMPREHENSIVE

## 2019-07-22 MED ORDER — SPIRONOLACTONE 50 MG TABLET: 50 mg | tablet | Freq: Every day | 0 refills | 90 days | Status: AC

## 2019-07-22 MED ORDER — SPIRONOLACTONE 50 MG TABLET
ORAL_TABLET | Freq: Every day | ORAL | 0 refills | 60.00000 days | Status: CP
Start: 2019-07-22 — End: 2019-07-22

## 2019-07-29 ENCOUNTER — Telehealth: Admit: 2019-07-29 | Discharge: 2019-07-30 | Payer: MEDICARE

## 2019-07-29 DIAGNOSIS — M7918 Myalgia, other site: Secondary | ICD-10-CM | POA: Diagnosis not present

## 2019-07-29 DIAGNOSIS — G8929 Other chronic pain: Secondary | ICD-10-CM | POA: Diagnosis not present

## 2019-07-29 DIAGNOSIS — N94819 Vulvodynia, unspecified: Principal | ICD-10-CM

## 2019-07-29 DIAGNOSIS — G890 Central pain syndrome: Principal | ICD-10-CM

## 2019-07-29 DIAGNOSIS — R102 Pelvic and perineal pain: Principal | ICD-10-CM

## 2019-07-29 MED ORDER — NON FORMULARY
2 refills | 0 days | Status: CP
Start: 2019-07-29 — End: ?

## 2019-07-29 MED ORDER — TIZANIDINE 4 MG TABLET
ORAL_TABLET | 1 refills | 0 days | Status: CP
Start: 2019-07-29 — End: ?

## 2019-08-05 ENCOUNTER — Other Ambulatory Visit: Payer: Self-pay

## 2019-08-05 DIAGNOSIS — E782 Mixed hyperlipidemia: Secondary | ICD-10-CM

## 2019-08-05 MED ORDER — REPATHA 140 MG/ML ~~LOC~~ SOSY: 140.0000 mg | PREFILLED_SYRINGE | SUBCUTANEOUS | 5 refills | Status: DC

## 2019-08-07 ENCOUNTER — Other Ambulatory Visit: Payer: Self-pay

## 2019-08-07 DIAGNOSIS — M0579 Rheumatoid arthritis with rheumatoid factor of multiple sites without organ or systems involvement: Secondary | ICD-10-CM

## 2019-08-07 MED ORDER — TRAMADOL HCL 50 MG PO TABS: 100.0000 mg | ORAL_TABLET | Freq: Two times a day (BID) | ORAL | 1 refills | Status: DC | PRN

## 2019-08-11 ENCOUNTER — Other Ambulatory Visit: Payer: Self-pay

## 2019-08-11 ENCOUNTER — Other Ambulatory Visit: Payer: Self-pay | Admitting: *Deleted

## 2019-08-11 ENCOUNTER — Encounter: Payer: Self-pay | Admitting: Nurse Practitioner

## 2019-08-11 DIAGNOSIS — M0579 Rheumatoid arthritis with rheumatoid factor of multiple sites without organ or systems involvement: Secondary | ICD-10-CM

## 2019-08-11 DIAGNOSIS — M170 Bilateral primary osteoarthritis of knee: Principal | ICD-10-CM

## 2019-08-11 MED ORDER — OXYCODONE 10 MG TABLET
ORAL_TABLET | Freq: Three times a day (TID) | ORAL | 0 refills | 30.00000 days | Status: CP | PRN
Start: 2019-08-11 — End: ?

## 2019-08-11 NOTE — Patient Outreach (Signed)
[facility]) Care Management  08/11/2019  Laura Chen 11-28-1965 [id number]   RN Health Coach Quarterly Outreach  Referral Date:08/09/2018 Referral Source:[employer] High Risk List Reason for Referral:Assess Needs Insurance:[employer]   Outreach Attempt:  Outreach attempt #6 to patient for follow up. No answer. RN Health Coach left HIPAA compliant voicemail message along with contact information.  Plan:  RN Health Coach will make another outreach attempt within the month of July if no return call back from patient.   [name] [phone] [name]

## 2019-08-12 ENCOUNTER — Other Ambulatory Visit: Payer: Self-pay

## 2019-08-24 DIAGNOSIS — N939 Abnormal uterine and vaginal bleeding, unspecified: Principal | ICD-10-CM

## 2019-08-24 MED ORDER — NORLYDA 0.35 MG TABLET
ORAL_TABLET | 1 refills | 0 days
Start: 2019-08-24 — End: ?

## 2019-08-26 ENCOUNTER — Ambulatory Visit
Admit: 2019-08-26 | Discharge: 2019-08-27 | Payer: MEDICARE | Attending: Cardiovascular Disease | Primary: Cardiovascular Disease

## 2019-08-26 DIAGNOSIS — R079 Chest pain, unspecified: Secondary | ICD-10-CM | POA: Diagnosis not present

## 2019-08-26 DIAGNOSIS — C9210 Chronic myeloid leukemia, BCR/ABL-positive, not having achieved remission: Secondary | ICD-10-CM | POA: Diagnosis not present

## 2019-08-26 DIAGNOSIS — M069 Rheumatoid arthritis, unspecified: Secondary | ICD-10-CM | POA: Diagnosis not present

## 2019-08-26 DIAGNOSIS — I10 Essential (primary) hypertension: Secondary | ICD-10-CM | POA: Diagnosis not present

## 2019-08-26 DIAGNOSIS — I5030 Unspecified diastolic (congestive) heart failure: Secondary | ICD-10-CM | POA: Diagnosis not present

## 2019-08-26 MED ORDER — AMLODIPINE 2.5 MG TABLET
ORAL_TABLET | Freq: Every day | ORAL | 6 refills | 30.00000 days | Status: CP
Start: 2019-08-26 — End: 2019-09-25

## 2019-08-28 MED ORDER — NORLYDA 0.35 MG TABLET
ORAL_TABLET | 1 refills | 0 days | Status: CP
Start: 2019-08-28 — End: ?

## 2019-09-03 ENCOUNTER — Encounter: Admit: 2019-09-03 | Discharge: 2019-09-03 | Payer: MEDICARE

## 2019-09-03 ENCOUNTER — Ambulatory Visit
Admit: 2019-09-03 | Discharge: 2019-09-03 | Payer: MEDICARE | Attending: Physical Medicine & Rehabilitation | Primary: Physical Medicine & Rehabilitation

## 2019-09-03 DIAGNOSIS — R079 Chest pain, unspecified: Secondary | ICD-10-CM | POA: Diagnosis not present

## 2019-09-03 DIAGNOSIS — M059 Rheumatoid arthritis with rheumatoid factor, unspecified: Secondary | ICD-10-CM | POA: Diagnosis not present

## 2019-09-03 DIAGNOSIS — R202 Paresthesia of skin: Secondary | ICD-10-CM | POA: Diagnosis not present

## 2019-09-03 DIAGNOSIS — M545 Low back pain: Secondary | ICD-10-CM | POA: Diagnosis not present

## 2019-09-04 ENCOUNTER — Telehealth: Payer: Self-pay

## 2019-09-04 NOTE — Telephone Encounter (Signed)
Confirmed and screened for 09-08-19 ov. Has not been vaccinated.

## 2019-09-05 MED ORDER — MEDROXYPROGESTERONE 10 MG TABLET
ORAL_TABLET | Freq: Every day | ORAL | 0 refills | 90 days | Status: CP
Start: 2019-09-05 — End: 2019-12-04

## 2019-09-08 ENCOUNTER — Encounter: Payer: Self-pay | Admitting: Nurse Practitioner

## 2019-09-08 ENCOUNTER — Ambulatory Visit (INDEPENDENT_AMBULATORY_CARE_PROVIDER_SITE_OTHER): Payer: Medicare Other | Admitting: Nurse Practitioner

## 2019-09-08 ENCOUNTER — Other Ambulatory Visit: Payer: Self-pay

## 2019-09-08 VITALS — BP 139/70 | HR 75 | Temp 97.2°F | Resp 16 | Ht 64.0 in | Wt 234.6 lb

## 2019-09-08 DIAGNOSIS — N761 Subacute and chronic vaginitis: Secondary | ICD-10-CM

## 2019-09-08 DIAGNOSIS — Z1231 Encounter for screening mammogram for malignant neoplasm of breast: Secondary | ICD-10-CM

## 2019-09-08 DIAGNOSIS — E119 Type 2 diabetes mellitus without complications: Secondary | ICD-10-CM | POA: Diagnosis not present

## 2019-09-08 DIAGNOSIS — Z124 Encounter for screening for malignant neoplasm of cervix: Secondary | ICD-10-CM

## 2019-09-08 DIAGNOSIS — R300 Dysuria: Secondary | ICD-10-CM | POA: Diagnosis not present

## 2019-09-08 DIAGNOSIS — Z0001 Encounter for general adult medical examination with abnormal findings: Secondary | ICD-10-CM

## 2019-09-08 DIAGNOSIS — E039 Hypothyroidism, unspecified: Secondary | ICD-10-CM | POA: Diagnosis not present

## 2019-09-08 DIAGNOSIS — E1165 Type 2 diabetes mellitus with hyperglycemia: Secondary | ICD-10-CM

## 2019-09-08 DIAGNOSIS — Z112 Encounter for screening for other bacterial diseases: Secondary | ICD-10-CM | POA: Diagnosis not present

## 2019-09-08 LAB — POCT GLYCOSYLATED HEMOGLOBIN (HGB A1C): Hemoglobin A1C: 5.6 % (ref 4.0–5.6)

## 2019-09-08 NOTE — Progress Notes (Signed)
[facility] [place], [place] [address]  Internal MEDICINE  Office Visit Note  Patient Name: Laura Chen  [id number]  [id number]  Date of Service: 10/01/2019  Chief Complaint  Patient presents with  . Medicare Wellness    Check A1C  . Diabetes  . Gastroesophageal Reflux  . Hypertension  . Quality Metric Gaps    Covid-19, Mammogram, Colonscopy, TDAP     The patient is here for health maintenance exam and pap smear. She states that she has started having significant vaginal pain and irritation along with vaginal discourage. This has been an intermittent problem over the past few years. She does see gynecology on as needed basis. She states that she was advised to have pap smear and to be checked for bacterial and yeast vaginitis. New symptoms started about a week ago. She states that irritation is so painful, it hurts to sit.  She is due to have routine, fasting labs. She has had both COVID 19 vaccines, but has not brought vaccination card and cannot verify the dates. She subsequently got COVID 19 and has completed her full isolation term. She is gradually getting better. We will discuss colon cancer screening. She is due to have a screening mammogram.   Pt is here for routine health maintenance examination  Current Medication: Outpatient Encounter Medications as of 09/08/2019  Medication Sig Note  . Accu-Chek FastClix Lancets MISC USE TO TEST BLOOD SUGAR QD UTD DX E11.65   . aspirin EC 81 MG tablet Take 81 mg by mouth every evening.    . baclofen (LIORESAL) 10 MG tablet Take 10 mg by mouth 3 (three) times daily.   Marland Kitchen buPROPion (WELLBUTRIN SR) 100 MG 12 hr tablet Take 1 tablet (100 mg total) by mouth daily.   . cetirizine (ZYRTEC) 10 MG tablet Take 10 mg by mouth.   . cholecalciferol (VITAMIN D) 1000 units tablet Take 2,000 Units by mouth daily.   . DULoxetine (CYMBALTA) 20 MG capsule Take 1 capsule (20 mg total) by mouth daily.   . Evolocumab (REPATHA)  140 MG/ML SOSY Inject 140 mg into the skin every 14 (fourteen) days.   . fluticasone (FLONASE) 50 MCG/ACT nasal spray USE 1 SPRAY IN BOTH  NOSTRILS DAILY.   Marland Kitchen glucose blood (ACCU-CHEK GUIDE) test strip USE TO CHECK BLOOD GLUCOSE  EVERY DAY AS DIRECTED   . levothyroxine (SYNTHROID) 50 MCG tablet Take 1 tablet (50 mcg total) by mouth daily.   Marland Kitchen linaclotide (LINZESS) 290 MCG CAPS capsule Take 1 capsule (290 mcg total) by mouth daily.   . norethindrone (NORLYDA) 0.35 MG tablet Take 1 tablet by mouth daily.   Marland Kitchen omeprazole (PRILOSEC) 20 MG capsule Take 1 capsule (20 mg total) by mouth daily.   . Oxycodone HCl 10 MG TABS Take 10 mg by mouth 3 (three) times daily as needed (for pain).   . phenazopyridine (PYRIDIUM) 200 MG tablet Take 1 tablet (200 mg total) by mouth 3 (three) times daily as needed for pain.   . ponatinib HCl (ICLUSIG) 15 MG tablet Take 15 mg by mouth daily.  12/09/2018: Reports taking daily  . predniSONE (DELTASONE) 5 MG tablet Take 5 mg by mouth daily with breakfast.   . predniSONE (STERAPRED UNI-PAK 21 TAB) 10 MG (21) TBPK tablet 1Use as directed for 6 days for sciatica pain and hold prednisone 5 mg when she is on taper   . rosuvastatin (CRESTOR) 5 MG tablet TAKE 1 TABLET BY MOUTH  DAILY WITH  SUPPER   . Semaglutide,0.25 or 0.5MG /DOS, (OZEMPIC, 0.25 OR 0.5 MG/DOSE,) 2 MG/1.5ML SOPN Inject 0.5 mg into the skin once a week. Take 0.25mg  [place] once weekly for three weeks, then increase to 0.5mg  [place] weekly.   Marland Kitchen spironolactone (ALDACTONE) 50 MG tablet daily.   . traMADol (ULTRAM) 50 MG tablet Take 2 tablets (100 mg total) by mouth every 12 (twelve) hours as needed.   . [DISCONTINUED] ciprofloxacin (CIPRO) 500 MG tablet Take 1 tablet (500 mg total) by mouth 2 (two) times daily.   . [DISCONTINUED] fluconazole (DIFLUCAN) 150 MG tablet Take one tablet every other day for 5 doses    No facility-administered encounter medications on file as of 09/08/2019.    Surgical History: Past Surgical History:   Procedure Laterality Date  . DEXA  2010   osteopenia  . MYOMECTOMY  2008  . right ankle AFO  2011  . TRANSTHORACIC ECHOCARDIOGRAM  10/1789   nl systolic/diastolic fxn, EF 50%, mild MR, normal PA pressures  . US/HSG  05/2010   possible endometrial polyp, rec rpt 8 wks continue loestrin 24 [facility])    Medical History: Past Medical History:  Diagnosis Date  . Asthma   . CML (chronic myeloid leukemia) ([place]) 2011   (Dr. Alyse Low) stopped gleevec 2/2 side effects  . Diabetes mellitus without complication ([place])   . GERD (gastroesophageal reflux disease)   . History of shingles   . History of syphilis 1990   s/p treatment  . Hypertension   . Osteopenia    due to prednisone use, was on reclast  . PUD (peptic ulcer disease)   . Rheumatoid arthritis(714.0) 1990s   Jacoud's arthropathy, previously treated with MTX, TNFa (remicade, enbrel, humira, orencia, rituximab, and actemra)  . SLE (systemic lupus erythematosus) ([place]) 09/2009   Rheum-Dr. Joan Mayans  . Thyroid goiter    Endo-Dr. Eddie Dibbles    Family History: Family History  Problem Relation Age of Onset  . Heart attack Maternal Grandmother   . Coronary artery disease Maternal Grandmother   . Hypertension Mother   . Hypertension Other        Aunt      Review of Systems  Constitutional: Positive for fatigue. Negative for activity change, chills and unexpected weight change.  HENT: Negative for congestion, postnasal drip, rhinorrhea, sneezing and sore throat.   Respiratory: Negative for cough, chest tightness, shortness of breath and wheezing.   Cardiovascular: Negative for chest pain and palpitations.  Gastrointestinal: Negative for abdominal pain, constipation, diarrhea, nausea and vomiting.  Endocrine: Negative for cold intolerance, heat intolerance, polydipsia and polyuria.       Blood sugars doing well  Controlled without medication.  Genitourinary: Positive for dysuria, frequency, pelvic pain, vaginal discharge and  vaginal pain. Negative for flank pain and hematuria.  Musculoskeletal: Positive for arthralgias, gait problem and joint swelling. Negative for back pain and neck pain.  Skin: Negative for rash.  Allergic/Immunologic: Negative for environmental allergies.  Neurological: Negative for dizziness, tremors, numbness and headaches.  Hematological: Negative for adenopathy. Does not bruise/bleed easily.  Psychiatric/Behavioral: Negative for behavioral problems (Depression), sleep disturbance and suicidal ideas. The patient is not nervous/anxious.     Today's Vitals   09/08/19 1428  BP: 139/70  Pulse: 75  Resp: 16  Temp: (!) 97.2 F (36.2 C)  SpO2: 99%  Weight: 234 lb 9.6 oz (106.4 kg)  Height: 5\' 4"  (1.626 m)   Body mass index is 40.27 kg/m.   Physical Exam Vitals and nursing note reviewed.  Constitutional:      General: She is not in acute distress.    Appearance: Normal appearance. She is well-developed. She is not diaphoretic.  HENT:     Head: Normocephalic and atraumatic.     Nose: Nose normal.     Mouth/Throat:     Pharynx: No oropharyngeal exudate.  Eyes:     Pupils: Pupils are equal, round, and reactive to light.  Neck:     Thyroid: No thyromegaly.     Vascular: No carotid bruit or JVD.     Trachea: No tracheal deviation.  Cardiovascular:     Rate and Rhythm: Normal rate and regular rhythm.     Pulses: Normal pulses.     Heart sounds: Normal heart sounds. No murmur heard.  No friction rub. No gallop.   Pulmonary:     Effort: Pulmonary effort is normal. No respiratory distress.     Breath sounds: Normal breath sounds. No wheezing or rales.  Chest:     Chest wall: No tenderness.     Breasts:        Right: Normal. No swelling, bleeding, inverted nipple, mass, nipple discharge, skin change or tenderness.        Left: Normal. No swelling, bleeding, inverted nipple, mass, nipple discharge, skin change or tenderness.  Abdominal:     General: Bowel sounds are normal.      Palpations: Abdomen is soft.     Tenderness: There is no abdominal tenderness.  Genitourinary:    General: Normal vulva.     Exam position: Supine.     Labia:        Right: Tenderness present.        Left: Tenderness present.      Vagina: Vaginal discharge, erythema and tenderness present.     Cervix: Cervical motion tenderness and erythema present.     Uterus: Normal.      Adnexa:        Right: Tenderness present.        Left: Tenderness present.      Comments: There is mild tenderness in the pelvis and bilateral adnexa during bimanual exam.  Musculoskeletal:     Cervical back: Normal range of motion and neck supple.  Lymphadenopathy:     Cervical: No cervical adenopathy.     Upper Body:     Right upper body: No axillary adenopathy.     Left upper body: No axillary adenopathy.     Lower Body: No right inguinal adenopathy. No left inguinal adenopathy.  Skin:    General: Skin is warm and dry.  Neurological:     Mental Status: She is alert and oriented to person, place, and time. Mental status is at baseline.     Cranial Nerves: No cranial nerve deficit.  Psychiatric:        Mood and Affect: Mood normal.        Behavior: Behavior normal.        Thought Content: Thought content normal.        Judgment: Judgment normal.    Depression screen [facility] 2/9 09/08/2019 06/09/2019 03/10/2019 12/06/2018 09/06/2018  Decreased Interest 0 0 0 1 0  Down, Depressed, Hopeless 0 1 0 0 0  PHQ - 2 Score 0 1 0 1 0  Altered sleeping - - - - -  Tired, decreased energy - - - - -  Change in appetite - - - - -  Feeling bad or failure about yourself  - - - - -  Trouble concentrating - - - - -  Moving slowly or fidgety/restless - - - - -  Suicidal thoughts - - - - -  PHQ-9 Score - - - - -    Functional Status Survey: Is the patient deaf or have difficulty hearing?: No Does the patient have difficulty seeing, even when wearing glasses/contacts?: No Does the patient have difficulty concentrating,  remembering, or making decisions?: No Does the patient have difficulty walking or climbing stairs?: Yes Does the patient have difficulty dressing or bathing?: Yes Does the patient have difficulty doing errands alone such as visiting a doctor's office or shopping?: Yes  MMSE - [place] Exam 09/08/2019 09/05/2018 06/12/2017  Orientation to time 5 5 5   Orientation to Place 5 5 5   Registration 3 3 3   Attention/ Calculation 5 5 5   Recall 3 3 3   Language- name 2 objects 2 2 2   Language- repeat 1 1 1   Language- follow 3 step command 3 3 3   Language- read & follow direction 1 1 1   Write a sentence 1 1 1   Copy design 1 1 1   Total score 30 30 30     Fall Risk  09/08/2019 06/09/2019 03/10/2019 12/09/2018 12/06/2018  Falls in the past year? 0 0 0 0 0  Number falls in past yr: - - - - -  Injury with Fall? - - - - -  Risk for fall due to : - - - Medication side effect;Impaired mobility -  Follow up - - - Falls prevention discussed;Education provided;Falls evaluation completed -      LABS: Recent Results (from the past 2160 hour(s))  POCT Urinalysis Dipstick     Status: Abnormal   Collection Time: 07/15/19 10:59 AM  Result Value Ref Range   Color, UA     Clarity, UA     Glucose, UA Negative Negative   Bilirubin, UA negative    Ketones, UA negative    Spec Grav, UA 1.010 1.010 - 1.025   Blood, UA negative    pH, UA 6.0 5.0 - 8.0   Protein, UA Positive (A) Negative    Comment: trace   Urobilinogen, UA 0.2 0.2 or 1.0 E.U./dL   Nitrite, UA negative    Leukocytes, UA Moderate (2+) (A) Negative   Appearance     Odor    CULTURE, URINE COMPREHENSIVE     Status: None   Collection Time: 07/15/19  2:37 PM   Specimen: Urine   URINE  Result Value Ref Range   Urine Culture, Comprehensive Final report    Organism ID, Bacteria Comment     Comment: Mixed urogenital flora 10,000-25,000 colony forming units per mL   POCT HgB A1C     Status: Abnormal   Collection Time: 09/08/19  2:56 PM   Result Value Ref Range   Hemoglobin A1C 5.6 4.0 - 5.6 %   HbA1c POC (<> result, manual entry)     HbA1c, POC (prediabetic range)     HbA1c, POC (controlled diabetic range)    Urinalysis, Routine w reflex microscopic     Status: Abnormal   Collection Time: 09/08/19  3:08 PM  Result Value Ref Range   Specific Gravity, UA 1.013 1.005 - 1.030   pH, UA 6.5 5.0 - 7.5   Color, UA Yellow Yellow   Appearance Ur Cloudy (A) Clear   Leukocytes,UA 3+ (A) Negative   Protein,UA Negative Negative/Trace   Glucose, UA Negative Negative   Ketones, UA Negative Negative  RBC, UA Negative Negative   Bilirubin, UA Negative Negative   Urobilinogen, Ur 0.2 0.2 - 1.0 mg/dL   Nitrite, UA Negative Negative   Microscopic Examination See below:     Comment: Microscopic was indicated and was performed.  Microscopic Examination     Status: Abnormal   Collection Time: 09/08/19  3:08 PM   Urine  Result Value Ref Range   WBC, UA >30 (A) 0 - 5 /hpf   RBC None seen 0 - 2 /hpf   Epithelial Cells (non renal) 0-10 0 - 10 /hpf   Casts None seen None seen /lpf   Bacteria, UA Few None seen/Few  IGP, Aptima HPV     Status: None   Collection Time: 09/08/19  3:41 PM  Result Value Ref Range   Interpretation NILM     Comment: NEGATIVE FOR INTRAEPITHELIAL LESION OR MALIGNANCY.   Category NIL     Comment: Negative for Intraepithelial Lesion   Adequacy SECNI     Comment: Satisfactory for evaluation. No endocervical component is identified.   Clinician Provided ICD10 Comment     Comment: Z12.4   Performed by: Comment     Comment: Marlynn Perking, Cytotechnologist (ASCP)   Note: Comment     Comment: The Pap smear is a screening test designed to aid in the detection of premalignant and malignant conditions of the uterine cervix.  It is not a diagnostic procedure and should not be used as the sole means of detecting cervical cancer.  Both false-positive and false-negative reports do occur.    Test Methodology Comment      Comment: This liquid based ThinPrep(R) pap test was screened with the use of an image guided system.    HPV Aptima Negative Negative    Comment: This nucleic acid amplification test detects fourteen high-risk HPV types (16,18,31,33,35,39,45,51,52,56,58,59,66,68) without differentiation.   NuSwab Vaginitis Plus (VG+)     Status: None   Collection Time: 09/08/19  4:16 PM  Result Value Ref Range   Atopobium vaginae Low - 0 Score   BVAB 2 Low - 0 Score   Megasphaera 1 Low - 0 Score    Comment: Calculate total score by adding the 3 individual bacterial vaginosis (BV) marker scores together.  Total score is interpreted as follows: Total score 0-1: Indicates the absence of BV. Total score   2: Indeterminate for BV. Additional clinical                  data should be evaluated to establish a                  diagnosis. Total score 3-6: Indicates the presence of BV. This test was developed and its performance characteristics determined by [facility].  It has not been cleared or approved by the Food and Drug Administration.    Candida albicans, NAA Negative Negative   Candida glabrata, NAA Negative Negative   Trich vag by NAA Negative Negative   Chlamydia trachomatis, NAA Negative Negative   Neisseria gonorrhoeae, NAA Negative Negative    .Assessment/Plan: 1. Encounter for general adult medical examination with abnormal findings Annual health maintenance exam ad pap smear performed today.   2. Type 2 diabetes mellitus without complication, without long-term current use of insulin (HCC) - POCT HgB A1C 5.6 today. Continue ozempic as prescribed   3. Chronic vaginitis Vaginal sample taken to check for bacterial and fungal infections. Will treat as indicated.  - NuSwab Vaginitis Plus (VG+)  4. Acquired hypothyroidism Check  thyroid panel and adjust levothyroxine dosing as indicated.   5. Routine cervical smear - IGP, Aptima HPV  6. Encounter for screening mammogram for malignant  neoplasm of breast - MM DIGITAL SCREENING BILATERAL; Future  7. Dysuria - Urinalysis, Routine w reflex microscopic  General Counseling: Shanikwa verbalizes understanding of the findings of todays visit and agrees with plan of treatment. I have discussed any further diagnostic evaluation that may be needed or ordered today. We also reviewed her medications today. she has been encouraged to call the office with any questions or concerns that should arise related to todays visit.    Counseling:  This patient was seen by Leretha Pol FNP Collaboration with Dr Lavera Guise as a part of collaborative care agreement  Orders Placed This Encounter  Procedures  . Microscopic Examination  . MM DIGITAL SCREENING BILATERAL  . Urinalysis, Routine w reflex microscopic  . NuSwab Vaginitis Plus (VG+)  . POCT HgB A1C     Total time spent: 45 Minutes  Time spent includes review of chart, medications, test results, and follow up plan with the patient.     Lavera Guise, MD  Internal Medicine

## 2019-09-09 LAB — URINALYSIS, ROUTINE W REFLEX MICROSCOPIC
Bilirubin, UA: NEGATIVE
Glucose, UA: NEGATIVE
Ketones, UA: NEGATIVE
Nitrite, UA: NEGATIVE
Protein,UA: NEGATIVE
RBC, UA: NEGATIVE
Specific Gravity, UA: 1.013 (ref 1.005–1.030)
Urobilinogen, Ur: 0.2 mg/dL (ref 0.2–1.0)
pH, UA: 6.5 (ref 5.0–7.5)

## 2019-09-09 LAB — MICROSCOPIC EXAMINATION
Casts: NONE SEEN /lpf
RBC, Urine: NONE SEEN /hpf (ref 0–2)
WBC, UA: >30 /hpf — AB (ref 0–5)

## 2019-09-10 DIAGNOSIS — M170 Bilateral primary osteoarthritis of knee: Principal | ICD-10-CM

## 2019-09-10 LAB — IGP, APTIMA HPV: HPV Aptima: NEGATIVE

## 2019-09-10 MED ORDER — OXYCODONE 10 MG TABLET
ORAL_TABLET | Freq: Three times a day (TID) | ORAL | 0 refills | 30.00000 days | Status: CP | PRN
Start: 2019-09-10 — End: ?

## 2019-09-10 NOTE — Progress Notes (Signed)
Normal pap smear. Waiting results of bacterial/yeast culture. Negative STD screen.

## 2019-09-11 ENCOUNTER — Encounter: Payer: Self-pay | Admitting: Nurse Practitioner

## 2019-09-11 ENCOUNTER — Other Ambulatory Visit: Payer: Self-pay | Admitting: *Deleted

## 2019-09-11 ENCOUNTER — Encounter: Admit: 2019-09-11 | Discharge: 2019-09-12 | Payer: MEDICARE

## 2019-09-11 DIAGNOSIS — R079 Chest pain, unspecified: Secondary | ICD-10-CM | POA: Diagnosis not present

## 2019-09-11 DIAGNOSIS — J984 Other disorders of lung: Secondary | ICD-10-CM | POA: Diagnosis not present

## 2019-09-11 LAB — NUSWAB VAGINITIS PLUS (VG+)
Candida albicans, NAA: NEGATIVE
Candida glabrata, NAA: NEGATIVE
Chlamydia trachomatis, NAA: NEGATIVE
Neisseria gonorrhoeae, NAA: NEGATIVE
Trich vag by NAA: NEGATIVE

## 2019-09-11 MED ORDER — OXYCODONE 5 MG TABLET
ORAL_TABLET | Freq: Three times a day (TID) | ORAL | 0 refills | 30 days | Status: CP | PRN
Start: 2019-09-11 — End: 2019-10-11

## 2019-09-11 NOTE — Patient Outreach (Signed)
[name]  09/11/2019  Laura Chen [birth year] [id number]   RN Health Coach Quarterly Outreach  Referral Date:08/09/2018 Referral Source:[employer] High Risk List Reason for Referral:Assess Needs Insurance:[employer] Medicare   Outreach Attempt:  Outreach attempt #7 to patient for follow up. No answer. RN Health Coach left HIPAA compliant voicemail message along with contact information.  Plan:  RN Health Coach will make another outreach attempt within the month of August if no return call back from patient.   [name] [phone] [name]

## 2019-09-11 NOTE — Progress Notes (Signed)
Can you let her know that all testing was negative. No evidence for bacterial or yeast infection. She had normal pap. All results should be accessible to her GYN provider through epic or care everywhere. Thanks.

## 2019-09-12 ENCOUNTER — Telehealth: Payer: Self-pay

## 2019-09-12 ENCOUNTER — Encounter: Payer: Self-pay | Admitting: Nurse Practitioner

## 2019-09-12 NOTE — Telephone Encounter (Signed)
Is there culture on her urine

## 2019-09-12 NOTE — Telephone Encounter (Signed)
Spoke with pt she had no symptoms for UTI advised her if had any symptoms call us back we can see her and recheck ua and urine culture

## 2019-09-12 NOTE — Telephone Encounter (Signed)
Contacted Pt about negative papsmear results.

## 2019-09-17 ENCOUNTER — Other Ambulatory Visit: Payer: Self-pay | Admitting: Nurse Practitioner

## 2019-09-17 ENCOUNTER — Telehealth: Payer: Self-pay

## 2019-09-17 DIAGNOSIS — N390 Urinary tract infection, site not specified: Secondary | ICD-10-CM

## 2019-09-17 DIAGNOSIS — B370 Candidal stomatitis: Secondary | ICD-10-CM

## 2019-09-17 MED ORDER — FLUCONAZOLE 150 MG PO TABS: ORAL_TABLET | ORAL | 2 refills | Status: DC

## 2019-09-17 MED ORDER — CIPROFLOXACIN HCL 500 MG PO TABS: 500.0000 mg | ORAL_TABLET | Freq: Two times a day (BID) | ORAL | 0 refills | Status: DC

## 2019-09-17 NOTE — Telephone Encounter (Signed)
Sent prescription for cipro 500mg  bid for uti. Added diflucan to take as needed and as prescribed if yeast infection develops. Sent to [facility]

## 2019-09-17 NOTE — Telephone Encounter (Signed)
Informed pt that prescriptions was sent to pharmacy.  dbs

## 2019-09-18 ENCOUNTER — Ambulatory Visit: Admit: 2019-09-18 | Discharge: 2019-09-19 | Payer: MEDICARE

## 2019-09-18 DIAGNOSIS — R8290 Unspecified abnormal findings in urine: Secondary | ICD-10-CM | POA: Diagnosis not present

## 2019-09-18 DIAGNOSIS — N898 Other specified noninflammatory disorders of vagina: Principal | ICD-10-CM

## 2019-09-19 DIAGNOSIS — Z1231 Encounter for screening mammogram for malignant neoplasm of breast: Principal | ICD-10-CM

## 2019-09-26 DIAGNOSIS — C9210 Chronic myeloid leukemia, BCR/ABL-positive, not having achieved remission: Principal | ICD-10-CM

## 2019-09-26 MED ORDER — ICLUSIG 15 MG TABLET
ORAL_TABLET | Freq: Every day | ORAL | 2 refills | 30.00000 days | Status: CP
Start: 2019-09-26 — End: ?

## 2019-10-01 DIAGNOSIS — N761 Subacute and chronic vaginitis: Secondary | ICD-10-CM | POA: Insufficient documentation

## 2019-10-01 DIAGNOSIS — Z0001 Encounter for general adult medical examination with abnormal findings: Secondary | ICD-10-CM | POA: Insufficient documentation

## 2019-10-01 DIAGNOSIS — Z124 Encounter for screening for malignant neoplasm of cervix: Secondary | ICD-10-CM | POA: Insufficient documentation

## 2019-10-03 ENCOUNTER — Telehealth: Payer: Self-pay

## 2019-10-03 NOTE — Telephone Encounter (Signed)
Faxed Cologuard paperwork for patient.

## 2019-10-05 ENCOUNTER — Other Ambulatory Visit: Payer: Self-pay | Admitting: Internal Medicine

## 2019-10-05 DIAGNOSIS — E119 Type 2 diabetes mellitus without complications: Secondary | ICD-10-CM

## 2019-10-06 ENCOUNTER — Other Ambulatory Visit: Payer: Self-pay

## 2019-10-06 DIAGNOSIS — F321 Major depressive disorder, single episode, moderate: Secondary | ICD-10-CM

## 2019-10-06 DIAGNOSIS — F331 Major depressive disorder, recurrent, moderate: Secondary | ICD-10-CM

## 2019-10-06 DIAGNOSIS — R141 Gas pain: Secondary | ICD-10-CM

## 2019-10-06 DIAGNOSIS — E1165 Type 2 diabetes mellitus with hyperglycemia: Secondary | ICD-10-CM

## 2019-10-06 MED ORDER — OMEPRAZOLE 20 MG PO CPDR: 20.0000 mg | DELAYED_RELEASE_CAPSULE | Freq: Every day | ORAL | 1 refills | Status: DC

## 2019-10-06 MED ORDER — ACCU-CHEK GUIDE VI STRP: ORAL_STRIP | 1 refills | Status: DC

## 2019-10-06 MED ORDER — BUPROPION HCL ER (SR) 100 MG PO TB12: 100.0000 mg | ORAL_TABLET | Freq: Every day | ORAL | 3 refills | Status: DC

## 2019-10-06 MED ORDER — DULOXETINE HCL 20 MG PO CPEP: 20.0000 mg | ORAL_CAPSULE | Freq: Every day | ORAL | 1 refills | Status: DC

## 2019-10-08 ENCOUNTER — Encounter: Admit: 2019-10-08 | Discharge: 2019-10-09 | Payer: MEDICARE

## 2019-10-08 ENCOUNTER — Encounter: Admit: 2019-10-08 | Discharge: 2019-10-09 | Payer: MEDICARE | Attending: Adult Health | Primary: Adult Health

## 2019-10-08 DIAGNOSIS — C9210 Chronic myeloid leukemia, BCR/ABL-positive, not having achieved remission: Secondary | ICD-10-CM | POA: Diagnosis not present

## 2019-10-12 DIAGNOSIS — Z1211 Encounter for screening for malignant neoplasm of colon: Secondary | ICD-10-CM | POA: Diagnosis not present

## 2019-10-12 DIAGNOSIS — Z1212 Encounter for screening for malignant neoplasm of rectum: Secondary | ICD-10-CM | POA: Diagnosis not present

## 2019-10-12 MED ORDER — OXYCODONE 5 MG TABLET
ORAL_TABLET | Freq: Three times a day (TID) | ORAL | 0 refills | 30.00000 days | Status: CP | PRN
Start: 2019-10-12 — End: 2019-11-11

## 2019-10-13 ENCOUNTER — Encounter: Payer: Self-pay | Admitting: *Deleted

## 2019-10-13 ENCOUNTER — Other Ambulatory Visit: Payer: Self-pay | Admitting: *Deleted

## 2019-10-13 NOTE — Patient Outreach (Signed)
[name]) Care Management  [place]  10/13/2019   Laura Chen [birth year] [id number]   RN Health Coach Quarterly Outreach   Referral Date:  08/09/2018 Referral Source:  [facility] High Risk List Reason for Referral:  Assess Needs Insurance:  [employer]   Outreach Attempt:  Successful telephone outreach to patient for follow up. HIPAA verified with patient.  Patient reporting she is doing fair, still having some female problems that she is in close contact with her gynecologist.  Denies any recent falls or sick days.  Does report an episode of sciatica and residual leg numbness.  Checks her blood sugars about 3 times a week with fasting ranges of 90's.  Latest Hgb A1C is 5.6 on 09/08/2019.  Blood pressure this morning was 173/88.  Patient relates this to pain.  Has taken her medication and blood pressure is down to 121/76.  Encounter Medications:  Outpatient Encounter Medications as of 10/13/2019  Medication Sig Note  . amLODipine (NORVASC) 2.5 MG tablet Take 2.5 mg by mouth daily.   Marland Kitchen ascorbic acid (VITAMIN C) 100 MG tablet Take by mouth.   Marland Kitchen aspirin EC 81 MG tablet Take 81 mg by mouth every evening.    Marland Kitchen buPROPion (WELLBUTRIN SR) 100 MG 12 hr tablet Take 1 tablet (100 mg total) by mouth daily.   . calcium-vitamin D (OSCAL WITH D) 500-200 MG-UNIT TABS tablet Take by mouth.   . cetirizine (ZYRTEC) 10 MG tablet Take 10 mg by mouth.   . cholecalciferol (VITAMIN D) 1000 units tablet Take 2,000 Units by mouth daily.   . DULoxetine (CYMBALTA) 20 MG capsule Take 1 capsule (20 mg total) by mouth daily.   . Evolocumab (REPATHA) 140 MG/ML SOSY Inject 140 mg into the skin every 14 (fourteen) days.   . Fluocinolone Acetonide Body 0.01 % OIL Apply to affected areas on scalp twice daily as needed for itching/redness   . fluticasone (FLONASE) 50 MCG/ACT nasal spray USE 1 SPRAY IN BOTH  NOSTRILS DAILY.   Marland Kitchen levothyroxine (SYNTHROID) 50 MCG tablet Take 1 tablet  (50 mcg total) by mouth daily.   Marland Kitchen linaclotide (LINZESS) 290 MCG CAPS capsule Take 1 capsule (290 mcg total) by mouth daily.   . medroxyPROGESTERone (PROVERA) 10 MG tablet Take by mouth.   . NON FORMULARY Estradiol 0.5mg /g+5% lidocaine compounded in hydrophilic petrolatum. Apply pea-sized amount to opening of vagina BID. Disp one 60g tube   . ondansetron (ZOFRAN) 8 MG tablet every eight (8) hours as needed.   . Oxycodone HCl 10 MG TABS Take 10 mg by mouth 3 (three) times daily as needed (for pain).   . ponatinib HCl (ICLUSIG) 15 MG tablet Take 15 mg by mouth daily.  12/09/2018: Reports taking daily  . predniSONE (DELTASONE) 5 MG tablet Take 5 mg by mouth daily with breakfast.   . Semaglutide,0.25 or 0.5MG /DOS, (OZEMPIC, 0.25 OR 0.5 MG/DOSE,) 2 MG/1.5ML SOPN Inject 0.5 mg into the skin once a week. Take 0.25mg  [place] once weekly for three weeks, then increase to 0.5mg  [place] weekly.   Marland Kitchen spironolactone (ALDACTONE) 50 MG tablet daily.   Marland Kitchen tiZANidine (ZANAFLEX) 4 MG tablet 1-2 tablets PO  every 8 hours prn pain or muscle spasm   . valACYclovir (VALTREX) 500 MG tablet Take by mouth.   . Accu-Chek FastClix Lancets MISC USE TO TEST BLOOD SUGAR  DAILY AS DIRECTED   . baclofen (LIORESAL) 10 MG tablet Take 10 mg by mouth 3 (three) times daily. (Patient not  taking: Reported on 10/13/2019) 10/13/2019: Patient reports no longer taking  . ciprofloxacin (CIPRO) 500 MG tablet Take 1 tablet (500 mg total) by mouth 2 (two) times daily. (Patient not taking: Reported on 10/13/2019) 10/13/2019: Completed course  . fluconazole (DIFLUCAN) 150 MG tablet Take one tablet every other day for 5 doses (Patient not taking: Reported on 10/13/2019) 10/13/2019: Completed course  . glucose blood (ACCU-CHEK GUIDE) test strip USE TO CHECK BLOOD GLUCOSE  EVERY DAY AS DIRECTED   . norethindrone (NORLYDA) 0.35 MG tablet Take 1 tablet by mouth daily. (Patient not taking: Reported on 10/13/2019) 10/13/2019: Reports no longer taking  . omeprazole  (PRILOSEC) 20 MG capsule Take 1 capsule (20 mg total) by mouth daily.   . phenazopyridine (PYRIDIUM) 200 MG tablet Take 1 tablet (200 mg total) by mouth 3 (three) times daily as needed for pain. (Patient not taking: Reported on 10/13/2019)   . predniSONE (STERAPRED UNI-PAK 21 TAB) 10 MG (21) TBPK tablet 1Use as directed for 6 days for sciatica pain and hold prednisone 5 mg when she is on taper   . rosuvastatin (CRESTOR) 5 MG tablet TAKE 1 TABLET BY MOUTH  DAILY WITH SUPPER (Patient not taking: Reported on 10/13/2019)   . traMADol (ULTRAM) 50 MG tablet Take 2 tablets (100 mg total) by mouth every 12 (twelve) hours as needed. (Patient not taking: Reported on 10/13/2019) 10/13/2019: Reports no longer taking   No facility-administered encounter medications on file as of 10/13/2019.    Functional Status:  In your present state of health, do you have any difficulty performing the following activities: 09/08/2019  Hearing? N  Vision? N  Difficulty concentrating or making decisions? N  Walking or climbing stairs? Y  Dressing or bathing? Y  Doing errands, shopping? Y  Preparing Food and eating ? Y  Using the Toilet? N  In the past six months, have you accidently leaked urine? Y  Do you have problems with loss of bowel control? N  Managing your Medications? N  Managing your Finances? N  Housekeeping or managing your Housekeeping? Y  Some recent data might be hidden    Fall/Depression Screening: Fall Risk  10/13/2019 09/08/2019 06/09/2019  Falls in the past year? 0 0 0  Number falls in past yr: 0 - -  Injury with Fall? 0 - -  Risk for fall due to : Impaired mobility;Impaired balance/gait;Medication side effect - -  Follow up Falls evaluation completed;Education provided;Falls prevention discussed - -   PHQ 2/9 Scores 10/13/2019 09/08/2019 06/09/2019 03/10/2019 12/06/2018 09/06/2018 09/05/2018  PHQ - 2 Score 1 0 1 0 1 0 0  PHQ- 9 Score - - - - - - -   Goals Addressed              This Visit's  Progress   .  Patient will report maintaining Hgb A1C of 6 or below within the next 90 days. (pt-stated)        CARE PLAN ENTRY (see longtitudinal plan of care for additional care plan information)  Objective:  Lab Results  Component Value Date   HGBA1C 5.6 09/08/2019 .   Lab Results  Component Value Date   CREATININE 1.28 (H) 09/19/2018   CREATININE 1.33 (H) 09/09/2018   CREATININE 1.30 (H) 01/22/2018 .   Marland Kitchen No results found for: EGFR  Current Barriers:  Marland Kitchen Knowledge Deficits related to basic Diabetes pathophysiology and self care/management  Case Manager Clinical Goal(s):  Over the next 90 days, patient will demonstrate improved adherence  to prescribed treatment plan for diabetes self care/management as evidenced by: maintaining Hgb A1C of 6 or below . Verbalize adherence to ADA/ carb modified diet within the next 90 days . Verbalize adherence to prescribed medication regimen within the next 90 days   Interventions:  . Provided education to patient about basic DM disease process . Reviewed medications with patient and discussed importance of medication adherence . Discussed plans with patient for ongoing care management follow up and provided patient with direct contact information for care management team . Reviewed scheduled/upcoming provider appointments including: follow up appointments with primary care and gynecology . Advised patient, providing education and rationale, to check cbg at least 3 times a week or as provider prescribes and record, calling primary care provider for findings outside established parameters.    Patient Self Care Activities:  . Self administers injectable DM medication (Ozempic) as prescribed . Attends all scheduled provider appointments . Checks blood sugars as prescribed and utilize hyper and hypoglycemia protocol as needed . Adheres to prescribed ADA/carb modified . Continues to monitor blood pressure and notifying provider for sustained  elevations  Initial goal documentation       Appointments:  Attended appointment with primary care provider on 09/08/2019 and has follow up appointment on  01/09/2020.  Plan: RN Health Coach will send primary care provider quarterly update. RN Health Coach will make next telephone outreach to patient within the month of November and patient agrees to future outreach.  Sharon [phone] [email or web address]

## 2019-10-15 ENCOUNTER — Other Ambulatory Visit: Payer: Self-pay

## 2019-10-15 DIAGNOSIS — E1165 Type 2 diabetes mellitus with hyperglycemia: Secondary | ICD-10-CM

## 2019-10-15 DIAGNOSIS — M059 Rheumatoid arthritis with rheumatoid factor, unspecified: Principal | ICD-10-CM

## 2019-10-15 MED ORDER — OZEMPIC (0.25 OR 0.5 MG/DOSE) 2 MG/1.5ML ~~LOC~~ SOPN: 0.5000 mg | PEN_INJECTOR | SUBCUTANEOUS | 2 refills | Status: AC

## 2019-10-15 MED ORDER — FLUTICASONE PROPIONATE 50 MCG/ACT NA SUSP: NASAL | 1 refills | Status: DC

## 2019-10-15 MED ORDER — LINACLOTIDE 290 MCG PO CAPS: 290.0000 ug | ORAL_CAPSULE | Freq: Every day | ORAL | 2 refills | Status: DC

## 2019-10-15 MED ORDER — PREDNISONE 5 MG TABLET
ORAL_TABLET | Freq: Every day | ORAL | 3 refills | 90 days | Status: CP
Start: 2019-10-15 — End: ?

## 2019-10-16 ENCOUNTER — Encounter: Admit: 2019-10-16 | Discharge: 2019-10-17 | Payer: MEDICARE

## 2019-10-16 DIAGNOSIS — Z1231 Encounter for screening mammogram for malignant neoplasm of breast: Secondary | ICD-10-CM | POA: Diagnosis not present

## 2019-10-20 ENCOUNTER — Ambulatory Visit (INDEPENDENT_AMBULATORY_CARE_PROVIDER_SITE_OTHER): Payer: Medicare Other | Admitting: Nurse Practitioner

## 2019-10-20 ENCOUNTER — Other Ambulatory Visit: Payer: Self-pay

## 2019-10-20 ENCOUNTER — Encounter: Payer: Self-pay | Admitting: Nurse Practitioner

## 2019-10-20 VITALS — BP 152/86 | HR 103 | Temp 98.0°F | Resp 16 | Ht 64.0 in | Wt 236.0 lb

## 2019-10-20 DIAGNOSIS — B3731 Acute candidiasis of vulva and vagina: Secondary | ICD-10-CM

## 2019-10-20 DIAGNOSIS — I10 Essential (primary) hypertension: Secondary | ICD-10-CM | POA: Diagnosis not present

## 2019-10-20 DIAGNOSIS — R300 Dysuria: Secondary | ICD-10-CM | POA: Diagnosis not present

## 2019-10-20 DIAGNOSIS — N390 Urinary tract infection, site not specified: Secondary | ICD-10-CM | POA: Diagnosis not present

## 2019-10-20 DIAGNOSIS — B373 Candidiasis of vulva and vagina: Secondary | ICD-10-CM | POA: Diagnosis not present

## 2019-10-20 LAB — POCT URINALYSIS DIPSTICK
Bilirubin, UA: NEGATIVE
Glucose, UA: NEGATIVE
Nitrite, UA: NEGATIVE
Protein, UA: POSITIVE — AB
Spec Grav, UA: 1.01 (ref 1.010–1.025)
Urobilinogen, UA: 0.2 E.U./dL
pH, UA: 6 (ref 5.0–8.0)

## 2019-10-20 MED ORDER — CIPROFLOXACIN HCL 500 MG PO TABS: 500.0000 mg | ORAL_TABLET | Freq: Two times a day (BID) | ORAL | 0 refills | Status: DC

## 2019-10-20 MED ORDER — FLUCONAZOLE 150 MG PO TABS: ORAL_TABLET | ORAL | 2 refills | Status: DC

## 2019-10-20 MED ORDER — CIPROFLOXACIN 500 MG TABLET
0 days
Start: 2019-10-20 — End: 2019-11-06

## 2019-10-20 NOTE — Progress Notes (Signed)
[facility] [place], [place] [address]  Internal MEDICINE  Office Visit Note  Patient Name: Laura Chen  [id number]  [id number]  Date of Service: 10/20/2019   Pt is here for a sick visit.  Chief Complaint  Patient presents with  . Acute Visit    possible uti,burns when using the bathroom     The patient presents for acute visit. She has been having burining with urination for about the past week. She states that she has pelvic pain which is persistent in nature. Hard to tell the difference between the chronic pelvic pain or having uti. She recently had pap smear with pelvic exam. A nuswab test for bacterial vaginitis/STD screen/candidal testing was done at her last visit. All results were normal.        Current Medication:  Outpatient Encounter Medications as of 10/20/2019  Medication Sig Note  . Accu-Chek FastClix Lancets MISC USE TO TEST BLOOD SUGAR  DAILY AS DIRECTED   . amLODipine (NORVASC) 2.5 MG tablet Take 2.5 mg by mouth daily.   Marland Kitchen ascorbic acid (VITAMIN C) 100 MG tablet Take by mouth.   Marland Kitchen aspirin EC 81 MG tablet Take 81 mg by mouth every evening.    Marland Kitchen buPROPion (WELLBUTRIN SR) 100 MG 12 hr tablet Take 1 tablet (100 mg total) by mouth daily.   . calcium-vitamin D (OSCAL WITH D) 500-200 MG-UNIT TABS tablet Take by mouth.   . cetirizine (ZYRTEC) 10 MG tablet Take 10 mg by mouth.   . cholecalciferol (VITAMIN D) 1000 units tablet Take 2,000 Units by mouth daily.   . DULoxetine (CYMBALTA) 20 MG capsule Take 1 capsule (20 mg total) by mouth daily.   . Evolocumab (REPATHA) 140 MG/ML SOSY Inject 140 mg into the skin every 14 (fourteen) days.   . Fluocinolone Acetonide Body 0.01 % OIL Apply to affected areas on scalp twice daily as needed for itching/redness   . fluticasone (FLONASE) 50 MCG/ACT nasal spray USE 1 SPRAY IN BOTH  NOSTRILS DAILY.   Marland Kitchen glucose blood (ACCU-CHEK GUIDE) test strip USE TO CHECK BLOOD GLUCOSE  EVERY DAY AS DIRECTED   .  levothyroxine (SYNTHROID) 50 MCG tablet Take 1 tablet (50 mcg total) by mouth daily.   Marland Kitchen linaclotide (LINZESS) 290 MCG CAPS capsule Take 1 capsule (290 mcg total) by mouth daily.   . medroxyPROGESTERone (PROVERA) 10 MG tablet Take by mouth.   . NON FORMULARY Estradiol 0.5mg /g+5% lidocaine compounded in hydrophilic petrolatum. Apply pea-sized amount to opening of vagina BID. Disp one 60g tube   . norethindrone (NORLYDA) 0.35 MG tablet Take 1 tablet by mouth daily.  10/13/2019: Reports no longer taking  . omeprazole (PRILOSEC) 20 MG capsule Take 1 capsule (20 mg total) by mouth daily.   . ondansetron (ZOFRAN) 8 MG tablet every eight (8) hours as needed.   . Oxycodone HCl 10 MG TABS Take 10 mg by mouth 3 (three) times daily as needed (for pain).   . ponatinib HCl (ICLUSIG) 15 MG tablet Take 15 mg by mouth daily.  12/09/2018: Reports taking daily  . predniSONE (DELTASONE) 5 MG tablet Take 5 mg by mouth daily with breakfast.   . predniSONE (STERAPRED UNI-PAK 21 TAB) 10 MG (21) TBPK tablet 1Use as directed for 6 days for sciatica pain and hold prednisone 5 mg when she is on taper   . rosuvastatin (CRESTOR) 5 MG tablet TAKE 1 TABLET BY MOUTH  DAILY WITH SUPPER   . Semaglutide,0.25 or 0.5MG /DOS, ([place],  0.25 OR 0.5 MG/DOSE,) 2 MG/1.5ML SOPN Inject 0.375 mLs (0.5 mg total) into the skin once a week. Take 0.25mg  [place] once weekly for three weeks, then increase to 0.5mg  [place] weekly.   Marland Kitchen spironolactone (ALDACTONE) 50 MG tablet daily.   Marland Kitchen tiZANidine (ZANAFLEX) 4 MG tablet 1-2 tablets PO  every 8 hours prn pain or muscle spasm   . traMADol (ULTRAM) 50 MG tablet Take 2 tablets (100 mg total) by mouth every 12 (twelve) hours as needed. 10/13/2019: Reports no longer taking  . valACYclovir (VALTREX) 500 MG tablet Take by mouth.   . [DISCONTINUED] phenazopyridine (PYRIDIUM) 200 MG tablet Take 1 tablet (200 mg total) by mouth 3 (three) times daily as needed for pain.   . ciprofloxacin (CIPRO) 500 MG tablet Take 1 tablet  (500 mg total) by mouth 2 (two) times daily.   . fluconazole (DIFLUCAN) 150 MG tablet Take one tablet every other day for 5 doses   . [DISCONTINUED] baclofen (LIORESAL) 10 MG tablet Take 10 mg by mouth 3 (three) times daily. (Patient not taking: Reported on 10/13/2019) 10/13/2019: Patient reports no longer taking  . [DISCONTINUED] ciprofloxacin (CIPRO) 500 MG tablet Take 1 tablet (500 mg total) by mouth 2 (two) times daily. (Patient not taking: Reported on 10/13/2019) 10/13/2019: Completed course  . [DISCONTINUED] fluconazole (DIFLUCAN) 150 MG tablet Take one tablet every other day for 5 doses (Patient not taking: Reported on 10/13/2019) 10/13/2019: Completed course   No facility-administered encounter medications on file as of 10/20/2019.      Medical History: Past Medical History:  Diagnosis Date  . Asthma   . CML (chronic myeloid leukemia) ([place]) 2011   (Dr. Alyse Low) stopped gleevec 2/2 side effects  . Diabetes mellitus without complication ([place])   . GERD (gastroesophageal reflux disease)   . History of shingles   . History of syphilis 1990   s/p treatment  . Hypertension   . Osteopenia    due to prednisone use, was on reclast  . PUD (peptic ulcer disease)   . Rheumatoid arthritis(714.0) 1990s   Jacoud's arthropathy, previously treated with MTX, TNFa (remicade, enbrel, humira, orencia, rituximab, and actemra)  . SLE (systemic lupus erythematosus) ([place]) 09/2009   Rheum-Dr. Joan Mayans  . Thyroid goiter    Endo-Dr. Eddie Dibbles     Vital Signs: BP (!) 152/86 Comment: initial check 160/84  Pulse (!) 103   Temp 98 F (36.7 C)   Resp 16   Ht 5\' 4"  (1.626 m)   Wt 236 lb (107 kg)   SpO2 99%   BMI 40.51 kg/m    Review of Systems  Genitourinary: Positive for dysuria, flank pain, frequency, urgency and vaginal discharge.    Physical Exam Vitals and nursing note reviewed.  Constitutional:      General: She is not in acute distress.    Appearance: Normal appearance. She is  well-developed. She is not diaphoretic.  HENT:     Head: Normocephalic and atraumatic.     Nose: Nose normal.     Mouth/Throat:     Pharynx: No oropharyngeal exudate.  Eyes:     Pupils: Pupils are equal, round, and reactive to light.  Neck:     Thyroid: No thyromegaly.     Vascular: No JVD.     Trachea: No tracheal deviation.  Cardiovascular:     Rate and Rhythm: Normal rate and regular rhythm.     Heart sounds: Normal heart sounds. No murmur heard.  No friction rub. No gallop.  Pulmonary:     Effort: Pulmonary effort is normal. No respiratory distress.     Breath sounds: No wheezing or rales.  Chest:     Chest wall: No tenderness.  Abdominal:     General: Bowel sounds are normal.     Palpations: Abdomen is soft.     Tenderness: There is abdominal tenderness.  Musculoskeletal:        General: Normal range of motion.     Cervical back: Normal range of motion and neck supple.  Lymphadenopathy:     Cervical: No cervical adenopathy.  Skin:    General: Skin is warm and dry.     Comments: U/a is positive for large WBC and trace blood. Positive for protein also.   Neurological:     Mental Status: She is alert and oriented to person, place, and time.     Cranial Nerves: No cranial nerve deficit.  Psychiatric:        Behavior: Behavior normal.        Thought Content: Thought content normal.        Judgment: Judgment normal.   Assessment/Plan: 1. Urinary tract infection without hematuria, site unspecified Start ciprofloxacin 500mg  twice daily for 10 days. Send urine for culture and sensitivity and adjust antibiotics as indicated.  - ciprofloxacin (CIPRO) 500 MG tablet; Take 1 tablet (500 mg total) by mouth 2 (two) times daily.  Dispense: 20 tablet; Refill: 0  2. Dysuria Recommend OTC AZO as needed and as indicated for bladder pain/spasms.  - POCT Urinalysis Dipstick - CULTURE, URINE COMPREHENSIVE  3. Vaginal candidiasis Diflucan 150mg  should be taken every other day for  total of five doses.  - fluconazole (DIFLUCAN) 150 MG tablet; Take one tablet every other day for 5 doses  Dispense: 5 tablet; Refill: 2  4. Elevated blood pressure reading in office with diagnosis of hypertension Will monitor closely.   General Counseling: Laura Chen verbalizes understanding of the findings of todays visit and agrees with plan of treatment. I have discussed any further diagnostic evaluation that may be needed or ordered today. We also reviewed her medications today. she has been encouraged to call the office with any questions or concerns that should arise related to todays visit.    Counseling:  This patient was seen by Leretha Pol FNP Collaboration with Dr Lavera Guise as a part of collaborative care agreement  Orders Placed This Encounter  Procedures  . CULTURE, URINE COMPREHENSIVE  . POCT Urinalysis Dipstick    Meds ordered this encounter  Medications  . ciprofloxacin (CIPRO) 500 MG tablet    Sig: Take 1 tablet (500 mg total) by mouth 2 (two) times daily.    Dispense:  20 tablet    Refill:  0    Order Specific Question:   Supervising Provider    Answer:   Lavera Guise [[id number]]  . fluconazole (DIFLUCAN) 150 MG tablet    Sig: Take one tablet every other day for 5 doses    Dispense:  5 tablet    Refill:  2    Order Specific Question:   Supervising Provider    Answer:   Lavera Guise [[id number]]    Time spent: 30 Minutes

## 2019-10-22 LAB — CULTURE, URINE COMPREHENSIVE

## 2019-10-30 ENCOUNTER — Other Ambulatory Visit: Payer: Self-pay | Admitting: Internal Medicine

## 2019-10-30 ENCOUNTER — Telehealth: Payer: Self-pay

## 2019-10-30 DIAGNOSIS — I10 Essential (primary) hypertension: Secondary | ICD-10-CM

## 2019-10-30 DIAGNOSIS — I5032 Chronic diastolic (congestive) heart failure: Secondary | ICD-10-CM

## 2019-10-30 NOTE — Progress Notes (Signed)
Scanned in cologuard results and pt will get referred to GI for positive results

## 2019-10-30 NOTE — Telephone Encounter (Signed)
Pt advised cologuard is positive and we did GI

## 2019-10-30 NOTE — Telephone Encounter (Signed)
PT notified

## 2019-10-30 NOTE — Telephone Encounter (Signed)
-----   Message from Lavera Guise, MD sent at 10/30/2019 12:50 PM EDT ----- H er Cologuard test is positive, please let her know she needs some labs done, order is entered

## 2019-10-30 NOTE — Telephone Encounter (Signed)
As per heather advised pt we send Gi referral for consultation due to cologuard positive

## 2019-11-05 DIAGNOSIS — I5032 Chronic diastolic (congestive) heart failure: Secondary | ICD-10-CM | POA: Diagnosis not present

## 2019-11-05 DIAGNOSIS — I10 Essential (primary) hypertension: Secondary | ICD-10-CM | POA: Diagnosis not present

## 2019-11-06 ENCOUNTER — Ambulatory Visit: Admit: 2019-11-06 | Discharge: 2019-11-07 | Payer: MEDICARE

## 2019-11-06 DIAGNOSIS — G8929 Other chronic pain: Secondary | ICD-10-CM | POA: Diagnosis not present

## 2019-11-06 DIAGNOSIS — M7918 Myalgia, other site: Secondary | ICD-10-CM | POA: Diagnosis not present

## 2019-11-06 DIAGNOSIS — R102 Pelvic and perineal pain: Principal | ICD-10-CM

## 2019-11-06 DIAGNOSIS — N9489 Other specified conditions associated with female genital organs and menstrual cycle: Principal | ICD-10-CM

## 2019-11-06 DIAGNOSIS — N939 Abnormal uterine and vaginal bleeding, unspecified: Principal | ICD-10-CM

## 2019-11-06 DIAGNOSIS — M62838 Other muscle spasm: Principal | ICD-10-CM

## 2019-11-06 LAB — CBC WITH DIFFERENTIAL
Basophils Absolute: 0.1 10*3/uL (ref 0.0–0.2)
Basos: 1 %
EOS (ABSOLUTE): 0.1 10*3/uL (ref 0.0–0.4)
Eos: 1 %
Hematocrit: 48.9 % — ABNORMAL HIGH (ref 34.0–46.6)
Hemoglobin: 15.3 g/dL (ref 11.1–15.9)
Immature Grans (Abs): 0.3 10*3/uL — ABNORMAL HIGH (ref 0.0–0.1)
Immature Granulocytes: 2 %
Lymphocytes Absolute: 1.8 10*3/uL (ref 0.7–3.1)
Lymphs: 17 %
MCH: 26.2 pg — ABNORMAL LOW (ref 26.6–33.0)
MCHC: 31.3 g/dL — ABNORMAL LOW (ref 31.5–35.7)
MCV: 84 fL (ref 79–97)
Monocytes Absolute: 1.3 10*3/uL — ABNORMAL HIGH (ref 0.1–0.9)
Monocytes: 12 %
Neutrophils Absolute: 7.3 10*3/uL — ABNORMAL HIGH (ref 1.4–7.0)
Neutrophils: 67 %
RBC: 5.84 x10E6/uL — ABNORMAL HIGH (ref 3.77–5.28)
RDW: 19.6 % — ABNORMAL HIGH (ref 11.7–15.4)
WBC: 10.9 10*3/uL — ABNORMAL HIGH (ref 3.4–10.8)

## 2019-11-06 LAB — COMPREHENSIVE METABOLIC PANEL
ALT: 78 IU/L — ABNORMAL HIGH (ref 0–32)
AST: 56 IU/L — ABNORMAL HIGH (ref 0–40)
Albumin/Globulin Ratio: 2.2 (ref 1.2–2.2)
Albumin: 4.2 g/dL (ref 3.8–4.9)
Alkaline Phosphatase: 251 IU/L — ABNORMAL HIGH (ref 44–121)
BUN/Creatinine Ratio: 8 — ABNORMAL LOW (ref 9–23)
BUN: 9 mg/dL (ref 6–24)
Bilirubin Total: 0.3 mg/dL (ref 0.0–1.2)
CO2: 16 mmol/L — ABNORMAL LOW (ref 20–29)
Calcium: 9.5 mg/dL (ref 8.7–10.2)
Chloride: 107 mmol/L — ABNORMAL HIGH (ref 96–106)
Creatinine, Ser: 1.15 mg/dL — ABNORMAL HIGH (ref 0.57–1.00)
GFR calc Af Amer: 64 mL/min/{1.73_m2} (ref >59)
GFR calc non Af Amer: 55 mL/min/{1.73_m2} — ABNORMAL LOW (ref >59)
Globulin, Total: 1.9 g/dL (ref 1.5–4.5)
Glucose: 169 mg/dL — ABNORMAL HIGH (ref 65–99)
Potassium: 4.5 mmol/L (ref 3.5–5.2)
Sodium: 140 mmol/L (ref 134–144)
Total Protein: 6.1 g/dL (ref 6.0–8.5)

## 2019-11-06 MED ORDER — GABAPENTIN 300 MG CAPSULE
ORAL_CAPSULE | 2 refills | 0 days | Status: CP
Start: 2019-11-06 — End: ?

## 2019-11-06 MED ORDER — MEDROXYPROGESTERONE 10 MG TABLET
ORAL_TABLET | Freq: Every day | ORAL | 3 refills | 125 days | Status: CP
Start: 2019-11-06 — End: 2020-02-04

## 2019-11-10 ENCOUNTER — Telehealth: Admit: 2019-11-10 | Discharge: 2019-11-11 | Payer: MEDICARE | Attending: Internal Medicine | Primary: Internal Medicine

## 2019-11-10 DIAGNOSIS — Z79899 Other long term (current) drug therapy: Secondary | ICD-10-CM | POA: Diagnosis not present

## 2019-11-10 DIAGNOSIS — M059 Rheumatoid arthritis with rheumatoid factor, unspecified: Secondary | ICD-10-CM | POA: Diagnosis not present

## 2019-11-12 MED ORDER — OXYCODONE 5 MG TABLET
ORAL_TABLET | Freq: Three times a day (TID) | ORAL | 0 refills | 30.00000 days | Status: CP | PRN
Start: 2019-11-12 — End: 2019-12-12

## 2019-11-14 ENCOUNTER — Ambulatory Visit
Admit: 2019-11-14 | Discharge: 2019-11-15 | Payer: MEDICARE | Attending: Obstetrics & Gynecology | Primary: Obstetrics & Gynecology

## 2019-11-14 DIAGNOSIS — N843 Polyp of vulva: Principal | ICD-10-CM

## 2019-11-14 DIAGNOSIS — N898 Other specified noninflammatory disorders of vagina: Principal | ICD-10-CM

## 2019-11-14 MED ORDER — HYDROCORTISONE ACETATE 25 MG RECTAL SUPPOSITORY
TOPICAL | 0 refills | 0.00000 days | Status: CP
Start: 2019-11-14 — End: ?

## 2019-11-18 ENCOUNTER — Other Ambulatory Visit: Payer: Self-pay

## 2019-11-18 DIAGNOSIS — F321 Major depressive disorder, single episode, moderate: Secondary | ICD-10-CM

## 2019-11-18 MED ORDER — BUPROPION HCL ER (SR) 100 MG PO TB12: 100.0000 mg | ORAL_TABLET | Freq: Every day | ORAL | 3 refills | Status: DC

## 2019-11-18 MED ORDER — HYDROCORTISONE ACETATE 25 MG RECTAL SUPPOSITORY
TOPICAL | 0 refills | 0.00000 days | Status: CP
Start: 2019-11-18 — End: ?

## 2019-12-04 DIAGNOSIS — Z01818 Encounter for other preprocedural examination: Principal | ICD-10-CM

## 2019-12-11 ENCOUNTER — Telehealth: Admit: 2019-12-11 | Discharge: 2019-12-12 | Payer: MEDICARE

## 2019-12-11 DIAGNOSIS — Z01818 Encounter for other preprocedural examination: Secondary | ICD-10-CM | POA: Diagnosis not present

## 2019-12-11 DIAGNOSIS — M329 Systemic lupus erythematosus, unspecified: Secondary | ICD-10-CM | POA: Diagnosis not present

## 2019-12-11 DIAGNOSIS — J45909 Unspecified asthma, uncomplicated: Secondary | ICD-10-CM | POA: Diagnosis not present

## 2019-12-11 DIAGNOSIS — I5030 Unspecified diastolic (congestive) heart failure: Secondary | ICD-10-CM | POA: Diagnosis not present

## 2019-12-11 DIAGNOSIS — I110 Hypertensive heart disease with heart failure: Secondary | ICD-10-CM | POA: Diagnosis not present

## 2019-12-11 DIAGNOSIS — G8929 Other chronic pain: Secondary | ICD-10-CM

## 2019-12-11 DIAGNOSIS — R102 Pelvic and perineal pain: Principal | ICD-10-CM

## 2019-12-11 DIAGNOSIS — I10 Essential (primary) hypertension: Principal | ICD-10-CM

## 2019-12-12 MED ORDER — OXYCODONE 5 MG TABLET
ORAL_TABLET | Freq: Three times a day (TID) | ORAL | 0 refills | 30 days | Status: CP | PRN
Start: 2019-12-12 — End: 2020-01-11

## 2019-12-13 ENCOUNTER — Ambulatory Visit
Admit: 2019-12-13 | Discharge: 2019-12-14 | Payer: MEDICARE | Attending: Physician Assistant | Primary: Physician Assistant

## 2019-12-13 DIAGNOSIS — Z01818 Encounter for other preprocedural examination: Secondary | ICD-10-CM | POA: Diagnosis not present

## 2019-12-13 DIAGNOSIS — Z01812 Encounter for preprocedural laboratory examination: Secondary | ICD-10-CM | POA: Diagnosis not present

## 2019-12-16 ENCOUNTER — Encounter: Admit: 2019-12-16 | Discharge: 2019-12-16 | Payer: MEDICARE

## 2019-12-16 ENCOUNTER — Encounter
Admit: 2019-12-16 | Discharge: 2019-12-16 | Payer: MEDICARE | Attending: Student in an Organized Health Care Education/Training Program | Primary: Student in an Organized Health Care Education/Training Program

## 2019-12-16 DIAGNOSIS — M62838 Other muscle spasm: Secondary | ICD-10-CM | POA: Diagnosis not present

## 2019-12-16 DIAGNOSIS — Z882 Allergy status to sulfonamides status: Secondary | ICD-10-CM | POA: Diagnosis not present

## 2019-12-16 DIAGNOSIS — E785 Hyperlipidemia, unspecified: Secondary | ICD-10-CM | POA: Diagnosis not present

## 2019-12-16 DIAGNOSIS — Z87891 Personal history of nicotine dependence: Secondary | ICD-10-CM | POA: Diagnosis not present

## 2019-12-16 DIAGNOSIS — C9310 Chronic myelomonocytic leukemia not having achieved remission: Secondary | ICD-10-CM | POA: Diagnosis not present

## 2019-12-16 DIAGNOSIS — M810 Age-related osteoporosis without current pathological fracture: Secondary | ICD-10-CM | POA: Diagnosis not present

## 2019-12-16 DIAGNOSIS — I110 Hypertensive heart disease with heart failure: Secondary | ICD-10-CM | POA: Diagnosis not present

## 2019-12-16 DIAGNOSIS — Z79899 Other long term (current) drug therapy: Secondary | ICD-10-CM | POA: Diagnosis not present

## 2019-12-16 DIAGNOSIS — F32A Depression, unspecified: Secondary | ICD-10-CM | POA: Diagnosis not present

## 2019-12-16 DIAGNOSIS — M1990 Unspecified osteoarthritis, unspecified site: Secondary | ICD-10-CM | POA: Diagnosis not present

## 2019-12-16 DIAGNOSIS — D689 Coagulation defect, unspecified: Secondary | ICD-10-CM | POA: Diagnosis not present

## 2019-12-16 DIAGNOSIS — I5032 Chronic diastolic (congestive) heart failure: Secondary | ICD-10-CM | POA: Diagnosis not present

## 2019-12-16 DIAGNOSIS — R234 Changes in skin texture: Secondary | ICD-10-CM | POA: Diagnosis not present

## 2019-12-16 DIAGNOSIS — K219 Gastro-esophageal reflux disease without esophagitis: Secondary | ICD-10-CM | POA: Diagnosis not present

## 2019-12-16 DIAGNOSIS — E039 Hypothyroidism, unspecified: Secondary | ICD-10-CM | POA: Diagnosis not present

## 2019-12-16 DIAGNOSIS — E119 Type 2 diabetes mellitus without complications: Secondary | ICD-10-CM | POA: Diagnosis not present

## 2019-12-16 DIAGNOSIS — Z01818 Encounter for other preprocedural examination: Secondary | ICD-10-CM | POA: Diagnosis not present

## 2019-12-16 DIAGNOSIS — Z7982 Long term (current) use of aspirin: Secondary | ICD-10-CM | POA: Diagnosis not present

## 2019-12-22 ENCOUNTER — Encounter: Admit: 2019-12-22 | Discharge: 2019-12-23 | Payer: MEDICARE

## 2019-12-22 DIAGNOSIS — G8929 Other chronic pain: Secondary | ICD-10-CM | POA: Diagnosis not present

## 2019-12-22 DIAGNOSIS — M7918 Myalgia, other site: Secondary | ICD-10-CM | POA: Diagnosis not present

## 2019-12-22 DIAGNOSIS — R102 Pelvic and perineal pain: Principal | ICD-10-CM

## 2019-12-22 DIAGNOSIS — N9489 Other specified conditions associated with female genital organs and menstrual cycle: Principal | ICD-10-CM

## 2019-12-22 DIAGNOSIS — L409 Psoriasis, unspecified: Principal | ICD-10-CM

## 2019-12-25 ENCOUNTER — Encounter: Admit: 2019-12-25 | Discharge: 2019-12-26 | Payer: MEDICARE

## 2019-12-25 DIAGNOSIS — C9210 Chronic myeloid leukemia, BCR/ABL-positive, not having achieved remission: Secondary | ICD-10-CM | POA: Diagnosis not present

## 2019-12-25 DIAGNOSIS — E119 Type 2 diabetes mellitus without complications: Secondary | ICD-10-CM | POA: Diagnosis not present

## 2019-12-25 DIAGNOSIS — Z5112 Encounter for antineoplastic immunotherapy: Secondary | ICD-10-CM | POA: Diagnosis not present

## 2019-12-25 DIAGNOSIS — M059 Rheumatoid arthritis with rheumatoid factor, unspecified: Secondary | ICD-10-CM | POA: Diagnosis not present

## 2019-12-30 MED ORDER — PEG 3350-ELECTROLYTES 236 GRAM-22.74 GRAM-6.74 GRAM-5.86 GRAM SOLUTION
Freq: Once | ORAL | 0 refills | 1.00000 days | Status: CP
Start: 2019-12-30 — End: 2019-12-30

## 2019-12-31 ENCOUNTER — Encounter
Admit: 2019-12-31 | Discharge: 2020-01-01 | Payer: MEDICARE | Attending: Obstetrics & Gynecology | Primary: Obstetrics & Gynecology

## 2019-12-31 DIAGNOSIS — N898 Other specified noninflammatory disorders of vagina: Principal | ICD-10-CM

## 2020-01-01 ENCOUNTER — Ambulatory Visit: Admit: 2020-01-01 | Discharge: 2020-01-02 | Payer: MEDICARE | Attending: Rheumatology | Primary: Rheumatology

## 2020-01-01 DIAGNOSIS — M059 Rheumatoid arthritis with rheumatoid factor, unspecified: Secondary | ICD-10-CM | POA: Diagnosis not present

## 2020-01-01 DIAGNOSIS — Z79899 Other long term (current) drug therapy: Secondary | ICD-10-CM | POA: Diagnosis not present

## 2020-01-01 DIAGNOSIS — L409 Psoriasis, unspecified: Secondary | ICD-10-CM | POA: Diagnosis not present

## 2020-01-01 MED FILL — PEG 3350-ELECTROLYTES 236 GRAM-22.74 GRAM-6.74 GRAM-5.86 GRAM SOLUTION: 1 days supply | Qty: 4000 | Fill #0 | Status: AC

## 2020-01-01 MED FILL — PEG 3350-ELECTROLYTES 236 GRAM-22.74 GRAM-6.74 GRAM-5.86 GRAM SOLUTION: ORAL | 1 days supply | Qty: 4000 | Fill #0

## 2020-01-08 ENCOUNTER — Encounter
Admit: 2020-01-08 | Discharge: 2020-01-09 | Payer: MEDICARE | Attending: Hematology & Oncology | Primary: Hematology & Oncology

## 2020-01-08 ENCOUNTER — Other Ambulatory Visit: Admit: 2020-01-08 | Discharge: 2020-01-09 | Payer: MEDICARE

## 2020-01-08 DIAGNOSIS — D500 Iron deficiency anemia secondary to blood loss (chronic): Secondary | ICD-10-CM | POA: Diagnosis not present

## 2020-01-08 DIAGNOSIS — C9210 Chronic myeloid leukemia, BCR/ABL-positive, not having achieved remission: Secondary | ICD-10-CM | POA: Diagnosis not present

## 2020-01-09 ENCOUNTER — Encounter: Payer: Self-pay | Admitting: Nurse Practitioner

## 2020-01-09 ENCOUNTER — Other Ambulatory Visit: Payer: Self-pay

## 2020-01-09 ENCOUNTER — Ambulatory Visit (INDEPENDENT_AMBULATORY_CARE_PROVIDER_SITE_OTHER): Payer: Medicare Other | Admitting: Nurse Practitioner

## 2020-01-09 VITALS — BP 128/90 | HR 95 | Temp 97.2°F | Resp 16 | Ht 64.0 in | Wt 244.4 lb

## 2020-01-09 DIAGNOSIS — R142 Eructation: Secondary | ICD-10-CM

## 2020-01-09 DIAGNOSIS — Z23 Encounter for immunization: Secondary | ICD-10-CM

## 2020-01-09 DIAGNOSIS — F331 Major depressive disorder, recurrent, moderate: Secondary | ICD-10-CM

## 2020-01-09 DIAGNOSIS — J300 Vasomotor rhinitis: Secondary | ICD-10-CM

## 2020-01-09 DIAGNOSIS — F321 Major depressive disorder, single episode, moderate: Secondary | ICD-10-CM

## 2020-01-09 DIAGNOSIS — Z0001 Encounter for general adult medical examination with abnormal findings: Secondary | ICD-10-CM | POA: Diagnosis not present

## 2020-01-09 DIAGNOSIS — K219 Gastro-esophageal reflux disease without esophagitis: Secondary | ICD-10-CM

## 2020-01-09 DIAGNOSIS — L209 Atopic dermatitis, unspecified: Secondary | ICD-10-CM

## 2020-01-09 DIAGNOSIS — K581 Irritable bowel syndrome with constipation: Secondary | ICD-10-CM

## 2020-01-09 DIAGNOSIS — M0579 Rheumatoid arthritis with rheumatoid factor of multiple sites without organ or systems involvement: Secondary | ICD-10-CM

## 2020-01-09 DIAGNOSIS — J019 Acute sinusitis, unspecified: Secondary | ICD-10-CM | POA: Diagnosis not present

## 2020-01-09 DIAGNOSIS — R300 Dysuria: Secondary | ICD-10-CM | POA: Diagnosis not present

## 2020-01-09 DIAGNOSIS — E119 Type 2 diabetes mellitus without complications: Secondary | ICD-10-CM | POA: Diagnosis not present

## 2020-01-09 DIAGNOSIS — R143 Flatulence: Secondary | ICD-10-CM

## 2020-01-09 DIAGNOSIS — R141 Gas pain: Secondary | ICD-10-CM

## 2020-01-09 LAB — POCT GLYCOSYLATED HEMOGLOBIN (HGB A1C): Hemoglobin A1C: 7.6 % — AB (ref 4.0–5.6)

## 2020-01-09 MED ORDER — TRAMADOL HCL 50 MG PO TABS
ORAL_TABLET | ORAL | 2 refills | Status: AC
Start: 2020-01-09 — End: ?

## 2020-01-09 MED ORDER — AMOXICILLIN-POT CLAVULANATE 875-125 MG PO TABS: 1.0000 | ORAL_TABLET | Freq: Two times a day (BID) | ORAL | 0 refills | Status: DC

## 2020-01-09 MED ORDER — OMEPRAZOLE 20 MG PO CPDR: 20.0000 mg | DELAYED_RELEASE_CAPSULE | Freq: Every day | ORAL | 3 refills | Status: AC

## 2020-01-09 MED ORDER — DULOXETINE HCL 20 MG PO CPEP: 20.0000 mg | ORAL_CAPSULE | Freq: Every day | ORAL | 3 refills | Status: AC

## 2020-01-09 MED ORDER — OZEMPIC (1 MG/DOSE) 2 MG/1.5ML ~~LOC~~ SOPN: 1.0000 mg | PEN_INJECTOR | SUBCUTANEOUS | 5 refills | Status: AC

## 2020-01-09 MED ORDER — LINACLOTIDE 290 MCG PO CAPS: 290.0000 ug | ORAL_CAPSULE | Freq: Every day | ORAL | 3 refills | Status: AC

## 2020-01-09 MED ORDER — FLUTICASONE PROPIONATE 50 MCG/ACT NA SUSP: NASAL | 3 refills | Status: AC

## 2020-01-09 MED ORDER — BUPROPION HCL ER (SR) 100 MG PO TB12: 100.0000 mg | ORAL_TABLET | Freq: Every day | ORAL | 3 refills | Status: AC

## 2020-01-09 MED ORDER — EUCRISA 2 % EX OINT: TOPICAL_OINTMENT | CUTANEOUS | 2 refills | Status: DC

## 2020-01-09 NOTE — Progress Notes (Signed)
[facility] [place], [place] [address]  Internal MEDICINE  Office Visit Note  Patient Name: Laura Chen  [id number]  [id number]  Date of Service: 02/04/2020   Pt is here for routine health maintenance examination   Chief Complaint  Patient presents with  . Annual Exam  . Diabetes  . Hypertension  . Asthma  . policy update form    received  . Quality Metric Gaps    colorguard     The patient is here for health maintenance exam.  Blood sugars elevated. HgbA1c 7.6 today, up two points from last check. Has made no changes to diet. Unclear why elevated recently. Has been on prednisone a few times due to flare ups of rheumatoid arthritis.  Dermatitis to the cheeks, just under the eyes due to chronic mask wearing.  Sinus pressure and congestion, sinus headache, and mild scratchy throat.  Due for routine, fasting labs.  Most recent mammogram done 09/2019 and was benign after additional images.   Current Medication: Outpatient Encounter Medications as of 01/09/2020  Medication Sig Note  . Accu-Chek FastClix Lancets MISC USE TO TEST BLOOD SUGAR  DAILY AS DIRECTED   . amLODipine (NORVASC) 2.5 MG tablet Take 2.5 mg by mouth daily.   Marland Kitchen ascorbic acid (VITAMIN C) 100 MG tablet Take by mouth.   Marland Kitchen aspirin EC 81 MG tablet Take 81 mg by mouth every evening.    Marland Kitchen buPROPion (WELLBUTRIN SR) 100 MG 12 hr tablet Take 1 tablet (100 mg total) by mouth daily.   . calcium-vitamin D (OSCAL WITH D) 500-200 MG-UNIT TABS tablet Take by mouth.   . cetirizine (ZYRTEC) 10 MG tablet Take 10 mg by mouth.   . cholecalciferol (VITAMIN D) 1000 units tablet Take 2,000 Units by mouth daily.   . DULoxetine (CYMBALTA) 20 MG capsule Take 1 capsule (20 mg total) by mouth daily.   . Fluocinolone Acetonide Body 0.01 % OIL Apply to affected areas on scalp twice daily as needed for itching/redness   . fluticasone (FLONASE) 50 MCG/ACT nasal spray USE 1 SPRAY IN BOTH  NOSTRILS DAILY.   Marland Kitchen  glucose blood (ACCU-CHEK GUIDE) test strip USE TO CHECK BLOOD GLUCOSE  EVERY DAY AS DIRECTED   . levothyroxine (SYNTHROID) 50 MCG tablet Take 1 tablet (50 mcg total) by mouth daily.   Marland Kitchen linaclotide (LINZESS) 290 MCG CAPS capsule Take 1 capsule (290 mcg total) by mouth daily.   Marland Kitchen omeprazole (PRILOSEC) 20 MG capsule Take 1 capsule (20 mg total) by mouth daily.   . ondansetron (ZOFRAN) 8 MG tablet every eight (8) hours as needed.   . ponatinib HCl (ICLUSIG) 15 MG tablet Take 15 mg by mouth daily.  12/09/2018: Reports taking daily  . predniSONE (DELTASONE) 5 MG tablet Take 5 mg by mouth daily with breakfast.   . rosuvastatin (CRESTOR) 5 MG tablet TAKE 1 TABLET BY MOUTH  DAILY WITH SUPPER   . spironolactone (ALDACTONE) 50 MG tablet daily.   Marland Kitchen tiZANidine (ZANAFLEX) 4 MG tablet 1-2 tablets PO  every 8 hours prn pain or muscle spasm   . valACYclovir (VALTREX) 500 MG tablet Take by mouth.   . [DISCONTINUED] buPROPion (WELLBUTRIN SR) 100 MG 12 hr tablet Take 1 tablet (100 mg total) by mouth daily.   . [DISCONTINUED] DULoxetine (CYMBALTA) 20 MG capsule Take 1 capsule (20 mg total) by mouth daily.   . [DISCONTINUED] Evolocumab (REPATHA) 140 MG/ML SOSY Inject 140 mg into the skin every 14 (fourteen) days.   . [  DISCONTINUED] fluticasone (FLONASE) 50 MCG/ACT nasal spray USE 1 SPRAY IN BOTH  NOSTRILS DAILY.   . [DISCONTINUED] linaclotide (LINZESS) 290 MCG CAPS capsule Take 1 capsule (290 mcg total) by mouth daily.   . [DISCONTINUED] omeprazole (PRILOSEC) 20 MG capsule Take 1 capsule (20 mg total) by mouth daily.   . [DISCONTINUED] Oxycodone HCl 10 MG TABS Take 10 mg by mouth 3 (three) times daily as needed (for pain).   . Semaglutide, 1 MG/DOSE, (OZEMPIC, 1 MG/DOSE,) 2 MG/1.5ML SOPN Inject 1 mg into the skin once a week.   . traMADol (ULTRAM) 50 MG tablet Take 1 to 2 tablets po QD prn pain   . [DISCONTINUED] amoxicillin-clavulanate (AUGMENTIN) 875-125 MG tablet Take 1 tablet by mouth 2 (two) times daily.   .  [DISCONTINUED] ciprofloxacin (CIPRO) 500 MG tablet Take 1 tablet (500 mg total) by mouth 2 (two) times daily.   . [DISCONTINUED] Crisaborole (EUCRISA) 2 % OINT Apply small amount to effected area BID prn   . [DISCONTINUED] fluconazole (DIFLUCAN) 150 MG tablet Take one tablet every other day for 5 doses   . [DISCONTINUED] NON FORMULARY Estradiol 0.5mg /g+5% lidocaine compounded in hydrophilic petrolatum. Apply pea-sized amount to opening of vagina BID. Disp one 60g tube   . [DISCONTINUED] norethindrone (NORLYDA) 0.35 MG tablet Take 1 tablet by mouth daily.  10/13/2019: Reports no longer taking  . [DISCONTINUED] predniSONE (STERAPRED UNI-PAK 21 TAB) 10 MG (21) TBPK tablet 1Use as directed for 6 days for sciatica pain and hold prednisone 5 mg when she is on taper   . [DISCONTINUED] traMADol (ULTRAM) 50 MG tablet Take 2 tablets (100 mg total) by mouth every 12 (twelve) hours as needed. 10/13/2019: Reports no longer taking   No facility-administered encounter medications on file as of 01/09/2020.    Surgical History: Past Surgical History:  Procedure Laterality Date  . BOTOX INJECTION     in pelvic muscles  . DEXA  2010   osteopenia  . MYOMECTOMY  2008  . right ankle AFO  2011  . TRANSTHORACIC ECHOCARDIOGRAM  10/4763   nl systolic/diastolic fxn, EF 46%, mild MR, normal PA pressures  . US/HSG  05/2010   possible endometrial polyp, rec rpt 8 wks continue loestrin 24 [facility])    Medical History: Past Medical History:  Diagnosis Date  . Asthma   . CML (chronic myeloid leukemia) ([place]) 2011   (Dr. Alyse Low) stopped gleevec 2/2 side effects  . Diabetes mellitus without complication ([place])   . GERD (gastroesophageal reflux disease)   . History of shingles   . History of syphilis 1990   s/p treatment  . Hypertension   . Osteopenia    due to prednisone use, was on reclast  . Psoriasis of vulva   . PUD (peptic ulcer disease)   . Rheumatoid arthritis(714.0) 1990s   Jacoud's arthropathy,  previously treated with MTX, TNFa (remicade, enbrel, humira, orencia, rituximab, and actemra)  . SLE (systemic lupus erythematosus) ([place]) 09/2009   Rheum-Dr. Joan Mayans  . Thyroid goiter    Endo-Dr. Eddie Dibbles    Family History: Family History  Problem Relation Age of Onset  . Heart attack Maternal Grandmother   . Coronary artery disease Maternal Grandmother   . Hypertension Mother   . Hypertension Other        Aunt      Review of Systems  Constitutional: Negative for activity change, chills, fatigue and unexpected weight change.  HENT: Negative for congestion, postnasal drip, rhinorrhea, sneezing and sore throat.   Respiratory:  Negative for cough, chest tightness, shortness of breath and wheezing.   Cardiovascular: Negative for chest pain and palpitations.  Gastrointestinal: Negative for abdominal pain, constipation, diarrhea, nausea and vomiting.  Endocrine: Negative for cold intolerance, heat intolerance, polydipsia and polyuria.       Elevated blood sugars recently.   Genitourinary: Positive for dysuria. Negative for frequency, hematuria and urgency.  Musculoskeletal: Positive for arthralgias, back pain, gait problem and myalgias. Negative for joint swelling and neck pain.  Skin: Negative for rash.  Allergic/Immunologic: Negative for environmental allergies.  Neurological: Negative for dizziness, tremors, numbness and headaches.  Hematological: Negative for adenopathy. Does not bruise/bleed easily.  Psychiatric/Behavioral: Negative for behavioral problems (Depression), sleep disturbance and suicidal ideas. The patient is not nervous/anxious.      Today's Vitals   01/09/20 1348  BP: 128/90  Pulse: 95  Resp: 16  Temp: (!) 97.2 F (36.2 C)  SpO2: 99%  Weight: 244 lb 6.4 oz (110.9 kg)  Height: 5\' 4"  (1.626 m)   Body mass index is 41.95 kg/m.  Physical Exam Vitals and nursing note reviewed.  Constitutional:      General: She is not in acute distress.    Appearance:  Normal appearance. She is well-developed and well-nourished. She is not diaphoretic.  HENT:     Head: Normocephalic and atraumatic.     Right Ear: Tympanic membrane is erythematous and bulging.     Left Ear: Tympanic membrane is erythematous and bulging.     Nose: Congestion present.     Right Sinus: Maxillary sinus tenderness and frontal sinus tenderness present.     Left Sinus: Maxillary sinus tenderness and frontal sinus tenderness present.     Mouth/Throat:     Mouth: Oropharynx is clear and moist.     Pharynx: No oropharyngeal exudate.  Eyes:     Extraocular Movements: EOM normal.     Pupils: Pupils are equal, round, and reactive to light.  Neck:     Thyroid: No thyromegaly.     Vascular: No carotid bruit or JVD.     Trachea: No tracheal deviation.  Cardiovascular:     Rate and Rhythm: Normal rate and regular rhythm.     Pulses: Normal pulses.     Heart sounds: Normal heart sounds. No murmur heard. No friction rub. No gallop.   Pulmonary:     Effort: Pulmonary effort is normal. No respiratory distress.     Breath sounds: Normal breath sounds. No wheezing or rales.  Chest:     Chest wall: No tenderness.  Abdominal:     General: Bowel sounds are normal.     Palpations: Abdomen is soft.     Tenderness: There is no abdominal tenderness.  Musculoskeletal:        General: Normal range of motion.     Cervical back: Normal range of motion and neck supple.     Comments: The patient has multiple arthritic joint deformities. This is especially noted in the joints of the hands which have multiple contractures in the fingers.   Lymphadenopathy:     Cervical: No cervical adenopathy.  Skin:    General: Skin is warm and dry.     Capillary Refill: Capillary refill takes less than 2 seconds.     Comments: Small eczematous rash along the cheeks, bilaterally.   Neurological:     General: No focal deficit present.     Mental Status: She is alert and oriented to person, place, and time.  Mental status is at  baseline.     Cranial Nerves: No cranial nerve deficit.  Psychiatric:        Mood and Affect: Mood and affect and mood normal.        Behavior: Behavior normal.        Thought Content: Thought content normal.        Judgment: Judgment normal.    Depression screen [facility] 2/9 01/09/2020 10/13/2019 09/08/2019 06/09/2019 03/10/2019  Decreased Interest 0 0 0 0 0  Down, Depressed, Hopeless 0 1 0 1 0  PHQ - 2 Score 0 1 0 1 0  Altered sleeping - - - - -  Tired, decreased energy - - - - -  Change in appetite - - - - -  Feeling bad or failure about yourself  - - - - -  Trouble concentrating - - - - -  Moving slowly or fidgety/restless - - - - -  Suicidal thoughts - - - - -  PHQ-9 Score - - - - -  Some recent data might be hidden    Functional Status Survey: Is the patient deaf or have difficulty hearing?: No Does the patient have difficulty seeing, even when wearing glasses/contacts?: No Does the patient have difficulty concentrating, remembering, or making decisions?: No Does the patient have difficulty walking or climbing stairs?: Yes Does the patient have difficulty dressing or bathing?: Yes Does the patient have difficulty doing errands alone such as visiting a doctor's office or shopping?: Yes  MMSE - Mini Mental State Exam 09/08/2019 09/05/2018 06/12/2017  Orientation to time 5 5 5   Orientation to Place 5 5 5   Registration 3 3 3   Attention/ Calculation 5 5 5   Recall 3 3 3   Language- name 2 objects 2 2 2   Language- repeat 1 1 1   Language- follow 3 step command 3 3 3   Language- read & follow direction 1 1 1   Write a sentence 1 1 1   Copy design 1 1 1   Total score 30 30 30     Fall Risk  01/13/2020 01/09/2020 10/13/2019 09/08/2019 06/09/2019  Falls in the past year? 0 0 0 0 0  Number falls in past yr: 0 - 0 - -  Injury with Fall? 0 - 0 - -  Risk for fall due to : Medication side effect No Fall Risks Impaired mobility;Impaired balance/gait;Medication side effect - -   Follow up Falls evaluation completed;Education provided;Falls prevention discussed Falls evaluation completed Falls evaluation completed;Education provided;Falls prevention discussed - -      LABS: Recent Results (from the past 2160 hour(s))  UA/M w/rflx Culture, Routine     Status: Abnormal   Collection Time: 01/09/20  1:53 PM   Specimen: Urine   Urine  Result Value Ref Range   Specific Gravity, UA 1.024 1.005 - 1.030   pH, UA 5.5 5.0 - 7.5   Color, UA Yellow Yellow   Appearance Ur Cloudy (A) Clear   Leukocytes,UA 2+ (A) Negative   Protein,UA Trace Negative/Trace   Glucose, UA Trace (A) Negative   Ketones, UA Negative Negative   RBC, UA Negative Negative   Bilirubin, UA Negative Negative   Urobilinogen, Ur 0.2 0.2 - 1.0 mg/dL   Nitrite, UA Negative Negative   Microscopic Examination See below:     Comment: Microscopic was indicated and was performed.   Urinalysis Reflex Comment     Comment: This specimen has reflexed to a Urine Culture.  Microscopic Examination     Status: Abnormal   Collection Time:  01/09/20  1:53 PM   Urine  Result Value Ref Range   WBC, UA >30 (A) 0 - 5 /hpf   RBC 0-2 0 - 2 /hpf   Epithelial Cells (non renal) 0-10 0 - 10 /hpf   Casts None seen None seen /lpf   Bacteria, UA Many (A) None seen/Few   Yeast, UA Present (A) None seen  Urine Culture, Reflex     Status: Abnormal   Collection Time: 01/09/20  1:53 PM   Urine  Result Value Ref Range   Urine Culture, Routine CANCELED (A)     Comment: Test not performed. Specimen not received at refrigerated temperature.  Result canceled by the ancillary.   POCT HgB A1C     Status: Abnormal   Collection Time: 01/09/20  2:28 PM  Result Value Ref Range   Hemoglobin A1C 7.6 (A) 4.0 - 5.6 %   HbA1c POC (<> result, manual entry)     HbA1c, POC (prediabetic range)     HbA1c, POC (controlled diabetic range)    POCT Urinalysis Dipstick     Status: Abnormal   Collection Time: 02/02/20  2:06 PM  Result Value  Ref Range   Color, UA     Clarity, UA     Glucose, UA Negative Negative   Bilirubin, UA negative    Ketones, UA negative    Spec Grav, UA 1.010 1.010 - 1.025   Blood, UA negative    pH, UA 6.5 5.0 - 8.0   Protein, UA Positive (A) Negative    Comment: trace   Urobilinogen, UA 0.2 0.2 or 1.0 E.U./dL   Nitrite, UA negative    Leukocytes, UA Moderate (2+) (A) Negative   Appearance     Odor    CULTURE, URINE COMPREHENSIVE     Status: None (Preliminary result)   Collection Time: 02/02/20  2:09 PM   Specimen: Urine   Urine  Result Value Ref Range   Urine Culture, Comprehensive Preliminary report    Organism ID, Bacteria Comment     Comment: Microbiological testing to rule out the presence of possible pathogens is in progress. 10,000-25,000 colony forming units per mL     Assessment/Plan: 1. Encounter for general adult medical examination with abnormal findings ammual health maintenance exam today. Order slip given to get routine, fasting labs done  2. Acute non-recurrent sinusitis, unspecified location Treat with augmentin 875mg  twice daily for 10 days. Rest and increase fluids. Take OTC medication as needed and as indicated for acute symptoms.   3. Vasomotor rhinitis Use flonase nasal spray two sprays in both nostrils daily.  - fluticasone (FLONASE) 50 MCG/ACT nasal spray; USE 1 SPRAY IN BOTH  NOSTRILS DAILY.  Dispense: 48 g; Refill: 3  4. Type 2 diabetes mellitus without complication, without long-term current use of insulin (HCC) - POCT HgB A1C 7.6 today. Large elevation since most recent check. Increase ozempic to 1mg  weekly. Monitor blood sugars closely.  - Semaglutide, 1 MG/DOSE, (OZEMPIC, 1 MG/DOSE,) 2 MG/1.5ML SOPN; Inject 1 mg into the skin once a week.  Dispense: 3 mL; Refill: 5  5. Rheumatoid arthritis involving multiple sites with positive rheumatoid factor ([place]) May take 1 to 2 tablets daily as needed for acute and moderate pain.  - traMADol (ULTRAM) 50 MG tablet;  Take 1 to 2 tablets po QD prn pain  Dispense: 60 tablet; Refill: 2  6. Atopic dermatitis, unspecified type Trial eucrisa - apply te effected areas twice daily as needed.   7. Irritable  bowel syndrome with constipation Stable with linzess 213mcg daily. Refills provided today - linaclotide (LINZESS) 290 MCG CAPS capsule; Take 1 capsule (290 mcg total) by mouth daily.  Dispense: 90 capsule; Refill: 3  8. Gastroesophageal reflux disease without esophagitis Continue omeprazole 20mg  daily - omeprazole (PRILOSEC) 20 MG capsule; Take 1 capsule (20 mg total) by mouth daily.  Dispense: 90 capsule; Refill: 3  10. Dysuria - UA/M w/rflx Culture, Routine  11. Flu vaccine need Flu vaccine administered today.  - Flu Vaccine MDCK QUAD PF  12. Episode of moderate major depression (HCC) Continue wellbutrin daily - buPROPion (WELLBUTRIN SR) 100 MG 12 hr tablet; Take 1 tablet (100 mg total) by mouth daily.  Dispense: 90 tablet; Refill: 3  13. Depression, major, recurrent, moderate (HCC) *continue duloxetine 20mg  daily.  - DULoxetine (CYMBALTA) 20 MG capsule; Take 1 capsule (20 mg total) by mouth daily.  Dispense: 90 capsule; Refill: 3  General Counseling: Laura Chen verbalizes understanding of the findings of todays visit and agrees with plan of treatment. I have discussed any further diagnostic evaluation that may be needed or ordered today. We also reviewed her medications today. she has been encouraged to call the office with any questions or concerns that should arise related to todays visit.    Counseling:  This patient was seen by Leretha Pol FNP Collaboration with Dr Lavera Guise as a part of collaborative care agreement  Orders Placed This Encounter  Procedures  . Microscopic Examination  . Urine Culture, Reflex  . Flu Vaccine MDCK QUAD PF  . UA/M w/rflx Culture, Routine  . POCT HgB A1C    Meds ordered this encounter  Medications  . Semaglutide, 1 MG/DOSE, (OZEMPIC, 1 MG/DOSE,) 2  MG/1.5ML SOPN    Sig: Inject 1 mg into the skin once a week.    Dispense:  3 mL    Refill:  5    Please d/c order for ozempic 0.5mg  weekly. Increasing to 1mg  weekly.    Order Specific Question:   Supervising Provider    Answer:   Lavera Guise [[id number]]  . traMADol (ULTRAM) 50 MG tablet    Sig: Take 1 to 2 tablets po QD prn pain    Dispense:  60 tablet    Refill:  2    Order Specific Question:   Supervising Provider    Answer:   Lavera Guise Nina  . DISCONTD: amoxicillin-clavulanate (AUGMENTIN) 875-125 MG tablet    Sig: Take 1 tablet by mouth 2 (two) times daily.    Dispense:  20 tablet    Refill:  0    Order Specific Question:   Supervising Provider    Answer:   Lavera Guise [[id number]]  . buPROPion (WELLBUTRIN SR) 100 MG 12 hr tablet    Sig: Take 1 tablet (100 mg total) by mouth daily.    Dispense:  90 tablet    Refill:  3    Order Specific Question:   Supervising Provider    Answer:   Lavera Guise [[id number]]  . DULoxetine (CYMBALTA) 20 MG capsule    Sig: Take 1 capsule (20 mg total) by mouth daily.    Dispense:  90 capsule    Refill:  3    Order Specific Question:   Supervising Provider    Answer:   Lavera Guise [[id number]]  . omeprazole (PRILOSEC) 20 MG capsule    Sig: Take 1 capsule (20 mg total) by mouth daily.    Dispense:  90 capsule  Refill:  3    Order Specific Question:   Supervising Provider    Answer:   Lavera Guise [[id number]]  . linaclotide (LINZESS) 290 MCG CAPS capsule    Sig: Take 1 capsule (290 mcg total) by mouth daily.    Dispense:  90 capsule    Refill:  3    Order Specific Question:   Supervising Provider    Answer:   Lavera Guise [[id number]]  . fluticasone (FLONASE) 50 MCG/ACT nasal spray    Sig: USE 1 SPRAY IN BOTH  NOSTRILS DAILY.    Dispense:  48 g    Refill:  3    Order Specific Question:   Supervising Provider    Answer:   Lavera Guise [[id number]]  . DISCONTD: Crisaborole (EUCRISA) 2 % OINT    Sig: Apply small amount to effected area BID prn    Dispense:   100 g    Refill:  2    Order Specific Question:   Supervising Provider    Answer:   Lavera Guise [[id number]]    Total time spent: 32 Minutes  Time spent includes review of chart, medications, test results, and follow up plan with the patient.     Lavera Guise, MD  Internal Medicine

## 2020-01-12 ENCOUNTER — Other Ambulatory Visit: Payer: Self-pay | Admitting: Nurse Practitioner

## 2020-01-12 ENCOUNTER — Encounter: Payer: Self-pay | Admitting: Nurse Practitioner

## 2020-01-12 DIAGNOSIS — L209 Atopic dermatitis, unspecified: Secondary | ICD-10-CM

## 2020-01-12 DIAGNOSIS — M059 Rheumatoid arthritis with rheumatoid factor, unspecified: Principal | ICD-10-CM

## 2020-01-12 MED ORDER — PIMECROLIMUS 1 % EX CREA: TOPICAL_CREAM | Freq: Two times a day (BID) | CUTANEOUS | 1 refills | Status: DC

## 2020-01-12 MED ORDER — OXYCODONE 5 MG TABLET
ORAL_TABLET | Freq: Three times a day (TID) | ORAL | 0 refills | 30 days | Status: CP | PRN
Start: 2020-01-12 — End: 2020-02-11

## 2020-01-13 ENCOUNTER — Other Ambulatory Visit: Payer: Self-pay | Admitting: *Deleted

## 2020-01-13 ENCOUNTER — Encounter: Payer: Self-pay | Admitting: *Deleted

## 2020-01-13 LAB — UA/M W/RFLX CULTURE, ROUTINE
Bilirubin, UA: NEGATIVE
Ketones, UA: NEGATIVE
Nitrite, UA: NEGATIVE
RBC, UA: NEGATIVE
Specific Gravity, UA: 1.024 (ref 1.005–1.030)
Urobilinogen, Ur: 0.2 mg/dL (ref 0.2–1.0)
pH, UA: 5.5 (ref 5.0–7.5)

## 2020-01-13 LAB — MICROSCOPIC EXAMINATION
Casts: NONE SEEN /lpf
WBC, UA: >30 /hpf — AB (ref 0–5)

## 2020-01-13 LAB — URINE CULTURE, REFLEX

## 2020-01-13 NOTE — Patient Outreach (Signed)
[facility]) Care Management  [place]  01/13/2020   Laura Chen 05-Feb-1966 [id number]   RN Health Coach Quarterly Outreach  Referral Date:08/09/2018 Referral Source:[employer] High Risk List Reason for Referral:Assess Needs Insurance:[employer] Medicare   Outreach Attempt:  Successful telephone outreach to patient for follow up.  HIPAA verified with patient.  Patient reporting she has received botox pelvic injections that has relieved her pelvic pains.  Continues to monitor blood sugars a couple of times a week with reporting fasting ranges of 90's and post prandial ranges of 140's.  Admits to not eating fruits and vegetables on normal basis.  Discussed importance of well balanced diet.  Latest Hgb A1C is increased to 7.6.   Encounter Medications:  Outpatient Encounter Medications as of 01/13/2020  Medication Sig Note  . amLODipine (NORVASC) 2.5 MG tablet Take 2.5 mg by mouth daily.   Marland Kitchen amoxicillin-clavulanate (AUGMENTIN) 875-125 MG tablet Take 1 tablet by mouth 2 (two) times daily.   Marland Kitchen ascorbic acid (VITAMIN C) 100 MG tablet Take by mouth.   Marland Kitchen aspirin EC 81 MG tablet Take 81 mg by mouth every evening.    Marland Kitchen buPROPion (WELLBUTRIN SR) 100 MG 12 hr tablet Take 1 tablet (100 mg total) by mouth daily.   . calcium-vitamin D (OSCAL WITH D) 500-200 MG-UNIT TABS tablet Take by mouth.   . cetirizine (ZYRTEC) 10 MG tablet Take 10 mg by mouth.   . cholecalciferol (VITAMIN D) 1000 units tablet Take 2,000 Units by mouth daily.   . DULoxetine (CYMBALTA) 20 MG capsule Take 1 capsule (20 mg total) by mouth daily.   . Evolocumab (REPATHA) 140 MG/ML SOSY Inject 140 mg into the skin every 14 (fourteen) days.   . Fluocinolone Acetonide Body 0.01 % OIL Apply to affected areas on scalp twice daily as needed for itching/redness   . fluticasone (FLONASE) 50 MCG/ACT nasal spray USE 1 SPRAY IN BOTH  NOSTRILS DAILY.   Marland Kitchen gabapentin (NEURONTIN) 300 MG capsule Take 300 mg  by mouth 3 (three) times daily.   Marland Kitchen levothyroxine (SYNTHROID) 50 MCG tablet Take 1 tablet (50 mcg total) by mouth daily.   Marland Kitchen linaclotide (LINZESS) 290 MCG CAPS capsule Take 1 capsule (290 mcg total) by mouth daily.   . medroxyPROGESTERone (PROVERA) 10 MG tablet Take by mouth.   Marland Kitchen omeprazole (PRILOSEC) 20 MG capsule Take 1 capsule (20 mg total) by mouth daily.   . ondansetron (ZOFRAN) 8 MG tablet every eight (8) hours as needed.   Marland Kitchen oxyCODONE (OXY IR/ROXICODONE) 5 MG immediate release tablet Take by mouth.   . ponatinib HCl (ICLUSIG) 15 MG tablet Take 15 mg by mouth daily.  12/09/2018: Reports taking daily  . predniSONE (DELTASONE) 5 MG tablet Take 5 mg by mouth daily with breakfast.   . Semaglutide, 1 MG/DOSE, (OZEMPIC, 1 MG/DOSE,) 2 MG/1.5ML SOPN Inject 1 mg into the skin once a week.   . spironolactone (ALDACTONE) 50 MG tablet daily.   Marland Kitchen tiZANidine (ZANAFLEX) 4 MG tablet 1-2 tablets PO  every 8 hours prn pain or muscle spasm   . traMADol (ULTRAM) 50 MG tablet Take 1 to 2 tablets po QD prn pain   . valACYclovir (VALTREX) 500 MG tablet Take by mouth.   . Accu-Chek FastClix Lancets MISC USE TO TEST BLOOD SUGAR  DAILY AS DIRECTED   . glucose blood (ACCU-CHEK GUIDE) test strip USE TO CHECK BLOOD GLUCOSE  EVERY DAY AS DIRECTED   . pimecrolimus (ELIDEL) 1 % cream Apply  topically 2 (two) times daily.   . rosuvastatin (CRESTOR) 5 MG tablet TAKE 1 TABLET BY MOUTH  DAILY WITH SUPPER    No facility-administered encounter medications on file as of 01/13/2020.    Functional Status:  In your present state of health, do you have any difficulty performing the following activities: 01/09/2020 09/08/2019  Hearing? N N  Vision? N N  Difficulty concentrating or making decisions? N N  Walking or climbing stairs? Y Y  Dressing or bathing? Y Y  Doing errands, shopping? Tempie Donning  Preparing Food and eating ? - Y  Using the Toilet? - N  In the past six months, have you accidently leaked urine? - Y  Do you have  problems with loss of bowel control? - N  Managing your Medications? - N  Managing your Finances? - N  Housekeeping or managing your Housekeeping? - Y  Some recent data might be hidden    Fall/Depression Screening: Fall Risk  01/13/2020 01/09/2020 10/13/2019  Falls in the past year? 0 0 0  Number falls in past yr: 0 - 0  Injury with Fall? 0 - 0  Risk for fall due to : Medication side effect No Fall Risks Impaired mobility;Impaired balance/gait;Medication side effect  Follow up Falls evaluation completed;Education provided;Falls prevention discussed Falls evaluation completed Falls evaluation completed;Education provided;Falls prevention discussed   PHQ 2/9 Scores 01/09/2020 10/13/2019 09/08/2019 06/09/2019 03/10/2019 12/06/2018 09/06/2018  PHQ - 2 Score 0 1 0 1 0 1 0  PHQ- 9 Score - - - - - - -    Goals Addressed              This Visit's Progress   .  [facility]) Eat Healthy        Follow Up Date 04/19/20   - limit fast food meals to no more than 1 per week - manage portion size - set a realistic goal -eat fruits at least 3 times a week -eat vegetables at least 3 times a week -Increase fiber in diet    Why is this important?   When you are ready to manage your nutrition or weight, having a plan and setting goals will help.  Taking small steps to change how you eat and exercise is a good place to start.    Notes:     .  [facility]) Learn More About My Health        Follow Up Date 04/19/20   - tell my story and reason for my visit - make a list of questions - ask questions - repeat what I heard to make sure I understand - bring a list of my medicines to the visit - speak up when I don't understand    Why is this important?   The best way to learn about your health and care is by talking to the doctor and nurse.  They will answer your questions and give you information in the way that you like best.    Notes:     .  [facility]) Monitor and Manage My Blood Sugar        Follow Up Date  07/20/20    - check blood sugar at prescribed times - check blood sugar if I feel it is too high or too low - enter blood sugar readings and medication or insulin into daily log - take the blood sugar log to all doctor visits - take the blood sugar meter to all doctor visits -notify provider for increasing blood  sugar ranges    Why is this important?   Checking your blood sugar at home helps to keep it from getting very high or very low.  Writing the results in a diary or log helps the doctor know how to care for you.  Your blood sugar log should have the time, date and the results.  Also, write down the amount of insulin or other medicine that you take.  Other information, like what you ate, exercise done and how you were feeling, will also be helpful.     Notes:     .  [facility]) Obtain Eye Exam        Follow Up Date 04/19/20   - schedule appointment with eye doctor -verify last eye exam with [place] and if current request results be sent to primary care privider    Why is this important?   Eye check-ups are important when you have diabetes.  Vision loss can be prevented.    Notes:     .  COMPLETED: [facility]) Patient will report maintaining Hgb A1C of 6 or below within the next 90 days. (pt-stated)        CARE PLAN ENTRY (see longtitudinal plan of care for additional care plan information)  Objective:  Lab Results  Component Value Date   HGBA1C 5.6 09/08/2019 .   Lab Results  Component Value Date   CREATININE 1.28 (H) 09/19/2018   CREATININE 1.33 (H) 09/09/2018   CREATININE 1.30 (H) 01/22/2018 .   Marland Kitchen No results found for: EGFR  Current Barriers:  Marland Kitchen Knowledge Deficits related to basic Diabetes pathophysiology and self care/management  Case Manager Clinical Goal(s):  Over the next 90 days, patient will demonstrate improved adherence to prescribed treatment plan for diabetes self care/management as evidenced by: maintaining Hgb A1C of 6 or below . Verbalize adherence to  ADA/ carb modified diet within the next 90 days . Verbalize adherence to prescribed medication regimen within the next 90 days   Interventions:  . Provided education to patient about basic DM disease process . Reviewed medications with patient and discussed importance of medication adherence . Discussed plans with patient for ongoing care management follow up and provided patient with direct contact information for care management team . Reviewed scheduled/upcoming provider appointments including: follow up appointments with primary care and gynecology . Advised patient, providing education and rationale, to check cbg at least 3 times a week or as provider prescribes and record, calling primary care provider for findings outside established parameters.    Patient Self Care Activities:  . Self administers injectable DM medication (Ozempic) as prescribed . Attends all scheduled provider appointments . Checks blood sugars as prescribed and utilize hyper and hypoglycemia protocol as needed . Adheres to prescribed ADA/carb modified . Continues to monitor blood pressure and notifying provider for sustained elevations  Initial goal documentation   Resolving due to duplicate goals      .  [facility]) Set My Target A1C        Follow Up Date 04/19/20   - set target A1C; goal is 7; current is 7.6    Why is this important?   Your target A1C is decided together by you and your doctor.  It is based on several things like your age and other health issues.    Notes:       Appointments:  Attended appointment with primary care provider, Leretha Pol NP on 01/09/2020.  Plan: RN Health Coach will make next telephone outreach to  patient within the month of February and patient agrees to future outreach. RN Health Coach will send primary care provider quarterly update.   [place] [phone] [email or web address]

## 2020-01-13 NOTE — Patient Instructions (Signed)
Goals Addressed              This Visit's Progress   .  [facility]) Eat Healthy        Follow Up Date 04/19/20   - limit fast food meals to no more than 1 per week - manage portion size - set a realistic goal -eat fruits at least 3 times a week -eat vegetables at least 3 times a week -Increase fiber in diet    Why is this important?   When you are ready to manage your nutrition or weight, having a plan and setting goals will help.  Taking small steps to change how you eat and exercise is a good place to start.    Notes:     .  [facility]) Learn More About My Health        Follow Up Date 04/19/20   - tell my story and reason for my visit - make a list of questions - ask questions - repeat what I heard to make sure I understand - bring a list of my medicines to the visit - speak up when I don't understand    Why is this important?   The best way to learn about your health and care is by talking to the doctor and nurse.  They will answer your questions and give you information in the way that you like best.    Notes:     .  [facility]) Monitor and Manage My Blood Sugar        Follow Up Date 07/20/20    - check blood sugar at prescribed times - check blood sugar if I feel it is too high or too low - enter blood sugar readings and medication or insulin into daily log - take the blood sugar log to all doctor visits - take the blood sugar meter to all doctor visits -notify provider for increasing blood sugar ranges    Why is this important?   Checking your blood sugar at home helps to keep it from getting very high or very low.  Writing the results in a diary or log helps the doctor know how to care for you.  Your blood sugar log should have the time, date and the results.  Also, write down the amount of insulin or other medicine that you take.  Other information, like what you ate, exercise done and how you were feeling, will also be helpful.     Notes:     .  [facility]) Obtain Eye Exam          Follow Up Date 04/19/20   - schedule appointment with eye doctor -verify last eye exam with [place] and if current request results be sent to primary care privider    Why is this important?   Eye check-ups are important when you have diabetes.  Vision loss can be prevented.    Notes:     .  COMPLETED: [facility]) Patient will report maintaining Hgb A1C of 6 or below within the next 90 days. (pt-stated)        CARE PLAN ENTRY (see longtitudinal plan of care for additional care plan information)  Objective:  Lab Results  Component Value Date   HGBA1C 5.6 09/08/2019 .   Lab Results  Component Value Date   CREATININE 1.28 (H) 09/19/2018   CREATININE 1.33 (H) 09/09/2018   CREATININE 1.30 (H) 01/22/2018 .   Marland Kitchen No results found for: EGFR  Current Barriers:  .  Knowledge Deficits related to basic Diabetes pathophysiology and self care/management  Case Manager Clinical Goal(s):  Over the next 90 days, patient will demonstrate improved adherence to prescribed treatment plan for diabetes self care/management as evidenced by: maintaining Hgb A1C of 6 or below . Verbalize adherence to ADA/ carb modified diet within the next 90 days . Verbalize adherence to prescribed medication regimen within the next 90 days   Interventions:  . Provided education to patient about basic DM disease process . Reviewed medications with patient and discussed importance of medication adherence . Discussed plans with patient for ongoing care management follow up and provided patient with direct contact information for care management team . Reviewed scheduled/upcoming provider appointments including: follow up appointments with primary care and gynecology . Advised patient, providing education and rationale, to check cbg at least 3 times a week or as provider prescribes and record, calling primary care provider for findings outside established parameters.    Patient Self Care Activities:  . Self administers  injectable DM medication (Ozempic) as prescribed . Attends all scheduled provider appointments . Checks blood sugars as prescribed and utilize hyper and hypoglycemia protocol as needed . Adheres to prescribed ADA/carb modified . Continues to monitor blood pressure and notifying provider for sustained elevations  Initial goal documentation   Resolving due to duplicate goals      .  [facility]) Set My Target A1C        Follow Up Date 04/19/20   - set target A1C; goal is 7; current is 7.6    Why is this important?   Your target A1C is decided together by you and your doctor.  It is based on several things like your age and other health issues.    Notes:

## 2020-01-16 ENCOUNTER — Encounter: Payer: Self-pay | Admitting: Nurse Practitioner

## 2020-01-17 ENCOUNTER — Other Ambulatory Visit: Payer: Self-pay | Admitting: Nurse Practitioner

## 2020-01-17 DIAGNOSIS — L209 Atopic dermatitis, unspecified: Secondary | ICD-10-CM

## 2020-01-17 MED ORDER — TACROLIMUS 0.03 % EX OINT: TOPICAL_OINTMENT | Freq: Two times a day (BID) | CUTANEOUS | 1 refills | Status: DC

## 2020-01-18 ENCOUNTER — Encounter: Payer: Self-pay | Admitting: Nurse Practitioner

## 2020-01-19 ENCOUNTER — Other Ambulatory Visit: Payer: Self-pay | Admitting: Nurse Practitioner

## 2020-01-19 DIAGNOSIS — L209 Atopic dermatitis, unspecified: Secondary | ICD-10-CM

## 2020-01-19 DIAGNOSIS — E782 Mixed hyperlipidemia: Secondary | ICD-10-CM

## 2020-01-19 DIAGNOSIS — N94819 Vulvodynia, unspecified: Principal | ICD-10-CM

## 2020-01-19 MED ORDER — REPATHA 140 MG/ML ~~LOC~~ SOSY: 140.0000 mg | PREFILLED_SYRINGE | SUBCUTANEOUS | 5 refills | Status: AC

## 2020-01-19 MED ORDER — TACROLIMUS 0.03 % EX OINT: TOPICAL_OINTMENT | Freq: Two times a day (BID) | CUTANEOUS | 1 refills | Status: DC

## 2020-01-19 MED ORDER — GABAPENTIN 300 MG CAPSULE
ORAL_CAPSULE | ORAL | 2 refills | 0.00000 days | Status: SS
Start: 2020-01-19 — End: ?

## 2020-01-20 ENCOUNTER — Encounter: Payer: Self-pay | Admitting: Nurse Practitioner

## 2020-01-21 ENCOUNTER — Encounter: Payer: Self-pay | Admitting: Internal Medicine

## 2020-01-22 DIAGNOSIS — L218 Other seborrheic dermatitis: Secondary | ICD-10-CM | POA: Diagnosis not present

## 2020-01-22 DIAGNOSIS — L821 Other seborrheic keratosis: Secondary | ICD-10-CM | POA: Diagnosis not present

## 2020-01-22 DIAGNOSIS — L309 Dermatitis, unspecified: Secondary | ICD-10-CM | POA: Diagnosis not present

## 2020-01-22 DIAGNOSIS — L2084 Intrinsic (allergic) eczema: Secondary | ICD-10-CM | POA: Diagnosis not present

## 2020-01-22 DIAGNOSIS — L304 Erythema intertrigo: Secondary | ICD-10-CM | POA: Diagnosis not present

## 2020-01-24 ENCOUNTER — Other Ambulatory Visit: Payer: Self-pay

## 2020-01-24 DIAGNOSIS — L209 Atopic dermatitis, unspecified: Secondary | ICD-10-CM

## 2020-01-24 MED ORDER — TACROLIMUS 0.03 % EX OINT: TOPICAL_OINTMENT | Freq: Two times a day (BID) | CUTANEOUS | 1 refills | Status: AC

## 2020-01-24 NOTE — Telephone Encounter (Signed)
PA approved on 01/24/2020 for TACROLIMUS OINTMENT thru 02/19/2021

## 2020-02-02 ENCOUNTER — Encounter: Payer: Self-pay | Admitting: Hospice and Palliative Medicine

## 2020-02-02 ENCOUNTER — Ambulatory Visit (INDEPENDENT_AMBULATORY_CARE_PROVIDER_SITE_OTHER): Payer: Medicare Other | Admitting: Hospice and Palliative Medicine

## 2020-02-02 ENCOUNTER — Other Ambulatory Visit: Payer: Self-pay

## 2020-02-02 VITALS — BP 129/75 | HR 102 | Temp 98.0°F | Resp 16 | Ht 64.0 in | Wt 240.6 lb

## 2020-02-02 DIAGNOSIS — M0579 Rheumatoid arthritis with rheumatoid factor of multiple sites without organ or systems involvement: Secondary | ICD-10-CM

## 2020-02-02 DIAGNOSIS — N390 Urinary tract infection, site not specified: Secondary | ICD-10-CM

## 2020-02-02 DIAGNOSIS — R300 Dysuria: Secondary | ICD-10-CM

## 2020-02-02 DIAGNOSIS — N952 Postmenopausal atrophic vaginitis: Secondary | ICD-10-CM | POA: Diagnosis not present

## 2020-02-02 DIAGNOSIS — B3731 Acute candidiasis of vulva and vagina: Secondary | ICD-10-CM

## 2020-02-02 DIAGNOSIS — B373 Candidiasis of vulva and vagina: Secondary | ICD-10-CM | POA: Diagnosis not present

## 2020-02-02 LAB — POCT URINALYSIS DIPSTICK
Bilirubin, UA: NEGATIVE
Blood, UA: NEGATIVE
Glucose, UA: NEGATIVE
Ketones, UA: NEGATIVE
Nitrite, UA: NEGATIVE
Protein, UA: POSITIVE — AB
Spec Grav, UA: 1.01 (ref 1.010–1.025)
Urobilinogen, UA: 0.2 E.U./dL
pH, UA: 6.5 (ref 5.0–8.0)

## 2020-02-02 MED ORDER — FLUCONAZOLE 150 MG PO TABS: ORAL_TABLET | ORAL | 0 refills | Status: AC

## 2020-02-02 MED ORDER — PEG 3350-ELECTROLYTES 236 GRAM-22.74 GRAM-6.74 GRAM-5.86 GRAM SOLUTION
Freq: Once | ORAL | 0 refills | 1.00000 days | Status: CP
Start: 2020-02-02 — End: 2020-02-03
  Filled 2020-02-03: qty 4000, 1d supply, fill #0

## 2020-02-02 MED ORDER — POLYETHYLENE GLYCOL 3350 17 GRAM/DOSE ORAL POWDER
ORAL | 0 refills | 0.00000 days | Status: CP
Start: 2020-02-02 — End: ?
  Filled 2020-02-03: qty 238, 7d supply, fill #0

## 2020-02-02 NOTE — Progress Notes (Signed)
[facility] [place], [place] [address]  Internal MEDICINE  Office Visit Note  Patient Name: Laura Chen  [birth year]  [id number]  Date of Service: 02/06/2020  Chief Complaint  Patient presents with  . Urinary Tract Infection     HPI Pt is here for a sick visit. She is complaining of increased urinary frequency as well as burning with urination, low back pain, vaginal itching and discharge Symptoms have been ongoing since Wednesday--not improving She has not taken any OTC medications for symptoms Has been tested multiple times in the last few months for UTI--culture negative, normal flora found At last visit in November, was treated with Augmentin for sinusitis--recent abx use   History of Lupus and RA-having an increased flare of pain today Initial elevated BP likely related to pain    Current Medication:  Outpatient Encounter Medications as of 02/02/2020  Medication Sig Note  . Accu-Chek FastClix Lancets MISC USE TO TEST BLOOD SUGAR  DAILY AS DIRECTED   . amLODipine (NORVASC) 2.5 MG tablet Take 2.5 mg by mouth daily.   Marland Kitchen ascorbic acid (VITAMIN C) 100 MG tablet Take by mouth.   Marland Kitchen aspirin EC 81 MG tablet Take 81 mg by mouth every evening.    Marland Kitchen buPROPion (WELLBUTRIN SR) 100 MG 12 hr tablet Take 1 tablet (100 mg total) by mouth daily.   . calcium-vitamin D (OSCAL WITH D) 500-200 MG-UNIT TABS tablet Take by mouth.   . cetirizine (ZYRTEC) 10 MG tablet Take 10 mg by mouth.   . cholecalciferol (VITAMIN D) 1000 units tablet Take 2,000 Units by mouth daily.   . DULoxetine (CYMBALTA) 20 MG capsule Take 1 capsule (20 mg total) by mouth daily.   . Evolocumab (REPATHA) 140 MG/ML SOSY Inject 140 mg into the skin every 14 (fourteen) days.   . Fluocinolone Acetonide Body 0.01 % OIL Apply to affected areas on scalp twice daily as needed for itching/redness   . fluticasone (FLONASE) 50 MCG/ACT nasal spray USE 1 SPRAY IN BOTH  NOSTRILS DAILY.   Marland Kitchen gabapentin  (NEURONTIN) 300 MG capsule Take 300 mg by mouth 3 (three) times daily.   Marland Kitchen glucose blood (ACCU-CHEK GUIDE) test strip USE TO CHECK BLOOD GLUCOSE  EVERY DAY AS DIRECTED   . levothyroxine (SYNTHROID) 50 MCG tablet Take 1 tablet (50 mcg total) by mouth daily.   Marland Kitchen linaclotide (LINZESS) 290 MCG CAPS capsule Take 1 capsule (290 mcg total) by mouth daily.   . [EXPIRED] medroxyPROGESTERone (PROVERA) 10 MG tablet Take by mouth.   Marland Kitchen omeprazole (PRILOSEC) 20 MG capsule Take 1 capsule (20 mg total) by mouth daily.   . ondansetron (ZOFRAN) 8 MG tablet every eight (8) hours as needed.   Marland Kitchen oxyCODONE (OXY IR/ROXICODONE) 5 MG immediate release tablet Take by mouth.   . ponatinib HCl (ICLUSIG) 15 MG tablet Take 15 mg by mouth daily.  12/09/2018: Reports taking daily  . predniSONE (DELTASONE) 5 MG tablet Take 5 mg by mouth daily with breakfast.   . Semaglutide, 1 MG/DOSE, (OZEMPIC, 1 MG/DOSE,) 2 MG/1.5ML SOPN Inject 1 mg into the skin once a week.   . spironolactone (ALDACTONE) 50 MG tablet daily.   . tacrolimus (PROTOPIC) 0.03 % ointment Apply topically 2 (two) times daily.   Marland Kitchen tiZANidine (ZANAFLEX) 4 MG tablet 1-2 tablets PO  every 8 hours prn pain or muscle spasm   . traMADol (ULTRAM) 50 MG tablet Take 1 to 2 tablets po QD prn pain   . valACYclovir (  VALTREX) 500 MG tablet Take by mouth.   . [DISCONTINUED] amoxicillin-clavulanate (AUGMENTIN) 875-125 MG tablet Take 1 tablet by mouth 2 (two) times daily.   . fluconazole (DIFLUCAN) 150 MG tablet Take one tablet daily every 3 days until symptoms resolved.   . [DISCONTINUED] rosuvastatin (CRESTOR) 5 MG tablet TAKE 1 TABLET BY MOUTH  DAILY WITH SUPPER 02/04/2020: un   No facility-administered encounter medications on file as of 02/02/2020.      Medical History: Past Medical History:  Diagnosis Date  . Asthma   . CML (chronic myeloid leukemia) ([place]) 2011   (Dr. Alyse Low) stopped gleevec 2/2 side effects  . Diabetes mellitus without complication ([place])    . GERD (gastroesophageal reflux disease)   . History of shingles   . History of syphilis 1990   s/p treatment  . Hypertension   . Osteopenia    due to prednisone use, was on reclast  . Psoriasis of vulva   . PUD (peptic ulcer disease)   . Rheumatoid arthritis(714.0) 1990s   Jacoud's arthropathy, previously treated with MTX, TNFa (remicade, enbrel, humira, orencia, rituximab, and actemra)  . SLE (systemic lupus erythematosus) ([place]) 09/2009   Rheum-Dr. Joan Mayans  . Thyroid goiter    Endo-Dr. Eddie Dibbles     Vital Signs: BP 129/75   Pulse (!) 102   Temp 98 F (36.7 C)   Resp 16   Ht 5\' 4"  (1.626 m)   Wt 240 lb 9.6 oz (109.1 kg)   SpO2 99%   BMI 41.30 kg/m    Review of Systems  Constitutional: Negative for chills, diaphoresis and fatigue.  HENT: Negative for ear pain, postnasal drip and sinus pressure.   Eyes: Negative for photophobia, discharge, redness, itching and visual disturbance.  Respiratory: Negative for cough, shortness of breath and wheezing.   Cardiovascular: Negative for chest pain, palpitations and leg swelling.  Gastrointestinal: Negative for abdominal pain, constipation, diarrhea, nausea and vomiting.  Genitourinary: Positive for dysuria, frequency and urgency. Negative for flank pain.  Musculoskeletal: Positive for arthralgias, gait problem and myalgias. Negative for back pain and neck pain.  Skin: Negative for color change.  Allergic/Immunologic: Negative for environmental allergies and food allergies.  Neurological: Negative for dizziness and headaches.  Hematological: Does not bruise/bleed easily.  Psychiatric/Behavioral: Negative for agitation, behavioral problems (depression) and hallucinations.    Physical Exam Vitals reviewed.  Constitutional:      Appearance: Normal appearance. She is obese.  Cardiovascular:     Rate and Rhythm: Normal rate and regular rhythm.     Pulses: Normal pulses.     Heart sounds: Normal heart sounds.  Pulmonary:      Effort: Pulmonary effort is normal.     Breath sounds: Normal breath sounds.  Abdominal:     General: Abdomen is flat.  Musculoskeletal:     Comments: Appears at baseline--using walker for ambulation assistance  Skin:    General: Skin is warm.  Neurological:     General: No focal deficit present.     Mental Status: She is alert and oriented to person, place, and time. Mental status is at baseline.  Psychiatric:        Mood and Affect: Mood normal.        Behavior: Behavior normal.        Thought Content: Thought content normal.        Judgment: Judgment normal.     Assessment/Plan: 1. Urinary tract infection without hematuria, site unspecified Will await culture results to treat appropriately Recent  use of antibiotic therapy--multiple visits for similar symptoms with normal culture - CULTURE, URINE COMPREHENSIVE  2. Atrophic vaginitis May need to consider treatment with low dose estrogen cream as symptoms may be due to atrophic vaginitis, will await urine culture results  3. Rheumatoid arthritis involving multiple sites with positive rheumatoid factor ([place]) Followed closely by rheumatologist, continue with current management and appropriate follow-up  4. Vaginal candidiasis May start fluconazole therapy for symptoms consistent with candidiasis - fluconazole (DIFLUCAN) 150 MG tablet; Take one tablet daily every 3 days until symptoms resolved.  Dispense: 3 tablet; Refill: 0  5. Dysuria - POCT Urinalysis Dipstick  General Counseling: Alpa verbalizes understanding of the findings of todays visit and agrees with plan of treatment. I have discussed any further diagnostic evaluation that may be needed or ordered today. We also reviewed her medications today. she has been encouraged to call the office with any questions or concerns that should arise related to todays visit.   Orders Placed This Encounter  Procedures  . CULTURE, URINE COMPREHENSIVE  . POCT Urinalysis Dipstick     Meds ordered this encounter  Medications  . fluconazole (DIFLUCAN) 150 MG tablet    Sig: Take one tablet daily every 3 days until symptoms resolved.    Dispense:  3 tablet    Refill:  0    Time spent: 30 Minutes Time spent includes review of chart, medications, test results and follow-up plan with the patient.  This patient was seen by Theodoro Grist AGNP-C in Collaboration with Dr Lavera Guise as a part of collaborative care agreement.  Tanna Furry [place] [facility]

## 2020-02-03 MED FILL — POLYETHYLENE GLYCOL 3350 17 GRAM/DOSE ORAL POWDER: 7 days supply | Qty: 238 | Fill #0 | Status: AC

## 2020-02-03 MED FILL — PEG 3350-ELECTROLYTES 236 GRAM-22.74 GRAM-6.74 GRAM-5.86 GRAM SOLUTION: 1 days supply | Qty: 4000 | Fill #0 | Status: AC

## 2020-02-04 DIAGNOSIS — J300 Vasomotor rhinitis: Secondary | ICD-10-CM | POA: Insufficient documentation

## 2020-02-04 DIAGNOSIS — L209 Atopic dermatitis, unspecified: Secondary | ICD-10-CM | POA: Insufficient documentation

## 2020-02-04 DIAGNOSIS — Z23 Encounter for immunization: Secondary | ICD-10-CM | POA: Insufficient documentation

## 2020-02-04 DIAGNOSIS — J019 Acute sinusitis, unspecified: Secondary | ICD-10-CM | POA: Insufficient documentation

## 2020-02-04 MED ORDER — PEG3350 100 GRAM-SOD SULF 7.5 GRAM-NACL-KCL-ASCORBATE-C ORAL PWDR PACK
Freq: Once | ORAL | 0 refills | 1.00000 days | Status: CP
Start: 2020-02-04 — End: 2020-02-05

## 2020-02-05 ENCOUNTER — Encounter: Payer: Self-pay | Admitting: Hospice and Palliative Medicine

## 2020-02-06 ENCOUNTER — Other Ambulatory Visit: Payer: Self-pay

## 2020-02-06 ENCOUNTER — Encounter: Payer: Self-pay | Admitting: Hospice and Palliative Medicine

## 2020-02-06 MED ORDER — ESTROGENS, CONJUGATED 0.625 MG/GM VA CREA: TOPICAL_CREAM | VAGINAL | 0 refills | Status: AC

## 2020-02-10 ENCOUNTER — Encounter: Payer: Self-pay | Admitting: Hospice and Palliative Medicine

## 2020-02-10 LAB — CULTURE, URINE COMPREHENSIVE

## 2020-02-11 DIAGNOSIS — M059 Rheumatoid arthritis with rheumatoid factor, unspecified: Principal | ICD-10-CM

## 2020-02-11 MED ORDER — OXYCODONE 5 MG TABLET
ORAL_TABLET | Freq: Three times a day (TID) | ORAL | 0 refills | 30 days | Status: CP | PRN
Start: 2020-02-11 — End: 2020-03-15

## 2020-02-17 DIAGNOSIS — N898 Other specified noninflammatory disorders of vagina: Principal | ICD-10-CM

## 2020-02-17 MED ORDER — HYDROCORTISONE ACETATE 25 MG RECTAL SUPPOSITORY
TOPICAL | 1 refills | 0.00000 days | Status: CP
Start: 2020-02-17 — End: 2020-02-23

## 2020-02-20 DIAGNOSIS — N898 Other specified noninflammatory disorders of vagina: Principal | ICD-10-CM

## 2020-02-23 MED ORDER — HYDROCORTISONE ACETATE 25 MG RECTAL SUPPOSITORY
TOPICAL | 0 refills | 0.00000 days | Status: SS
Start: 2020-02-23 — End: 2020-02-23

## 2020-02-23 MED ORDER — HYDROCORTISONE ACETATE 25 MG RECTAL SUPPOSITORY: suppository | 0 refills | 0 days | Status: AC

## 2020-02-24 ENCOUNTER — Telehealth: Payer: Self-pay

## 2020-02-24 MED ORDER — SPIRONOLACTONE 50 MG TABLET
ORAL_TABLET | Freq: Every day | ORAL | 1 refills | 90.00000 days | Status: CP
Start: 2020-02-24 — End: ?

## 2020-02-24 NOTE — Telephone Encounter (Signed)
[employer] SureClick 140MG /ML auto- injectorsapproved 02/21/20 to 02/19/21 Auth# 02/21/21

## 2020-02-25 DIAGNOSIS — N898 Other specified noninflammatory disorders of vagina: Principal | ICD-10-CM

## 2020-02-27 MED ORDER — CLINDAMYCIN 2 % VAGINAL CREAM
Freq: Every evening | VAGINAL | 1 refills | 8.00000 days | Status: SS
Start: 2020-02-27 — End: ?

## 2020-03-01 ENCOUNTER — Other Ambulatory Visit: Payer: Self-pay

## 2020-03-01 DIAGNOSIS — E1165 Type 2 diabetes mellitus with hyperglycemia: Secondary | ICD-10-CM

## 2020-03-01 MED ORDER — ACCU-CHEK GUIDE VI STRP: ORAL_STRIP | 1 refills | Status: AC

## 2020-03-07 ENCOUNTER — Other Ambulatory Visit: Payer: Self-pay

## 2020-03-07 ENCOUNTER — Emergency Department
Admission: EM | Admit: 2020-03-07 | Discharge: 2020-03-07 | Disposition: A | Discharge status: Home / Self Care | Payer: Medicare Other | Attending: Emergency Medicine | Admitting: Emergency Medicine

## 2020-03-07 DIAGNOSIS — Z79899 Other long term (current) drug therapy: Secondary | ICD-10-CM | POA: Insufficient documentation

## 2020-03-07 DIAGNOSIS — Z87891 Personal history of nicotine dependence: Secondary | ICD-10-CM | POA: Diagnosis not present

## 2020-03-07 DIAGNOSIS — I10 Essential (primary) hypertension: Secondary | ICD-10-CM | POA: Insufficient documentation

## 2020-03-07 DIAGNOSIS — Z743 Need for continuous supervision: Secondary | ICD-10-CM | POA: Diagnosis not present

## 2020-03-07 DIAGNOSIS — R112 Nausea with vomiting, unspecified: Secondary | ICD-10-CM

## 2020-03-07 DIAGNOSIS — Z6841 Body Mass Index (BMI) 40.0 and over, adult: Secondary | ICD-10-CM | POA: Insufficient documentation

## 2020-03-07 DIAGNOSIS — C9211 Chronic myeloid leukemia, BCR/ABL-positive, in remission: Secondary | ICD-10-CM | POA: Diagnosis not present

## 2020-03-07 DIAGNOSIS — N179 Acute kidney failure, unspecified: Secondary | ICD-10-CM | POA: Insufficient documentation

## 2020-03-07 DIAGNOSIS — F339 Major depressive disorder, recurrent, unspecified: Secondary | ICD-10-CM | POA: Diagnosis not present

## 2020-03-07 DIAGNOSIS — E119 Type 2 diabetes mellitus without complications: Secondary | ICD-10-CM | POA: Insufficient documentation

## 2020-03-07 DIAGNOSIS — R531 Weakness: Secondary | ICD-10-CM | POA: Diagnosis not present

## 2020-03-07 DIAGNOSIS — R000 Tachycardia, unspecified: Secondary | ICD-10-CM | POA: Diagnosis not present

## 2020-03-07 DIAGNOSIS — E669 Obesity, unspecified: Secondary | ICD-10-CM | POA: Diagnosis not present

## 2020-03-07 DIAGNOSIS — J45909 Unspecified asthma, uncomplicated: Secondary | ICD-10-CM | POA: Insufficient documentation

## 2020-03-07 DIAGNOSIS — U071 COVID-19: Secondary | ICD-10-CM | POA: Diagnosis not present

## 2020-03-07 LAB — BASIC METABOLIC PANEL
Anion gap: 12 (ref 5–15)
BUN: 10 mg/dL (ref 6–20)
CO2: 19 mmol/L — ABNORMAL LOW (ref 22–32)
Calcium: 9.2 mg/dL (ref 8.9–10.3)
Chloride: 107 mmol/L (ref 98–111)
Creatinine, Ser: 1.44 mg/dL — ABNORMAL HIGH (ref 0.44–1.00)
GFR, Estimated: 44 mL/min — ABNORMAL LOW (ref >60)
Glucose, Bld: 123 mg/dL — ABNORMAL HIGH (ref 70–99)
Potassium: 4.4 mmol/L (ref 3.5–5.1)
Sodium: 138 mmol/L (ref 135–145)

## 2020-03-07 LAB — URINALYSIS, COMPLETE (UACMP) WITH MICROSCOPIC
Bilirubin Urine: NEGATIVE
Glucose, UA: NEGATIVE mg/dL
Ketones, ur: 5 mg/dL — AB
Nitrite: NEGATIVE
Protein, ur: 30 mg/dL — AB
RBC / HPF: >50 RBC/hpf — ABNORMAL HIGH (ref 0–5)
Specific Gravity, Urine: 1.024 (ref 1.005–1.030)
Squamous Epithelial / HPF: >50 — ABNORMAL HIGH (ref 0–5)
WBC, UA: >50 WBC/hpf — ABNORMAL HIGH (ref 0–5)
pH: 5 (ref 5.0–8.0)

## 2020-03-07 LAB — CBC
HCT: 54.1 % — ABNORMAL HIGH (ref 36.0–46.0)
Hemoglobin: 17.1 g/dL — ABNORMAL HIGH (ref 12.0–15.0)
MCH: 26.8 pg (ref 26.0–34.0)
MCHC: 31.6 g/dL (ref 30.0–36.0)
MCV: 84.9 fL (ref 80.0–100.0)
Platelets: 219 10*3/uL (ref 150–400)
RBC: 6.37 MIL/uL — ABNORMAL HIGH (ref 3.87–5.11)
RDW: 18.8 % — ABNORMAL HIGH (ref 11.5–15.5)
WBC: 5.7 10*3/uL (ref 4.0–10.5)
nRBC: 0 % (ref 0.0–0.2)

## 2020-03-07 LAB — POC URINE PREG, ED: Preg Test, Ur: NEGATIVE

## 2020-03-07 MED ORDER — SODIUM CHLORIDE 0.9 % IV BOLUS
1000.0000 mL | Freq: Once | INTRAVENOUS | Status: AC
  Administered 2020-03-07: 1000 mL via INTRAVENOUS

## 2020-03-07 MED ORDER — PROMETHAZINE HCL 25 MG/ML IJ SOLN
25.0000 mg | Freq: Once | INTRAMUSCULAR | Status: AC
  Administered 2020-03-07: 25 mg via INTRAVENOUS
  Filled 2020-03-07: qty 1

## 2020-03-07 MED ORDER — OXYCODONE HCL 5 MG PO TABS
5.0000 mg | ORAL_TABLET | Freq: Once | ORAL | Status: AC
  Administered 2020-03-07: 5 mg via ORAL
  Filled 2020-03-07: qty 1

## 2020-03-07 MED ORDER — PROMETHAZINE HCL 12.5 MG PO TABS: 12.5000 mg | ORAL_TABLET | Freq: Four times a day (QID) | ORAL | 0 refills | Status: DC | PRN

## 2020-03-07 NOTE — ED Notes (Signed)
Pt states that nausea is better.

## 2020-03-07 NOTE — ED Triage Notes (Signed)
Pt from home  Hx of CML. Pt with N/V x several days. Pt denies diarrhea. Pt alert, c/o chills at times and dizzy.

## 2020-03-07 NOTE — ED Provider Notes (Signed)
[facility] Emergency Department Provider Note ____________________________________________   Event Date/Time   First MD Initiated Contact with Patient 03/07/20 1351     (approximate)  I have reviewed the triage vital signs and the nursing notes.   HISTORY  Chief Complaint Emesis  HPI Laura Chen is a 55 y.o. female with history of CML presents to the emergency department for treatment and evaluation of nausea and vomiting without diarrhea.  Symptoms today are similar to previous symptoms.  She is also complaining of weakness.  No relief at home with Zofran.  She denies fever.         Past Medical History:  Diagnosis Date  . Asthma   . CML (chronic myeloid leukemia) ([place]) 2011   (Dr. Alyse Low) stopped gleevec 2/2 side effects  . Diabetes mellitus without complication ([place])   . GERD (gastroesophageal reflux disease)   . History of shingles   . History of syphilis 1990   s/p treatment  . Hypertension   . Osteopenia    due to prednisone use, was on reclast  . Psoriasis of vulva   . PUD (peptic ulcer disease)   . Rheumatoid arthritis(714.0) 1990s   Jacoud's arthropathy, previously treated with MTX, TNFa (remicade, enbrel, humira, orencia, rituximab, and actemra)  . SLE (systemic lupus erythematosus) ([place]) 09/2009   Rheum-Dr. Joan Mayans  . Thyroid goiter    Endo-Dr. Eddie Dibbles    Patient Active Problem List   Diagnosis Date Noted  . Acute non-recurrent sinusitis 02/04/2020  . Vasomotor rhinitis 02/04/2020  . Atopic dermatitis 02/04/2020  . Flu vaccine need 02/04/2020  . Encounter for general adult medical examination with abnormal findings 10/01/2019  . Chronic vaginitis 10/01/2019  . Routine cervical smear 10/01/2019  . BMI 40.0-44.9, adult ([place]) 06/09/2019  . Diet-controlled type 2 diabetes mellitus ([place]) 12/18/2018  . Acute cystitis with hematuria 12/18/2018  . Vaginal discharge 11/18/2018  . Screening for breast cancer 09/15/2018   . Acquired hypothyroidism 09/15/2018  . Vitamin D deficiency 09/15/2018  . 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 Iron deficiency anemia 06/16/2018  . Acute vaginitis 02/24/2018  . Hematuria 01/23/2018  . Bacterial vaginitis 01/23/2018  . Type 2 diabetes mellitus without complication, without long-term current use of insulin ([place]) 12/16/2017  . Mixed hyperlipidemia 12/16/2017  . Depression, major, recurrent, moderate ([place]) 12/16/2017  . Urinary tract infection without hematuria 11/11/2017  . Dysuria 11/11/2017  . Vaginal candidiasis 11/11/2017  . Episode of moderate major depression ([place]) 11/11/2017  . Elevated blood pressure reading in office with diagnosis of hypertension 08/14/2017  . Hypokalemia 08/14/2017  . Sepsis ([place]) 06/28/2017  . Acute on chronic kidney failure ([place]) 04/14/2017  . (HFpEF) heart failure with preserved ejection fraction ([place]) 12/12/2016  . Other chest pain 12/12/2016  . Pneumonia 04/30/2015  . Acute renal failure (ARF) ([place]) 04/24/2015  . Elevated LFTs 04/24/2015  . Hyponatremia 04/24/2015  . Influenza A 04/24/2015  . Oral candidiasis 04/24/2015  . Pulmonary nodule 08/26/2014  . Iritis 04/30/2013  . Steroid responder, bilateral 04/30/2013  . Hemoptysis 11/02/2012  . Irritable bowel syndrome with constipation 05/08/2012  . Complete rupture of rotator cuff 09/19/2011  . 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 Effusion of wrist 11/22/2010  . Hand joint pain 11/22/2010  . Polymenorrhea 10/28/2010  . Anemia 10/28/2010  . External otitis 10/27/2010  . Endometrial polyp 10/27/2010  . Menorrhagia 10/27/2010  . Right flank pain 10/12/2010  . Fatigue 06/23/2010  . Skin rash 06/07/2010  .  Edema 06/07/2010  . Tobacco abuse 06/07/2010  . Tobacco dependence syndrome 06/07/2010  . Primary localized osteoarthrosis, lower leg 05/11/2010  . Chronic myeloid leukemia ([place]) 03/24/2010  . ASTHMA 03/24/2010  . GERD 03/24/2010  . SYSTEMIC LUPUS  ERYTHEMATOSUS 03/24/2010  . Rheumatoid arthritis ([place]) 03/24/2010  . FLATULENCE ERUCTATION AND GAS PAIN 03/24/2010  . Asthma 03/24/2010    Past Surgical History:  Procedure Laterality Date  . BOTOX INJECTION     in pelvic muscles  . DEXA  2010   osteopenia  . MYOMECTOMY  2008  . right ankle AFO  2011  . TRANSTHORACIC ECHOCARDIOGRAM  06/3662   nl systolic/diastolic fxn, EF 40%, mild MR, normal PA pressures  . US/HSG  05/2010   possible endometrial polyp, rec rpt 8 wks continue loestrin 24 [facility])    Prior to Admission medications   Medication Sig Start Date End Date Taking? Authorizing Provider  Accu-Chek FastClix Lancets MISC USE TO TEST BLOOD SUGAR  DAILY AS DIRECTED 10/06/19   Lavera Guise, MD  albuterol (VENTOLIN HFA) 108 (90 Base) MCG/ACT inhaler Inhale 2 puffs into the lungs every 6 (six) hours as needed for wheezing or shortness of breath. 03/13/20   Naaman Plummer, MD  amLODipine (NORVASC) 2.5 MG tablet Take 2.5 mg by mouth daily. 08/26/19   [provider]  ascorbic acid (VITAMIN C) 100 MG tablet Take by mouth.    [provider]  aspirin EC 81 MG tablet Take 81 mg by mouth every evening.     [provider]  buPROPion (WELLBUTRIN SR) 100 MG 12 hr tablet Take 1 tablet (100 mg total) by mouth daily. 01/09/20   Ronnell Freshwater, NP  calcium-vitamin D (OSCAL WITH D) 500-200 MG-UNIT TABS tablet Take by mouth.    [provider]  cetirizine (ZYRTEC) 10 MG tablet Take 10 mg by mouth.    [provider]  cholecalciferol (VITAMIN D) 1000 units tablet Take 2,000 Units by mouth daily.    [provider]  conjugated estrogens (PREMARIN) vaginal cream Apply pea size amount out side the vaginal area daily for 2 weeks 02/06/20   Luiz Ochoa, NP  doxycycline (VIBRAMYCIN) 50 MG capsule Take 2 capsules (100 mg total) by mouth 2 (two) times daily for 5 days. 03/13/20 03/18/20  Naaman Plummer, MD  DULoxetine (CYMBALTA) 20 MG capsule  Take 1 capsule (20 mg total) by mouth daily. 01/09/20   Ronnell Freshwater, NP  Evolocumab (REPATHA) 140 MG/ML SOSY Inject 140 mg into the skin every 14 (fourteen) days. 01/19/20   Ronnell Freshwater, NP  fluconazole (DIFLUCAN) 150 MG tablet Take one tablet daily every 3 days until symptoms resolved. 02/02/20   Luiz Ochoa, NP  Fluocinolone Acetonide Body 0.01 % OIL Apply to affected areas on scalp twice daily as needed for itching/redness 01/24/19   [provider]  fluticasone (FLONASE) 50 MCG/ACT nasal spray USE 1 SPRAY IN BOTH  NOSTRILS DAILY. 01/09/20   Ronnell Freshwater, NP  gabapentin (NEURONTIN) 300 MG capsule Take 300 mg by mouth 3 (three) times daily. 12/27/19   [provider]  glucose blood (ACCU-CHEK GUIDE) test strip USE TO CHECK BLOOD GLUCOSE  EVERY DAY AS DIRECTED 03/01/20   Ronnell Freshwater, NP  levothyroxine (SYNTHROID) 50 MCG tablet Take 1 tablet (50 mcg total) by mouth daily. 06/16/19   Ronnell Freshwater, NP  linaclotide (LINZESS) 290 MCG CAPS capsule Take 1 capsule (290 mcg total) by mouth daily. 01/09/20  Ronnell Freshwater, NP  methylPREDNISolone (MEDROL DOSEPAK) 4 MG TBPK tablet Follow instructions in package 03/13/20   Naaman Plummer, MD  omeprazole (PRILOSEC) 20 MG capsule Take 1 capsule (20 mg total) by mouth daily. 01/09/20   Ronnell Freshwater, NP  ondansetron (ZOFRAN) 8 MG tablet every eight (8) hours as needed. 07/24/17   [provider]  ponatinib HCl (ICLUSIG) 15 MG tablet Take 15 mg by mouth daily.     [provider]  predniSONE (DELTASONE) 5 MG tablet Take 5 mg by mouth daily with breakfast.    [provider]  promethazine (PHENERGAN) 25 MG suppository Place 1 suppository (25 mg total) rectally every 6 (six) hours as needed for nausea. 03/13/20   Carrie Mew, MD  Semaglutide, 1 MG/DOSE, (OZEMPIC, 1 MG/DOSE,) 2 MG/1.5ML SOPN Inject 1 mg into the skin once a week. 01/09/20   Ronnell Freshwater, NP  spironolactone  (ALDACTONE) 50 MG tablet daily. 07/13/18   [provider]  tacrolimus (PROTOPIC) 0.03 % ointment Apply topically 2 (two) times daily. 01/24/20   Ronnell Freshwater, NP  tiZANidine (ZANAFLEX) 4 MG tablet 1-2 tablets PO  every 8 hours prn pain or muscle spasm 07/29/19   [provider]  traMADol (ULTRAM) 50 MG tablet Take 1 to 2 tablets po QD prn pain 01/09/20   Ronnell Freshwater, NP  valACYclovir (VALTREX) 500 MG tablet Take by mouth.    [provider]    Allergies Furosemide, Ibuprofen, Sulfa antibiotics, Tape, and Thiazide-type diuretics  Family History  Problem Relation Age of Onset  . Heart attack Maternal Grandmother   . Coronary artery disease Maternal Grandmother   . Hypertension Mother   . Hypertension Other        Aunt    Social History Social History   Tobacco Use  . Smoking status: Former Smoker    Packs/day: 0.30    Types: Cigarettes  . Smokeless tobacco: Never Used  . Tobacco comment: 6 cigarettes/day  Vaping Use  . Vaping Use: Never used  Substance Use Topics  . Alcohol use: No  . Drug use: No    Review of Systems  Constitutional: No fever/chills. Positive for generalized weakness. Eyes: No visual changes. ENT: No sore throat. Cardiovascular: Denies chest pain. Respiratory: Denies shortness of breath. Gastrointestinal: No abdominal pain.  Positive for nausea, positive for vomiting. Positive for diarrhea.  No constipation. Genitourinary: Negative for dysuria. Musculoskeletal: Negative for back pain. Skin: Negative for rash. Neurological: Negative for headaches, focal weakness or numbness. ____________________________________________   PHYSICAL EXAM:  VITAL SIGNS: ED Triage Vitals  Enc Vitals Group     BP 03/07/20 1340 105/76     Pulse Rate 03/07/20 1340 (!) 119     Resp 03/07/20 1340 20     Temp 03/07/20 1340 98.8 F (37.1 C)     Temp Source 03/07/20 1340 Oral     SpO2 03/07/20 1340 100 %     Weight 03/07/20 1341 238 lb  (108 kg)     Height 03/07/20 1341 5\' 4"  (1.626 m)     Head Circumference --      Peak Flow --      Pain Score 03/07/20 1340 3     Pain Loc --      Pain Edu? --      Excl. in [place]? --     Constitutional: Alert and oriented. Well appearing and in no acute distress. Eyes: Conjunctivae are normal. PERRL. EOMI. Head: Atraumatic. Nose:  No congestion/rhinnorhea. Mouth/Throat: Mucous membranes are moist.  Oropharynx non-erythematous. Neck: No stridor.   Hematological/Lymphatic/Immunilogical: No cervical lymphadenopathy. Cardiovascular: Normal rate, regular rhythm. Grossly normal heart sounds.  Good peripheral circulation. Respiratory: Normal respiratory effort.  No retractions. Lungs CTAB. Gastrointestinal: Soft and nontender. No distention. No abdominal bruits. No CVA tenderness. Genitourinary:  Musculoskeletal: No lower extremity tenderness nor edema.  No joint effusions. Neurologic:  Normal speech and language. No gross focal neurologic deficits are appreciated. No gait instability. Skin:  Skin is warm, dry and intact. No rash noted. Psychiatric: Mood and affect are normal. Speech and behavior are normal.  ____________________________________________   LABS (all labs ordered are listed, but only abnormal results are displayed)  Labs Reviewed  SARS CORONAVIRUS 2 (TAT 6-24 HRS) - Abnormal; Notable for the following components:      Result Value   SARS Coronavirus 2 POSITIVE (*)    All other components within normal limits  BASIC METABOLIC PANEL - Abnormal; Notable for the following components:   CO2 19 (*)    Glucose, Bld 123 (*)    Creatinine, Ser 1.44 (*)    GFR, Estimated 44 (*)    All other components within normal limits  CBC - Abnormal; Notable for the following components:   RBC 6.37 (*)    Hemoglobin 17.1 (*)    HCT 54.1 (*)    RDW 18.8 (*)    All other components within normal limits  URINALYSIS, COMPLETE (UACMP) WITH MICROSCOPIC - Abnormal; Notable for the following  components:   Color, Urine AMBER (*)    APPearance CLOUDY (*)    Hgb urine dipstick LARGE (*)    Ketones, ur 5 (*)    Protein, ur 30 (*)    Leukocytes,Ua MODERATE (*)    RBC / HPF >50 (*)    WBC, UA >50 (*)    Bacteria, UA FEW (*)    Squamous Epithelial / LPF >50 (*)    All other components within normal limits  POC URINE PREG, ED - Normal   ____________________________________________  EKG  ED ECG REPORT I, Jeremey Bascom, FNP-BC personally viewed and interpreted this ECG.   EKG Time: 1335  Rate: 117  Rhythm: sinus tachycardia  Axis: normal  Intervals:none  ST&T Change: none  ____________________________________________  RADIOLOGY  ED MD interpretation:      I, Sherrie George, personally viewed and evaluated these images (plain radiographs) as part of my medical decision making, as well as reviewing the written report by the radiologist.  Official radiology report(s): No results found.  ____________________________________________   PROCEDURES  Procedure(s) performed (including Critical Care):  Procedures  ____________________________________________   INITIAL IMPRESSION / ASSESSMENT AND PLAN   55 year old female presents to the emergency department for evaluation of symptoms as described in the HPI.  Plan will be to give her some fluids for rehydration and give her Phenergan since Zofran has not helped her.  ED COURSE  Labs are overall reassuring.  Urinalysis does show greater than 50 white blood cells with few bacteria and moderate leukocytes, however there is also significant number of squamous epithelial cells.  Patient denies any frequency or dysuria.  While here in the emergency department, she has had no further vomiting.  She has been able to get up and ambulate to the restroom with her walker.  Plan will be to give her a prescription for Phenergan and have her follow-up with her primary care provider for symptoms that are not improving over the  next  few days.    ___________________________________________   FINAL CLINICAL IMPRESSION(S) / ED DIAGNOSES  Final diagnoses:  Non-intractable vomiting with nausea, unspecified vomiting type     ED Discharge Orders         Ordered    promethazine (PHENERGAN) 12.5 MG tablet  Every 6 hours PRN,   Status:  Discontinued        03/07/20 [place] Narasimhan was evaluated in Emergency Department on 03/15/2020 for the symptoms described in the history of present illness. She was evaluated in the context of the global COVID-19 pandemic, which necessitated consideration that the patient might be at risk for infection with the SARS-CoV-2 virus that causes COVID-19. Institutional protocols and algorithms that pertain to the evaluation of patients at risk for COVID-19 are in a state of rapid change based on information released by regulatory bodies including the CDC and federal and state organizations. These policies and algorithms were followed during the patient's care in the ED.   Note:  This document was prepared using Dragon voice recognition software and may include unintentional dictation errors.   Victorino Dike, FNP 03/15/20 2009    Vanessa , MD 03/15/20 2021

## 2020-03-07 NOTE — Discharge Instructions (Addendum)
Please follow up with your GI doctor.  Return to the ER for symptoms that change or worsen or for new concerns.

## 2020-03-08 LAB — SARS CORONAVIRUS 2 (TAT 6-24 HRS): SARS Coronavirus 2: POSITIVE — AB

## 2020-03-09 ENCOUNTER — Telehealth: Payer: Self-pay | Admitting: Family

## 2020-03-09 NOTE — Telephone Encounter (Signed)
Called to discuss with patient about COVID-19 symptoms and the use of one of the available treatments for those with mild to moderate Covid symptoms and at a high risk of hospitalization.  Pt appears to qualify for outpatient treatment due to co-morbid conditions and/or a member of an at-risk group in accordance with the FDA Emergency Use Authorization.    Symptom onset:  Vaccinated:  Booster?  Immunocompromised? Yes Qualifiers: BMI 40, Type 2 diabetes, hyperlipidemia, CML, RA,   I was unable to get in contact with Ms. Laura Chen via telephone and left a voicemail and MyChart message.  Terri Piedra, NP 03/09/2020 9:43 AM

## 2020-03-13 ENCOUNTER — Emergency Department: Payer: Medicare Other

## 2020-03-13 ENCOUNTER — Other Ambulatory Visit: Payer: Self-pay

## 2020-03-13 ENCOUNTER — Emergency Department
Admission: EM | Admit: 2020-03-13 | Discharge: 2020-03-13 | Disposition: A | Discharge status: Home / Self Care | Payer: Medicare Other | Attending: Emergency Medicine | Admitting: Emergency Medicine

## 2020-03-13 ENCOUNTER — Encounter: Payer: Self-pay | Admitting: Emergency Medicine

## 2020-03-13 DIAGNOSIS — R079 Chest pain, unspecified: Secondary | ICD-10-CM

## 2020-03-13 DIAGNOSIS — R0789 Other chest pain: Secondary | ICD-10-CM | POA: Diagnosis present

## 2020-03-13 DIAGNOSIS — Z87891 Personal history of nicotine dependence: Secondary | ICD-10-CM | POA: Insufficient documentation

## 2020-03-13 DIAGNOSIS — Z79899 Other long term (current) drug therapy: Secondary | ICD-10-CM | POA: Diagnosis not present

## 2020-03-13 DIAGNOSIS — Z794 Long term (current) use of insulin: Secondary | ICD-10-CM | POA: Diagnosis not present

## 2020-03-13 DIAGNOSIS — Z743 Need for continuous supervision: Secondary | ICD-10-CM | POA: Diagnosis not present

## 2020-03-13 DIAGNOSIS — J45909 Unspecified asthma, uncomplicated: Secondary | ICD-10-CM | POA: Diagnosis not present

## 2020-03-13 DIAGNOSIS — R069 Unspecified abnormalities of breathing: Secondary | ICD-10-CM | POA: Diagnosis not present

## 2020-03-13 DIAGNOSIS — Z7982 Long term (current) use of aspirin: Secondary | ICD-10-CM | POA: Insufficient documentation

## 2020-03-13 DIAGNOSIS — U071 COVID-19: Secondary | ICD-10-CM | POA: Insufficient documentation

## 2020-03-13 DIAGNOSIS — N189 Chronic kidney disease, unspecified: Secondary | ICD-10-CM | POA: Insufficient documentation

## 2020-03-13 DIAGNOSIS — I130 Hypertensive heart and chronic kidney disease with heart failure and stage 1 through stage 4 chronic kidney disease, or unspecified chronic kidney disease: Secondary | ICD-10-CM | POA: Insufficient documentation

## 2020-03-13 DIAGNOSIS — E860 Dehydration: Secondary | ICD-10-CM | POA: Diagnosis not present

## 2020-03-13 DIAGNOSIS — I509 Heart failure, unspecified: Secondary | ICD-10-CM | POA: Diagnosis not present

## 2020-03-13 DIAGNOSIS — R0602 Shortness of breath: Secondary | ICD-10-CM | POA: Diagnosis not present

## 2020-03-13 DIAGNOSIS — E1122 Type 2 diabetes mellitus with diabetic chronic kidney disease: Secondary | ICD-10-CM | POA: Insufficient documentation

## 2020-03-13 DIAGNOSIS — E039 Hypothyroidism, unspecified: Secondary | ICD-10-CM | POA: Insufficient documentation

## 2020-03-13 DIAGNOSIS — K449 Diaphragmatic hernia without obstruction or gangrene: Secondary | ICD-10-CM | POA: Diagnosis not present

## 2020-03-13 DIAGNOSIS — I499 Cardiac arrhythmia, unspecified: Secondary | ICD-10-CM | POA: Diagnosis not present

## 2020-03-13 LAB — CBC
HCT: 48.3 % — ABNORMAL HIGH (ref 36.0–46.0)
Hemoglobin: 15.3 g/dL — ABNORMAL HIGH (ref 12.0–15.0)
MCH: 26.1 pg (ref 26.0–34.0)
MCHC: 31.7 g/dL (ref 30.0–36.0)
MCV: 82.4 fL (ref 80.0–100.0)
Platelets: 219 10*3/uL (ref 150–400)
RBC: 5.86 MIL/uL — ABNORMAL HIGH (ref 3.87–5.11)
RDW: 17 % — ABNORMAL HIGH (ref 11.5–15.5)
WBC: 8.8 10*3/uL (ref 4.0–10.5)
nRBC: 0 % (ref 0.0–0.2)

## 2020-03-13 LAB — BASIC METABOLIC PANEL
Anion gap: 14 (ref 5–15)
BUN: 14 mg/dL (ref 6–20)
CO2: 15 mmol/L — ABNORMAL LOW (ref 22–32)
Calcium: 7.9 mg/dL — ABNORMAL LOW (ref 8.9–10.3)
Chloride: 106 mmol/L (ref 98–111)
Creatinine, Ser: 1.3 mg/dL — ABNORMAL HIGH (ref 0.44–1.00)
GFR, Estimated: 49 mL/min — ABNORMAL LOW (ref >60)
Glucose, Bld: 167 mg/dL — ABNORMAL HIGH (ref 70–99)
Potassium: 4.2 mmol/L (ref 3.5–5.1)
Sodium: 135 mmol/L (ref 135–145)

## 2020-03-13 LAB — TROPONIN I (HIGH SENSITIVITY): Troponin I (High Sensitivity): 13 ng/L (ref <18)

## 2020-03-13 MED ORDER — DOXYCYCLINE HYCLATE 50 MG PO CAPS: 100.0000 mg | ORAL_CAPSULE | Freq: Two times a day (BID) | ORAL | 0 refills | Status: AC

## 2020-03-13 MED ORDER — ALBUTEROL SULFATE HFA 108 (90 BASE) MCG/ACT IN AERS: 2.0000 | INHALATION_SPRAY | Freq: Four times a day (QID) | RESPIRATORY_TRACT | 2 refills | Status: AC | PRN

## 2020-03-13 MED ORDER — METHYLPREDNISOLONE 4 MG PO TBPK: ORAL_TABLET | ORAL | 0 refills | Status: AC

## 2020-03-13 MED ORDER — IOHEXOL 350 MG/ML SOLN
100.0000 mL | Freq: Once | INTRAVENOUS | Status: AC | PRN
  Administered 2020-03-13: 100 mL via INTRAVENOUS

## 2020-03-13 MED ORDER — PROMETHAZINE HCL 25 MG RE SUPP: 25.0000 mg | Freq: Four times a day (QID) | RECTAL | 0 refills | Status: AC | PRN

## 2020-03-13 MED ORDER — ONDANSETRON 4 MG PO TBDP
8.0000 mg | ORAL_TABLET | Freq: Once | ORAL | Status: AC
  Administered 2020-03-13: 8 mg via ORAL
  Filled 2020-03-13: qty 2

## 2020-03-13 MED ORDER — LACTATED RINGERS IV BOLUS
1000.0000 mL | Freq: Once | INTRAVENOUS | Status: AC
  Administered 2020-03-13: 1000 mL via INTRAVENOUS

## 2020-03-13 NOTE — ED Notes (Signed)
3 unsuccessful IV attempts by this Probation officer. Dr. Cheri Fowler aware. Will put in order for IV team.

## 2020-03-13 NOTE — ED Notes (Signed)
Report given to Chris RN at change of shift. 

## 2020-03-13 NOTE — ED Triage Notes (Signed)
Presents via EMS c/o chest pain, and N/V since Friday. Reports tested + for covid on Saturday.

## 2020-03-13 NOTE — ED Notes (Signed)
IV team at bedside. Patient informed that her mother called and needs her to call back.

## 2020-03-13 NOTE — ED Notes (Signed)
CT tech informed that patient now has an IV in place. CT tech states that they will be right here. Will hang LR when patient returns.

## 2020-03-13 NOTE — ED Notes (Signed)
Patient denies pain and is resting comfortably.  

## 2020-03-13 NOTE — ED Provider Notes (Signed)
[facility] Emergency Department Provider Note   ____________________________________________   Event Date/Time   First MD Initiated Contact with Patient 03/13/20 1047     (approximate)  I have reviewed the triage vital signs and the nursing notes.   HISTORY  Chief Complaint Chest Pain    HPI Laura Chen is a 55 y.o. female with a stated past medical history of asthma, hypertension, and rheumatoid arthritis who presents complaining of chest pain and nausea/vomiting since yesterday.  Patient states that she was tested positive for COVID approximately 1 week prior to arrival.  Patient states that the symptoms have been worsening over the last 24 hours.  Patient describes an aching, general anterior chest wall pain that is worsened with coughing or deep inspiration as well as palpation.  Patient denies any radiation of this pain.  Patient denies any exertional aspect of this pain.  Patient does endorse exertional shortness of breath.  Patient states last episode of vomiting last night without any blood or coffee-ground.  Patient currently denies any vision changes, tinnitus, difficulty speaking, facial droop, sore throat, abdominal pain, diarrhea, dysuria, or weakness/numbness/paresthesias in any extremity         Past Medical History:  Diagnosis Date  . Asthma   . CML (chronic myeloid leukemia) ([place]) 2011   (Dr. Alyse Low) stopped gleevec 2/2 side effects  . Diabetes mellitus without complication ([place])   . GERD (gastroesophageal reflux disease)   . History of shingles   . History of syphilis 1990   s/p treatment  . Hypertension   . Osteopenia    due to prednisone use, was on reclast  . Psoriasis of vulva   . PUD (peptic ulcer disease)   . Rheumatoid arthritis(714.0) 1990s   Jacoud's arthropathy, previously treated with MTX, TNFa (remicade, enbrel, humira, orencia, rituximab, and actemra)  . SLE (systemic lupus erythematosus) ([place]) 09/2009    Rheum-Dr. Joan Mayans  . Thyroid goiter    Endo-Dr. Eddie Dibbles    Patient Active Problem List   Diagnosis Date Noted  . Acute non-recurrent sinusitis 02/04/2020  . Vasomotor rhinitis 02/04/2020  . Atopic dermatitis 02/04/2020  . Flu vaccine need 02/04/2020  . Encounter for general adult medical examination with abnormal findings 10/01/2019  . Chronic vaginitis 10/01/2019  . Routine cervical smear 10/01/2019  . BMI 40.0-44.9, adult ([place]) 06/09/2019  . Diet-controlled type 2 diabetes mellitus ([place]) 12/18/2018  . Acute cystitis with hematuria 12/18/2018  . Vaginal discharge 11/18/2018  . Screening for breast cancer 09/15/2018  . Acquired hypothyroidism 09/15/2018  . Vitamin D deficiency 09/15/2018  . 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 Iron deficiency anemia 06/16/2018  . Acute vaginitis 02/24/2018  . Hematuria 01/23/2018  . Bacterial vaginitis 01/23/2018  . Type 2 diabetes mellitus without complication, without long-term current use of insulin ([place]) 12/16/2017  . Mixed hyperlipidemia 12/16/2017  . Depression, major, recurrent, moderate ([place]) 12/16/2017  . Urinary tract infection without hematuria 11/11/2017  . Dysuria 11/11/2017  . Vaginal candidiasis 11/11/2017  . Episode of moderate major depression ([place]) 11/11/2017  . Elevated blood pressure reading in office with diagnosis of hypertension 08/14/2017  . Hypokalemia 08/14/2017  . Sepsis ([place]) 06/28/2017  . Acute on chronic kidney failure ([place]) 04/14/2017  . (HFpEF) heart failure with preserved ejection fraction ([place]) 12/12/2016  . Other chest pain 12/12/2016  . Pneumonia 04/30/2015  . Acute renal failure (ARF) ([place]) 04/24/2015  . Elevated LFTs 04/24/2015  . Hyponatremia 04/24/2015  . Influenza A 04/24/2015  .  Oral candidiasis 04/24/2015  . Pulmonary nodule 08/26/2014  . Iritis 04/30/2013  . Steroid responder, bilateral 04/30/2013  . Hemoptysis 11/02/2012  . Irritable bowel syndrome with constipation  05/08/2012  . Complete rupture of rotator cuff 09/19/2011  . 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 Effusion of wrist 11/22/2010  . Hand joint pain 11/22/2010  . Polymenorrhea 10/28/2010  . Anemia 10/28/2010  . External otitis 10/27/2010  . Endometrial polyp 10/27/2010  . Menorrhagia 10/27/2010  . Right flank pain 10/12/2010  . Fatigue 06/23/2010  . Skin rash 06/07/2010  . Edema 06/07/2010  . Tobacco abuse 06/07/2010  . Tobacco dependence syndrome 06/07/2010  . Primary localized osteoarthrosis, lower leg 05/11/2010  . Chronic myeloid leukemia ([place]) 03/24/2010  . ASTHMA 03/24/2010  . GERD 03/24/2010  . SYSTEMIC LUPUS ERYTHEMATOSUS 03/24/2010  . Rheumatoid arthritis ([place]) 03/24/2010  . FLATULENCE ERUCTATION AND GAS PAIN 03/24/2010  . Asthma 03/24/2010    Past Surgical History:  Procedure Laterality Date  . BOTOX INJECTION     in pelvic muscles  . DEXA  2010   osteopenia  . MYOMECTOMY  2008  . right ankle AFO  2011  . TRANSTHORACIC ECHOCARDIOGRAM  123456   nl systolic/diastolic fxn, EF [id number], mild MR, normal PA pressures  . US/HSG  05/2010   possible endometrial polyp, rec rpt 8 wks continue loestrin 24 [facility])    Prior to Admission medications   Medication Sig Start Date End Date Taking? Authorizing Provider  Accu-Chek FastClix Lancets MISC USE TO TEST BLOOD SUGAR  DAILY AS DIRECTED 10/06/19   Lavera Guise, MD  amLODipine (NORVASC) 2.5 MG tablet Take 2.5 mg by mouth daily. 08/26/19   [provider]  ascorbic acid (VITAMIN C) 100 MG tablet Take by mouth.    [provider]  aspirin EC 81 MG tablet Take 81 mg by mouth every evening.     [provider]  buPROPion (WELLBUTRIN SR) 100 MG 12 hr tablet Take 1 tablet (100 mg total) by mouth daily. 01/09/20   Ronnell Freshwater, NP  calcium-vitamin D (OSCAL WITH D) 500-200 MG-UNIT TABS tablet Take by mouth.    [provider]  cetirizine (ZYRTEC) 10 MG tablet Take 10 mg by mouth.    [provider]  cholecalciferol (VITAMIN D) 1000 units tablet Take 2,000 Units by mouth daily.    [provider]  conjugated estrogens (PREMARIN) vaginal cream Apply pea size amount out side the vaginal area daily for 2 weeks 02/06/20   Luiz Ochoa, NP  DULoxetine (CYMBALTA) 20 MG capsule Take 1 capsule (20 mg total) by mouth daily. 01/09/20   Ronnell Freshwater, NP  Evolocumab (REPATHA) 140 MG/ML SOSY Inject 140 mg into the skin every 14 (fourteen) days. 01/19/20   Ronnell Freshwater, NP  fluconazole (DIFLUCAN) 150 MG tablet Take one tablet daily every 3 days until symptoms resolved. 02/02/20   Luiz Ochoa, NP  Fluocinolone Acetonide Body 0.01 % OIL Apply to affected areas on scalp twice daily as needed for itching/redness 01/24/19   [provider]  fluticasone (FLONASE) 50 MCG/ACT nasal spray USE 1 SPRAY IN BOTH  NOSTRILS DAILY. 01/09/20   Ronnell Freshwater, NP  gabapentin (NEURONTIN) 300 MG capsule Take 300 mg by mouth 3 (three) times daily. 12/27/19   [provider]  glucose blood (ACCU-CHEK GUIDE) test strip USE TO CHECK BLOOD GLUCOSE  EVERY DAY AS DIRECTED 03/01/20   Ronnell Freshwater, NP  levothyroxine (SYNTHROID) 50 MCG tablet Take  1 tablet (50 mcg total) by mouth daily. 06/16/19   Ronnell Freshwater, NP  linaclotide (LINZESS) 290 MCG CAPS capsule Take 1 capsule (290 mcg total) by mouth daily. 01/09/20   Ronnell Freshwater, NP  omeprazole (PRILOSEC) 20 MG capsule Take 1 capsule (20 mg total) by mouth daily. 01/09/20   Ronnell Freshwater, NP  ondansetron (ZOFRAN) 8 MG tablet every eight (8) hours as needed. 07/24/17   [provider]  ponatinib HCl (ICLUSIG) 15 MG tablet Take 15 mg by mouth daily.     [provider]  predniSONE (DELTASONE) 5 MG tablet Take 5 mg by mouth daily with breakfast.    [provider]  promethazine (PHENERGAN) 12.5 MG tablet Take 1 tablet (12.5 mg total) by mouth every 6 (six) hours as needed for nausea or  vomiting. 03/07/20   Triplett, Cari B, FNP  Semaglutide, 1 MG/DOSE, (OZEMPIC, 1 MG/DOSE,) 2 MG/1.5ML SOPN Inject 1 mg into the skin once a week. 01/09/20   Ronnell Freshwater, NP  spironolactone (ALDACTONE) 50 MG tablet daily. 07/13/18   [provider]  tacrolimus (PROTOPIC) 0.03 % ointment Apply topically 2 (two) times daily. 01/24/20   Ronnell Freshwater, NP  tiZANidine (ZANAFLEX) 4 MG tablet 1-2 tablets PO  every 8 hours prn pain or muscle spasm 07/29/19   [provider]  traMADol (ULTRAM) 50 MG tablet Take 1 to 2 tablets po QD prn pain 01/09/20   Ronnell Freshwater, NP  valACYclovir (VALTREX) 500 MG tablet Take by mouth.    [provider]    Allergies Furosemide, Ibuprofen, Sulfa antibiotics, Tape, and Thiazide-type diuretics  Family History  Problem Relation Age of Onset  . Heart attack Maternal Grandmother   . Coronary artery disease Maternal Grandmother   . Hypertension Mother   . Hypertension Other        Aunt    Social History Social History   Tobacco Use  . Smoking status: Former Smoker    Packs/day: 0.30    Types: Cigarettes  . Smokeless tobacco: Never Used  . Tobacco comment: 6 cigarettes/day  Vaping Use  . Vaping Use: Never used  Substance Use Topics  . Alcohol use: No  . Drug use: No    Review of Systems Constitutional: Endorses subjective fever/chills Eyes: No visual changes. ENT: No sore throat. Cardiovascular: Endorses chest pain. Respiratory: Endorses shortness of breath. Gastrointestinal: No abdominal pain.  No nausea, no vomiting.  No diarrhea. Genitourinary: Negative for dysuria. Musculoskeletal: Negative for acute arthralgias Skin: Negative for rash. Neurological: Negative for headaches, weakness/numbness/paresthesias in any extremity Psychiatric: Negative for suicidal ideation/homicidal ideation   ____________________________________________   PHYSICAL EXAM:  VITAL SIGNS: ED Triage Vitals  Enc Vitals Group     BP  03/13/20 0939 (!) 110/48     Pulse Rate 03/13/20 0939 (!) 134     Resp 03/13/20 0939 (!) 22     Temp 03/13/20 0939 99.7 F (37.6 C)     Temp Source 03/13/20 0939 Oral     SpO2 03/13/20 0939 95 %     Weight 03/13/20 0940 238 lb (108 kg)     Height 03/13/20 0940 5\' 4"  (1.626 m)     Head Circumference --      Peak Flow --      Pain Score 03/13/20 0948 6     Pain Loc --      Pain Edu? --      Excl. in [place]? --    Constitutional:  Alert and oriented. Well appearing and in no acute distress. Eyes: Conjunctivae are normal. PERRL. Head: Atraumatic. Nose: No congestion/rhinnorhea. Mouth/Throat: Mucous membranes are moist. Neck: No stridor Cardiovascular: Grossly normal heart sounds.  Good peripheral circulation. Respiratory: Normal respiratory effort.  No retractions.  Tachypneic Gastrointestinal: Soft and nontender. No distention. Musculoskeletal: No obvious deformities Neurologic:  Normal speech and language. No gross focal neurologic deficits are appreciated. Skin:  Skin is warm and dry. No rash noted. Psychiatric: Mood and affect are normal. Speech and behavior are normal.  ____________________________________________   LABS (all labs ordered are listed, but only abnormal results are displayed)  Labs Reviewed  BASIC METABOLIC PANEL - Abnormal; Notable for the following components:      Result Value   CO2 15 (*)    Glucose, Bld 167 (*)    Creatinine, Ser 1.30 (*)    Calcium 7.9 (*)    GFR, Estimated 49 (*)    All other components within normal limits  CBC - Abnormal; Notable for the following components:   RBC 5.86 (*)    Hemoglobin 15.3 (*)    HCT 48.3 (*)    RDW 17.0 (*)    All other components within normal limits  POC URINE PREG, ED  TROPONIN I (HIGH SENSITIVITY)   ____________________________________________  EKG  ED ECG REPORT I, Naaman Plummer, the attending physician, personally viewed and interpreted this ECG.  Date: 03/13/2020 EKG Time: 0942 Rate:  133 Rhythm: Tachycardic sinus rhythm QRS Axis: normal Intervals: normal ST/T Wave abnormalities: normal Narrative Interpretation: no evidence of acute ischemia  ____________________________________________  RADIOLOGY  ED MD interpretation: 2 view x-ray of the chest shows only chronic changes without any evidence of acute pneumonia, pneumothorax, or widened mediastinum  Official radiology report(s): DG Chest 2 View  Result Date: 03/13/2020 CLINICAL DATA:  SOB, CP EXAM: CHEST - 2 VIEW COMPARISON:  06/10/2019 and prior. FINDINGS: No pneumothorax. Unchanged linear left lung opacities, scarring versus atelectasis. Chronic right hemidiaphragm elevation and blunting of the right costophrenic sulcus. No new focal consolidation. Mild chronic bronchitic changes. Stable cardiomediastinal silhouette. IMPRESSION: Mild chronic bronchitic changes without new focal consolidation. Chronic scarring and blunting of the right costophrenic sulcus, unchanged. Electronically Signed   By: Primitivo Gauze M.D.   On: 03/13/2020 10:34    ____________________________________________   PROCEDURES  Procedure(s) performed (including Critical Care):  .1-3 Lead EKG Interpretation Performed by: Naaman Plummer, MD Authorized by: Naaman Plummer, MD     Interpretation: normal     ECG rate:  92   ECG rate assessment: normal     Rhythm: sinus rhythm     Ectopy: none     Conduction: normal       ____________________________________________   INITIAL IMPRESSION / ASSESSMENT AND PLAN / ED COURSE  As part of my medical decision making, I reviewed the following data within the [facility] notes reviewed and incorporated, Labs reviewed, EKG interpreted, Old chart reviewed, Radiograph reviewed and Notes from prior ED visits reviewed and incorporated        Presentation most consistent with Viral Syndrome.  Patient has tested positive for COVID-19. Based on vitals and exam they are  nontoxic and stable for discharge.  Given History and Exam I have a lower suspicion for: Emergent CardioPulmonary causes [such as Acute Asthma or COPD Exacerbation, acute Heart Failure or exacerbation, PE, PTX, atypical ACS, PNA]. Emergent Otolaryngeal causes [such as PTA, RPA, Ludwigs, Epiglottitis, EBV].  Regarding Emergent Travel or Immunosuppressive  related infectious: I have a low suspicion for acute HIV.  Will provide strict return precautions and instructions on self-isolation/quarantine and anticipatory guidance.      ____________________________________________   FINAL CLINICAL IMPRESSION(S) / ED DIAGNOSES  Final diagnoses:  None     ED Discharge Orders    None       Note:  This document was prepared using Dragon voice recognition software and may include unintentional dictation errors.   Naaman Plummer, MD 03/16/20 [phone]

## 2020-03-15 ENCOUNTER — Telehealth: Payer: Self-pay

## 2020-03-15 DIAGNOSIS — M059 Rheumatoid arthritis with rheumatoid factor, unspecified: Principal | ICD-10-CM

## 2020-03-15 MED ORDER — OXYCODONE 5 MG TABLET
ORAL_TABLET | Freq: Three times a day (TID) | ORAL | 0 refills | 30.00000 days | Status: CP | PRN
Start: 2020-03-15 — End: 2020-03-17

## 2020-03-17 DIAGNOSIS — M059 Rheumatoid arthritis with rheumatoid factor, unspecified: Principal | ICD-10-CM

## 2020-03-17 MED ORDER — OXYCODONE 5 MG TABLET
ORAL_TABLET | Freq: Three times a day (TID) | ORAL | 0 refills | 30.00000 days | Status: CP | PRN
Start: 2020-03-17 — End: 2020-04-16

## 2020-03-18 ENCOUNTER — Other Ambulatory Visit: Payer: Self-pay | Admitting: Hospice and Palliative Medicine

## 2020-03-18 NOTE — Telephone Encounter (Signed)
Spoke to pt and she advised that she is still feeling the same with her symptoms but isn't running a fever and she is still having issues with her breathing.  Per Lovena Le I informed pt to increase her fluid intake, to rest and finish her antibiotic and prednisone and continue using her inhaler and in 10 days if she is not feeling any better to let us know.  I also told patient that if she felt like her breathing got any worse to go to the ER.  Pt doesn't have a oximetry to keep check of her oxygen levels.

## 2020-03-18 NOTE — Telephone Encounter (Signed)
Please call and check on Ms. Laura Chen. She was seen in ED and tested positive for COVID. Let me know if she needs anything. Thanks!

## 2020-03-19 ENCOUNTER — Ambulatory Visit: Payer: Medicare Other | Admitting: Hospice and Palliative Medicine

## 2020-03-19 ENCOUNTER — Encounter: Payer: Self-pay | Admitting: Hospice and Palliative Medicine

## 2020-03-19 ENCOUNTER — Ambulatory Visit: Admit: 2020-03-19 | Discharge: 2020-05-09 | Disposition: A | Discharge status: Hospice | Payer: MEDICARE

## 2020-03-19 DIAGNOSIS — D849 Immunodeficiency, unspecified: Secondary | ICD-10-CM | POA: Diagnosis not present

## 2020-03-19 DIAGNOSIS — R197 Diarrhea, unspecified: Secondary | ICD-10-CM | POA: Diagnosis not present

## 2020-03-19 DIAGNOSIS — R7989 Other specified abnormal findings of blood chemistry: Secondary | ICD-10-CM | POA: Diagnosis not present

## 2020-03-19 DIAGNOSIS — J90 Pleural effusion, not elsewhere classified: Secondary | ICD-10-CM | POA: Diagnosis not present

## 2020-03-19 DIAGNOSIS — E43 Unspecified severe protein-calorie malnutrition: Secondary | ICD-10-CM | POA: Diagnosis not present

## 2020-03-19 DIAGNOSIS — M320 Drug-induced systemic lupus erythematosus: Secondary | ICD-10-CM | POA: Diagnosis not present

## 2020-03-19 DIAGNOSIS — Z515 Encounter for palliative care: Secondary | ICD-10-CM | POA: Diagnosis not present

## 2020-03-19 DIAGNOSIS — J1282 Pneumonia due to coronavirus disease 2019: Secondary | ICD-10-CM | POA: Diagnosis not present

## 2020-03-19 DIAGNOSIS — R059 Cough, unspecified: Secondary | ICD-10-CM | POA: Diagnosis not present

## 2020-03-19 DIAGNOSIS — C9210 Chronic myeloid leukemia, BCR/ABL-positive, not having achieved remission: Secondary | ICD-10-CM | POA: Diagnosis not present

## 2020-03-19 DIAGNOSIS — R0600 Dyspnea, unspecified: Secondary | ICD-10-CM | POA: Diagnosis not present

## 2020-03-19 DIAGNOSIS — R918 Other nonspecific abnormal finding of lung field: Secondary | ICD-10-CM | POA: Diagnosis not present

## 2020-03-19 DIAGNOSIS — J9811 Atelectasis: Secondary | ICD-10-CM | POA: Diagnosis not present

## 2020-03-19 DIAGNOSIS — R7401 Elevation of levels of liver transaminase levels: Secondary | ICD-10-CM | POA: Diagnosis not present

## 2020-03-19 DIAGNOSIS — D688 Other specified coagulation defects: Secondary | ICD-10-CM | POA: Diagnosis not present

## 2020-03-19 DIAGNOSIS — D6859 Other primary thrombophilia: Secondary | ICD-10-CM | POA: Diagnosis not present

## 2020-03-19 DIAGNOSIS — R000 Tachycardia, unspecified: Secondary | ICD-10-CM | POA: Diagnosis not present

## 2020-03-19 DIAGNOSIS — R110 Nausea: Secondary | ICD-10-CM | POA: Diagnosis not present

## 2020-03-19 DIAGNOSIS — R109 Unspecified abdominal pain: Secondary | ICD-10-CM | POA: Diagnosis not present

## 2020-03-19 DIAGNOSIS — Z743 Need for continuous supervision: Secondary | ICD-10-CM | POA: Diagnosis not present

## 2020-03-19 DIAGNOSIS — I5030 Unspecified diastolic (congestive) heart failure: Secondary | ICD-10-CM | POA: Diagnosis not present

## 2020-03-19 DIAGNOSIS — Z9981 Dependence on supplemental oxygen: Secondary | ICD-10-CM | POA: Diagnosis not present

## 2020-03-19 DIAGNOSIS — E119 Type 2 diabetes mellitus without complications: Secondary | ICD-10-CM | POA: Diagnosis not present

## 2020-03-19 DIAGNOSIS — J811 Chronic pulmonary edema: Secondary | ICD-10-CM | POA: Diagnosis not present

## 2020-03-19 DIAGNOSIS — M069 Rheumatoid arthritis, unspecified: Secondary | ICD-10-CM | POA: Diagnosis not present

## 2020-03-19 DIAGNOSIS — Z9911 Dependence on respirator [ventilator] status: Secondary | ICD-10-CM | POA: Diagnosis not present

## 2020-03-19 DIAGNOSIS — Z7189 Other specified counseling: Secondary | ICD-10-CM | POA: Diagnosis not present

## 2020-03-19 DIAGNOSIS — A4189 Other specified sepsis: Secondary | ICD-10-CM | POA: Diagnosis not present

## 2020-03-19 DIAGNOSIS — D6869 Other thrombophilia: Secondary | ICD-10-CM | POA: Diagnosis not present

## 2020-03-19 DIAGNOSIS — J80 Acute respiratory distress syndrome: Secondary | ICD-10-CM | POA: Diagnosis not present

## 2020-03-19 DIAGNOSIS — D696 Thrombocytopenia, unspecified: Secondary | ICD-10-CM | POA: Diagnosis not present

## 2020-03-19 DIAGNOSIS — E1165 Type 2 diabetes mellitus with hyperglycemia: Secondary | ICD-10-CM | POA: Diagnosis not present

## 2020-03-19 DIAGNOSIS — I110 Hypertensive heart disease with heart failure: Secondary | ICD-10-CM | POA: Diagnosis not present

## 2020-03-19 DIAGNOSIS — M329 Systemic lupus erythematosus, unspecified: Secondary | ICD-10-CM | POA: Diagnosis not present

## 2020-03-19 DIAGNOSIS — E039 Hypothyroidism, unspecified: Secondary | ICD-10-CM | POA: Diagnosis not present

## 2020-03-19 DIAGNOSIS — R112 Nausea with vomiting, unspecified: Secondary | ICD-10-CM | POA: Diagnosis not present

## 2020-03-19 DIAGNOSIS — R6520 Severe sepsis without septic shock: Secondary | ICD-10-CM | POA: Diagnosis not present

## 2020-03-19 DIAGNOSIS — E871 Hypo-osmolality and hyponatremia: Secondary | ICD-10-CM | POA: Diagnosis not present

## 2020-03-19 DIAGNOSIS — Z66 Do not resuscitate: Secondary | ICD-10-CM | POA: Diagnosis not present

## 2020-03-19 DIAGNOSIS — N179 Acute kidney failure, unspecified: Secondary | ICD-10-CM | POA: Diagnosis not present

## 2020-03-19 DIAGNOSIS — J479 Bronchiectasis, uncomplicated: Secondary | ICD-10-CM | POA: Diagnosis not present

## 2020-03-19 DIAGNOSIS — R627 Adult failure to thrive: Secondary | ICD-10-CM | POA: Diagnosis not present

## 2020-03-19 DIAGNOSIS — R439 Unspecified disturbances of smell and taste: Secondary | ICD-10-CM | POA: Diagnosis not present

## 2020-03-19 DIAGNOSIS — R042 Hemoptysis: Secondary | ICD-10-CM | POA: Diagnosis not present

## 2020-03-19 DIAGNOSIS — R0602 Shortness of breath: Secondary | ICD-10-CM | POA: Diagnosis not present

## 2020-03-19 DIAGNOSIS — M7989 Other specified soft tissue disorders: Secondary | ICD-10-CM | POA: Diagnosis not present

## 2020-03-19 DIAGNOSIS — M6250 Muscle wasting and atrophy, not elsewhere classified, unspecified site: Secondary | ICD-10-CM | POA: Diagnosis not present

## 2020-03-19 DIAGNOSIS — I5032 Chronic diastolic (congestive) heart failure: Secondary | ICD-10-CM | POA: Diagnosis not present

## 2020-03-19 DIAGNOSIS — J9601 Acute respiratory failure with hypoxia: Secondary | ICD-10-CM | POA: Diagnosis not present

## 2020-03-19 DIAGNOSIS — R0902 Hypoxemia: Secondary | ICD-10-CM | POA: Diagnosis not present

## 2020-03-19 DIAGNOSIS — J9690 Respiratory failure, unspecified, unspecified whether with hypoxia or hypercapnia: Secondary | ICD-10-CM | POA: Diagnosis not present

## 2020-03-19 DIAGNOSIS — U071 COVID-19: Secondary | ICD-10-CM | POA: Diagnosis not present

## 2020-03-19 DIAGNOSIS — E874 Mixed disorder of acid-base balance: Secondary | ICD-10-CM | POA: Diagnosis not present

## 2020-03-20 DIAGNOSIS — R109 Unspecified abdominal pain: Secondary | ICD-10-CM | POA: Diagnosis not present

## 2020-03-20 DIAGNOSIS — J9601 Acute respiratory failure with hypoxia: Secondary | ICD-10-CM | POA: Diagnosis not present

## 2020-03-20 DIAGNOSIS — N179 Acute kidney failure, unspecified: Secondary | ICD-10-CM | POA: Diagnosis not present

## 2020-03-20 DIAGNOSIS — U071 COVID-19: Secondary | ICD-10-CM | POA: Diagnosis not present

## 2020-03-20 DIAGNOSIS — J1282 Pneumonia due to coronavirus disease 2019: Secondary | ICD-10-CM | POA: Diagnosis not present

## 2020-03-20 DIAGNOSIS — R918 Other nonspecific abnormal finding of lung field: Secondary | ICD-10-CM | POA: Diagnosis not present

## 2020-03-21 DIAGNOSIS — M7989 Other specified soft tissue disorders: Secondary | ICD-10-CM | POA: Diagnosis not present

## 2020-03-21 DIAGNOSIS — M320 Drug-induced systemic lupus erythematosus: Secondary | ICD-10-CM | POA: Diagnosis not present

## 2020-03-21 DIAGNOSIS — E119 Type 2 diabetes mellitus without complications: Secondary | ICD-10-CM | POA: Diagnosis not present

## 2020-03-21 DIAGNOSIS — N179 Acute kidney failure, unspecified: Secondary | ICD-10-CM | POA: Diagnosis not present

## 2020-03-21 DIAGNOSIS — R109 Unspecified abdominal pain: Secondary | ICD-10-CM | POA: Diagnosis not present

## 2020-03-21 DIAGNOSIS — J1282 Pneumonia due to coronavirus disease 2019: Secondary | ICD-10-CM | POA: Diagnosis not present

## 2020-03-21 DIAGNOSIS — J9601 Acute respiratory failure with hypoxia: Secondary | ICD-10-CM | POA: Diagnosis not present

## 2020-03-21 DIAGNOSIS — C9210 Chronic myeloid leukemia, BCR/ABL-positive, not having achieved remission: Secondary | ICD-10-CM | POA: Diagnosis not present

## 2020-03-21 DIAGNOSIS — U071 COVID-19: Secondary | ICD-10-CM | POA: Diagnosis not present

## 2020-03-21 DIAGNOSIS — R112 Nausea with vomiting, unspecified: Secondary | ICD-10-CM | POA: Diagnosis not present

## 2020-03-22 DIAGNOSIS — U071 COVID-19: Secondary | ICD-10-CM | POA: Diagnosis not present

## 2020-03-22 DIAGNOSIS — N179 Acute kidney failure, unspecified: Secondary | ICD-10-CM | POA: Diagnosis not present

## 2020-03-22 DIAGNOSIS — J1282 Pneumonia due to coronavirus disease 2019: Secondary | ICD-10-CM | POA: Diagnosis not present

## 2020-03-22 DIAGNOSIS — J80 Acute respiratory distress syndrome: Secondary | ICD-10-CM | POA: Diagnosis not present

## 2020-03-22 DIAGNOSIS — J9601 Acute respiratory failure with hypoxia: Secondary | ICD-10-CM | POA: Diagnosis not present

## 2020-03-23 DIAGNOSIS — J9601 Acute respiratory failure with hypoxia: Secondary | ICD-10-CM | POA: Diagnosis not present

## 2020-03-23 DIAGNOSIS — J1282 Pneumonia due to coronavirus disease 2019: Secondary | ICD-10-CM | POA: Diagnosis not present

## 2020-03-23 DIAGNOSIS — U071 COVID-19: Secondary | ICD-10-CM | POA: Diagnosis not present

## 2020-03-23 DIAGNOSIS — N179 Acute kidney failure, unspecified: Secondary | ICD-10-CM | POA: Diagnosis not present

## 2020-03-24 DIAGNOSIS — J9601 Acute respiratory failure with hypoxia: Secondary | ICD-10-CM | POA: Diagnosis not present

## 2020-03-24 DIAGNOSIS — J1282 Pneumonia due to coronavirus disease 2019: Secondary | ICD-10-CM | POA: Diagnosis not present

## 2020-03-24 DIAGNOSIS — U071 COVID-19: Secondary | ICD-10-CM | POA: Diagnosis not present

## 2020-03-24 DIAGNOSIS — N179 Acute kidney failure, unspecified: Secondary | ICD-10-CM | POA: Diagnosis not present

## 2020-03-25 DIAGNOSIS — R112 Nausea with vomiting, unspecified: Secondary | ICD-10-CM | POA: Diagnosis not present

## 2020-03-25 DIAGNOSIS — J90 Pleural effusion, not elsewhere classified: Secondary | ICD-10-CM | POA: Diagnosis not present

## 2020-03-25 DIAGNOSIS — R0600 Dyspnea, unspecified: Secondary | ICD-10-CM | POA: Diagnosis not present

## 2020-03-25 DIAGNOSIS — J1282 Pneumonia due to coronavirus disease 2019: Secondary | ICD-10-CM | POA: Diagnosis not present

## 2020-03-25 DIAGNOSIS — J9601 Acute respiratory failure with hypoxia: Secondary | ICD-10-CM | POA: Diagnosis not present

## 2020-03-25 DIAGNOSIS — U071 COVID-19: Secondary | ICD-10-CM | POA: Diagnosis not present

## 2020-03-25 DIAGNOSIS — R918 Other nonspecific abnormal finding of lung field: Secondary | ICD-10-CM | POA: Diagnosis not present

## 2020-03-25 DIAGNOSIS — N179 Acute kidney failure, unspecified: Secondary | ICD-10-CM | POA: Diagnosis not present

## 2020-03-26 DIAGNOSIS — J1282 Pneumonia due to coronavirus disease 2019: Secondary | ICD-10-CM | POA: Diagnosis not present

## 2020-03-26 DIAGNOSIS — J9601 Acute respiratory failure with hypoxia: Secondary | ICD-10-CM | POA: Diagnosis not present

## 2020-03-26 DIAGNOSIS — N179 Acute kidney failure, unspecified: Secondary | ICD-10-CM | POA: Diagnosis not present

## 2020-03-26 DIAGNOSIS — U071 COVID-19: Secondary | ICD-10-CM | POA: Diagnosis not present

## 2020-03-26 DIAGNOSIS — R112 Nausea with vomiting, unspecified: Secondary | ICD-10-CM | POA: Diagnosis not present

## 2020-03-27 DIAGNOSIS — J1282 Pneumonia due to coronavirus disease 2019: Secondary | ICD-10-CM | POA: Diagnosis not present

## 2020-03-27 DIAGNOSIS — J9601 Acute respiratory failure with hypoxia: Secondary | ICD-10-CM | POA: Diagnosis not present

## 2020-03-27 DIAGNOSIS — U071 COVID-19: Secondary | ICD-10-CM | POA: Diagnosis not present

## 2020-03-27 DIAGNOSIS — N179 Acute kidney failure, unspecified: Secondary | ICD-10-CM | POA: Diagnosis not present

## 2020-03-28 DIAGNOSIS — U071 COVID-19: Secondary | ICD-10-CM | POA: Diagnosis not present

## 2020-03-28 DIAGNOSIS — N179 Acute kidney failure, unspecified: Secondary | ICD-10-CM | POA: Diagnosis not present

## 2020-03-28 DIAGNOSIS — J1282 Pneumonia due to coronavirus disease 2019: Secondary | ICD-10-CM | POA: Diagnosis not present

## 2020-03-28 DIAGNOSIS — J9601 Acute respiratory failure with hypoxia: Secondary | ICD-10-CM | POA: Diagnosis not present

## 2020-03-29 ENCOUNTER — Other Ambulatory Visit: Payer: Self-pay | Admitting: *Deleted

## 2020-03-29 DIAGNOSIS — J1282 Pneumonia due to coronavirus disease 2019: Secondary | ICD-10-CM | POA: Diagnosis not present

## 2020-03-29 DIAGNOSIS — N179 Acute kidney failure, unspecified: Secondary | ICD-10-CM | POA: Diagnosis not present

## 2020-03-29 DIAGNOSIS — J9601 Acute respiratory failure with hypoxia: Secondary | ICD-10-CM | POA: Diagnosis not present

## 2020-03-29 DIAGNOSIS — U071 COVID-19: Secondary | ICD-10-CM | POA: Diagnosis not present

## 2020-03-29 NOTE — Patient Outreach (Addendum)
[facility]) Care Management  03/29/2020  Alania NAIOMY WATTERS 1965-12-24 [id number]   Patient was admitted to [facility] on 03/19/20. Nurse Health Coach will perform case closure and will informed complex care management to follow up when patient is discharged.    Plan: RN Health Coach Discipline Closure Discipline Closure letter sent to PCP   Emelia Loron RN, [place] [phone] [email or web address]

## 2020-03-30 DIAGNOSIS — U071 COVID-19: Secondary | ICD-10-CM | POA: Diagnosis not present

## 2020-03-30 DIAGNOSIS — J9601 Acute respiratory failure with hypoxia: Secondary | ICD-10-CM | POA: Diagnosis not present

## 2020-03-30 DIAGNOSIS — R7989 Other specified abnormal findings of blood chemistry: Secondary | ICD-10-CM | POA: Diagnosis not present

## 2020-03-30 DIAGNOSIS — R7401 Elevation of levels of liver transaminase levels: Secondary | ICD-10-CM | POA: Diagnosis not present

## 2020-03-30 DIAGNOSIS — J1282 Pneumonia due to coronavirus disease 2019: Secondary | ICD-10-CM | POA: Diagnosis not present

## 2020-03-30 DIAGNOSIS — E039 Hypothyroidism, unspecified: Secondary | ICD-10-CM | POA: Diagnosis not present

## 2020-03-31 DIAGNOSIS — E039 Hypothyroidism, unspecified: Secondary | ICD-10-CM | POA: Diagnosis not present

## 2020-03-31 DIAGNOSIS — R918 Other nonspecific abnormal finding of lung field: Secondary | ICD-10-CM | POA: Diagnosis not present

## 2020-03-31 DIAGNOSIS — U071 COVID-19: Secondary | ICD-10-CM | POA: Diagnosis not present

## 2020-03-31 DIAGNOSIS — R0902 Hypoxemia: Secondary | ICD-10-CM | POA: Diagnosis not present

## 2020-03-31 DIAGNOSIS — J1282 Pneumonia due to coronavirus disease 2019: Secondary | ICD-10-CM | POA: Diagnosis not present

## 2020-03-31 DIAGNOSIS — R7401 Elevation of levels of liver transaminase levels: Secondary | ICD-10-CM | POA: Diagnosis not present

## 2020-03-31 DIAGNOSIS — J9601 Acute respiratory failure with hypoxia: Secondary | ICD-10-CM | POA: Diagnosis not present

## 2020-04-01 DIAGNOSIS — U071 COVID-19: Secondary | ICD-10-CM | POA: Diagnosis not present

## 2020-04-01 DIAGNOSIS — J9601 Acute respiratory failure with hypoxia: Secondary | ICD-10-CM | POA: Diagnosis not present

## 2020-04-01 DIAGNOSIS — Z9911 Dependence on respirator [ventilator] status: Secondary | ICD-10-CM | POA: Diagnosis not present

## 2020-04-01 DIAGNOSIS — J1282 Pneumonia due to coronavirus disease 2019: Secondary | ICD-10-CM | POA: Diagnosis not present

## 2020-04-02 DIAGNOSIS — J9601 Acute respiratory failure with hypoxia: Secondary | ICD-10-CM | POA: Diagnosis not present

## 2020-04-02 DIAGNOSIS — J9690 Respiratory failure, unspecified, unspecified whether with hypoxia or hypercapnia: Secondary | ICD-10-CM | POA: Diagnosis not present

## 2020-04-02 DIAGNOSIS — U071 COVID-19: Secondary | ICD-10-CM | POA: Diagnosis not present

## 2020-04-02 DIAGNOSIS — J90 Pleural effusion, not elsewhere classified: Secondary | ICD-10-CM | POA: Diagnosis not present

## 2020-04-02 DIAGNOSIS — Z9981 Dependence on supplemental oxygen: Secondary | ICD-10-CM | POA: Diagnosis not present

## 2020-04-02 DIAGNOSIS — D6859 Other primary thrombophilia: Secondary | ICD-10-CM | POA: Diagnosis not present

## 2020-04-03 DIAGNOSIS — U071 COVID-19: Secondary | ICD-10-CM | POA: Diagnosis not present

## 2020-04-03 DIAGNOSIS — I5030 Unspecified diastolic (congestive) heart failure: Secondary | ICD-10-CM | POA: Diagnosis not present

## 2020-04-03 DIAGNOSIS — J9601 Acute respiratory failure with hypoxia: Secondary | ICD-10-CM | POA: Diagnosis not present

## 2020-04-03 DIAGNOSIS — J1282 Pneumonia due to coronavirus disease 2019: Secondary | ICD-10-CM | POA: Diagnosis not present

## 2020-04-04 DIAGNOSIS — U071 COVID-19: Secondary | ICD-10-CM | POA: Diagnosis not present

## 2020-04-04 DIAGNOSIS — J9601 Acute respiratory failure with hypoxia: Secondary | ICD-10-CM | POA: Diagnosis not present

## 2020-04-04 DIAGNOSIS — Z9981 Dependence on supplemental oxygen: Secondary | ICD-10-CM | POA: Diagnosis not present

## 2020-04-04 DIAGNOSIS — D6859 Other primary thrombophilia: Secondary | ICD-10-CM | POA: Diagnosis not present

## 2020-04-05 DIAGNOSIS — Z9981 Dependence on supplemental oxygen: Secondary | ICD-10-CM | POA: Diagnosis not present

## 2020-04-05 DIAGNOSIS — J1282 Pneumonia due to coronavirus disease 2019: Secondary | ICD-10-CM | POA: Diagnosis not present

## 2020-04-05 DIAGNOSIS — J9601 Acute respiratory failure with hypoxia: Secondary | ICD-10-CM | POA: Diagnosis not present

## 2020-04-05 DIAGNOSIS — U071 COVID-19: Secondary | ICD-10-CM | POA: Diagnosis not present

## 2020-04-05 DIAGNOSIS — J90 Pleural effusion, not elsewhere classified: Secondary | ICD-10-CM | POA: Diagnosis not present

## 2020-04-06 DIAGNOSIS — U071 COVID-19: Secondary | ICD-10-CM | POA: Diagnosis not present

## 2020-04-06 DIAGNOSIS — Z9981 Dependence on supplemental oxygen: Secondary | ICD-10-CM | POA: Diagnosis not present

## 2020-04-06 DIAGNOSIS — J9601 Acute respiratory failure with hypoxia: Secondary | ICD-10-CM | POA: Diagnosis not present

## 2020-04-06 DIAGNOSIS — D6859 Other primary thrombophilia: Secondary | ICD-10-CM | POA: Diagnosis not present

## 2020-04-07 ENCOUNTER — Other Ambulatory Visit: Payer: Self-pay | Admitting: *Deleted

## 2020-04-07 DIAGNOSIS — J9601 Acute respiratory failure with hypoxia: Secondary | ICD-10-CM | POA: Diagnosis not present

## 2020-04-07 DIAGNOSIS — Z9981 Dependence on supplemental oxygen: Secondary | ICD-10-CM | POA: Diagnosis not present

## 2020-04-07 DIAGNOSIS — J80 Acute respiratory distress syndrome: Secondary | ICD-10-CM | POA: Diagnosis not present

## 2020-04-07 DIAGNOSIS — J1282 Pneumonia due to coronavirus disease 2019: Secondary | ICD-10-CM | POA: Diagnosis not present

## 2020-04-07 DIAGNOSIS — U071 COVID-19: Secondary | ICD-10-CM | POA: Diagnosis not present

## 2020-04-07 NOTE — Patient Outreach (Signed)
[facility]) Care Management  04/07/2020  Laura Chen 1966/01/31 [id number]   Received referral 2/7 however pt was inpt status at [facility]. Pt remains inpt status for 18 days for acute respiratory failure with hypoxia.   Will close this case at this time as pt is not eligible for [facility] services as a inpt status. Will notify her provider of this closure.   Raina Mina, RN Care Management Coordinator [place] Office [phone]

## 2020-04-08 DIAGNOSIS — R918 Other nonspecific abnormal finding of lung field: Secondary | ICD-10-CM | POA: Diagnosis not present

## 2020-04-08 DIAGNOSIS — J1282 Pneumonia due to coronavirus disease 2019: Secondary | ICD-10-CM | POA: Diagnosis not present

## 2020-04-08 DIAGNOSIS — U071 COVID-19: Secondary | ICD-10-CM | POA: Diagnosis not present

## 2020-04-08 DIAGNOSIS — Z9981 Dependence on supplemental oxygen: Secondary | ICD-10-CM | POA: Diagnosis not present

## 2020-04-08 DIAGNOSIS — R0902 Hypoxemia: Secondary | ICD-10-CM | POA: Diagnosis not present

## 2020-04-08 DIAGNOSIS — J9601 Acute respiratory failure with hypoxia: Secondary | ICD-10-CM | POA: Diagnosis not present

## 2020-04-08 MED ORDER — AMLODIPINE 2.5 MG TABLET
ORAL_TABLET | 3 refills | 0 days | Status: CP
Start: 2020-04-08 — End: ?

## 2020-04-09 DIAGNOSIS — D6859 Other primary thrombophilia: Secondary | ICD-10-CM | POA: Diagnosis not present

## 2020-04-09 DIAGNOSIS — U071 COVID-19: Secondary | ICD-10-CM | POA: Diagnosis not present

## 2020-04-09 DIAGNOSIS — J1282 Pneumonia due to coronavirus disease 2019: Secondary | ICD-10-CM | POA: Diagnosis not present

## 2020-04-09 DIAGNOSIS — J9601 Acute respiratory failure with hypoxia: Secondary | ICD-10-CM | POA: Diagnosis not present

## 2020-04-10 DIAGNOSIS — U071 COVID-19: Secondary | ICD-10-CM | POA: Diagnosis not present

## 2020-04-10 DIAGNOSIS — J9601 Acute respiratory failure with hypoxia: Secondary | ICD-10-CM | POA: Diagnosis not present

## 2020-04-10 DIAGNOSIS — J1282 Pneumonia due to coronavirus disease 2019: Secondary | ICD-10-CM | POA: Diagnosis not present

## 2020-04-10 DIAGNOSIS — D6859 Other primary thrombophilia: Secondary | ICD-10-CM | POA: Diagnosis not present

## 2020-04-11 DIAGNOSIS — J1282 Pneumonia due to coronavirus disease 2019: Secondary | ICD-10-CM | POA: Diagnosis not present

## 2020-04-11 DIAGNOSIS — Z9981 Dependence on supplemental oxygen: Secondary | ICD-10-CM | POA: Diagnosis not present

## 2020-04-11 DIAGNOSIS — I5030 Unspecified diastolic (congestive) heart failure: Secondary | ICD-10-CM | POA: Diagnosis not present

## 2020-04-11 DIAGNOSIS — U071 COVID-19: Secondary | ICD-10-CM | POA: Diagnosis not present

## 2020-04-11 DIAGNOSIS — R042 Hemoptysis: Secondary | ICD-10-CM | POA: Diagnosis not present

## 2020-04-11 DIAGNOSIS — J9601 Acute respiratory failure with hypoxia: Secondary | ICD-10-CM | POA: Diagnosis not present

## 2020-04-12 DIAGNOSIS — Z9981 Dependence on supplemental oxygen: Secondary | ICD-10-CM | POA: Diagnosis not present

## 2020-04-12 DIAGNOSIS — J9601 Acute respiratory failure with hypoxia: Secondary | ICD-10-CM | POA: Diagnosis not present

## 2020-04-12 DIAGNOSIS — I5030 Unspecified diastolic (congestive) heart failure: Secondary | ICD-10-CM | POA: Diagnosis not present

## 2020-04-12 DIAGNOSIS — D688 Other specified coagulation defects: Secondary | ICD-10-CM | POA: Diagnosis not present

## 2020-04-12 DIAGNOSIS — U071 COVID-19: Secondary | ICD-10-CM | POA: Diagnosis not present

## 2020-04-13 ENCOUNTER — Other Ambulatory Visit: Payer: Self-pay | Admitting: Hospice and Palliative Medicine

## 2020-04-13 DIAGNOSIS — J9601 Acute respiratory failure with hypoxia: Secondary | ICD-10-CM | POA: Diagnosis not present

## 2020-04-13 DIAGNOSIS — D688 Other specified coagulation defects: Secondary | ICD-10-CM | POA: Diagnosis not present

## 2020-04-13 DIAGNOSIS — U071 COVID-19: Secondary | ICD-10-CM | POA: Diagnosis not present

## 2020-04-13 DIAGNOSIS — I5030 Unspecified diastolic (congestive) heart failure: Secondary | ICD-10-CM | POA: Diagnosis not present

## 2020-04-13 DIAGNOSIS — Z9981 Dependence on supplemental oxygen: Secondary | ICD-10-CM | POA: Diagnosis not present

## 2020-04-14 DIAGNOSIS — U071 COVID-19: Secondary | ICD-10-CM | POA: Diagnosis not present

## 2020-04-14 DIAGNOSIS — J9601 Acute respiratory failure with hypoxia: Secondary | ICD-10-CM | POA: Diagnosis not present

## 2020-04-14 DIAGNOSIS — D688 Other specified coagulation defects: Secondary | ICD-10-CM | POA: Diagnosis not present

## 2020-04-14 DIAGNOSIS — I5030 Unspecified diastolic (congestive) heart failure: Secondary | ICD-10-CM | POA: Diagnosis not present

## 2020-04-14 DIAGNOSIS — Z9981 Dependence on supplemental oxygen: Secondary | ICD-10-CM | POA: Diagnosis not present

## 2020-04-15 ENCOUNTER — Ambulatory Visit: Admit: 2020-04-15 | Payer: MEDICARE

## 2020-04-15 DIAGNOSIS — U071 COVID-19: Secondary | ICD-10-CM | POA: Diagnosis not present

## 2020-04-15 DIAGNOSIS — R0902 Hypoxemia: Secondary | ICD-10-CM | POA: Diagnosis not present

## 2020-04-15 DIAGNOSIS — I5030 Unspecified diastolic (congestive) heart failure: Secondary | ICD-10-CM | POA: Diagnosis not present

## 2020-04-15 DIAGNOSIS — J9601 Acute respiratory failure with hypoxia: Secondary | ICD-10-CM | POA: Diagnosis not present

## 2020-04-15 DIAGNOSIS — D688 Other specified coagulation defects: Secondary | ICD-10-CM | POA: Diagnosis not present

## 2020-04-15 DIAGNOSIS — Z9981 Dependence on supplemental oxygen: Secondary | ICD-10-CM | POA: Diagnosis not present

## 2020-04-16 DIAGNOSIS — Z9981 Dependence on supplemental oxygen: Secondary | ICD-10-CM | POA: Diagnosis not present

## 2020-04-16 DIAGNOSIS — J9601 Acute respiratory failure with hypoxia: Secondary | ICD-10-CM | POA: Diagnosis not present

## 2020-04-16 DIAGNOSIS — D688 Other specified coagulation defects: Secondary | ICD-10-CM | POA: Diagnosis not present

## 2020-04-16 DIAGNOSIS — U071 COVID-19: Secondary | ICD-10-CM | POA: Diagnosis not present

## 2020-04-17 DIAGNOSIS — Z9981 Dependence on supplemental oxygen: Secondary | ICD-10-CM | POA: Diagnosis not present

## 2020-04-17 DIAGNOSIS — U071 COVID-19: Secondary | ICD-10-CM | POA: Diagnosis not present

## 2020-04-17 DIAGNOSIS — J9601 Acute respiratory failure with hypoxia: Secondary | ICD-10-CM | POA: Diagnosis not present

## 2020-04-17 DIAGNOSIS — D688 Other specified coagulation defects: Secondary | ICD-10-CM | POA: Diagnosis not present

## 2020-04-18 DIAGNOSIS — J9601 Acute respiratory failure with hypoxia: Secondary | ICD-10-CM | POA: Diagnosis not present

## 2020-04-18 DIAGNOSIS — D688 Other specified coagulation defects: Secondary | ICD-10-CM | POA: Diagnosis not present

## 2020-04-18 DIAGNOSIS — U071 COVID-19: Secondary | ICD-10-CM | POA: Diagnosis not present

## 2020-04-18 DIAGNOSIS — Z9981 Dependence on supplemental oxygen: Secondary | ICD-10-CM | POA: Diagnosis not present

## 2020-04-19 DIAGNOSIS — U071 COVID-19: Secondary | ICD-10-CM | POA: Diagnosis not present

## 2020-04-19 DIAGNOSIS — D688 Other specified coagulation defects: Secondary | ICD-10-CM | POA: Diagnosis not present

## 2020-04-19 DIAGNOSIS — J1282 Pneumonia due to coronavirus disease 2019: Secondary | ICD-10-CM | POA: Diagnosis not present

## 2020-04-19 DIAGNOSIS — Z9981 Dependence on supplemental oxygen: Secondary | ICD-10-CM | POA: Diagnosis not present

## 2020-04-19 DIAGNOSIS — J9601 Acute respiratory failure with hypoxia: Secondary | ICD-10-CM | POA: Diagnosis not present

## 2020-04-20 DIAGNOSIS — J1282 Pneumonia due to coronavirus disease 2019: Secondary | ICD-10-CM | POA: Diagnosis not present

## 2020-04-20 DIAGNOSIS — U071 COVID-19: Secondary | ICD-10-CM | POA: Diagnosis not present

## 2020-04-20 DIAGNOSIS — Z9981 Dependence on supplemental oxygen: Secondary | ICD-10-CM | POA: Diagnosis not present

## 2020-04-20 DIAGNOSIS — J9601 Acute respiratory failure with hypoxia: Secondary | ICD-10-CM | POA: Diagnosis not present

## 2020-04-21 DIAGNOSIS — J811 Chronic pulmonary edema: Secondary | ICD-10-CM | POA: Diagnosis not present

## 2020-04-21 DIAGNOSIS — J9601 Acute respiratory failure with hypoxia: Secondary | ICD-10-CM | POA: Diagnosis not present

## 2020-04-21 DIAGNOSIS — Z9981 Dependence on supplemental oxygen: Secondary | ICD-10-CM | POA: Diagnosis not present

## 2020-04-21 DIAGNOSIS — U071 COVID-19: Secondary | ICD-10-CM | POA: Diagnosis not present

## 2020-04-21 DIAGNOSIS — J1282 Pneumonia due to coronavirus disease 2019: Secondary | ICD-10-CM | POA: Diagnosis not present

## 2020-04-21 DIAGNOSIS — R0902 Hypoxemia: Secondary | ICD-10-CM | POA: Diagnosis not present

## 2020-04-21 DIAGNOSIS — J90 Pleural effusion, not elsewhere classified: Secondary | ICD-10-CM | POA: Diagnosis not present

## 2020-04-22 DIAGNOSIS — J9601 Acute respiratory failure with hypoxia: Secondary | ICD-10-CM | POA: Diagnosis not present

## 2020-04-22 DIAGNOSIS — Z9981 Dependence on supplemental oxygen: Secondary | ICD-10-CM | POA: Diagnosis not present

## 2020-04-22 DIAGNOSIS — J1282 Pneumonia due to coronavirus disease 2019: Secondary | ICD-10-CM | POA: Diagnosis not present

## 2020-04-22 DIAGNOSIS — U071 COVID-19: Secondary | ICD-10-CM | POA: Diagnosis not present

## 2020-04-23 DIAGNOSIS — U071 COVID-19: Secondary | ICD-10-CM | POA: Diagnosis not present

## 2020-04-23 DIAGNOSIS — J9601 Acute respiratory failure with hypoxia: Secondary | ICD-10-CM | POA: Diagnosis not present

## 2020-04-23 DIAGNOSIS — J1282 Pneumonia due to coronavirus disease 2019: Secondary | ICD-10-CM | POA: Diagnosis not present

## 2020-04-23 DIAGNOSIS — Z9911 Dependence on respirator [ventilator] status: Secondary | ICD-10-CM | POA: Diagnosis not present

## 2020-04-24 DIAGNOSIS — J9601 Acute respiratory failure with hypoxia: Secondary | ICD-10-CM | POA: Diagnosis not present

## 2020-04-24 DIAGNOSIS — J1282 Pneumonia due to coronavirus disease 2019: Secondary | ICD-10-CM | POA: Diagnosis not present

## 2020-04-24 DIAGNOSIS — Z9981 Dependence on supplemental oxygen: Secondary | ICD-10-CM | POA: Diagnosis not present

## 2020-04-24 DIAGNOSIS — U071 COVID-19: Secondary | ICD-10-CM | POA: Diagnosis not present

## 2020-04-25 DIAGNOSIS — J1282 Pneumonia due to coronavirus disease 2019: Secondary | ICD-10-CM | POA: Diagnosis not present

## 2020-04-25 DIAGNOSIS — U071 COVID-19: Secondary | ICD-10-CM | POA: Diagnosis not present

## 2020-04-25 DIAGNOSIS — J9601 Acute respiratory failure with hypoxia: Secondary | ICD-10-CM | POA: Diagnosis not present

## 2020-04-25 DIAGNOSIS — Z9981 Dependence on supplemental oxygen: Secondary | ICD-10-CM | POA: Diagnosis not present

## 2020-04-26 DIAGNOSIS — U071 COVID-19: Secondary | ICD-10-CM | POA: Diagnosis not present

## 2020-04-26 DIAGNOSIS — R110 Nausea: Secondary | ICD-10-CM | POA: Diagnosis not present

## 2020-04-26 DIAGNOSIS — Z515 Encounter for palliative care: Secondary | ICD-10-CM | POA: Diagnosis not present

## 2020-04-26 DIAGNOSIS — J9601 Acute respiratory failure with hypoxia: Secondary | ICD-10-CM | POA: Diagnosis not present

## 2020-04-26 DIAGNOSIS — R059 Cough, unspecified: Secondary | ICD-10-CM | POA: Diagnosis not present

## 2020-04-26 DIAGNOSIS — R439 Unspecified disturbances of smell and taste: Secondary | ICD-10-CM | POA: Diagnosis not present

## 2020-04-26 DIAGNOSIS — Z9981 Dependence on supplemental oxygen: Secondary | ICD-10-CM | POA: Diagnosis not present

## 2020-04-26 DIAGNOSIS — R197 Diarrhea, unspecified: Secondary | ICD-10-CM | POA: Diagnosis not present

## 2020-04-26 DIAGNOSIS — J1282 Pneumonia due to coronavirus disease 2019: Secondary | ICD-10-CM | POA: Diagnosis not present

## 2020-04-27 DIAGNOSIS — R439 Unspecified disturbances of smell and taste: Secondary | ICD-10-CM | POA: Diagnosis not present

## 2020-04-27 DIAGNOSIS — J1282 Pneumonia due to coronavirus disease 2019: Secondary | ICD-10-CM | POA: Diagnosis not present

## 2020-04-27 DIAGNOSIS — Z9981 Dependence on supplemental oxygen: Secondary | ICD-10-CM | POA: Diagnosis not present

## 2020-04-27 DIAGNOSIS — J9601 Acute respiratory failure with hypoxia: Secondary | ICD-10-CM | POA: Diagnosis not present

## 2020-04-27 DIAGNOSIS — R110 Nausea: Secondary | ICD-10-CM | POA: Diagnosis not present

## 2020-04-27 DIAGNOSIS — Z515 Encounter for palliative care: Secondary | ICD-10-CM | POA: Diagnosis not present

## 2020-04-27 DIAGNOSIS — U071 COVID-19: Secondary | ICD-10-CM | POA: Diagnosis not present

## 2020-04-27 DIAGNOSIS — R059 Cough, unspecified: Secondary | ICD-10-CM | POA: Diagnosis not present

## 2020-04-28 DIAGNOSIS — J9601 Acute respiratory failure with hypoxia: Secondary | ICD-10-CM | POA: Diagnosis not present

## 2020-04-28 DIAGNOSIS — R439 Unspecified disturbances of smell and taste: Secondary | ICD-10-CM | POA: Diagnosis not present

## 2020-04-28 DIAGNOSIS — J80 Acute respiratory distress syndrome: Secondary | ICD-10-CM | POA: Diagnosis not present

## 2020-04-28 DIAGNOSIS — R110 Nausea: Secondary | ICD-10-CM | POA: Diagnosis not present

## 2020-04-28 DIAGNOSIS — J1282 Pneumonia due to coronavirus disease 2019: Secondary | ICD-10-CM | POA: Diagnosis not present

## 2020-04-28 DIAGNOSIS — Z515 Encounter for palliative care: Secondary | ICD-10-CM | POA: Diagnosis not present

## 2020-04-28 DIAGNOSIS — R059 Cough, unspecified: Secondary | ICD-10-CM | POA: Diagnosis not present

## 2020-04-28 DIAGNOSIS — U071 COVID-19: Secondary | ICD-10-CM | POA: Diagnosis not present

## 2020-04-28 DIAGNOSIS — Z9981 Dependence on supplemental oxygen: Secondary | ICD-10-CM | POA: Diagnosis not present

## 2020-04-29 DIAGNOSIS — R059 Cough, unspecified: Secondary | ICD-10-CM | POA: Diagnosis not present

## 2020-04-29 DIAGNOSIS — R197 Diarrhea, unspecified: Secondary | ICD-10-CM | POA: Diagnosis not present

## 2020-04-29 DIAGNOSIS — R110 Nausea: Secondary | ICD-10-CM | POA: Diagnosis not present

## 2020-04-29 DIAGNOSIS — U071 COVID-19: Secondary | ICD-10-CM | POA: Diagnosis not present

## 2020-04-29 DIAGNOSIS — Z9981 Dependence on supplemental oxygen: Secondary | ICD-10-CM | POA: Diagnosis not present

## 2020-04-29 DIAGNOSIS — J1282 Pneumonia due to coronavirus disease 2019: Secondary | ICD-10-CM | POA: Diagnosis not present

## 2020-04-29 DIAGNOSIS — Z515 Encounter for palliative care: Secondary | ICD-10-CM | POA: Diagnosis not present

## 2020-04-29 DIAGNOSIS — J9601 Acute respiratory failure with hypoxia: Secondary | ICD-10-CM | POA: Diagnosis not present

## 2020-04-29 DIAGNOSIS — R439 Unspecified disturbances of smell and taste: Secondary | ICD-10-CM | POA: Diagnosis not present

## 2020-04-30 ENCOUNTER — Ambulatory Visit: Admit: 2020-04-30 | Payer: MEDICARE | Attending: Internal Medicine | Primary: Internal Medicine

## 2020-04-30 DIAGNOSIS — J1282 Pneumonia due to coronavirus disease 2019: Secondary | ICD-10-CM | POA: Diagnosis not present

## 2020-04-30 DIAGNOSIS — R110 Nausea: Secondary | ICD-10-CM | POA: Diagnosis not present

## 2020-04-30 DIAGNOSIS — U071 COVID-19: Secondary | ICD-10-CM | POA: Diagnosis not present

## 2020-04-30 DIAGNOSIS — R059 Cough, unspecified: Secondary | ICD-10-CM | POA: Diagnosis not present

## 2020-04-30 DIAGNOSIS — Z515 Encounter for palliative care: Secondary | ICD-10-CM | POA: Diagnosis not present

## 2020-04-30 DIAGNOSIS — J9601 Acute respiratory failure with hypoxia: Secondary | ICD-10-CM | POA: Diagnosis not present

## 2020-04-30 DIAGNOSIS — R439 Unspecified disturbances of smell and taste: Secondary | ICD-10-CM | POA: Diagnosis not present

## 2020-04-30 DIAGNOSIS — Z9981 Dependence on supplemental oxygen: Secondary | ICD-10-CM | POA: Diagnosis not present

## 2020-05-01 DIAGNOSIS — U071 COVID-19: Secondary | ICD-10-CM | POA: Diagnosis not present

## 2020-05-01 DIAGNOSIS — J1282 Pneumonia due to coronavirus disease 2019: Secondary | ICD-10-CM | POA: Diagnosis not present

## 2020-05-01 DIAGNOSIS — Z9981 Dependence on supplemental oxygen: Secondary | ICD-10-CM | POA: Diagnosis not present

## 2020-05-01 DIAGNOSIS — J9601 Acute respiratory failure with hypoxia: Secondary | ICD-10-CM | POA: Diagnosis not present

## 2020-05-02 DIAGNOSIS — J9811 Atelectasis: Secondary | ICD-10-CM | POA: Diagnosis not present

## 2020-05-02 DIAGNOSIS — J9601 Acute respiratory failure with hypoxia: Secondary | ICD-10-CM | POA: Diagnosis not present

## 2020-05-02 DIAGNOSIS — J1282 Pneumonia due to coronavirus disease 2019: Secondary | ICD-10-CM | POA: Diagnosis not present

## 2020-05-02 DIAGNOSIS — U071 COVID-19: Secondary | ICD-10-CM | POA: Diagnosis not present

## 2020-05-02 DIAGNOSIS — J90 Pleural effusion, not elsewhere classified: Secondary | ICD-10-CM | POA: Diagnosis not present

## 2020-05-02 DIAGNOSIS — Z9981 Dependence on supplemental oxygen: Secondary | ICD-10-CM | POA: Diagnosis not present

## 2020-05-02 DIAGNOSIS — R918 Other nonspecific abnormal finding of lung field: Secondary | ICD-10-CM | POA: Diagnosis not present

## 2020-05-03 DIAGNOSIS — J9601 Acute respiratory failure with hypoxia: Secondary | ICD-10-CM | POA: Diagnosis not present

## 2020-05-03 DIAGNOSIS — R197 Diarrhea, unspecified: Secondary | ICD-10-CM | POA: Diagnosis not present

## 2020-05-03 DIAGNOSIS — R439 Unspecified disturbances of smell and taste: Secondary | ICD-10-CM | POA: Diagnosis not present

## 2020-05-03 DIAGNOSIS — Z515 Encounter for palliative care: Secondary | ICD-10-CM | POA: Diagnosis not present

## 2020-05-03 DIAGNOSIS — R110 Nausea: Secondary | ICD-10-CM | POA: Diagnosis not present

## 2020-05-03 DIAGNOSIS — R059 Cough, unspecified: Secondary | ICD-10-CM | POA: Diagnosis not present

## 2020-05-04 DIAGNOSIS — J9601 Acute respiratory failure with hypoxia: Secondary | ICD-10-CM | POA: Diagnosis not present

## 2020-05-06 DIAGNOSIS — Z515 Encounter for palliative care: Secondary | ICD-10-CM | POA: Diagnosis not present

## 2020-05-06 DIAGNOSIS — R110 Nausea: Secondary | ICD-10-CM | POA: Diagnosis not present

## 2020-05-06 DIAGNOSIS — R918 Other nonspecific abnormal finding of lung field: Secondary | ICD-10-CM | POA: Diagnosis not present

## 2020-05-06 DIAGNOSIS — R0902 Hypoxemia: Secondary | ICD-10-CM | POA: Diagnosis not present

## 2020-05-06 DIAGNOSIS — R059 Cough, unspecified: Secondary | ICD-10-CM | POA: Diagnosis not present

## 2020-05-07 DIAGNOSIS — Z7189 Other specified counseling: Secondary | ICD-10-CM | POA: Diagnosis not present

## 2020-05-07 DIAGNOSIS — R0600 Dyspnea, unspecified: Secondary | ICD-10-CM | POA: Diagnosis not present

## 2020-05-07 DIAGNOSIS — R059 Cough, unspecified: Secondary | ICD-10-CM | POA: Diagnosis not present

## 2020-05-07 DIAGNOSIS — R110 Nausea: Secondary | ICD-10-CM | POA: Diagnosis not present

## 2020-05-07 DIAGNOSIS — Z515 Encounter for palliative care: Secondary | ICD-10-CM | POA: Diagnosis not present

## 2020-05-08 DIAGNOSIS — J9690 Respiratory failure, unspecified, unspecified whether with hypoxia or hypercapnia: Principal | ICD-10-CM

## 2020-05-09 ENCOUNTER — Encounter: Admit: 2020-05-09 | Discharge: 2020-05-13 | Payer: MEDICARE

## 2020-05-09 ENCOUNTER — Ambulatory Visit: Admit: 2020-05-09 | Discharge: 2020-05-21 | Disposition: E | Payer: MEDICARE | Source: Other Acute Inpatient Hospital

## 2020-05-09 DIAGNOSIS — Z743 Need for continuous supervision: Secondary | ICD-10-CM | POA: Diagnosis not present

## 2020-05-09 DIAGNOSIS — M6250 Muscle wasting and atrophy, not elsewhere classified, unspecified site: Secondary | ICD-10-CM | POA: Diagnosis not present

## 2020-05-09 DIAGNOSIS — R627 Adult failure to thrive: Secondary | ICD-10-CM | POA: Diagnosis not present

## 2020-05-09 DIAGNOSIS — Z9911 Dependence on respirator [ventilator] status: Secondary | ICD-10-CM | POA: Diagnosis not present

## 2020-05-09 MED ORDER — CLONAZEPAM 0.5 MG TABLET
Freq: Two times a day (BID) | ORAL | 0 refills | 0 days | Status: SS | PRN
Start: 2020-05-09 — End: 2020-05-14

## 2020-05-09 MED ORDER — SERTRALINE 100 MG TABLET
ORAL_TABLET | Freq: Every day | ORAL | 0 refills | 30 days | Status: SS
Start: 2020-05-09 — End: 2020-06-08

## 2020-05-09 MED ORDER — DRONABINOL 5 MG CAPSULE
ORAL_CAPSULE | Freq: Two times a day (BID) | ORAL | 0 refills | 5.00000 days | Status: SS
Start: 2020-05-09 — End: 2020-05-14

## 2020-05-09 MED ORDER — PANTOPRAZOLE 40 MG TABLET,DELAYED RELEASE
ORAL_TABLET | Freq: Every day | ORAL | 0 refills | 30 days | Status: SS
Start: 2020-05-09 — End: 2020-06-08

## 2020-05-09 MED ORDER — ACETAMINOPHEN 325 MG TABLET
Freq: Four times a day (QID) | ORAL | 0 refills | 0 days | Status: SS | PRN
Start: 2020-05-09 — End: ?

## 2020-05-09 MED ORDER — GABAPENTIN 300 MG CAPSULE
ORAL_CAPSULE | Freq: Every evening | ORAL | 0 refills | 30 days | Status: SS
Start: 2020-05-09 — End: 2020-06-08

## 2020-05-09 MED ORDER — BISACODYL 10 MG RECTAL SUPPOSITORY
Freq: Every day | RECTAL | 0 refills | 30 days | Status: SS | PRN
Start: 2020-05-09 — End: 2020-06-08

## 2020-05-09 MED ORDER — DEXTROMETHORPHAN POLISTIREX ER 30 MG/5 ML ORAL SUSP EXT.RELEASE 12HR
Freq: Two times a day (BID) | ORAL | 0 refills | 4 days | Status: SS
Start: 2020-05-09 — End: ?

## 2020-05-09 MED ORDER — MORPHINE 10 MG/5 ML ORAL SOLUTION
Freq: Four times a day (QID) | ORAL | 0 refills | 0.00000 days | Status: SS | PRN
Start: 2020-05-09 — End: 2020-05-14

## 2020-05-09 MED ORDER — POLYETHYLENE GLYCOL 3350 17 GRAM ORAL POWDER PACKET
PACK | Freq: Three times a day (TID) | ORAL | 0 refills | 30.00000 days | Status: SS
Start: 2020-05-09 — End: 2020-06-08

## 2020-05-09 MED ORDER — MELATONIN 3 MG TABLET
Freq: Every evening | ORAL | 0 refills | 0 days | Status: SS
Start: 2020-05-09 — End: ?

## 2020-05-09 MED ORDER — HYDROCODONE-HOMATROPINE 5 MG-1.5 MG/5 ML (5 ML) ORAL SYRUP
ORAL | 0 refills | 0 days | Status: SS | PRN
Start: 2020-05-09 — End: 2020-05-14

## 2020-05-09 MED ORDER — PREDNISONE 5 MG TABLET
Freq: Every day | ORAL | 0 refills | 0.00000 days | Status: SS
Start: 2020-05-09 — End: ?

## 2020-05-09 MED ORDER — SENNOSIDES 8.6 MG TABLET
ORAL_TABLET | Freq: Two times a day (BID) | ORAL | 0 refills | 30 days | Status: SS
Start: 2020-05-09 — End: 2020-06-08

## 2020-05-09 MED ORDER — ONDANSETRON HCL (PF) 4 MG/2 ML INJECTION SOLUTION
Freq: Three times a day (TID) | INTRAVENOUS | 0 refills | 2 days | Status: SS | PRN
Start: 2020-05-09 — End: 2020-06-08

## 2020-05-10 DIAGNOSIS — J9690 Respiratory failure, unspecified, unspecified whether with hypoxia or hypercapnia: Principal | ICD-10-CM

## 2020-05-11 ENCOUNTER — Ambulatory Visit: Admit: 2020-05-11 | Payer: MEDICARE | Attending: Cardiovascular Disease | Primary: Cardiovascular Disease

## 2020-05-12 ENCOUNTER — Telehealth: Payer: Self-pay | Admitting: Internal Medicine

## 2020-05-12 NOTE — Progress Notes (Signed)
  Chronic Care Management   Outreach Note  05/12/2020 Name: Laura Chen MRN: [id number] DOB: [birth year]  Referred by: Lavera Guise, MD Reason for referral : No chief complaint on file.   An unsuccessful telephone outreach was attempted today. The patient was referred to the pharmacist for assistance with care management and care coordination.   Follow Up Plan:   Carley Perdue [employer] Scheduler

## 2020-05-17 DIAGNOSIS — F418 Other specified anxiety disorders: Principal | ICD-10-CM

## 2020-05-17 DIAGNOSIS — M329 Systemic lupus erythematosus, unspecified: Principal | ICD-10-CM

## 2020-05-17 DIAGNOSIS — C9210 Chronic myeloid leukemia, BCR/ABL-positive, not having achieved remission: Principal | ICD-10-CM

## 2020-05-17 DIAGNOSIS — J9621 Acute and chronic respiratory failure with hypoxia: Principal | ICD-10-CM

## 2020-05-17 DIAGNOSIS — Z452 Encounter for adjustment and management of vascular access device: Principal | ICD-10-CM

## 2020-05-17 DIAGNOSIS — J45909 Unspecified asthma, uncomplicated: Principal | ICD-10-CM

## 2020-05-17 DIAGNOSIS — I110 Hypertensive heart disease with heart failure: Principal | ICD-10-CM

## 2020-05-17 DIAGNOSIS — D509 Iron deficiency anemia, unspecified: Principal | ICD-10-CM

## 2020-05-17 DIAGNOSIS — S31000D Unspecified open wound of lower back and pelvis without penetration into retroperitoneum, subsequent encounter: Principal | ICD-10-CM

## 2020-05-17 DIAGNOSIS — E440 Moderate protein-calorie malnutrition: Principal | ICD-10-CM

## 2020-05-17 DIAGNOSIS — Z66 Do not resuscitate: Principal | ICD-10-CM

## 2020-05-17 DIAGNOSIS — D6869 Other thrombophilia: Principal | ICD-10-CM

## 2020-05-17 DIAGNOSIS — R911 Solitary pulmonary nodule: Principal | ICD-10-CM

## 2020-05-17 DIAGNOSIS — E669 Obesity, unspecified: Principal | ICD-10-CM

## 2020-05-17 DIAGNOSIS — I5030 Unspecified diastolic (congestive) heart failure: Principal | ICD-10-CM

## 2020-05-17 DIAGNOSIS — U099 Post covid-19 condition, unspecified: Principal | ICD-10-CM

## 2020-05-17 DIAGNOSIS — E119 Type 2 diabetes mellitus without complications: Principal | ICD-10-CM

## 2020-05-17 DIAGNOSIS — Z6841 Body Mass Index (BMI) 40.0 and over, adult: Principal | ICD-10-CM

## 2020-05-17 DIAGNOSIS — R630 Anorexia: Principal | ICD-10-CM

## 2020-05-17 DIAGNOSIS — N179 Acute kidney failure, unspecified: Principal | ICD-10-CM

## 2020-05-17 DIAGNOSIS — D849 Immunodeficiency, unspecified: Principal | ICD-10-CM

## 2020-05-17 DIAGNOSIS — Z9981 Dependence on supplemental oxygen: Principal | ICD-10-CM

## 2020-05-17 DIAGNOSIS — M059 Rheumatoid arthritis with rheumatoid factor, unspecified: Principal | ICD-10-CM

## 2020-05-21 DEATH — deceased

## 2020-07-06 ENCOUNTER — Ambulatory Visit: Payer: Medicare Other | Admitting: Internal Medicine

## 2020-08-04 ENCOUNTER — Telehealth: Payer: Self-pay

## 2020-08-04 NOTE — Telephone Encounter (Signed)
Completed medical records for [facility] Mailed to [address] [id number] Faxed payment request $59.75 (for 6 pts total) to [phone] (P) [phone]

## 2021-01-18 ENCOUNTER — Encounter: Payer: Medicare Other | Admitting: Nurse Practitioner

## 2022-02-20 IMAGING — CR DG CHEST 2V
1 series · 2 of 2 positions shown · non-contrast
Comparison: 06/10/2019 and prior.

CLINICAL DATA: SOB, CP

EXAM:
CHEST - 2 VIEW

[Series 1: dg chest 2 view · 0.14mm/px · 2 of 2 slices shown]
[im 1/2]
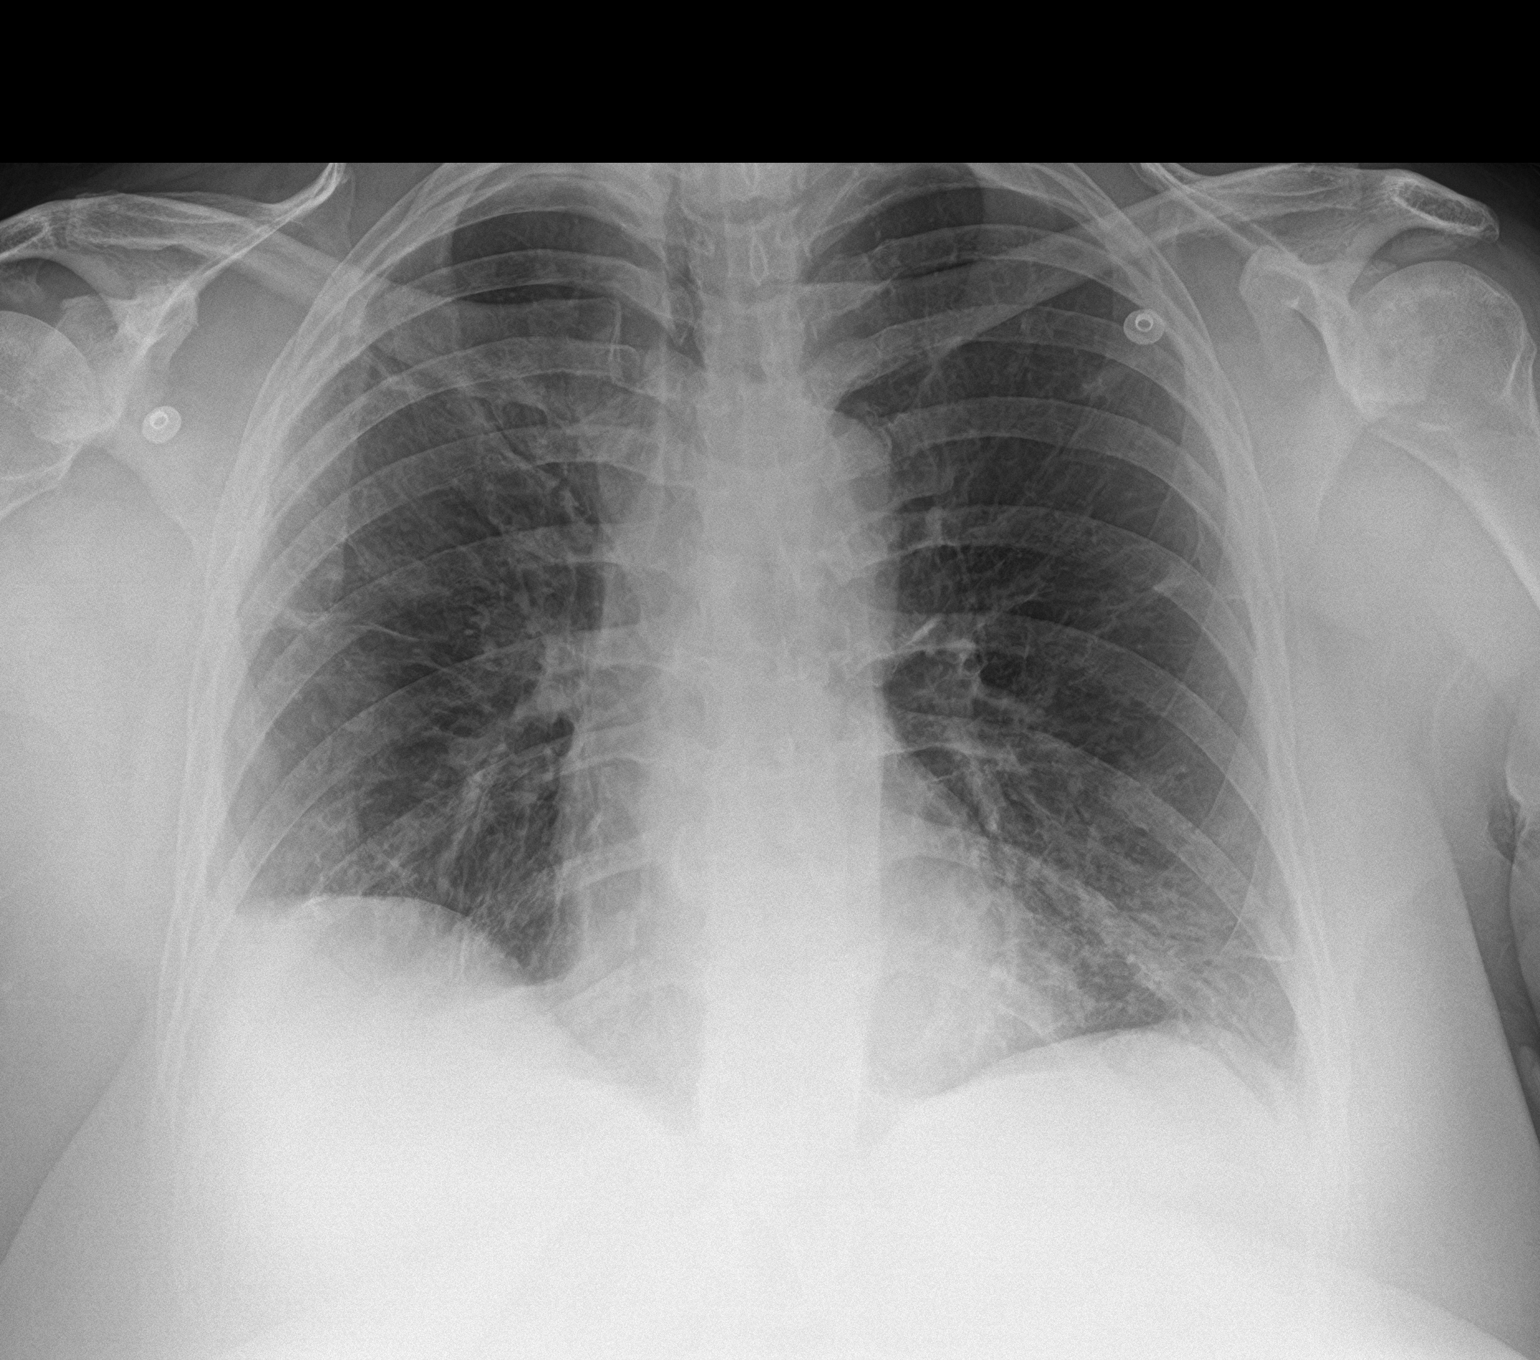
[im 2/2]
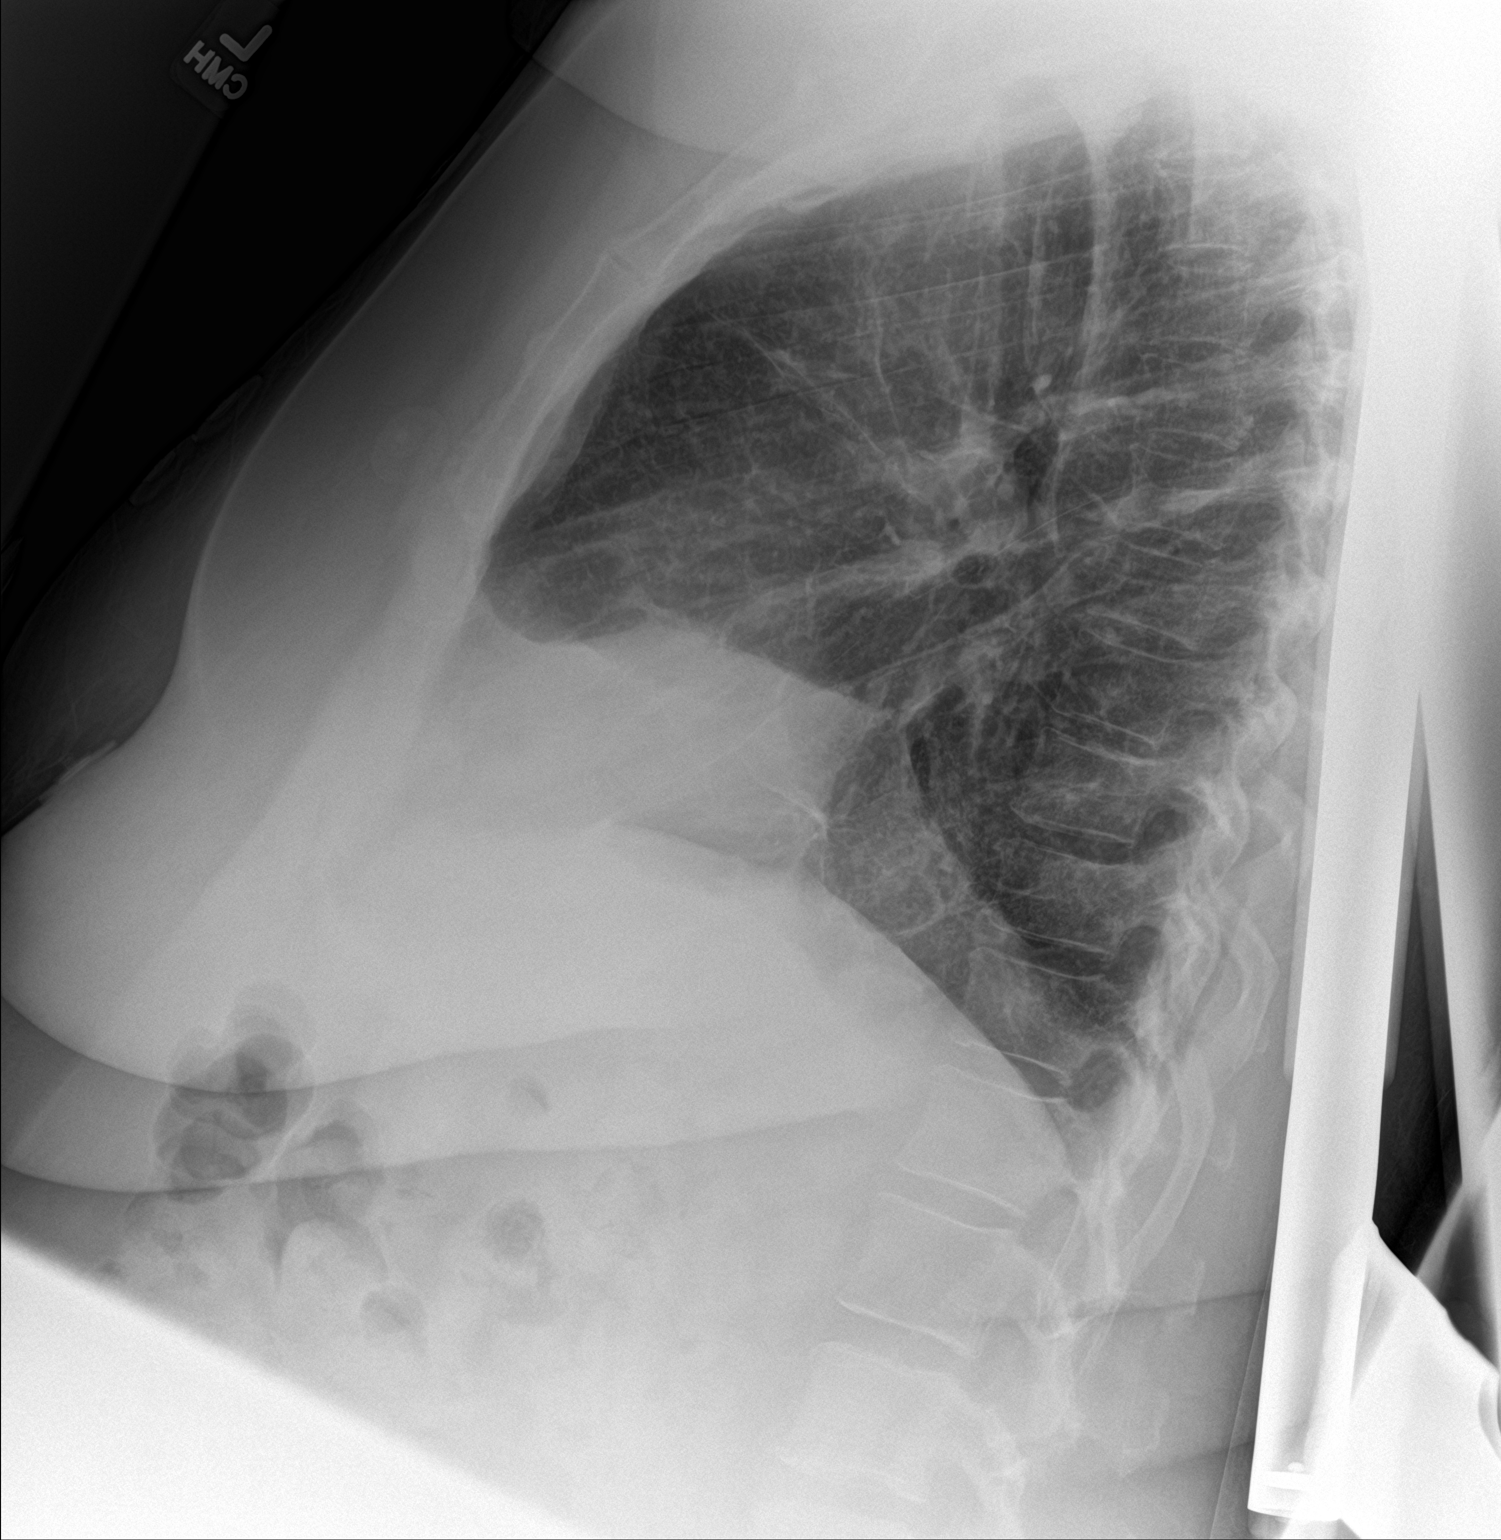

[2 of 2 positions shown; findings below may reference images not displayed]

FINDINGS: No pneumothorax. Unchanged linear left lung opacities, scarring
versus atelectasis. Chronic right hemidiaphragm elevation and
blunting of the right costophrenic sulcus.

No new focal consolidation. Mild chronic bronchitic changes. Stable
cardiomediastinal silhouette.
IMPRESSION: Mild chronic bronchitic changes without new focal consolidation.

Chronic scarring and blunting of the right costophrenic sulcus,
unchanged.
